# Patient Record
Sex: Female | Born: 1945 | Race: White | Hispanic: No | Smoking: Never smoker
Health system: Southern US, Community
[De-identification: ages and names within clinical notes are randomized; demographics above are authoritative.]

## PROBLEM LIST (undated history)

## (undated) DIAGNOSIS — E785 Hyperlipidemia, unspecified: Secondary | ICD-10-CM

## (undated) DIAGNOSIS — I10 Essential (primary) hypertension: Secondary | ICD-10-CM

## (undated) DIAGNOSIS — R011 Cardiac murmur, unspecified: Secondary | ICD-10-CM

## (undated) HISTORY — PX: ABDOMINAL HYSTERECTOMY: SHX81

## (undated) HISTORY — DX: Hyperlipidemia, unspecified: E78.5

## (undated) HISTORY — PX: CHOLECYSTECTOMY: SHX55

## (undated) HISTORY — PX: TOTAL KNEE ARTHROPLASTY: SHX125

## (undated) HISTORY — PX: TONSILLECTOMY: SUR1361

## (undated) HISTORY — DX: Essential (primary) hypertension: I10

## (undated) HISTORY — PX: BACK SURGERY: SHX140

## (undated) HISTORY — DX: Cardiac murmur, unspecified: R01.1

---

## 1998-01-11 ENCOUNTER — Ambulatory Visit (HOSPITAL_BASED_OUTPATIENT_CLINIC_OR_DEPARTMENT_OTHER): Admission: RE | Admit: 1998-01-11 | Discharge: 1998-01-11 | Payer: Self-pay | Admitting: Orthopedic Surgery

## 1999-10-16 ENCOUNTER — Other Ambulatory Visit: Admission: RE | Admit: 1999-10-16 | Discharge: 1999-10-16 | Payer: Self-pay | Admitting: *Deleted

## 2000-11-18 ENCOUNTER — Other Ambulatory Visit: Admission: RE | Admit: 2000-11-18 | Discharge: 2000-11-18 | Payer: Self-pay | Admitting: *Deleted

## 2001-12-13 ENCOUNTER — Other Ambulatory Visit: Admission: RE | Admit: 2001-12-13 | Discharge: 2001-12-13 | Payer: Self-pay | Admitting: *Deleted

## 2002-05-03 ENCOUNTER — Emergency Department (HOSPITAL_COMMUNITY): Admission: EM | Admit: 2002-05-03 | Discharge: 2002-05-03 | Payer: Self-pay | Admitting: Emergency Medicine

## 2003-01-30 ENCOUNTER — Other Ambulatory Visit: Admission: RE | Admit: 2003-01-30 | Discharge: 2003-01-30 | Payer: Self-pay | Admitting: *Deleted

## 2003-09-24 ENCOUNTER — Emergency Department (HOSPITAL_COMMUNITY): Admission: EM | Admit: 2003-09-24 | Discharge: 2003-09-24 | Payer: Self-pay

## 2004-06-11 ENCOUNTER — Other Ambulatory Visit: Admission: RE | Admit: 2004-06-11 | Discharge: 2004-06-11 | Payer: Self-pay | Admitting: *Deleted

## 2004-06-11 ENCOUNTER — Ambulatory Visit: Payer: Self-pay | Admitting: Internal Medicine

## 2004-07-31 ENCOUNTER — Other Ambulatory Visit: Admission: RE | Admit: 2004-07-31 | Discharge: 2004-07-31 | Payer: Self-pay | Admitting: *Deleted

## 2004-09-26 ENCOUNTER — Ambulatory Visit: Payer: Self-pay | Admitting: Internal Medicine

## 2005-02-18 ENCOUNTER — Other Ambulatory Visit: Admission: RE | Admit: 2005-02-18 | Discharge: 2005-02-18 | Payer: Self-pay | Admitting: *Deleted

## 2005-05-27 ENCOUNTER — Ambulatory Visit: Payer: Self-pay | Admitting: Internal Medicine

## 2005-06-16 ENCOUNTER — Ambulatory Visit: Payer: Self-pay | Admitting: Family Medicine

## 2005-06-18 ENCOUNTER — Other Ambulatory Visit: Admission: RE | Admit: 2005-06-18 | Discharge: 2005-06-18 | Payer: Self-pay | Admitting: *Deleted

## 2005-11-20 ENCOUNTER — Other Ambulatory Visit: Admission: RE | Admit: 2005-11-20 | Discharge: 2005-11-20 | Payer: Self-pay | Admitting: *Deleted

## 2006-06-23 ENCOUNTER — Ambulatory Visit: Payer: Self-pay | Admitting: Family Medicine

## 2006-06-23 LAB — CONVERTED CEMR LAB
BUN: 9 mg/dL (ref 6–23)
Basophils Absolute: 0.1 10*3/uL (ref 0.0–0.1)
CO2: 28 meq/L (ref 19–32)
Calcium: 9.4 mg/dL (ref 8.4–10.5)
Creatinine, Ser: 0.7 mg/dL (ref 0.4–1.2)
HDL: 36.6 mg/dL — ABNORMAL LOW (ref >39.0)
Hemoglobin: 14.4 g/dL (ref 12.0–15.0)
Lymphocytes Relative: 28.3 % (ref 12.0–46.0)
MCHC: 33.6 g/dL (ref 30.0–36.0)
Monocytes Relative: 6.4 % (ref 3.0–11.0)
Neutro Abs: 3.2 10*3/uL (ref 1.4–7.7)
Neutrophils Relative %: 61.4 % (ref 43.0–77.0)
TSH: 2.26 microintl units/mL (ref 0.35–5.50)
Triglyceride fasting, serum: 172 mg/dL — ABNORMAL HIGH (ref 0–149)

## 2007-02-10 ENCOUNTER — Encounter: Payer: Self-pay | Admitting: Family Medicine

## 2007-02-10 ENCOUNTER — Other Ambulatory Visit: Admission: RE | Admit: 2007-02-10 | Discharge: 2007-02-10 | Payer: Self-pay | Admitting: *Deleted

## 2007-08-09 ENCOUNTER — Other Ambulatory Visit: Admission: RE | Admit: 2007-08-09 | Discharge: 2007-08-09 | Payer: Self-pay | Admitting: *Deleted

## 2007-12-31 ENCOUNTER — Ambulatory Visit: Payer: Self-pay | Admitting: Family Medicine

## 2007-12-31 DIAGNOSIS — R609 Edema, unspecified: Secondary | ICD-10-CM

## 2007-12-31 DIAGNOSIS — G4700 Insomnia, unspecified: Secondary | ICD-10-CM

## 2007-12-31 DIAGNOSIS — R011 Cardiac murmur, unspecified: Secondary | ICD-10-CM

## 2008-01-03 ENCOUNTER — Encounter: Payer: Self-pay | Admitting: Family Medicine

## 2008-01-03 ENCOUNTER — Encounter (INDEPENDENT_AMBULATORY_CARE_PROVIDER_SITE_OTHER): Payer: Self-pay | Admitting: *Deleted

## 2008-01-10 ENCOUNTER — Ambulatory Visit: Payer: Self-pay

## 2008-01-10 ENCOUNTER — Encounter: Payer: Self-pay | Admitting: Family Medicine

## 2008-01-17 ENCOUNTER — Ambulatory Visit: Payer: Self-pay | Admitting: Family Medicine

## 2008-01-17 DIAGNOSIS — R079 Chest pain, unspecified: Secondary | ICD-10-CM | POA: Insufficient documentation

## 2008-01-17 DIAGNOSIS — I10 Essential (primary) hypertension: Secondary | ICD-10-CM

## 2008-01-18 ENCOUNTER — Encounter (INDEPENDENT_AMBULATORY_CARE_PROVIDER_SITE_OTHER): Payer: Self-pay | Admitting: *Deleted

## 2008-01-18 ENCOUNTER — Telehealth (INDEPENDENT_AMBULATORY_CARE_PROVIDER_SITE_OTHER): Payer: Self-pay | Admitting: *Deleted

## 2008-01-20 ENCOUNTER — Encounter (INDEPENDENT_AMBULATORY_CARE_PROVIDER_SITE_OTHER): Payer: Self-pay | Admitting: *Deleted

## 2008-01-20 LAB — CONVERTED CEMR LAB
CO2: 28 meq/L (ref 19–32)
Calcium: 9.4 mg/dL (ref 8.4–10.5)
Creatinine, Ser: 0.9 mg/dL (ref 0.4–1.2)
Glucose, Bld: 183 mg/dL — ABNORMAL HIGH (ref 70–99)
Sodium: 136 meq/L (ref 135–145)

## 2008-01-25 ENCOUNTER — Ambulatory Visit: Payer: Self-pay | Admitting: Cardiology

## 2008-01-31 ENCOUNTER — Ambulatory Visit: Payer: Self-pay

## 2008-01-31 ENCOUNTER — Encounter: Payer: Self-pay | Admitting: Family Medicine

## 2008-02-07 ENCOUNTER — Ambulatory Visit: Payer: Self-pay | Admitting: Family Medicine

## 2008-02-08 ENCOUNTER — Telehealth (INDEPENDENT_AMBULATORY_CARE_PROVIDER_SITE_OTHER): Payer: Self-pay | Admitting: *Deleted

## 2008-05-08 ENCOUNTER — Encounter (INDEPENDENT_AMBULATORY_CARE_PROVIDER_SITE_OTHER): Payer: Self-pay | Admitting: *Deleted

## 2008-05-08 ENCOUNTER — Ambulatory Visit: Payer: Self-pay | Admitting: Family Medicine

## 2008-05-08 DIAGNOSIS — R7309 Other abnormal glucose: Secondary | ICD-10-CM | POA: Insufficient documentation

## 2008-05-09 ENCOUNTER — Telehealth (INDEPENDENT_AMBULATORY_CARE_PROVIDER_SITE_OTHER): Payer: Self-pay | Admitting: *Deleted

## 2008-05-12 ENCOUNTER — Encounter (INDEPENDENT_AMBULATORY_CARE_PROVIDER_SITE_OTHER): Payer: Self-pay | Admitting: *Deleted

## 2008-05-12 LAB — CONVERTED CEMR LAB
ALT: 18 units/L (ref 0–35)
Bilirubin, Direct: 0.1 mg/dL (ref 0.0–0.3)
CO2: 29 meq/L (ref 19–32)
Calcium: 9.1 mg/dL (ref 8.4–10.5)
Direct LDL: 130.2 mg/dL
GFR calc non Af Amer: 78 mL/min
Sodium: 140 meq/L (ref 135–145)
TSH: 4.18 microintl units/mL (ref 0.35–5.50)
Total Bilirubin: 0.7 mg/dL (ref 0.3–1.2)
Total CHOL/HDL Ratio: 7
Triglycerides: 218 mg/dL (ref 0–149)
VLDL: 44 mg/dL — ABNORMAL HIGH (ref 0–40)

## 2008-06-12 ENCOUNTER — Ambulatory Visit: Payer: Self-pay | Admitting: Gastroenterology

## 2008-06-21 ENCOUNTER — Telehealth: Payer: Self-pay | Admitting: Gastroenterology

## 2008-06-22 ENCOUNTER — Encounter: Payer: Self-pay | Admitting: Family Medicine

## 2008-06-26 ENCOUNTER — Ambulatory Visit: Payer: Self-pay | Admitting: Gastroenterology

## 2008-07-25 ENCOUNTER — Telehealth: Payer: Self-pay | Admitting: Family Medicine

## 2009-01-18 ENCOUNTER — Encounter: Payer: Self-pay | Admitting: Family Medicine

## 2009-01-23 ENCOUNTER — Telehealth (INDEPENDENT_AMBULATORY_CARE_PROVIDER_SITE_OTHER): Payer: Self-pay | Admitting: *Deleted

## 2009-01-24 LAB — CONVERTED CEMR LAB: Pap Smear: NORMAL

## 2009-01-25 ENCOUNTER — Ambulatory Visit: Payer: Self-pay | Admitting: Family Medicine

## 2009-01-25 DIAGNOSIS — E1151 Type 2 diabetes mellitus with diabetic peripheral angiopathy without gangrene: Secondary | ICD-10-CM

## 2009-01-25 DIAGNOSIS — IMO0001 Reserved for inherently not codable concepts without codable children: Secondary | ICD-10-CM

## 2009-01-25 DIAGNOSIS — E1165 Type 2 diabetes mellitus with hyperglycemia: Secondary | ICD-10-CM

## 2009-01-25 LAB — CONVERTED CEMR LAB
Bilirubin Urine: NEGATIVE
Glucose, Urine, Semiquant: NEGATIVE
Specific Gravity, Urine: 1.025
pH: 6.5

## 2009-01-26 ENCOUNTER — Encounter (INDEPENDENT_AMBULATORY_CARE_PROVIDER_SITE_OTHER): Payer: Self-pay | Admitting: *Deleted

## 2009-01-29 ENCOUNTER — Encounter (INDEPENDENT_AMBULATORY_CARE_PROVIDER_SITE_OTHER): Payer: Self-pay | Admitting: *Deleted

## 2009-02-07 ENCOUNTER — Telehealth: Payer: Self-pay | Admitting: Family Medicine

## 2009-02-07 LAB — CONVERTED CEMR LAB
ALT: 27 units/L (ref 0–35)
Albumin: 4.3 g/dL (ref 3.5–5.2)
CO2: 28 meq/L (ref 19–32)
Calcium: 9.3 mg/dL (ref 8.4–10.5)
Chloride: 105 meq/L (ref 96–112)
Cholesterol: 216 mg/dL — ABNORMAL HIGH (ref 0–200)
Creatinine, Ser: 1 mg/dL (ref 0.4–1.2)
Creatinine,U: 163.3 mg/dL
Glucose, Bld: 133 mg/dL — ABNORMAL HIGH (ref 70–99)
Microalb, Ur: 2.8 mg/dL — ABNORMAL HIGH (ref 0.0–1.9)
Total Protein: 7.5 g/dL (ref 6.0–8.3)
Triglycerides: 197 mg/dL — ABNORMAL HIGH (ref 0.0–149.0)

## 2009-02-08 ENCOUNTER — Telehealth (INDEPENDENT_AMBULATORY_CARE_PROVIDER_SITE_OTHER): Payer: Self-pay | Admitting: *Deleted

## 2009-02-12 ENCOUNTER — Encounter: Payer: Self-pay | Admitting: Family Medicine

## 2009-02-13 LAB — CONVERTED CEMR LAB: Protein, Ur: 30 mg/24hr — ABNORMAL LOW (ref 50–100)

## 2009-02-14 ENCOUNTER — Encounter (INDEPENDENT_AMBULATORY_CARE_PROVIDER_SITE_OTHER): Payer: Self-pay | Admitting: *Deleted

## 2009-03-01 ENCOUNTER — Encounter: Payer: Self-pay | Admitting: Family Medicine

## 2009-03-20 ENCOUNTER — Encounter: Payer: Self-pay | Admitting: Family Medicine

## 2009-03-21 ENCOUNTER — Telehealth: Payer: Self-pay | Admitting: Family Medicine

## 2009-04-09 ENCOUNTER — Ambulatory Visit: Payer: Self-pay | Admitting: Family Medicine

## 2009-04-11 ENCOUNTER — Telehealth: Payer: Self-pay | Admitting: Family Medicine

## 2009-04-11 LAB — CONVERTED CEMR LAB
AST: 17 units/L (ref 0–37)
Albumin: 3.8 g/dL (ref 3.5–5.2)
BUN: 23 mg/dL (ref 6–23)
Calcium: 9.1 mg/dL (ref 8.4–10.5)
Creatinine,U: 174.4 mg/dL
Direct LDL: 148.4 mg/dL
GFR calc non Af Amer: 59.78 mL/min (ref >60)
Glucose, Bld: 119 mg/dL — ABNORMAL HIGH (ref 70–99)
HDL: 31.2 mg/dL — ABNORMAL LOW (ref >39.00)
Hgb A1c MFr Bld: 6.4 % (ref 4.6–6.5)
Microalb Creat Ratio: 3.4 mg/g (ref 0.0–30.0)
Potassium: 3.8 meq/L (ref 3.5–5.1)
Sodium: 143 meq/L (ref 135–145)
Total Bilirubin: 0.6 mg/dL (ref 0.3–1.2)
Triglycerides: 223 mg/dL — ABNORMAL HIGH (ref 0.0–149.0)
VLDL: 44.6 mg/dL — ABNORMAL HIGH (ref 0.0–40.0)

## 2009-04-16 ENCOUNTER — Ambulatory Visit: Payer: Self-pay | Admitting: Family Medicine

## 2009-04-16 DIAGNOSIS — E785 Hyperlipidemia, unspecified: Secondary | ICD-10-CM

## 2009-05-18 ENCOUNTER — Encounter: Payer: Self-pay | Admitting: Family Medicine

## 2009-05-25 ENCOUNTER — Encounter: Payer: Self-pay | Admitting: Family Medicine

## 2009-06-18 ENCOUNTER — Ambulatory Visit: Payer: Self-pay | Admitting: Family Medicine

## 2009-06-18 DIAGNOSIS — F418 Other specified anxiety disorders: Secondary | ICD-10-CM

## 2009-07-03 ENCOUNTER — Ambulatory Visit: Payer: Self-pay | Admitting: Family Medicine

## 2009-07-04 ENCOUNTER — Telehealth: Payer: Self-pay | Admitting: Family Medicine

## 2009-07-04 LAB — CONVERTED CEMR LAB
ALT: 16 units/L (ref 0–35)
AST: 20 units/L (ref 0–37)
Albumin: 4.1 g/dL (ref 3.5–5.2)
Alkaline Phosphatase: 50 units/L (ref 39–117)
Bilirubin, Direct: 0 mg/dL (ref 0.0–0.3)
CO2: 26 meq/L (ref 19–32)
Calcium: 9.4 mg/dL (ref 8.4–10.5)
Chloride: 102 meq/L (ref 96–112)
Glucose, Bld: 127 mg/dL — ABNORMAL HIGH (ref 70–99)
HDL: 49.1 mg/dL (ref >39.00)
Potassium: 4.5 meq/L (ref 3.5–5.1)
Sodium: 137 meq/L (ref 135–145)
Total CHOL/HDL Ratio: 4
Total Protein: 7 g/dL (ref 6.0–8.3)

## 2009-07-10 ENCOUNTER — Telehealth: Payer: Self-pay | Admitting: Family Medicine

## 2009-08-10 ENCOUNTER — Telehealth (INDEPENDENT_AMBULATORY_CARE_PROVIDER_SITE_OTHER): Payer: Self-pay | Admitting: *Deleted

## 2009-08-21 ENCOUNTER — Encounter: Payer: Self-pay | Admitting: Family Medicine

## 2009-09-03 ENCOUNTER — Ambulatory Visit: Payer: Self-pay | Admitting: Family Medicine

## 2009-09-03 DIAGNOSIS — F438 Other reactions to severe stress: Secondary | ICD-10-CM

## 2009-09-03 DIAGNOSIS — F4389 Other reactions to severe stress: Secondary | ICD-10-CM | POA: Insufficient documentation

## 2009-09-25 ENCOUNTER — Telehealth (INDEPENDENT_AMBULATORY_CARE_PROVIDER_SITE_OTHER): Payer: Self-pay | Admitting: *Deleted

## 2009-09-26 ENCOUNTER — Ambulatory Visit: Payer: Self-pay | Admitting: Family

## 2009-09-26 DIAGNOSIS — J019 Acute sinusitis, unspecified: Secondary | ICD-10-CM | POA: Insufficient documentation

## 2009-09-26 DIAGNOSIS — F411 Generalized anxiety disorder: Secondary | ICD-10-CM | POA: Insufficient documentation

## 2009-09-26 DIAGNOSIS — J329 Chronic sinusitis, unspecified: Secondary | ICD-10-CM | POA: Insufficient documentation

## 2009-10-22 ENCOUNTER — Telehealth: Payer: Self-pay | Admitting: Family Medicine

## 2009-10-29 ENCOUNTER — Ambulatory Visit: Payer: Self-pay | Admitting: Family Medicine

## 2009-10-30 ENCOUNTER — Telehealth (INDEPENDENT_AMBULATORY_CARE_PROVIDER_SITE_OTHER): Payer: Self-pay | Admitting: *Deleted

## 2009-10-30 LAB — CONVERTED CEMR LAB
AST: 17 units/L (ref 0–37)
Alkaline Phosphatase: 84 units/L (ref 39–117)
Basophils Relative: 0.7 % (ref 0.0–3.0)
Bilirubin, Direct: 0 mg/dL (ref 0.0–0.3)
CO2: 28 meq/L (ref 19–32)
Calcium: 8.9 mg/dL (ref 8.4–10.5)
Creatinine,U: 87.4 mg/dL
Glucose, Bld: 103 mg/dL — ABNORMAL HIGH (ref 70–99)
HCT: 34.2 % — ABNORMAL LOW (ref 36.0–46.0)
Hemoglobin: 11.9 g/dL — ABNORMAL LOW (ref 12.0–15.0)
Lymphocytes Relative: 26.7 % (ref 12.0–46.0)
Lymphs Abs: 1.4 10*3/uL (ref 0.7–4.0)
MCHC: 34.9 g/dL (ref 30.0–36.0)
Microalb Creat Ratio: 11.4 mg/g (ref 0.0–30.0)
Monocytes Relative: 6.7 % (ref 3.0–12.0)
Neutro Abs: 3.4 10*3/uL (ref 1.4–7.7)
Potassium: 4.1 meq/L (ref 3.5–5.1)
RBC: 3.81 M/uL — ABNORMAL LOW (ref 3.87–5.11)
Sodium: 140 meq/L (ref 135–145)
Total CHOL/HDL Ratio: 5
Total Protein: 6.7 g/dL (ref 6.0–8.3)
Triglycerides: 103 mg/dL (ref 0.0–149.0)

## 2009-10-31 ENCOUNTER — Telehealth: Payer: Self-pay | Admitting: Family Medicine

## 2009-11-26 ENCOUNTER — Ambulatory Visit: Payer: Self-pay | Admitting: Family Medicine

## 2010-01-02 ENCOUNTER — Telehealth: Payer: Self-pay | Admitting: Family Medicine

## 2010-01-02 ENCOUNTER — Ambulatory Visit: Payer: Self-pay | Admitting: Family Medicine

## 2010-03-29 ENCOUNTER — Ambulatory Visit: Payer: Self-pay | Admitting: Family Medicine

## 2010-04-29 ENCOUNTER — Telehealth (INDEPENDENT_AMBULATORY_CARE_PROVIDER_SITE_OTHER): Payer: Self-pay | Admitting: *Deleted

## 2010-04-30 ENCOUNTER — Ambulatory Visit: Payer: Self-pay | Admitting: Family Medicine

## 2010-06-19 ENCOUNTER — Ambulatory Visit: Payer: Self-pay | Admitting: Family Medicine

## 2010-06-27 ENCOUNTER — Encounter: Payer: Self-pay | Admitting: Family Medicine

## 2010-06-28 LAB — CONVERTED CEMR LAB
ALT: 15 units/L (ref 0–35)
AST: 16 units/L (ref 0–37)
BUN: 26 mg/dL — ABNORMAL HIGH (ref 6–23)
Calcium: 9.1 mg/dL (ref 8.4–10.5)
Cholesterol: 263 mg/dL — ABNORMAL HIGH (ref 0–200)
GFR calc non Af Amer: 52.79 mL/min — ABNORMAL LOW (ref >60.00)
Glucose, Bld: 146 mg/dL — ABNORMAL HIGH (ref 70–99)
HDL: 39.8 mg/dL (ref >39.00)
Hgb A1c MFr Bld: 6.7 % — ABNORMAL HIGH (ref 4.6–6.5)
Potassium: 4.4 meq/L (ref 3.5–5.1)
Sodium: 140 meq/L (ref 135–145)
Total Bilirubin: 0.4 mg/dL (ref 0.3–1.2)
Total Protein: 6.5 g/dL (ref 6.0–8.3)
Triglycerides: 189 mg/dL — ABNORMAL HIGH (ref 0.0–149.0)

## 2010-08-15 NOTE — Assessment & Plan Note (Signed)
Summary: ONE MTH FU/KDC   Vital Signs:  Patient profile:   65 year old female Weight:      216 pounds Pulse rate:   68 / minute Pulse rhythm:   regular BP sitting:   112 / 76  (left arm) Cuff size:   large  Vitals Entered By: Allyn Kenner CMA (October 29, 2009 9:37 AM) CC: Pt here for 1 month follow up, is stopping the Prozac going back to Celexa., Type 2 diabetes mellitus follow-up   CC:  Pt here for 1 month follow up, is stopping the Prozac going back to Celexa., and Type 2 diabetes mellitus follow-up.  History of Present Illness: Pt here f/u depression---  she likes the celexa better and would like to switch back.   Hyperlipidemia Follow-Up      This is a 65 year old woman who presents for Hyperlipidemia follow-up.  The patient reports muscle aches, but denies GI upset, abdominal pain, flushing, itching, constipation, diarrhea, and fatigue.  The patient denies the following symptoms: chest pain/pressure, exercise intolerance, dypsnea, palpitations, syncope, and pedal edema.  Compliance with medications (by patient report) has been intermittent.  Dietary compliance has been fair.  The patient reports exercising occasionally.    Hypertension Follow-Up      The patient also presents for Hypertension follow-up.  The patient denies lightheadedness, urinary frequency, headaches, edema, impotence, rash, and fatigue.  The patient denies the following associated symptoms: chest pain, chest pressure, exercise intolerance, dyspnea, palpitations, syncope, leg edema, and pedal edema.  Compliance with medications (by patient report) has been sporadic.  The patient reports that dietary compliance has been fair.  The patient reports exercising occasionally.  Adjunctive measures currently used by the patient include salt restriction.    Type 2 Diabetes Mellitus Follow-Up      The patient is also here for Type 2 diabetes mellitus follow-up.  The patient denies polyuria, polydipsia, blurred vision,  self managed hypoglycemia, hypoglycemia requiring help, weight loss, weight gain, and numbness of extremities.  The patient denies the following symptoms: neuropathic pain, chest pain, vomiting, orthostatic symptoms, poor wound healing, intermittent claudication, vision loss, and foot ulcer.  Since the last visit the patient reports compliance with medications, not exercising regularly, and monitoring blood glucose.  Since the last visit, the patient reports having had eye care by an ophthalmologist and foot care by a podiatrist.    Allergies (verified): No Known Drug Allergies  Past History:  Past medical, surgical, family and social histories (including risk factors) reviewed for relevance to current acute and chronic problems.  Past Medical History: Reviewed history from 04/16/2009 and no changes required. Current Problems:  MURMUR (ICD-785 Current Problems:  HYPERGLYCEMIA, FASTING (ICD-790.29) CHEST PAIN (ICD-786.50) ESSENTIAL HYPERTENSION, BENIGN (ICD-401.1) EDEMA (ICD-782.3) INSOMNIA (ICD-780.52) MURMUR (ICD-785.2) Diabetes mellitus, type II Hyperlipidemia  Past Surgical History: Reviewed history from 06/18/2009 and no changes required. Total knee replacement  Family History: Reviewed history from 05/08/2008 and no changes required. Family History of Melanoma B-- cancer throat Family History of Arthritis F--cirrhosis of liver--etoh M--died when pt wass 65yo   Social History: Reviewed history from 05/08/2008 and no changes required. Married Drug use-no Regular exercise-yes Never Smoked Alcohol use-yes Occupation:  [employer], commercial  Review of Systems      See HPI  Physical Exam  General:  Well-developed,well-nourished,in no acute distress; alert,appropriate and cooperative throughout examination Ears:  External ear exam shows no significant lesions or deformities.  Otoscopic examination reveals clear canals, tympanic membranes are  intact  bilaterally without bulging, retraction, inflammation or discharge. Hearing is grossly normal bilaterally. Neck:  No deformities, masses, or tenderness noted. Lungs:  Normal respiratory effort, chest expands symmetrically. Lungs are clear to auscultation, no crackles or wheezes. Heart:  normal rate and Grade   2/6 systolic ejection murmur.    Diabetes Management Exam:    Foot Exam (with socks and/or shoes not present):       Sensory-Pinprick/Light touch:          Left medial foot (L-4): normal          Left dorsal foot (L-5): normal          Left lateral foot (S-1): normal          Right medial foot (L-4): normal          Right dorsal foot (L-5): normal          Right lateral foot (S-1): normal       Sensory-Monofilament:          Left foot: normal          Right foot: diminished       Inspection:          Left foot: normal          Right foot: normal       Nails:          Left foot: normal          Right foot: thickened    Eye Exam:       Eye Exam done elsewhere   Impression & Recommendations:  Problem # 1:  DEPRESSION (ICD-311)  The following medications were removed from the medication list:    Fluoxetine Hcl 20 Mg Caps (Fluoxetine hcl) ..... One tablet by mouth daily Her updated medication list for this problem includes:    Celexa 20 Mg Tabs (Citalopram hydrobromide) .Marland Kitchen... 1 by mouth once daily    Ativan 0.5 Mg Tabs (Lorazepam) .Marland Kitchen... 1 by mouth three times a day as needed  Problem # 2:  HYPERLIPIDEMIA (ICD-272.4)  Her updated medication list for this problem includes:    Fenofibrate 160 Mg Tabs (Fenofibrate) .Marland Kitchen... 1 by mouth once daily.    Lipitor 20 Mg Tabs (Atorvastatin calcium) .Marland Kitchen... Take 1 by mouth  at bedtime  Orders: Venipuncture [id number]) TLB-Lipid Panel (80061-LIPID) TLB-BMP (Basic Metabolic Panel-BMET) (99991111) TLB-Hepatic/Liver Function Pnl (80076-HEPATIC) TLB-A1C / Hgb A1C (Glycohemoglobin) (83036-A1C) TLB-Microalbumin/Creat Ratio, Urine  (82043-MALB)  Problem # 3:  DIABETES MELLITUS, TYPE II (ICD-250.00)  Her updated medication list for this problem includes:    Lisinopril-hydrochlorothiazide 10-12.5 Mg Tabs (Lisinopril-hydrochlorothiazide) .Marland Kitchen... 1 by mouth once daily    Metformin Hcl 500 Mg Tabs (Metformin hcl) .Marland Kitchen... Take one tablet twice daily.    Aspirin 325 Mg Tabs (Aspirin) .Marland Kitchen... Daily.  Orders: Venipuncture [id number]) TLB-Lipid Panel (80061-LIPID) TLB-BMP (Basic Metabolic Panel-BMET) (99991111) TLB-Hepatic/Liver Function Pnl (80076-HEPATIC) TLB-A1C / Hgb A1C (Glycohemoglobin) (83036-A1C) TLB-Microalbumin/Creat Ratio, Urine (82043-MALB)  Problem # 4:  ESSENTIAL HYPERTENSION, BENIGN (ICD-401.1)  Her updated medication list for this problem includes:    Lisinopril-hydrochlorothiazide 10-12.5 Mg Tabs (Lisinopril-hydrochlorothiazide) .Marland Kitchen... 1 by mouth once daily  Orders: Venipuncture [id number]) TLB-Lipid Panel (80061-LIPID) TLB-BMP (Basic Metabolic Panel-BMET) (99991111) TLB-Hepatic/Liver Function Pnl (80076-HEPATIC) TLB-A1C / Hgb A1C (Glycohemoglobin) (83036-A1C) TLB-Microalbumin/Creat Ratio, Urine (82043-MALB)  Complete Medication List: 1)  Lisinopril-hydrochlorothiazide 10-12.5 Mg Tabs (Lisinopril-hydrochlorothiazide) .Marland Kitchen.. 1 by mouth once daily 2)  Metformin Hcl 500 Mg Tabs (Metformin hcl) .... Take one tablet twice daily. 3)  Freestyle Lite Test Strp (  Glucose blood) .... Use one strip twice daily 4)  Freestyle Unistick Ii Lancets Misc (Lancets) .... Use one lancet twice daily. 5)  Fenofibrate 160 Mg Tabs (Fenofibrate) .Marland Kitchen.. 1 by mouth once daily. 6)  Lipitor 20 Mg Tabs (Atorvastatin calcium) .... Take 1 by mouth  at bedtime 7)  Hydrocodone-acetaminophen 5-325 Mg Tabs (Hydrocodone-acetaminophen) .... Take 1-2 tablets at bedtime as needed. 8)  Aspirin 325 Mg Tabs (Aspirin) .... Daily. 9)  Celexa 20 Mg Tabs (Citalopram hydrobromide) .Marland Kitchen.. 1 by mouth once daily 10)  Ativan 0.5 Mg Tabs (Lorazepam) .Marland Kitchen.. 1 by  mouth three times a day as needed  Other Orders: TLB-CBC Platelet - w/Differential (85025-CBCD) TLB-TSH (Thyroid Stimulating Hormone) (84443-TSH) Prescriptions: ATIVAN 0.5 MG TABS (LORAZEPAM) 1 by mouth three times a day as needed  #60 x 0   Entered and Authorized by:   Garnet Koyanagi DO   Signed by:   Garnet Koyanagi DO on 10/29/2009   Method used:   Print then Give to Patient   RxID:   [id number] CELEXA 20 MG TABS (CITALOPRAM HYDROBROMIDE) 1 by mouth once daily  #30 x 5   Entered and Authorized by:   Garnet Koyanagi DO   Signed by:   Garnet Koyanagi DO on 10/29/2009   Method used:   Print then Mail to Patient   RxID:   [id number]

## 2010-08-15 NOTE — Assessment & Plan Note (Signed)
Summary: SHINGLES SHOT////SPH   Nurse Visit   Allergies: No Known Drug Allergies  Immunizations Administered:  Zostavax # 1:    Vaccine Type: Zostavax    Site: right deltoid    Mfr: Merck    Dose: 0.5 ml    Route: [place]    Given by: Allyn Kenner CMA    Exp. Date: 02/06/2011    Lot #: [id number]  Orders Added: 1)  Zoster (Shingles) Vaccine Live [id number] 2)  Admin 1st Vaccine [id number]

## 2010-08-15 NOTE — Assessment & Plan Note (Signed)
Summary: cpx//lch   Vital Signs:  Patient profile:   65 year old female Height:      62 inches Weight:      220 pounds Pulse rate:   70 / minute Pulse rhythm:   regular BP sitting:   118 / 80  (left arm) Cuff size:   large  Vitals Entered By: [place] (Nov 26, 2009 12:53 PM) CC: Pt here for CPX, no pap   History of Present Illness: Pt here for cpe--no pap.   Pt sees gyn in [place].    No complaints.    Hyperlipidemia follow-up      This is a 65 year old woman who presents for Hyperlipidemia follow-up.  The patient denies muscle aches, GI upset, abdominal pain, flushing, itching, constipation, diarrhea, and fatigue.  The patient denies the following symptoms: chest pain/pressure, exercise intolerance, dypsnea, palpitations, syncope, and pedal edema.  Compliance with medications (by patient report) has been near 100%.  Dietary compliance has been good.  The patient reports exercising occasionally.  Adjunctive measures currently used by the patient include ASA, fish oil supplements, limiting alcohol consumpton, and weight reduction.    Hypertension follow-up      The patient also presents for Hypertension follow-up.  The patient denies lightheadedness, urinary frequency, headaches, edema, impotence, rash, and fatigue.  The patient denies the following associated symptoms: chest pain, chest pressure, exercise intolerance, dyspnea, palpitations, syncope, leg edema, and pedal edema.  Compliance with medications (by patient report) has been near 100%.  The patient reports that dietary compliance has been good.  The patient reports exercising occasionally.  Adjunctive measures currently used by the patient include salt restriction.    Type 1 diabetes mellitus follow-up      The patient is also here for Type 2 diabetes mellitus follow-up.  The patient denies polyuria, polydipsia, blurred vision, self managed hypoglycemia, hypoglycemia requiring help, weight loss, weight gain, and  numbness of extremities.  The patient denies the following symptoms: neuropathic pain, chest pain, vomiting, orthostatic symptoms, poor wound healing, intermittent claudication, vision loss, and foot ulcer.  Since the last visit the patient reports good dietary compliance, compliance with medications, not exercising regularly, and monitoring blood glucose.  The patient has been measuring capillary blood glucose before breakfast.  Since the last visit, the patient reports having had eye care by an ophthalmologist and foot care by a podiatrist.    Preventive Screening-Counseling & Management  Alcohol-Tobacco     Alcohol drinks/day: <1     Smoking Status: never  Caffeine-Diet-Exercise     Caffeine use/day: 0     Diet Comments: ADA     Does Patient Exercise: yes  Hep-HIV-STD-Contraception     Dental Visit-last 6 months yes     Dental Care Counseling: not indicated; dental care within six months     SBE monthly: yes     SBE Education/Counseling: not indicated; SBE done regularly      Sexual History:  currently monogamous.        Drug Use:  never.    Current Medications (verified): 1)  Lisinopril-Hydrochlorothiazide 10-12.5 Mg  Tabs (Lisinopril-Hydrochlorothiazide) .Marland Kitchen.. 1 By Mouth Once Daily 2)  Metformin Hcl 500 Mg Tabs (Metformin Hcl) .... Take One Tablet Twice Daily. 3)  Freestyle Lite Test  Strp (Glucose Blood) .... Use One Strip Twice Daily 4)  Freestyle Unistick Ii Lancets  Misc (Lancets) .... Use One Lancet Twice Daily. 5)  Lipitor 20 Mg Tabs (Atorvastatin Calcium) .Marland Kitchen.. 1 By Mouth  At Bedtime 6)  Hydrocodone-Acetaminophen 5-325 Mg Tabs (Hydrocodone-Acetaminophen) .... Take 1-2 Tablets At Bedtime As Needed. 7)  Aspirin 325 Mg Tabs (Aspirin) .... Daily. 8)  Celexa 20 Mg Tabs (Citalopram Hydrobromide) .Marland Kitchen.. 1 By Mouth Once Daily 9)  Ativan 0.5 Mg Tabs (Lorazepam) .Marland Kitchen.. 1 By Mouth Three Times A Day As Needed 10)  Elocon 0.1 % Crea (Mometasone Furoate) .... Apply Once Daily  Allergies  (verified): No Known Drug Allergies  Past History:  Past Medical History: Last updated: 04/16/2009 Current Problems:  MURMUR (ICD-785 Current Problems:  HYPERGLYCEMIA, FASTING (ICD-790.29) CHEST PAIN (ICD-786.50) ESSENTIAL HYPERTENSION, BENIGN (ICD-401.1) EDEMA (ICD-782.3) INSOMNIA (ICD-780.52) MURMUR (ICD-785.2) Diabetes mellitus, type II Hyperlipidemia  Past Surgical History: Last updated: 06/18/2009 Total knee replacement  Family History: Last updated: 05/08/2008 Family History of Melanoma B-- cancer throat Family History of Arthritis F--cirrhosis of liver--etoh M--died when pt wass 65yo   Social History: Last updated: 05/08/2008 Married Drug use-no Regular exercise-yes Never Smoked Alcohol use-yes 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 Family History of Melanoma B-- cancer throat Family History of Arthritis F--cirrhosis of liver--etoh M--died when pt wass 65yo   Social History: Reviewed history from 05/08/2008 and no changes required. 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 Eyes:  Denies blurring, discharge, double vision, eye irritation, eye pain, halos, itching, light sensitivity, red eye, vision loss-1 eye, and vision loss-both eyes; optho--q1y. ENT:  Denies decreased hearing, difficulty swallowing, ear discharge, earache, hoarseness, nasal congestion, nosebleeds, postnasal  drainage, ringing in ears, sinus pressure, and sore throat. CV:  Denies bluish discoloration of lips or nails, chest pain or discomfort, difficulty breathing at night, difficulty breathing while lying down, fainting, fatigue, leg cramps with exertion, lightheadness, near fainting, palpitations, shortness of breath with exertion, swelling of feet, swelling of hands, and weight gain. Resp:  Denies chest discomfort, chest pain with inspiration, cough, coughing up blood, excessive snoring, hypersomnolence, morning headaches, pleuritic, shortness of breath, sputum productive, and wheezing. GI:  Denies abdominal pain, bloody stools, change in bowel habits, constipation, dark tarry stools, diarrhea, excessive appetite, gas, hemorrhoids, indigestion, loss of appetite, and nausea. GU:  Denies abnormal vaginal bleeding, decreased libido, discharge, dysuria, genital sores, hematuria, incontinence, nocturia, urinary frequency, and urinary hesitancy. MS:  Denies joint pain, joint redness, joint swelling, loss of strength, low back pain, mid back pain, muscle aches, muscle , cramps, muscle weakness, stiffness, and thoracic pain. Derm:  Denies changes in color of skin, changes in nail beds, dryness, excessive perspiration, flushing, hair loss, insect bite(s), itching, lesion(s), poor wound healing, and rash. Neuro:  Denies brief paralysis, difficulty with concentration, disturbances in coordination, falling down, headaches, inability to speak, memory loss, numbness, poor balance, seizures, sensation of room spinning, tingling, tremors, visual disturbances, and weakness. Psych:  Denies alternate hallucination ( auditory/visual), anxiety, depression, easily angered, easily tearful, irritability, mental problems, panic attacks, sense of great danger, suicidal thoughts/plans, thoughts of violence, unusual visions or sounds, and thoughts /plans of harming others. Endo:  Denies cold intolerance, excessive hunger, excessive  thirst, excessive  urination, heat intolerance, polyuria, and weight change. Heme:  Denies abnormal bruising, bleeding, enlarge lymph nodes, fevers, pallor, and skin discoloration. Allergy:  Denies hives or rash, itching eyes, persistent infections, seasonal allergies, and sneezing.  Physical Exam  General:  Well-developed,well-nourished,in no acute distress; alert,appropriate and cooperative throughout examination Head:  Normocephalic and atraumatic without obvious abnormalities. No apparent alopecia or balding. Eyes:  pupils equal, pupils round, pupils reactive to light, pupils react to accomodation, corneas and lenses clear, and no injection.   Ears:  External ear exam shows no significant lesions or deformities.  Otoscopic examination reveals clear canals, tympanic membranes are intact bilaterally without bulging, retraction, inflammation or discharge. Hearing is grossly normal bilaterally. Nose:  External nasal examination shows no deformity or inflammation. Nasal mucosa are pink and moist without lesions or exudates. Mouth:  Oral mucosa and oropharynx without lesions or exudates.  Teeth in good repair. Neck:  No deformities, masses, or tenderness noted. Chest Wall:  No deformities, masses, or tenderness noted. Breasts:  gyn Lungs:  Normal respiratory effort, chest expands symmetrically. Lungs are clear to auscultation, no crackles or wheezes. Heart:  normal rate and no murmur.   Abdomen:  Bowel sounds positive,abdomen soft and non-tender without masses, organomegaly or hernias noted. Rectal:  gyn Genitalia:  gyn Msk:  normal ROM, no joint tenderness, no joint swelling, no joint warmth, no redness over joints, no joint deformities, no joint instability, and no crepitation.   Pulses:  R posterior tibial normal, R dorsalis pedis normal, R carotid normal, L posterior tibial normal, L dorsalis pedis normal, and L carotid normal.   Extremities:  No clubbing, cyanosis, edema, or deformity noted  with normal full range of motion of all joints.   Neurologic:  No cranial nerve deficits noted. Station and gait are normal. Plantar reflexes are down-going bilaterally. DTRs are symmetrical throughout. Sensory, motor and coordinative functions appear intact. Skin:  Intact without suspicious lesions or rashes Cervical Nodes:  No lymphadenopathy noted Axillary Nodes:  No palpable lymphadenopathy Psych:  Oriented X3, normally interactive, good eye contact, not anxious appearing, and not depressed appearing.    Diabetes Management Exam:    Foot Exam (with socks and/or shoes not present):       Sensory-Pinprick/Light touch:          Left medial foot (L-4): normal          Left dorsal foot (L-5): normal          Left lateral foot (S-1): normal          Right medial foot (L-4): normal          Right dorsal foot (L-5): normal          Right lateral foot (S-1): normal       Sensory-Monofilament:          Left foot: normal          Right foot: normal       Inspection:          Left foot: normal          Right foot: normal       Nails:          Left foot: normal          Right foot: normal    Eye Exam:       Eye Exam done elsewhere   Impression & Recommendations:  Problem # 1:  PREVENTIVE HEALTH CARE (ICD-V70.0) labs reviewed ghm utd Orders: Venipuncture [id number]) EKG w/  Interpretation (93000)  Problem # 2:  ANXIETY (ICD-300.00)  Her updated medication list for this problem includes:    Celexa 20 Mg Tabs (Citalopram hydrobromide) .Marland Kitchen... 1 by mouth once daily    Ativan 0.5 Mg Tabs (Lorazepam) .Marland Kitchen... 1 by mouth three times a day as needed  Discussed medication use and relaxation techniques.   Problem # 3:  HYPERLIPIDEMIA (B2193296.4)  The following medications were removed from the medication list:    Fenofibrate 160 Mg Tabs (Fenofibrate) .Marland Kitchen... 1 by mouth once daily. Her updated medication list for this problem includes:    Lipitor 20 Mg Tabs (Atorvastatin calcium) .Marland Kitchen... 1 by mouth  at bedtime  Orders: Venipuncture [id number]) EKG w/ Interpretation (93000)  Labs Reviewed: SGOT: 17 (10/29/2009)   SGPT: 15 (10/29/2009)   HDL:42.20 (10/29/2009), 49.10 (07/03/2009)  LDL:120 (07/03/2009), DEL (05/08/2008)  Chol:226 (10/29/2009), 193 (07/03/2009)  Trig:103.0 (10/29/2009), 119.0 (07/03/2009)  Problem # 4:  DIABETES MELLITUS, TYPE II (ICD-250.00)  Her updated medication list for this problem includes:    Lisinopril-hydrochlorothiazide 10-12.5 Mg Tabs (Lisinopril-hydrochlorothiazide) .Marland Kitchen... 1 by mouth once daily    Metformin Hcl 500 Mg Tabs (Metformin hcl) .Marland Kitchen... Take one tablet twice daily.    Aspirin 325 Mg Tabs (Aspirin) .Marland Kitchen... Daily.  Orders: Venipuncture [id number]) EKG w/ Interpretation (93000)  Labs Reviewed: Creat: 1.0 (10/29/2009)    Reviewed HgBA1c results: 6.3 (10/29/2009)  6.4 (07/03/2009)  Problem # 5:  ESSENTIAL HYPERTENSION, BENIGN (ICD-401.1)  Her updated medication list for this problem includes:    Lisinopril-hydrochlorothiazide 10-12.5 Mg Tabs (Lisinopril-hydrochlorothiazide) .Marland Kitchen... 1 by mouth once daily  Orders: Venipuncture [id number]) EKG w/ Interpretation (93000)  BP today: 118/80 Prior BP: 112/76 (10/29/2009)  Labs Reviewed: K+: 4.1 (10/29/2009) Creat: : 1.0 (10/29/2009)   Chol: 226 (10/29/2009)   HDL: 42.20 (10/29/2009)   LDL: 120 (07/03/2009)   TG: 103.0 (10/29/2009)  Complete Medication List: 1)  Lisinopril-hydrochlorothiazide 10-12.5 Mg Tabs (Lisinopril-hydrochlorothiazide) .Marland Kitchen.. 1 by mouth once daily 2)  Metformin Hcl 500 Mg Tabs (Metformin hcl) .... Take one tablet twice daily. 3)  Freestyle Lite Test Strp (Glucose blood) .... Use one strip twice daily 4)  Freestyle Unistick Ii Lancets Misc (Lancets) .... Use one lancet twice daily. 5)  Lipitor 20 Mg Tabs (Atorvastatin calcium) .Marland Kitchen.. 1 by mouth at bedtime 6)  Hydrocodone-acetaminophen 5-325 Mg Tabs (Hydrocodone-acetaminophen) .... Take 1-2 tablets at bedtime as needed. 7)  Aspirin 325 Mg Tabs  (Aspirin) .... Daily. 8)  Celexa 20 Mg Tabs (Citalopram hydrobromide) .Marland Kitchen.. 1 by mouth once daily 9)  Ativan 0.5 Mg Tabs (Lorazepam) .Marland Kitchen.. 1 by mouth three times a day as needed 10)  Elocon 0.1 % Crea (Mometasone furoate) .... Apply once daily  Other Orders: Tdap => 52yrs IM [id number]) Admin 1st Vaccine [id number])  Patient Instructions: 1)  rto 6 months Prescriptions: LIPITOR 20 MG TABS (ATORVASTATIN CALCIUM) 1 by mouth at bedtime  #30 x 0   Entered and Authorized by:   Garnet Koyanagi DO   Signed by:   Garnet Koyanagi DO on 11/26/2009   Method used:   Print then Give to Patient   RxID:   [id number] LIPITOR 20 MG TABS (ATORVASTATIN CALCIUM) 1 by mouth at bedtime  #90 x 3   Entered and Authorized by:   Garnet Koyanagi DO   Signed by:   Garnet Koyanagi DO on 11/26/2009   Method used:   Print then Give to Patient   RxID:   [id number] ELOCON 0.1 % CREA (MOMETASONE FUROATE) apply once daily  #45 x 2  Entered and Authorized by:   Garnet Koyanagi DO   Signed by:   Garnet Koyanagi DO on 11/26/2009   Method used:   [facility] ...       [facility]  [address]  [id number] ([facility]       [id number]       [place], [place]  [address]       Ph: [phone] or [phone]       Fax: [phone]   RxID:   [id number] LISINOPRIL-HYDROCHLOROTHIAZIDE 10-12.5 MG  TABS (LISINOPRIL-HYDROCHLOROTHIAZIDE) 1 by mouth once daily  #90 x 3   Entered and Authorized by:   Garnet Koyanagi DO   Signed by:   Garnet Koyanagi DO on 11/26/2009   Method used:   [facility]ed to ...       [facility]  [address]  [phone] (retail)       [address]       [place],   [address]       Ph: [phone] or [phone]       Fax: [phone]   RxID:   [id number]    EKG  Procedure date:  11/26/2009  Findings:      Normal sinus rhythm with rate of:  64 bpm   Immunizations Administered:  Tetanus Vaccine:    Vaccine Type: Tdap    Site: right deltoid    Mfr: GlaxoSmithKline    Dose: 0.5 ml    Route: IM    Given by: Allyn Kenner CMA    Exp. Date:  10/06/2011    Lot #: [id number]    PAP Result Date:  01/24/2009 PAP Result:  normal PAP Next Due:  1 yr Mammogram Result Date:  08/30/2009 Mammogram Result:  normal Mammogram Next Due:  1 yr   Immunizations Administered:  Tetanus Vaccine:    Vaccine Type: Tdap    Site: right deltoid    Mfr: GlaxoSmithKline    Dose: 0.5 ml    Route: IM    Given by: Allyn Kenner CMA    Exp. Date: 10/06/2011    Lot #: [id number]

## 2010-08-15 NOTE — Progress Notes (Signed)
Summary: Refill Request  Phone Note Refill Request Message from:  [facility] [address] [facility]ax #: [phone]  Refills Requested: Medication #1:  ATIVAN 0.5 MG TABS 1 by mouth three times a day as needed   Dosage confirmed as above?Dosage Confirmed   Brand Name Necessary? No   Supply Requested: 1 month   Last Refilled: 09/03/2009 Next Appointment Scheduled: 4.18.11 Initial call taken by: Elna Breslow,  October 22, 2009 8:33 AM  Follow-up for Phone Call        last ov 09/26/08 acute. [place]  October 22, 2009 8:36 AM   Additional Follow-up for Phone Call Additional follow up Details #1::        ok to refill Additional Follow-up by: Garnet Koyanagi DO,  October 22, 2009 9:18 AM    Prescriptions: ATIVAN 0.5 MG TABS (LORAZEPAM) 1 by mouth three times a day as needed  #60 x 0   Entered by:   Allyn Kenner CMA   Authorized by:   Garnet Koyanagi DO   Signed by:   Allyn Kenner CMA on 10/22/2009   Method used:   Printed then faxed to ...       [facility]  [address]  [phone] (retail[facility]       [address]       [place],   [address]       Ph: [phone] or [phone]       Fax: [phone]   RxID:   [id number]

## 2010-08-15 NOTE — Progress Notes (Signed)
Summary: crestor  Phone Note Call from Patient   Caller: Patient Summary of Call: Pt called and she states she does not want to take the Lipitor because it was bothering her legs. She is going to the crestor 10 mg every other day. Allyn Kenner CMA  October 31, 2009 4:44 PM   Follow-up for Phone Call        ok Follow-up by: Garnet Koyanagi DO,  October 31, 2009 9:50 PM    New/Updated Medications: CRESTOR 10 MG TABS (ROSUVASTATIN CALCIUM) 1 by mouth every day.

## 2010-08-15 NOTE — Assessment & Plan Note (Signed)
Summary: ANXIETY,PANIC ATTACKS/RH.....   Vital Signs:  Patient profile:   65 year old female Weight:      205 pounds Temp:     98.7 degrees F oral Pulse rate:   82 / minute Pulse rhythm:   regular BP sitting:   124 / 82  (left arm) Cuff size:   large  Vitals Entered By: [place] (September 03, 2009 1:24 PM) CC: Pt c/o wanting to cry a lot, not able to sleep. x 2-3 months getting worse.    History of Present Illness: Pt here c/o panic attacks and anxiety since having knee replacement.  Pt is not sleeping and is crying alot.  She is exhausted.  Pt is not suicidal.  Current Medications (verified): 1)  Lisinopril-Hydrochlorothiazide 10-12.5 Mg  Tabs (Lisinopril-Hydrochlorothiazide) .Marland Kitchen.. 1 By Mouth Once Daily 2)  Metformin Hcl 500 Mg Tabs (Metformin Hcl) .... Take One Tablet Twice Daily. 3)  Freestyle Lite Test  Strp (Glucose Blood) .... Use One Strip Twice Daily 4)  Freestyle Unistick Ii Lancets  Misc (Lancets) .... Use One Lancet Twice Daily. 5)  Fenofibrate 160 Mg Tabs (Fenofibrate) .Marland Kitchen.. 1 By Mouth Once Daily. 6)  Lipitor 20 Mg Tabs (Atorvastatin Calcium) .... Take 1 By Mouth  At Bedtime 7)  Hydrocodone-Acetaminophen 5-325 Mg Tabs (Hydrocodone-Acetaminophen) .... Take 1-2 Tablets At Bedtime As Needed. 8)  Aspirin 325 Mg Tabs (Aspirin) .... Daily.  Allergies (verified): No Known Drug Allergies  Past History:  Past Medical History: Last updated: 04/16/2009 Current Problems:  MURMUR (ICD-785 Current Problems:  HYPERGLYCEMIA, FASTING (ICD-790.29) CHEST PAIN (ICD-786.50) ESSENTIAL HYPERTENSION, BENIGN (ICD-401.1) EDEMA (ICD-782.3) INSOMNIA (ICD-780.52) MURMUR (ICD-785.2) Diabetes mellitus, type II Hyperlipidemia  Past Surgical History: Last updated: 06/18/2009 Total knee replacement  Family History: Last updated: 05/08/2008 Family History of Melanoma B-- cancer throat Family History of Arthritis F--cirrhosis of liver--etoh M--died when pt wass 65yo    Social History: Last updated: 05/08/2008 Married Drug use-no Regular exercise-yes Never Smoked Alcohol use-yes 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     Impression & Recommendations:  Problem # 1:  OTHER ACUTE REACTIONS TO STRESS (ICD-308.3) celexa 20 mg  1/2 by mouth once daily for 8 days then 1 by mouth once daily ativan 0.5 mg 1 by mouth three x a day as needed rto 1 month pt here 15 min and > 50% face to face  Complete Medication List: 1)  Lisinopril-hydrochlorothiazide 10-12.5 Mg Tabs (Lisinopril-hydrochlorothiazide) .Marland Kitchen.. 1 by mouth once daily 2)  Metformin Hcl 500 Mg Tabs (Metformin hcl) .... Take one tablet twice daily. 3)  Freestyle Lite Test Strp (Glucose blood) .... Use one strip twice daily 4)  Freestyle Unistick Ii Lancets Misc (Lancets) .... Use one lancet twice daily. 5)  Fenofibrate 160 Mg Tabs (Fenofibrate) .Marland Kitchen.. 1 by mouth once daily. 6)  Lipitor 20 Mg Tabs (Atorvastatin calcium) .... Take 1 by mouth  at bedtime 7)  Hydrocodone-acetaminophen 5-325 Mg Tabs (Hydrocodone-acetaminophen) .... Take 1-2 tablets at bedtime as needed. 8)  Aspirin 325 Mg Tabs (Aspirin) .... Daily. 9)  Celexa 20 Mg Tabs (Citalopram hydrobromide) .Marland Kitchen.. 1 by mouth once daily 10)  Ativan 0.5 Mg Tabs (Lorazepam) .Marland Kitchen.. 1 by mouth three times a day as needed  Patient Instructions: 1)  rto 1 month Prescriptions: ATIVAN 0.5 MG TABS (LORAZEPAM) 1 by mouth three times a day as needed  #60 x 0   Entered and Authorized by:   Garnet Koyanagi DO   Signed by:   Garnet Koyanagi DO on 09/03/2009   Method used:   Print then  Give to Patient   RxID:   [id number] CELEXA 20 MG TABS (CITALOPRAM HYDROBROMIDE) 1 by mouth once daily  #30 x 2   Entered and Authorized by:   Garnet Koyanagi DO   Signed by:   Garnet Koyanagi DO on 09/03/2009   Method used:   [facility] ...       [facility]  [address]  [phone] ([facility]ail[facility]       [address]       [place], [place]  [address]       Ph: [phone] or [phone]       Fax: [phone]   RxID:   [id number]

## 2010-08-15 NOTE — Medication Information (Signed)
Summary: [facility] Regarding [facility]  [facility]   Imported By: Edmonia James 07/09/2010 09:25:29  _____________________________________________________________________  External Attachment:    Type:   Image     Comment:   External Document

## 2010-08-15 NOTE — Progress Notes (Signed)
Summary: QUESTION ABOUT PNEUMONIA SHOT TOMORROW??  Phone Note Call from Patient   Caller: Patient Summary of Call: PATIENT IS COMING IN TUESDAY 10/18 FOR A FLU SHOT---HER PAPER CHART AND EMR IMMUNIZATION LIST SHOWS NO PNEUMONIA SHOT----SHOULD SHE GET ONE???   IF SO, CAN SHE GET THEM TOGETHER     OR SHOULD THEY BE SPACED ONE WEEK APART?? Initial call taken by: Berneta Sages,  April 29, 2010 9:38 AM  Follow-up for Phone Call        She is not due for pneumonia until 65 yo  Follow-up by: Garnet Koyanagi DO,  April 29, 2010 11:29 AM  Additional Follow-up for Phone Call Additional follow up Details #1::        left pt detail message.............Marland KitchenFelecia Deloach CMA  April 29, 2010 12:29 PM

## 2010-08-15 NOTE — Progress Notes (Signed)
Summary: Lab Results   Phone Note Outgoing Call   Call placed by: Allyn Kenner CMA,  October 30, 2009 9:44 AM Reason for Call: Discuss lab or test results Summary of Call: Regarding lab results, LMTCB:  Take Lipitor 20 mg once daily   #30  1 by mouth at bedtime , 2 refills-----LDL goal < 70 Rest of labs ---good----recheck 3 months      lipid, hep, hgba1c, bmp 250.00 272.4---if she cannot tolerate it---try crestor 10 mg every other day #15---give samples Signed by Garnet Koyanagi DO on 10/29/2009 at 8:22 PM   Follow-up for Phone Call        Pt requested I leave on voicemail. sent in 20 mg of lipitor. Allyn Kenner CMA  October 31, 2009 9:13 AM     New/Updated Medications: LIPITOR 20 MG TABS (ATORVASTATIN CALCIUM) take 1 by mouth  at bedtime Prescriptions: LIPITOR 20 MG TABS (ATORVASTATIN CALCIUM) take 1 by mouth  at bedtime  #30 x 0   Entered by:   Allyn Kenner CMA   Authorized by:   Garnet Koyanagi DO   Signed by:   Allyn Kenner CMA on 10/31/2009   Method used:   [facility]ed to ...       [facility]  [address]  [phone] ([facility]ail[facility]       [address]       [place],   [address]       Ph: [phone] or [phone]       Fax: [phone]   RxID:   [id number]

## 2010-08-15 NOTE — Progress Notes (Signed)
Summary: RLS  Phone Note Call from Patient   Summary of Call: Pt states that the medication she was given for RLS is not helping at all. She states once she gets asleep she is okay but its falling asleep thats her isssue. She does not want a sleeping aid. Allyn Kenner CMA  January 02, 2010 11:53 AM   Follow-up for Phone Call        mirapex 0.125 1 by mouth qpm in 4 days increase to 2 by mouth at bedtime if still not better in 1 week increase to 3 ---- max 0.5 mg at bedtime ---#60 0 refills--- she will need to call us with the dose that worked  Follow-up by: Garnet Koyanagi DO,  January 02, 2010 11:56 AM  Additional Follow-up for Phone Call Additional follow up Details #1::        Left message for pt to call back. Allyn Kenner CMA  January 02, 2010 1:15 PM     Additional Follow-up for Phone Call Additional follow up Details #2::    Spoke with pt and she is aware to call us with what dosage she picks. Allyn Kenner CMA  January 02, 2010 1:34 PM   New/Updated Medications: MIRAPEX 0.125 MG TABS (PRAMIPEXOLE DIHYDROCHLORIDE) 1 by mouth qpm in 4 days increase to 2 by mouth at bedtime if still not better in 1 week increase to 3 Prescriptions: MIRAPEX 0.125 MG TABS (PRAMIPEXOLE DIHYDROCHLORIDE) 1 by mouth qpm in 4 days increase to 2 by mouth at bedtime if still not better in 1 week increase to 3  #60 x 0   Entered by:   Allyn Kenner CMA   Authorized by:   Garnet Koyanagi DO   Signed by:   Allyn Kenner CMA on 01/02/2010   Method used:   Faxed to ...       [facility]  [address]  [phone] ([facility]ail[facility]       [address]       [place], [place]  [address]       Ph: [phone] or [phone]       Fax: [phone]   RxID:   [id number]

## 2010-08-15 NOTE — Progress Notes (Signed)
Summary: REFILL  Phone Note Refill Request Message from:  Fax from Pharmacy on [address]  Refills Requested: Medication #1:  LISINOPRIL-HYDROCHLOROTHIAZIDE 10-12.5 MG  TABS 1 by mouth once daily Initial call taken by: Silva Bandy,  August 10, 2009 8:46 AM    Prescriptions: LISINOPRIL-HYDROCHLOROTHIAZIDE 10-12.5 MG  TABS (LISINOPRIL-HYDROCHLOROTHIAZIDE) 1 by mouth once daily  #90 x 0   Entered by:   Allyn Kenner CMA   Authorized by:   Garnet Koyanagi DO   Signed by:   Allyn Kenner CMA on 08/10/2009   Method used:   Faxed to ...       [facility]  [address]  [phone] (retail[facility]       [address]       [place], [place]  [address]       Ph: [phone] or [phone]       Fax: [phone]   RxID:   [id number]

## 2010-08-15 NOTE — Progress Notes (Signed)
Summary: refill  Phone Note Refill Request Message from:  Fax from Pharmacy on October 30, 2009 12:28 PM  l.orazepam 0.5mg  [address] [place]go [phone]   Method Requested: Fax to Local Pharmacy Next Appointment Scheduled: 11/26/2009 Initial call taken by: Silva Bandy,  October 30, 2009 12:29 PM  Follow-up for Phone Call        Pt should have hard copy- left message for pt asking if she had this if she needed an rx to let us know but she was given one on 10/29/09. [place]  October 30, 2009 12:41 PM

## 2010-08-15 NOTE — Progress Notes (Signed)
Summary: Wanting ATB  Phone Note Call from Patient   Caller: Patient Summary of Call: Pt called and stated that she believes that she has a sinus infection and would like Dr.Lowne to call in an ATB for her. I called the pt back and left a voicemail. Pt must have an appt first before we can call in an ATB. Allyn Kenner CMA  September 25, 2009 12:52 PM   Follow-up for Phone Call        Spoke with pt and informed her she would have to have an appt for an ATB, offered her an appt . She stated she was going to wait to see how she felt in the a.m. and call if she was no better. [place]  September 25, 2009 1:21 PM

## 2010-08-15 NOTE — Assessment & Plan Note (Signed)
Summary: sinus infection//lch   Vital Signs:  Patient profile:   65 year old female Weight:      214.13 pounds BMI:     39.31 O2 Sat:      98 % on Room air Temp:     97.1 degrees F oral Pulse rate:   64 / minute Pulse rhythm:   regular Resp:     20 per minute BP sitting:   100 / 60  (left arm) Cuff size:   large  Vitals Entered By: Kelle Darting CMA (September 26, 2009 10:35 AM)  O2 Flow:  Room air CC: room 16  Possible sinus infection?   Primary Care Provider:  Etter Sjogren  CC:  room 16  Possible sinus infection?.  History of Present Illness: Bethany Miller is a 65 yr old female who present with c/o sore throat 1 week ago with LAD.  Took some old antibiotics and got better.  This past saturday she developed multiple OTC preps, benadryl, claritin.  Notes +HA,  green/red sinus drainage.  Notes mild subjective fever.  Slept "all day yesterday, was wiped out."  Depression/anxiety- notes 9 pound weight loss since she started celexa- wants to know if she can try something else.  Has used effexor in the past but this was too expensive  Allergies (verified): No Known Drug Allergies  Physical Exam  General:  Well-developed,well-nourished,in no acute distress; alert,appropriate and cooperative throughout examination Head:  Normocephalic and atraumatic without obvious abnormalities. No apparent alopecia or balding. + maxillary sinus tenderness to palpation. Eyes:  PERRLA Ears:  Bilateral TM's with yellow fluid noted behind membranes.   Mouth:  Oral mucosa and oropharynx without lesions or exudates.  Teeth in good repair. Neck:  No deformities, masses, or tenderness noted. Lungs:  Normal respiratory effort, chest expands symmetrically. Lungs are clear to auscultation, no crackles or wheezes. Heart:  Normal rate and regular rhythm. S1 and S2 normal without gallop, murmur, click, rub or other extra sounds.   Impression & Recommendations:  Problem # 1:  SINUSITIS (P9671135.9) Assessment New Pt  advised on red flags that should prompt return per pt instructions.   Her updated medication list for this problem includes:    Amoxicillin 500 Mg Cap (Amoxicillin) .Marland Kitchen... Take 1 capsule by mouth three times a day x 10 days  Problem # 2:  DEPRESSION (ICD-311) Will try fluoxetine and see if this drug is more weight neutral for the patient.  If she continues to gain weight on this medication, will need to consider NRI.  Plan follow up in 1 month.   Pt notes that depression symptoms are currently improved.   Her updated medication list for this problem includes:    Celexa 20 Mg Tabs (Citalopram hydrobromide) .Marland Kitchen... 1 by mouth once daily    Ativan 0.5 Mg Tabs (Lorazepam) .Marland Kitchen... 1 by mouth three times a day as needed    Fluoxetine Hcl 20 Mg Caps (Fluoxetine hcl) ..... One tablet by mouth daily  Problem # 3:  ANXIETY (ICD-300.00) Assessment: Improved Pt notes well controlled with ativan.  Continue same Her updated medication list for this problem includes:    Celexa 20 Mg Tabs (Citalopram hydrobromide) .Marland Kitchen... 1 by mouth once daily    Ativan 0.5 Mg Tabs (Lorazepam) .Marland Kitchen... 1 by mouth three times a day as needed    Fluoxetine Hcl 20 Mg Caps (Fluoxetine hcl) ..... One tablet by mouth daily  Complete Medication List: 1)  Lisinopril-hydrochlorothiazide 10-12.5 Mg Tabs (Lisinopril-hydrochlorothiazide) .Marland Kitchen.. 1 by mouth once  daily 2)  Metformin Hcl 500 Mg Tabs (Metformin hcl) .... Take one tablet twice daily. 3)  Freestyle Lite Test Strp (Glucose blood) .... Use one strip twice daily 4)  Freestyle Unistick Ii Lancets Misc (Lancets) .... Use one lancet twice daily. 5)  Fenofibrate 160 Mg Tabs (Fenofibrate) .Marland Kitchen.. 1 by mouth once daily. 6)  Lipitor 20 Mg Tabs (Atorvastatin calcium) .... Take 1 by mouth  at bedtime 7)  Hydrocodone-acetaminophen 5-325 Mg Tabs (Hydrocodone-acetaminophen) .... Take 1-2 tablets at bedtime as needed. 8)  Aspirin 325 Mg Tabs (Aspirin) .... Daily. 9)  Celexa 20 Mg Tabs (Citalopram  hydrobromide) .Marland Kitchen.. 1 by mouth once daily 10)  Ativan 0.5 Mg Tabs (Lorazepam) .Marland Kitchen.. 1 by mouth three times a day as needed 11)  Amoxicillin 500 Mg Cap (Amoxicillin) .... Take 1 capsule by mouth three times a day x 10 days 12)  Fluoxetine Hcl 20 Mg Caps (Fluoxetine hcl) .... One tablet by mouth daily  Patient Instructions: 1)  Call if you develop fever over 101, increasing sinus pressure, pain with eye movement, increased facial tenderness of swelling, or if you develop visual changes. 2)  Please follow up in 1 month, sooner if problems or concerns Prescriptions: FLUOXETINE HCL 20 MG CAPS (FLUOXETINE HCL) one tablet by mouth daily  #30 x 0   Entered and Authorized by:   Nance Pear FNP   Signed by:   Nance Pear FNP on 09/26/2009   Method used:   Print then Give to Patient   RxID:   [id number] AMOXICILLIN 500 MG CAP (AMOXICILLIN) Take 1 capsule by mouth three times a day X 10 days  #30 x 0   Entered and Authorized by:   Nance Pear FNP   Signed by:   Nance Pear FNP on 09/26/2009   Method used:   Print then Give to Patient   [id number]   Current Allergies (reviewed today): No known allergies

## 2010-08-15 NOTE — Assessment & Plan Note (Signed)
Summary: FLU SHOT ONLY, NO PNEUMONIA SHOT PER DR LOWNE///SP[facility]   Nurse Visit   Allergies: No Known Drug Allergies  Orders Added: 1)  Admin 1st Vaccine [90471] 2)  Flu Vaccine 54yrs + [id number] Flu Vaccine Consent Questions     Do you have a history of severe allergic reactions to this vaccine? no    Any prior history of allergic reactions to egg and/or gelatin? no    Do you have a sensitivity to the preservative Thimersol? no    Do you have a past history of Guillan-Barre Syndrome? no    Do you currently have an acute febrile illness? no    Have you ever had a severe reaction to latex? no    Vaccine information given and explained to patient? yes    Are you currently pregnant? no    Lot Number:[id number]   Exp Date:01/11/2011   Site Given  Left Deltoid IM .lbflu

## 2010-08-15 NOTE — Assessment & Plan Note (Signed)
Summary: sinus/cbs   Vital Signs:  Patient profile:   65 year old female Weight:      228.8 pounds O2 Sat:      96 % on Room air Temp:     97.6 degrees F oral Pulse rate:   67 / minute Pulse rhythm:   regular BP sitting:   124 / 70  (right arm)  Vitals Entered By: Aron Baba CMA Deborra Medina) (March 29, 2010 4:18 PM)  O2 Flow:  Room air CC: c/o headache, sinus pressure, and congestion, URI symptoms   History of Present Illness:       This is a 65 year old woman who presents with URI symptoms.  The symptoms began 1 week ago.  Pt has been taking advil cold and sinus.  The patient complains of nasal congestion, purulent nasal discharge, productive cough, earache, and sick contacts, but denies sore throat.  The patient denies fever, low-grade fever (<100.5 degrees), fever of 100.5-103 degrees, fever of 103.1-104 degrees, fever to >104 degrees, stiff neck, dyspnea, wheezing, rash, vomiting, diarrhea, use of an antipyretic, and response to antipyretic.  The patient also reports headache.  The patient denies itchy watery eyes, itchy throat, sneezing, seasonal symptoms, response to antihistamine, muscle aches, and severe fatigue.  Risk factors for Strep sinusitis include unilateral facial pain and unilateral nasal discharge.    Current Medications (verified): 1)  Lisinopril-Hydrochlorothiazide 10-12.5 Mg  Tabs (Lisinopril-Hydrochlorothiazide) .Marland Kitchen.. 1 By Mouth Once Daily 2)  Metformin Hcl 500 Mg Tabs (Metformin Hcl) .... Take One Tablet Twice Daily. 3)  Freestyle Lite Test  Strp (Glucose Blood) .... Use One Strip Twice Daily 4)  Freestyle Unistick Ii Lancets  Misc (Lancets) .... Use One Lancet Twice Daily. 5)  Lipitor 20 Mg Tabs (Atorvastatin Calcium) .Marland Kitchen.. 1 By Mouth At Bedtime 6)  Hydrocodone-Acetaminophen 5-325 Mg Tabs (Hydrocodone-Acetaminophen) .... Take 1-2 Tablets At Bedtime As Needed. 7)  Celexa 20 Mg Tabs (Citalopram Hydrobromide) .Marland Kitchen.. 1 By Mouth Once Daily 8)  Elocon 0.1 % Crea  (Mometasone Furoate) .... Apply Once Daily 9)  Augmentin 875-125 Mg Tabs (Amoxicillin-Pot Clavulanate) .Marland Kitchen.. 1 By Mouth Two Times A Day 10)  Flonase 50 Mcg/act Susp (Fluticasone Propionate) .... 2 Sprays Each Nostril Once Daily 11)  Ambien 10 Mg Tabs (Zolpidem Tartrate) .Marland Kitchen.. 1 By Mouth At Bedtime As Needed  Allergies (verified): No Known Drug Allergies  Past History:  Past medical, surgical, family and social histories (including risk factors) reviewed for relevance to current acute and chronic problems.  Past Medical History: Reviewed history from 04/16/2009 and no changes required. Current Problems:  MURMUR (ICD-785 Current Problems:  HYPERGLYCEMIA, FASTING (ICD-790.29) CHEST PAIN (ICD-786.50) ESSENTIAL HYPERTENSION, BENIGN (ICD-401.1) EDEMA (ICD-782.3) INSOMNIA (ICD-780.52) MURMUR (ICD-785.2) Diabetes mellitus, type II Hyperlipidemia  Past Surgical History: Reviewed history from 06/18/2009 and no changes required. Total knee replacement  Family History: Reviewed history from 05/08/2008 and no changes required. Family History of Melanoma B-- cancer throat Family History of Arthritis F--cirrhosis of liver--etoh M--died when pt wass 65yo   Social History: Reviewed history from 05/08/2008 and no changes required. 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   Mouth:  pharyngeal erythema and postnasal drip.   Neck:  No deformities, masses, or tenderness noted. Lungs:  Normal respiratory  effort, chest expands symmetrically. Lungs are clear to auscultation, no crackles or wheezes. Heart:  normal rate and no murmur.   Psych:  Oriented X3 and normally  interactive.     Impression & Recommendations:  Problem # 1:  SINUSITIS (O6978498.9)  Take antibiotics for full duration. Discussed treatment options including indications for coronal CT scan of sinuses and ENT referral.   Her updated medication list for this problem includes:    Augmentin 875-125 Mg Tabs (Amoxicillin-pot clavulanate) .Marland Kitchen... 1 by mouth two times a day    Flonase 50 Mcg/act Susp (Fluticasone propionate) .Marland Kitchen... 2 sprays each nostril once daily  Complete Medication List: 1)  Lisinopril-hydrochlorothiazide 10-12.5 Mg Tabs (Lisinopril-hydrochlorothiazide) .Marland Kitchen.. 1 by mouth once daily 2)  Metformin Hcl 500 Mg Tabs (Metformin hcl) .... Take one tablet twice daily. 3)  Freestyle Lite Test Strp (Glucose blood) .... Use one strip twice daily 4)  Freestyle Unistick Ii Lancets Misc (Lancets) .... Use one lancet twice daily. 5)  Lipitor 20 Mg Tabs (Atorvastatin calcium) .Marland Kitchen.. 1 by mouth at bedtime 6)  Hydrocodone-acetaminophen 5-325 Mg Tabs (Hydrocodone-acetaminophen) .... Take 1-2 tablets at bedtime as needed. 7)  Celexa 20 Mg Tabs (Citalopram hydrobromide) .Marland Kitchen.. 1 by mouth once daily 8)  Elocon 0.1 % Crea (Mometasone furoate) .... Apply once daily 9)  Augmentin 875-125 Mg Tabs (Amoxicillin-pot clavulanate) .Marland Kitchen.. 1 by mouth two times a day 10)  Flonase 50 Mcg/act Susp (Fluticasone propionate) .... 2 sprays each nostril once daily 11)  Ambien 10 Mg Tabs (Zolpidem tartrate) .Marland Kitchen.. 1 by mouth at bedtime as needed  Patient Instructions: 1)  Acute sinusitis symptoms for less than 10 days are not helped by antibiotics. Use warm moist compresses, and over the counter decongestants( only as directed). Call if no improvement in 5-7 days, sooner if increasing pain, fever, or new symptoms.  2)  needs fasting labs 250.00 272.4  hgba1c, bmp, lipid, hep Prescriptions: FREESTYLE LITE TEST  STRP (GLUCOSE BLOOD) Use one strip twice daily  #50 Each x 5   Entered and Authorized by:   Garnet Koyanagi DO   Signed  by:   Garnet Koyanagi DO on 03/29/2010   Method used:   [facility]axed to ...       [facility]  [address]  [phone] (retail)       [address]       [place], [place]  [address]       Ph: [phone] or [phone]       Fax: [phone]   RxID:   [id number] AMBIEN 10 MG TABS (ZOLPIDEM TARTRATE) 1 by mouth at bedtime as needed  #30 x 1   Entered and Authorized by:   Garnet Koyanagi DO   Signed by:   Garnet Koyanagi DO on 03/29/2010   Method used:   Print then Give to Patient   RxID:   [id number] FLONASE 50 MCG/ACT SUSP (FLUTICASONE PROPIONATE) 2 sprays each nostril once daily  #1 x 2   Entered and Authorized by:   Garnet Koyanagi DO   Signed by:   Garnet Koyanagi DO on 03/29/2010   Method used:   Print then Give to Patient   RxID:   [id number] AUGMENTIN 875-125 MG TABS (AMOXICILLIN-POT CLAVULANATE) 1 by mouth two times a day  #20 x 0   Entered and Authorized by:   Garnet Koyanagi DO   Signed by:   Garnet Koyanagi DO on 03/29/2010   Method used:   Print then Give to Patient   RxID:  [id number]  

## 2010-10-31 ENCOUNTER — Encounter: Payer: Self-pay | Admitting: Family Medicine

## 2010-11-26 NOTE — Assessment & Plan Note (Signed)
[facility]                            CARDIOLOGY OFFICE NOTE   NAME:Bethany Miller, Bethany Miller                         MRN:          [id number]  DATE:01/25/2008                            DOB:          11/16/1945    REFERRING PHYSICIAN:  Rosalita Chessman, DO   CHIEF COMPLAINT:  Chest pressure.   Bethany Miller was known to me.  I have been taking care of her husband for  quite some time.  This very nice lady is a 65 year old woman, nonsmoker.  Her chief complaint has been mild chest pressure.  At times, she  experiences a numb and sinking feeling.  The discomfort radiates into  the back, but not so much to the arms.  It almost always seems to be  related to exertion.  She was out recently at [place], and she broke  out in a sweat.  She has not had any episodes of prolonged pain, but was  concerned enough that she thought she should get this checked out.   PAST MEDICAL HISTORY:  Remarkable for prior cholecystectomy and back  surgery.   ALLERGIES:  She has no current allergies.   MEDICATIONS:  1. Lisinopril/hydrochlorothiazide 10/12.5 daily.  2. Ambien 10 mg one-half nightly.  3. Effexor 37.5 mg daily.  4. Aspirin 81 mg daily.  5. Multivitamin daily.   FAMILY HISTORY:  Remarkable for mother who died of unknown cause.  Her  father died of cirrhosis.  She does have a brother who has had some  heart disease and another brother who has had coronary artery bypass  graft surgery at 70, and is not a smoker.   SOCIAL HISTORY:  She is married to Mr. Blacksher.  She works at [employer]  and goes up and down [employer].  She does not smoke.   REVIEW OF SYSTEMS:  Her weight has been relatively stable.  She has  gradually lost some weight.  She has not had major GI problems or  complaints.  There is no bleeding in the bowels.  The review of systems  is otherwise negative.   PHYSICAL EXAMINATION:  GENERAL:  She is an alert, oriented female, in no  acute distress.  VITAL  SIGNS:  The weight is 239 pounds.  Blood pressure 124/70.  The  pulse is 73.  Blood pressures are equal in both arms.  HEENT:  Grossly  unremarkable.  NECK:  Carotid upstrokes are brisk.  No carotid bruits are appreciated.  RESPIRATORY:  The lung fields are clear to auscultation and percussion.  Her breasts are not directly examined.  With an escort, we examined her  cardiac exam.  The PMI is not obviously displaced.  There is normal  first and second heart sounds and I do not appreciate a rub or gallop.  ABDOMEN:  Soft without obvious hepatosplenomegaly.  EXTREMITIES:  Intact with no edema and pulses are equal bilaterally.  NEUROLOGIC:  Unremarkable.   EKG reveals normal sinus rhythm and essentially within normal limits.   IMPRESSION:  1. Somewhat typical chest discomfort with cardiac risk factors, which  include hypertension, exertional symptoms, and positive family      history.  2. Mild hypertension on combination of ACE inhibitor and diuretic.  3. Normal left ventricular function with no major valvular      abnormalities and borderline left atrial enlargement by      echocardiography on January 10, 2008.  4. Prior cholecystectomy.  5. Prior back surgery.  6. Moderate obesity.   PLAN:  1. Return to clinic for followup following stress Myoview imaging to      evaluate the patient for high-risk coronary artery disease.  2. Stressed importance of ultimate weight loss.     Loretha Brasil. Lia Foyer, MD, [facility]  Electronically Signed    TDS/MedQ  DD: 01/28/2008  DT: 01/28/2008  Job #: [id number]

## 2010-12-05 ENCOUNTER — Other Ambulatory Visit: Payer: Self-pay | Admitting: Family Medicine

## 2010-12-05 NOTE — Telephone Encounter (Signed)
Pt is due for labs and office visit per labs from 06/2010

## 2011-02-08 ENCOUNTER — Other Ambulatory Visit: Payer: Self-pay | Admitting: Family Medicine

## 2011-03-18 ENCOUNTER — Encounter: Payer: Self-pay | Admitting: Family Medicine

## 2011-03-18 ENCOUNTER — Ambulatory Visit (INDEPENDENT_AMBULATORY_CARE_PROVIDER_SITE_OTHER): Payer: 59 | Admitting: Family Medicine

## 2011-03-18 ENCOUNTER — Other Ambulatory Visit (HOSPITAL_COMMUNITY)
Admission: RE | Admit: 2011-03-18 | Discharge: 2011-03-18 | Disposition: A | Discharge status: Home / Self Care | Payer: 59 | Source: Ambulatory Visit | Attending: Family Medicine | Admitting: Family Medicine

## 2011-03-18 VITALS — BP 128/72 | HR 59 | Temp 97.9°F | Ht 64.0 in | Wt 236.4 lb

## 2011-03-18 DIAGNOSIS — E039 Hypothyroidism, unspecified: Secondary | ICD-10-CM

## 2011-03-18 DIAGNOSIS — Z0000 Encounter for general adult medical examination without abnormal findings: Secondary | ICD-10-CM

## 2011-03-18 DIAGNOSIS — E119 Type 2 diabetes mellitus without complications: Secondary | ICD-10-CM

## 2011-03-18 DIAGNOSIS — Z780 Asymptomatic menopausal state: Secondary | ICD-10-CM

## 2011-03-18 DIAGNOSIS — F32A Depression, unspecified: Secondary | ICD-10-CM

## 2011-03-18 DIAGNOSIS — G4700 Insomnia, unspecified: Secondary | ICD-10-CM

## 2011-03-18 DIAGNOSIS — F3289 Other specified depressive episodes: Secondary | ICD-10-CM

## 2011-03-18 DIAGNOSIS — E785 Hyperlipidemia, unspecified: Secondary | ICD-10-CM

## 2011-03-18 DIAGNOSIS — N390 Urinary tract infection, site not specified: Secondary | ICD-10-CM

## 2011-03-18 DIAGNOSIS — I10 Essential (primary) hypertension: Secondary | ICD-10-CM

## 2011-03-18 DIAGNOSIS — F329 Major depressive disorder, single episode, unspecified: Secondary | ICD-10-CM

## 2011-03-18 DIAGNOSIS — Z01419 Encounter for gynecological examination (general) (routine) without abnormal findings: Secondary | ICD-10-CM | POA: Insufficient documentation

## 2011-03-18 DIAGNOSIS — K589 Irritable bowel syndrome without diarrhea: Secondary | ICD-10-CM

## 2011-03-18 LAB — POCT URINALYSIS DIPSTICK
Bilirubin, UA: NEGATIVE
Blood, UA: NEGATIVE
Glucose, UA: NEGATIVE
Nitrite, UA: POSITIVE
Urobilinogen, UA: 0.2

## 2011-03-18 MED ORDER — METFORMIN HCL 500 MG PO TABS: 500.0000 mg | ORAL_TABLET | Freq: Two times a day (BID) | ORAL | Status: DC

## 2011-03-18 MED ORDER — CITALOPRAM HYDROBROMIDE 20 MG PO TABS: 20.0000 mg | ORAL_TABLET | Freq: Every day | ORAL | Status: DC

## 2011-03-18 MED ORDER — HYOSCYAMINE SULFATE 0.125 MG SL SUBL: 0.1250 mg | SUBLINGUAL_TABLET | SUBLINGUAL | Status: DC | PRN

## 2011-03-18 MED ORDER — LISINOPRIL-HYDROCHLOROTHIAZIDE 10-12.5 MG PO TABS: ORAL_TABLET | ORAL | Status: DC

## 2011-03-18 MED ORDER — ZOLPIDEM TARTRATE 10 MG PO TABS: 10.0000 mg | ORAL_TABLET | Freq: Every evening | ORAL | Status: DC | PRN

## 2011-03-18 NOTE — Progress Notes (Signed)
Subjective:     Bethany Miller is a 65 y.o. female and is here for a comprehensive physical exam. The patient reports problems - +diarrhea every time she eats.  .  History   Social History  . Marital Status: Married    Spouse Name: N/A    Number of Children: N/A  . Years of Education: N/A   Occupational History  . Not on file.   Social History Main Topics  . Smoking status: Never Smoker   . Smokeless tobacco: Never Used  . Alcohol Use: Yes  . Drug Use: No  . Sexually Active: Yes -- Female partner(s)   Other Topics Concern  . Not on file   Social History Narrative  . No narrative on file   Health Maintenance  Topic Date Due  . Pap Smear  08/21/1965  . Influenza Vaccine  04/14/2011  . Mammogram  10/27/2012  . Colonoscopy  06/26/2018  . Tetanus/tdap  11/27/2019  . Zostavax  Completed    The following portions of the patient's history were reviewed and updated as appropriate: allergies, current medications, past family history, past medical history, past social history, past surgical history and problem list.  Review of Systems Review of Systems  Constitutional: Negative for activity change, appetite change and fatigue.  HENT: Negative for hearing loss, congestion, tinnitus and ear discharge.  dentist q75m Eyes: Negative for visual disturbance (see optho q1y -- vision corrected to 20/20 with glasses).  Respiratory: Negative for cough, chest tightness and shortness of breath.   Cardiovascular: Negative for chest pain, palpitations and leg swelling.  Gastrointestinal: Negative for, constipation and abdominal distention. ---+ diarrhea and abd cramping Genitourinary: Negative for urgency, frequency, decreased urine volume and difficulty urinating.  Musculoskeletal: Negative for back pain, arthralgias and gait problem.  Skin: Negative for color change, pallor and rash.  Neurological: Negative for dizziness, light-headedness, numbness and headaches.  Hematological: Negative for  adenopathy. Does not bruise/bleed easily.  Psychiatric/Behavioral: Negative for suicidal ideas, confusion, sleep disturbance, self-injury, dysphoric mood, decreased concentration and agitation.       Objective:  BP 128/72  Pulse 59  Temp(Src) 97.9 F (36.6 C) (Oral)  Ht 5\' 4"  (1.626 m)  Wt 236 lb 6.4 oz (107.23 kg)  BMI 40.58 kg/m2  SpO2 98%  General Appearance:    Alert, cooperative, no distress, appears stated age  Head:    Normocephalic, without obvious abnormality, atraumatic  Eyes:    PERRL, conjunctiva/corneas clear, EOM's intact, fundi    benign, both eyes  Ears:    Normal TM's and external ear canals, both ears  Nose:   Nares normal, septum midline, mucosa normal, no drainage    or sinus tenderness  Throat:   Lips, mucosa, and tongue normal; teeth and gums normal  Neck:   Supple, symmetrical, trachea midline, no adenopathy;    thyroid:  no enlargement/tenderness/nodules; no carotid   bruit or JVD  Back:     Symmetric, no curvature, ROM normal, no CVA tenderness  Lungs:     Clear to auscultation bilaterally, respirations unlabored  Chest Wall:    No tenderness or deformity   Heart:    Regular rate and rhythm, S1 and S2 normal, no murmur, rub   or gallop  Breast Exam:    No tenderness, masses, or nipple abnormality  Abdomen:     Soft, non-tender, bowel sounds active all four quadrants,    no masses, no organomegaly  Genitalia:    Normal female without lesion, discharge or  tenderness  Rectal:    Normal tone, normal prostate, no masses or tenderness;   guaiac negative stool  Extremities:   Extremities normal, atraumatic, no cyanosis or edema  Pulses:   2+ and symmetric all extremities  Skin:   Skin color, texture, turgor normal, no rashes or lesions  Lymph nodes:   Cervical, supraclavicular, and axillary nodes normal  Neurologic:   CNII-XII intact, normal strength, sensation and reflexes    throughout        Assessment:    Healthy female exam.  DM  II HTN Hyperlipidemia depression  Plan:    ghm utd Check fasting labs mammo done See After Visit Summary for Counseling Recommendations

## 2011-03-18 NOTE — Patient Instructions (Signed)

## 2011-03-19 ENCOUNTER — Encounter: Payer: Self-pay | Admitting: Gastroenterology

## 2011-03-19 ENCOUNTER — Telehealth: Payer: Self-pay

## 2011-03-19 LAB — CBC WITH DIFFERENTIAL/PLATELET
Basophils Relative: 1.9 % (ref 0.0–3.0)
Eosinophils Relative: 2 % (ref 0.0–5.0)
HCT: 39.3 % (ref 36.0–46.0)
Hemoglobin: 13.1 g/dL (ref 12.0–15.0)
Lymphs Abs: 2.4 10*3/uL (ref 0.7–4.0)
MCV: 92.3 fl (ref 78.0–100.0)
Monocytes Absolute: 1.2 10*3/uL — ABNORMAL HIGH (ref 0.1–1.0)
Monocytes Relative: 18.1 % — ABNORMAL HIGH (ref 3.0–12.0)
Neutro Abs: 2.8 10*3/uL (ref 1.4–7.7)
Platelets: 249 10*3/uL (ref 150.0–400.0)
WBC: 6.6 10*3/uL (ref 4.5–10.5)

## 2011-03-19 LAB — MICROALBUMIN / CREATININE URINE RATIO
Creatinine,U: 197.4 mg/dL
Microalb Creat Ratio: 0.4 mg/g (ref 0.0–30.0)

## 2011-03-19 LAB — BASIC METABOLIC PANEL
BUN: 28 mg/dL — ABNORMAL HIGH (ref 6–23)
Chloride: 107 mEq/L (ref 96–112)
GFR: 51.07 mL/min — ABNORMAL LOW (ref >60.00)
Potassium: 4 mEq/L (ref 3.5–5.1)
Sodium: 141 mEq/L (ref 135–145)

## 2011-03-19 LAB — HEPATIC FUNCTION PANEL
ALT: 21 U/L (ref 0–35)
AST: 19 U/L (ref 0–37)
Albumin: 4.2 g/dL (ref 3.5–5.2)
Alkaline Phosphatase: 75 U/L (ref 39–117)
Total Protein: 7.4 g/dL (ref 6.0–8.3)

## 2011-03-19 LAB — LIPID PANEL
Cholesterol: 278 mg/dL — ABNORMAL HIGH (ref 0–200)
VLDL: 60.8 mg/dL — ABNORMAL HIGH (ref 0.0–40.0)

## 2011-03-19 MED ORDER — HYOSCYAMINE SULFATE 0.125 MG PO TABS: 0.1250 mg | ORAL_TABLET | ORAL | Status: DC | PRN

## 2011-03-19 MED ORDER — CIPROFLOXACIN HCL 500 MG PO TABS: 500.0000 mg | ORAL_TABLET | Freq: Two times a day (BID) | ORAL | Status: AC

## 2011-03-19 NOTE — Telephone Encounter (Signed)
Message copied by Ewing Schlein on Wed Mar 19, 2011 12:29 PM ------      Message from: Rosalita Chessman      Created: Tue Mar 18, 2011  4:07 PM       + UTI ----cipro 500 1 po bid #10

## 2011-03-19 NOTE — Telephone Encounter (Signed)
Discussed with patient and Rx called in to the pharmacy   KP

## 2011-03-19 NOTE — Telephone Encounter (Signed)
Call from patient and she stated she was given a sublingual tablet of the Levsin and would like a regular pill instead because she does not like anything under her tongue. Please advise     KP

## 2011-03-20 LAB — URINE CULTURE: Colony Count: >=100000

## 2011-03-24 ENCOUNTER — Telehealth: Payer: Self-pay

## 2011-03-24 ENCOUNTER — Encounter: Payer: Self-pay | Admitting: Family Medicine

## 2011-03-24 MED ORDER — ATORVASTATIN CALCIUM 20 MG PO TABS: 20.0000 mg | ORAL_TABLET | Freq: Every day | ORAL | Status: DC

## 2011-03-24 NOTE — Telephone Encounter (Signed)
Discussed Labs with patient and she stated she had not been taking her Metformin correctly, she said she had only been taking it once a day. She wants to know if she can start taking it correctly and rechcek her labs in 3 months instead of doing the Metformin XR . Please advise     KP

## 2011-03-24 NOTE — Telephone Encounter (Signed)
Message copied by Ewing Schlein on Mon Mar 24, 2011  8:49 AM ------      Message from: Rosalita Chessman      Created: Thu Mar 20, 2011  9:04 PM       Pap normal      Cholesterol--- LDL goal < 70,  HDL >40,  TG < 150.  Diet and exercise will increase HDL and decrease LDL and TG.  Fish,  Fish Oil, Flaxseed oil will also help increase the HDL and decrease Triglycerides.   Recheck labs in 3 months.----  Start lipitor 20 mg  #30  1 po qhs , 2 refills            Dm not controlled-----goal a1c < 7---increase metformin xr 500 mg  2 po qd  #60  2 refills      Recheck 3 months----250.00  272.4  Lipid, hep, bmp, hgba1c

## 2011-03-24 NOTE — Telephone Encounter (Signed)
Patient aware     KP 

## 2011-03-24 NOTE — Telephone Encounter (Signed)
Yes that is fine

## 2011-04-16 ENCOUNTER — Ambulatory Visit: Payer: 59 | Admitting: Gastroenterology

## 2011-05-20 ENCOUNTER — Ambulatory Visit (INDEPENDENT_AMBULATORY_CARE_PROVIDER_SITE_OTHER): Payer: 59

## 2011-05-20 DIAGNOSIS — Z23 Encounter for immunization: Secondary | ICD-10-CM

## 2011-05-28 ENCOUNTER — Other Ambulatory Visit: Payer: Self-pay | Admitting: Family Medicine

## 2011-05-28 MED ORDER — ATORVASTATIN CALCIUM 20 MG PO TABS: 20.0000 mg | ORAL_TABLET | Freq: Every day | ORAL | Status: DC

## 2011-05-28 NOTE — Telephone Encounter (Signed)
Labs UTD. Rx faxed     KP

## 2011-06-30 ENCOUNTER — Other Ambulatory Visit: Payer: Self-pay | Admitting: Family Medicine

## 2011-06-30 MED ORDER — ATORVASTATIN CALCIUM 20 MG PO TABS: 20.0000 mg | ORAL_TABLET | Freq: Every day | ORAL | Status: DC

## 2011-06-30 NOTE — Telephone Encounter (Signed)
Faxed.   KP 

## 2011-07-09 ENCOUNTER — Encounter: Payer: Self-pay | Admitting: Internal Medicine

## 2011-07-09 ENCOUNTER — Ambulatory Visit (INDEPENDENT_AMBULATORY_CARE_PROVIDER_SITE_OTHER): Payer: 59 | Admitting: Internal Medicine

## 2011-07-09 DIAGNOSIS — J019 Acute sinusitis, unspecified: Secondary | ICD-10-CM

## 2011-07-09 DIAGNOSIS — J209 Acute bronchitis, unspecified: Secondary | ICD-10-CM

## 2011-07-09 DIAGNOSIS — G4700 Insomnia, unspecified: Secondary | ICD-10-CM

## 2011-07-09 MED ORDER — ZOLPIDEM TARTRATE 10 MG PO TABS: 10.0000 mg | ORAL_TABLET | Freq: Every evening | ORAL | Status: DC | PRN

## 2011-07-09 MED ORDER — CEFUROXIME AXETIL 500 MG PO TABS: 500.0000 mg | ORAL_TABLET | Freq: Two times a day (BID) | ORAL | Status: AC

## 2011-07-09 MED ORDER — CEFUROXIME AXETIL 500 MG PO TABS: 500.0000 mg | ORAL_TABLET | Freq: Two times a day (BID) | ORAL | Status: DC

## 2011-07-09 MED ORDER — HYDROCODONE-HOMATROPINE 5-1.5 MG/5ML PO SYRP: 5.0000 mL | ORAL_SOLUTION | Freq: Four times a day (QID) | ORAL | Status: AC | PRN

## 2011-07-09 NOTE — Patient Instructions (Signed)
Plain Mucinex for thick secretions ;force NON dairy fluids . Use a Neti pot daily as needed for sinus congestion

## 2011-07-09 NOTE — Progress Notes (Signed)
  Subjective:    Patient ID: Bethany Miller, female    DOB: [birth year], 65 y.o.   MRN: [id number]  HPI Respiratory tract infection Onset/symptoms:12/24 as NP cough Exposures (illness/environmental/extrinsic):daughter & granddaughter Progression of symptoms:to severe cough, ST Treatments/response:Mucinex DM, Rx cough syrup , Amox from Dentist 12/18-12/25 w/o benefit Present symptoms: Fever/chills/sweats:night sweats Frontal headache:no  Facial pain:no Nasal purulence:yes Dental pain:not since extraction  Lymphadenopathy:no Wheezing/shortness of breath: some wheezing Cough/sputum/hemoptysis:green / yellow Pleuritic pain:no Past medical history: asthma:no Smoking history:never           Review of Systems FBS 100-110     Objective:   Physical Exam General appearance :well nourished; no acute distress or increased work of breathing is present.  No  lymphadenopathy about the head, neck, or axilla noted.   Eyes: No conjunctival inflammation or lid edema is present.  Ears:  External ear exam shows no significant lesions or deformities.  Otoscopic examination reveals clear canals, tympanic membranes are intact bilaterally without bulging, retraction, inflammation or discharge.  Nose:  External nasal examination shows no deformity or inflammation. Nasal mucosa are pink and moist without lesions or exudates. No septal dislocation .No obstruction to airflow.   Oral exam: Dental hygiene is good; lips and gums are healthy appearing.There is no oropharyngeal erythema or exudate noted.     Heart:  Normal rate and regular rhythm. S1 and S2 normal without gallop, murmur, click, rub . S 4 with slurring Lungs:Chest clear to auscultation; no wheezes, rhonchi,rales ,or rubs present.No increased work of breathing.    Extremities:  No cyanosis, edema, or clubbing  noted    Skin: Warm & dry w/o jaundice or tenting.           Assessment & Plan:   #1 rhinosinusitis and bronchitis despite  7 days of amoxicillin 500 mg 3 times a day. This suggests resistant organism  #2 diabetes, well controlled based on home measurements  Plan: See orders and recommendations

## 2011-07-09 NOTE — Progress Notes (Signed)
Addended by: Logan Bores on: 07/09/2011 05:13 PM   Modules accepted: Orders

## 2011-07-29 ENCOUNTER — Encounter: Payer: Self-pay | Admitting: Family Medicine

## 2011-07-29 ENCOUNTER — Ambulatory Visit (INDEPENDENT_AMBULATORY_CARE_PROVIDER_SITE_OTHER): Payer: 59 | Admitting: Family Medicine

## 2011-07-29 ENCOUNTER — Ambulatory Visit (HOSPITAL_BASED_OUTPATIENT_CLINIC_OR_DEPARTMENT_OTHER)
Admission: RE | Admit: 2011-07-29 | Discharge: 2011-07-29 | Disposition: A | Discharge status: Home / Self Care | Payer: 59 | Source: Ambulatory Visit | Attending: Family Medicine | Admitting: Family Medicine

## 2011-07-29 VITALS — BP 110/80 | HR 84 | Temp 99.4°F | Ht 63.0 in | Wt 236.2 lb

## 2011-07-29 DIAGNOSIS — R05 Cough: Secondary | ICD-10-CM | POA: Insufficient documentation

## 2011-07-29 DIAGNOSIS — J40 Bronchitis, not specified as acute or chronic: Secondary | ICD-10-CM

## 2011-07-29 DIAGNOSIS — T148XXA Other injury of unspecified body region, initial encounter: Secondary | ICD-10-CM

## 2011-07-29 DIAGNOSIS — R059 Cough, unspecified: Secondary | ICD-10-CM | POA: Insufficient documentation

## 2011-07-29 DIAGNOSIS — W5581XA Bitten by other mammals, initial encounter: Secondary | ICD-10-CM

## 2011-07-29 MED ORDER — GUAIFENESIN-CODEINE 100-10 MG/5ML PO SYRP: 5.0000 mL | ORAL_SOLUTION | Freq: Three times a day (TID) | ORAL | Status: AC | PRN

## 2011-07-29 MED ORDER — LEVOFLOXACIN 500 MG PO TABS: 500.0000 mg | ORAL_TABLET | Freq: Every day | ORAL | Status: AC

## 2011-07-29 MED ORDER — PREDNISONE 10 MG PO TABS: ORAL_TABLET | ORAL | Status: DC

## 2011-07-29 MED ORDER — METHYLPREDNISOLONE ACETATE 80 MG/ML IJ SUSP
80.0000 mg | Freq: Once | INTRAMUSCULAR | Status: AC
  Administered 2011-07-29: 80 mg via INTRAMUSCULAR

## 2011-07-29 NOTE — Progress Notes (Deleted)
  Subjective:    Patient ID: Bethany Miller, female    DOB: Dec 17, 1945, 66 y.o.   MRN: [id number]  HPI    Review of Systems     Objective:   Physical Exam  Skin:             Assessment & Plan:

## 2011-07-29 NOTE — Progress Notes (Signed)
Addended by: Shara Blazing A on: 07/29/2011 05:02 PM   Modules accepted: Orders

## 2011-07-29 NOTE — Patient Instructions (Signed)

## 2011-07-29 NOTE — Progress Notes (Signed)
  Subjective:     Bethany Miller is a 66 y.o. female here for evaluation of a cough. Onset of symptoms was 3 weeks ago. Symptoms have been gradually worsening since that time. The cough is productive and is aggravated by exercise. Associated symptoms include: chills, shortness of breath and sputum production. Patient does not have a history of asthma. Patient does not have a history of environmental allergens. Patient has not traveled recently. Patient does not have a history of smoking. Patient has not had a previous chest x-ray. Patient has not had a PPD done.  The following portions of the patient's history were reviewed and updated as appropriate: allergies, current medications, past family history, past medical history, past social history, past surgical history and problem list.  Review of Systems Pertinent items are noted in HPI.    Objective:    Oxygen saturation 95% on room air BP 110/80  Pulse 84  Temp(Src) 99.4 F (37.4 C) (Oral)  Ht 5\' 3"  (1.6 m)  Wt 236 lb 3.2 oz (107.14 kg)  BMI 41.84 kg/m2  SpO2 95% General appearance: alert, cooperative, appears stated age and no distress Head: Normocephalic, without obvious abnormality, atraumatic Eyes: conjunctivae/corneas clear. PERRL, EOM's intact. Fundi benign. Ears: normal TM's and external ear canals both ears Nose: Nares normal. Septum midline. Mucosa normal. No drainage or sinus tenderness. Throat: lips, mucosa, and tongue normal; teeth and gums normal Neck: mild anterior cervical adenopathy, no carotid bruit, no JVD, supple, symmetrical, trachea midline and thyroid not enlarged, symmetric, no tenderness/mass/nodules Lungs: wheezes bilaterally Heart: regular rate and rhythm, S1, S2 normal, no murmur, click, rub or gallop Extremities: extremities normal, atraumatic, no cyanosis or edema   R calf--- stitches from dog bite, healing well  Assessment:    Acute Bronchitis   dog bite-- 8 stitches removed from one area, other bite  mark not healing as well  Plan:    Antibiotics per medication orders. Antitussives per medication orders. Avoid exposure to tobacco smoke and fumes. B-agonist inhaler. Call if shortness of breath worsens, blood in sputum, change in character of cough, development of fever or chills, inability to maintain nutrition and hydration. Avoid exposure to tobacco smoke and fumes. Chest x-ray.

## 2011-07-29 NOTE — Progress Notes (Signed)
  Subjective:    Patient ID: Bethany Miller, female    DOB: 11/19/1945, 66 y.o.   MRN: [id number]  HPI    Review of Systems     Objective:   Physical Exam  Skin:             Assessment & Plan:

## 2011-08-01 ENCOUNTER — Ambulatory Visit: Payer: 59 | Admitting: Family Medicine

## 2011-08-04 ENCOUNTER — Ambulatory Visit (INDEPENDENT_AMBULATORY_CARE_PROVIDER_SITE_OTHER): Payer: 59 | Admitting: Family Medicine

## 2011-08-04 DIAGNOSIS — Z4802 Encounter for removal of sutures: Secondary | ICD-10-CM

## 2011-08-04 NOTE — Progress Notes (Signed)
  Subjective:    Patient ID: Bethany Miller, female    DOB: October 31, 1945, 66 y.o.   MRN: [id number]  HPI Pt here for suture removal   Review of Systems as above   Objective:   Physical Exam  3 stiches removed with no complications,  Steri strips in place and new dressing put on     Assessment & Plan:  Dog bite--- healing well

## 2011-08-19 ENCOUNTER — Ambulatory Visit (INDEPENDENT_AMBULATORY_CARE_PROVIDER_SITE_OTHER): Payer: 59 | Admitting: Internal Medicine

## 2011-08-19 ENCOUNTER — Telehealth: Payer: Self-pay | Admitting: Family Medicine

## 2011-08-19 VITALS — BP 138/70 | HR 70 | Temp 97.6°F | Wt 235.0 lb

## 2011-08-19 DIAGNOSIS — L0390 Cellulitis, unspecified: Secondary | ICD-10-CM

## 2011-08-19 DIAGNOSIS — L0291 Cutaneous abscess, unspecified: Secondary | ICD-10-CM

## 2011-08-19 MED ORDER — DOXYCYCLINE HYCLATE 100 MG PO TABS: 100.0000 mg | ORAL_TABLET | Freq: Two times a day (BID) | ORAL | Status: AC

## 2011-08-19 NOTE — Patient Instructions (Signed)
Take the antibiotic , doxycycline x 10 days   Also take Align OTC 1 tab a day x 4 weeks

## 2011-08-19 NOTE — Telephone Encounter (Signed)
[facility] Triage Call Report Triage Record Num: [id number] Operator: Lebron Conners Patient Name: Bethany Miller Call Date & Time: 08/19/2011 3:00:53PM Patient Phone: [phone] PCP: Rosalita Chessman Patient Gender: Female PCP Fax : [phone] Patient DOB: 05/08/1946 Practice Name: [facility] Reason for Call: Caller: [name]; PCP: Rosalita Chessman.; CB#: [phone]; ; ; Calling today 08/19/11 regarding had dog bite on 07/20/11, was seen in ED, stitches. Dr. Etter Sjogren took stitches out and looks infected. Bite was on right calf. Afebrile. Area is hard, red and painful. Emergent symptoms r/o by Bites - Animal or Human guidelines with exception of new signs and symptoms of local infection. Spoke with Renee at office and appt scheduled for today at 3:45 PM with Dr. Larose Kells. Care advice given. Protocol(s) Used: Bites - Animal or Human Recommended Outcome per Protocol: See Provider within 4 hours Reason for Outcome: New signs and symptoms of local infection Care Advice: Apply warm, moist soaks or compresses to the affected area for 20-30 minutes 3 to 4 times per day. Avoid burning skin by using water no hotter than bath water and by not lying on the compresses. ~ Infection Control Precautions: - Carefully wash hands briskly using warm water and soap for at least 15 seconds before and after touching affected area. - Cover area with a clean, dry bandage until healed. Dispose of old bandages in a plastic bag and throw out with regular trash. - Avoid close contact with others until area is healed. Do not re-use or share personal items like towels, brushes, razors, etc. ~ Analgesic/Antipyretic Advice - Acetaminophen: Consider acetaminophen as directed on label or by pharmacist/provider for pain or fever PRECAUTIONS: - Use if there is no history of liver disease, alcoholism, or intake of three or more alcohol drinks per day - Only if approved by provider during pregnancy or  when breastfeeding - During pregnancy, acetaminophen should not be taken more than 3 consecutive days without telling provider - Do not exceed recommended dose or frequency ~ Analgesic/Antipyretic Advice - NSAIDs: Consider aspirin, ibuprofen, naproxen or ketoprofen for pain or fever as directed on label or by pharmacist/provider. PRECAUTIONS: - If over 34 years of age, should not take longer than 1 week without consulting provider. EXCEPTIONS: - Should not be used if taking blood thinners or have bleeding problems. - Do not use if have history of sensitivity/allergy to any of these medications; or history of cardiovascular, ulcer, kidney, liver disease or diabetes unless approved by provider. - Do not exceed recommended dose or frequency.

## 2011-08-19 NOTE — Progress Notes (Signed)
  Subjective:    Patient ID: Bethany Miller, female    DOB: Jan 02, 1946, 66 y.o.   MRN: [id number]  HPI Acute visit Status post a dog bite last month, status augmentin, the area looks slightly better but is still hurting, in a scale from 0-10 is a 5. The pain is described as a burning. Of note is that the a dog was put to sleep and  did not have rabies according to the patient.   Past Medical History  Diagnosis Date  . Murmur   . Diabetes mellitus   . Hypertension   . Hyperlipidemia      Review of Systems  denies fever or chills No actual discharge.     Objective:   Physical Exam  Musculoskeletal:       Legs:         Assessment & Plan:  Dog bite, mild residual cellulitis?. Recommend to keep the area clean and dry, use warm compresses twice a day, will treat with antibiotics again. She has been taking antibiotics on and off for several weeks, I told her that I'm  concerned about s/e from abx like diarrhea, I recommend her to take the line. See instructions. The patient will let us know if she's not improving.

## 2011-08-19 NOTE — Telephone Encounter (Signed)
Patient was seen today with Dr.Paz at 3:45    KP

## 2011-08-20 ENCOUNTER — Encounter: Payer: Self-pay | Admitting: Internal Medicine

## 2011-09-17 ENCOUNTER — Telehealth: Payer: Self-pay | Admitting: Family Medicine

## 2011-09-17 DIAGNOSIS — G4700 Insomnia, unspecified: Secondary | ICD-10-CM

## 2011-09-17 NOTE — Telephone Encounter (Signed)
Refill: Zolidepam tartrate 10 mg tablet. Take 1 tablet at bedtime as needed.

## 2011-09-17 NOTE — Telephone Encounter (Signed)
Refill x1 

## 2011-09-17 NOTE — Telephone Encounter (Signed)
Last filled 07/09/11 #30,NR.  Last seen 08/19/11

## 2011-09-18 MED ORDER — ZOLPIDEM TARTRATE 10 MG PO TABS: 10.0000 mg | ORAL_TABLET | Freq: Every evening | ORAL | Status: DC | PRN

## 2011-09-18 NOTE — Telephone Encounter (Signed)
Faxed.   KP 

## 2011-10-25 ENCOUNTER — Other Ambulatory Visit: Payer: Self-pay | Admitting: Family Medicine

## 2011-10-27 NOTE — Telephone Encounter (Signed)
250.00 272.4 Lipid, hep, bmp, hgba1c

## 2012-03-05 ENCOUNTER — Ambulatory Visit (INDEPENDENT_AMBULATORY_CARE_PROVIDER_SITE_OTHER): Payer: 59 | Admitting: Family Medicine

## 2012-03-05 ENCOUNTER — Encounter: Payer: Self-pay | Admitting: Family Medicine

## 2012-03-05 ENCOUNTER — Telehealth: Payer: Self-pay | Admitting: Family Medicine

## 2012-03-05 VITALS — BP 124/80 | HR 68 | Temp 98.1°F | Ht 61.75 in | Wt 235.0 lb

## 2012-03-05 DIAGNOSIS — N390 Urinary tract infection, site not specified: Secondary | ICD-10-CM

## 2012-03-05 DIAGNOSIS — R350 Frequency of micturition: Secondary | ICD-10-CM

## 2012-03-05 DIAGNOSIS — IMO0001 Reserved for inherently not codable concepts without codable children: Secondary | ICD-10-CM

## 2012-03-05 LAB — POCT URINALYSIS DIPSTICK

## 2012-03-05 MED ORDER — CEPHALEXIN 500 MG PO CAPS: 500.0000 mg | ORAL_CAPSULE | Freq: Two times a day (BID) | ORAL | Status: AC

## 2012-03-05 NOTE — Patient Instructions (Addendum)
Start the Keflex twice daily for presumed UTI Drink plenty of fluids Call with any questions or concerns Hang in there!!!

## 2012-03-05 NOTE — Progress Notes (Signed)
  Subjective:    Patient ID: Bethany Miller, female    DOB: Dec 07, 1945, 66 y.o.   MRN: [id number]  HPI ? UTI- + frequency, urgency.  sxs started Wednesday.  Increased fluid intake.  Started AZO yesterday.  Suprapubic pressure.  No fever or back pain.  Hx of similar.   Review of Systems For ROS see HPI     Objective:   Physical Exam  Vitals reviewed. Constitutional: She appears well-developed and well-nourished. No distress.  Abdominal: Soft. She exhibits no distension. There is no tenderness (no suprapubic or CVA tenderness).          Assessment & Plan:

## 2012-03-05 NOTE — Telephone Encounter (Signed)
Caller: [name]/Patient; Patient Name: Bethany Miller; PCP: Rosalita Chessman.; Best Callback Phone Number: [phone].  Patient states she developed urinary frequency and urgency. Onset 03/03/12. Denies hematuria. Denies flank pain. Afebrile. Patient states she has been drinking cranberry juice and taking over the counter Azo with no improvement. Triage per Urinary Symptoms Protocol. No emergent symptoms identified. Care advice given per guidelines. Patient advised increased fluids, Ibuprofen 600mg . every 6 hours as needed for pain. Call back parameters reviewed. Patient verbalizes understanding. Appointment scheduled for 03/05/12 1300 with Dr. Annye Asa related to positive triage assessment for Urinary Tract Symptoms that nave not been previously evaluated.

## 2012-03-09 ENCOUNTER — Other Ambulatory Visit (INDEPENDENT_AMBULATORY_CARE_PROVIDER_SITE_OTHER): Payer: 59

## 2012-03-09 DIAGNOSIS — N390 Urinary tract infection, site not specified: Secondary | ICD-10-CM

## 2012-03-09 LAB — POCT URINALYSIS DIPSTICK
Bilirubin, UA: NEGATIVE
Ketones, UA: NEGATIVE
Leukocytes, UA: NEGATIVE
pH, UA: 6

## 2012-03-09 NOTE — Assessment & Plan Note (Signed)
New to provider.  Pt's UA invalid due to AZO but sxs suspicious for UTI.  Start abx.  Reviewed supportive care and red flags that should prompt return.  Pt expressed understanding and is in agreement w/ plan.

## 2012-03-19 ENCOUNTER — Other Ambulatory Visit: Payer: Self-pay | Admitting: Family Medicine

## 2012-03-24 ENCOUNTER — Encounter: Payer: Self-pay | Admitting: Family Medicine

## 2012-03-24 ENCOUNTER — Ambulatory Visit (INDEPENDENT_AMBULATORY_CARE_PROVIDER_SITE_OTHER): Payer: 59 | Admitting: Family Medicine

## 2012-03-24 ENCOUNTER — Other Ambulatory Visit (HOSPITAL_COMMUNITY)
Admission: RE | Admit: 2012-03-24 | Discharge: 2012-03-24 | Disposition: A | Discharge status: Home / Self Care | Payer: 59 | Source: Ambulatory Visit | Attending: Family Medicine | Admitting: Family Medicine

## 2012-03-24 VITALS — BP 122/80 | HR 64 | Temp 97.7°F | Ht 62.0 in | Wt 232.4 lb

## 2012-03-24 DIAGNOSIS — T7589XA Other specified effects of external causes, initial encounter: Secondary | ICD-10-CM

## 2012-03-24 DIAGNOSIS — Z01419 Encounter for gynecological examination (general) (routine) without abnormal findings: Secondary | ICD-10-CM | POA: Insufficient documentation

## 2012-03-24 DIAGNOSIS — F3289 Other specified depressive episodes: Secondary | ICD-10-CM

## 2012-03-24 DIAGNOSIS — R5381 Other malaise: Secondary | ICD-10-CM

## 2012-03-24 DIAGNOSIS — E785 Hyperlipidemia, unspecified: Secondary | ICD-10-CM

## 2012-03-24 DIAGNOSIS — Z23 Encounter for immunization: Secondary | ICD-10-CM

## 2012-03-24 DIAGNOSIS — I10 Essential (primary) hypertension: Secondary | ICD-10-CM

## 2012-03-24 DIAGNOSIS — K589 Irritable bowel syndrome without diarrhea: Secondary | ICD-10-CM

## 2012-03-24 DIAGNOSIS — IMO0001 Reserved for inherently not codable concepts without codable children: Secondary | ICD-10-CM

## 2012-03-24 DIAGNOSIS — E119 Type 2 diabetes mellitus without complications: Secondary | ICD-10-CM

## 2012-03-24 DIAGNOSIS — Z780 Asymptomatic menopausal state: Secondary | ICD-10-CM

## 2012-03-24 DIAGNOSIS — R5383 Other fatigue: Secondary | ICD-10-CM

## 2012-03-24 DIAGNOSIS — Z124 Encounter for screening for malignant neoplasm of cervix: Secondary | ICD-10-CM

## 2012-03-24 DIAGNOSIS — F329 Major depressive disorder, single episode, unspecified: Secondary | ICD-10-CM

## 2012-03-24 DIAGNOSIS — F32A Depression, unspecified: Secondary | ICD-10-CM

## 2012-03-24 DIAGNOSIS — Z0000 Encounter for general adult medical examination without abnormal findings: Secondary | ICD-10-CM

## 2012-03-24 LAB — CBC WITH DIFFERENTIAL/PLATELET
Basophils Relative: 0.7 % (ref 0.0–3.0)
Eosinophils Absolute: 0.1 10*3/uL (ref 0.0–0.7)
Hemoglobin: 12.5 g/dL (ref 12.0–15.0)
MCHC: 33.1 g/dL (ref 30.0–36.0)
MCV: 90.4 fl (ref 78.0–100.0)
Monocytes Absolute: 0.4 10*3/uL (ref 0.1–1.0)
Neutro Abs: 3.1 10*3/uL (ref 1.4–7.7)
RBC: 4.18 Mil/uL (ref 3.87–5.11)

## 2012-03-24 LAB — POCT URINALYSIS DIPSTICK
Ketones, UA: NEGATIVE
Protein, UA: NEGATIVE
Spec Grav, UA: >=1.03

## 2012-03-24 LAB — VITAMIN B12: Vitamin B-12: 807 pg/mL (ref 211–911)

## 2012-03-24 LAB — HEMOGLOBIN A1C: Hgb A1c MFr Bld: 7.6 % — ABNORMAL HIGH (ref 4.6–6.5)

## 2012-03-24 MED ORDER — GLUCOSE BLOOD VI STRP: ORAL_STRIP | Status: DC

## 2012-03-24 MED ORDER — HYOSCYAMINE SULFATE 0.125 MG SL SUBL: 0.1250 mg | SUBLINGUAL_TABLET | SUBLINGUAL | Status: DC | PRN

## 2012-03-24 MED ORDER — BUPROPION HCL ER (XL) 150 MG PO TB24: ORAL_TABLET | ORAL | Status: DC

## 2012-03-24 NOTE — Assessment & Plan Note (Signed)
Check labs 

## 2012-03-24 NOTE — Addendum Note (Signed)
Addended by: Modena Morrow D on: 03/24/2012 12:23 PM   Modules accepted: Orders

## 2012-03-24 NOTE — Patient Instructions (Addendum)
Preventive Care for Adults, Female A healthy lifestyle and preventive care can promote health and wellness. Preventive health guidelines for women include the following key practices.  A routine yearly physical is a good way to check with your caregiver about your health and preventive screening. It is a chance to share any concerns and updates on your health, and to receive a thorough exam.   Visit your dentist for a routine exam and preventive care every 6 months. Brush your teeth twice a day and floss once a day. Good oral hygiene prevents tooth decay and gum disease.   The frequency of eye exams is based on your age, health, family medical history, use of contact lenses, and other factors. Follow your caregiver's recommendations for frequency of eye exams.   Eat a healthy diet. Foods like vegetables, fruits, whole grains, low-fat dairy products, and lean protein foods contain the nutrients you need without too many calories. Decrease your intake of foods high in solid fats, added sugars, and salt. Eat the right amount of calories for you.Get information about a proper diet from your caregiver, if necessary.   Regular physical exercise is one of the most important things you can do for your health. Most adults should get at least 150 minutes of moderate-intensity exercise (any activity that increases your heart rate and causes you to sweat) each week. In addition, most adults need muscle-strengthening exercises on 2 or more days a week.   Maintain a healthy weight. The body mass index (BMI) is a screening tool to identify possible weight problems. It provides an estimate of body fat based on height and weight. Your caregiver can help determine your BMI, and can help you achieve or maintain a healthy weight.For adults 20 years and older:   A BMI below 18.5 is considered underweight.   A BMI of 18.5 to 24.9 is normal.   A BMI of 25 to 29.9 is considered overweight.   A BMI of 30 and above is  considered obese.   Maintain normal blood lipids and cholesterol levels by exercising and minimizing your intake of saturated fat. Eat a balanced diet with plenty of fruit and vegetables. Blood tests for lipids and cholesterol should begin at age 20 and be repeated every 5 years. If your lipid or cholesterol levels are high, you are over 50, or you are at high risk for heart disease, you may need your cholesterol levels checked more frequently.Ongoing high lipid and cholesterol levels should be treated with medicines if diet and exercise are not effective.   If you smoke, find out from your caregiver how to quit. If you do not use tobacco, do not start.   If you are pregnant, do not drink alcohol. If you are breastfeeding, be very cautious about drinking alcohol. If you are not pregnant and choose to drink alcohol, do not exceed 1 drink per day. One drink is considered to be 12 ounces (355 mL) of beer, 5 ounces (148 mL) of wine, or 1.5 ounces (44 mL) of liquor.   Avoid use of street drugs. Do not share needles with anyone. Ask for help if you need support or instructions about stopping the use of drugs.   High blood pressure causes heart disease and increases the risk of stroke. Your blood pressure should be checked at least every 1 to 2 years. Ongoing high blood pressure should be treated with medicines if weight loss and exercise are not effective.   If you are 55 to 64   years old, ask your caregiver if you should take aspirin to prevent strokes.   Diabetes screening involves taking a blood sample to check your fasting blood sugar level. This should be done once every 3 years, after age 45, if you are within normal weight and without risk factors for diabetes. Testing should be considered at a younger age or be carried out more frequently if you are overweight and have at least 1 risk factor for diabetes.   Breast cancer screening is essential preventive care for women. You should practice "breast  self-awareness." This means understanding the normal appearance and feel of your breasts and may include breast self-examination. Any changes detected, no matter how small, should be reported to a caregiver. Women in their 20s and 30s should have a clinical breast exam (CBE) by a caregiver as part of a regular health exam every 1 to 3 years. After age 40, women should have a CBE every year. Starting at age 40, women should consider having a mammography (breast X-ray test) every year. Women who have a family history of breast cancer should talk to their caregiver about genetic screening. Women at a high risk of breast cancer should talk to their caregivers about having magnetic resonance imaging (MRI) and a mammography every year.   The Pap test is a screening test for cervical cancer. A Pap test can show cell changes on the cervix that might become cervical cancer if left untreated. A Pap test is a procedure in which cells are obtained and examined from the lower end of the uterus (cervix).   Women should have a Pap test starting at age 21.   Between ages 21 and 29, Pap tests should be repeated every 2 years.   Beginning at age 30, you should have a Pap test every 3 years as long as the past 3 Pap tests have been normal.   Some women have medical problems that increase the chance of getting cervical cancer. Talk to your caregiver about these problems. It is especially important to talk to your caregiver if a new problem develops soon after your last Pap test. In these cases, your caregiver may recommend more frequent screening and Pap tests.   The above recommendations are the same for women who have or have not gotten the vaccine for human papillomavirus (HPV).   If you had a hysterectomy for a problem that was not cancer or a condition that could lead to cancer, then you no longer need Pap tests. Even if you no longer need a Pap test, a regular exam is a good idea to make sure no other problems are  starting.   If you are between ages 65 and 70, and you have had normal Pap tests going back 10 years, you no longer need Pap tests. Even if you no longer need a Pap test, a regular exam is a good idea to make sure no other problems are starting.   If you have had past treatment for cervical cancer or a condition that could lead to cancer, you need Pap tests and screening for cancer for at least 20 years after your treatment.   If Pap tests have been discontinued, risk factors (such as a new sexual partner) need to be reassessed to determine if screening should be resumed.   The HPV test is an additional test that may be used for cervical cancer screening. The HPV test looks for the virus that can cause the cell changes on the cervix.   The cells collected during the Pap test can be tested for HPV. The HPV test could be used to screen women aged 30 years and older, and should be used in women of any age who have unclear Pap test results. After the age of 30, women should have HPV testing at the same frequency as a Pap test.   Colorectal cancer can be detected and often prevented. Most routine colorectal cancer screening begins at the age of 50 and continues through age 75. However, your caregiver may recommend screening at an earlier age if you have risk factors for colon cancer. On a yearly basis, your caregiver may provide home test kits to check for hidden blood in the stool. Use of a small camera at the end of a tube, to directly examine the colon (sigmoidoscopy or colonoscopy), can detect the earliest forms of colorectal cancer. Talk to your caregiver about this at age 50, when routine screening begins. Direct examination of the colon should be repeated every 5 to 10 years through age 75, unless early forms of pre-cancerous polyps or small growths are found.   Hepatitis C blood testing is recommended for all people born from 1945 through 1965 and any individual with known risks for hepatitis C.    Practice safe sex. Use condoms and avoid high-risk sexual practices to reduce the spread of sexually transmitted infections (STIs). STIs include gonorrhea, chlamydia, syphilis, trichomonas, herpes, HPV, and human immunodeficiency virus (HIV). Herpes, HIV, and HPV are viral illnesses that have no cure. They can result in disability, cancer, and death. Sexually active women aged 25 and younger should be checked for chlamydia. Older women with new or multiple partners should also be tested for chlamydia. Testing for other STIs is recommended if you are sexually active and at increased risk.   Osteoporosis is a disease in which the bones lose minerals and strength with aging. This can result in serious bone fractures. The risk of osteoporosis can be identified using a bone density scan. Women ages 65 and over and women at risk for fractures or osteoporosis should discuss screening with their caregivers. Ask your caregiver whether you should take a calcium supplement or vitamin D to reduce the rate of osteoporosis.   Menopause can be associated with physical symptoms and risks. Hormone replacement therapy is available to decrease symptoms and risks. You should talk to your caregiver about whether hormone replacement therapy is right for you.   Use sunscreen with sun protection factor (SPF) of 30 or more. Apply sunscreen liberally and repeatedly throughout the day. You should seek shade when your shadow is shorter than you. Protect yourself by wearing long sleeves, pants, a wide-brimmed hat, and sunglasses year round, whenever you are outdoors.   Once a month, do a whole body skin exam, using a mirror to look at the skin on your back. Notify your caregiver of new moles, moles that have irregular borders, moles that are larger than a pencil eraser, or moles that have changed in shape or color.   Stay current with required immunizations.   Influenza. You need a dose every fall (or winter). The composition of  the flu vaccine changes each year, so being vaccinated once is not enough.   Pneumococcal polysaccharide. You need 1 to 2 doses if you smoke cigarettes or if you have certain chronic medical conditions. You need 1 dose at age 65 (or older) if you have never been vaccinated.   Tetanus, diphtheria, pertussis (Tdap, Td). Get 1 dose of   Tdap vaccine if you are younger than age 65, are over 65 and have contact with an infant, are a healthcare worker, are pregnant, or simply want to be protected from whooping cough. After that, you need a Td booster dose every 10 years. Consult your caregiver if you have not had at least 3 tetanus and diphtheria-containing shots sometime in your life or have a deep or dirty wound.   HPV. You need this vaccine if you are a woman age 26 or younger. The vaccine is given in 3 doses over 6 months.   Measles, mumps, rubella (MMR). You need at least 1 dose of MMR if you were born in 1957 or later. You may also need a second dose.   Meningococcal. If you are age 19 to 21 and a first-year college student living in a residence hall, or have one of several medical conditions, you need to get vaccinated against meningococcal disease. You may also need additional booster doses.   Zoster (shingles). If you are age 60 or older, you should get this vaccine.   Varicella (chickenpox). If you have never had chickenpox or you were vaccinated but received only 1 dose, talk to your caregiver to find out if you need this vaccine.   Hepatitis A. You need this vaccine if you have a specific risk factor for hepatitis A virus infection or you simply wish to be protected from this disease. The vaccine is usually given as 2 doses, 6 to 18 months apart.   Hepatitis B. You need this vaccine if you have a specific risk factor for hepatitis B virus infection or you simply wish to be protected from this disease. The vaccine is given in 3 doses, usually over 6 months.  Preventive Services /  Frequency Ages 19 to 39  Blood pressure check.** / Every 1 to 2 years.   Lipid and cholesterol check.** / Every 5 years beginning at age 20.   Clinical breast exam.** / Every 3 years for women in their 20s and 30s.   Pap test.** / Every 2 years from ages 21 through 29. Every 3 years starting at age 30 through age 65 or 70 with a history of 3 consecutive normal Pap tests.   HPV screening.** / Every 3 years from ages 30 through ages 65 to 70 with a history of 3 consecutive normal Pap tests.   Hepatitis C blood test.** / For any individual with known risks for hepatitis C.   Skin self-exam. / Monthly.   Influenza immunization.** / Every year.   Pneumococcal polysaccharide immunization.** / 1 to 2 doses if you smoke cigarettes or if you have certain chronic medical conditions.   Tetanus, diphtheria, pertussis (Tdap, Td) immunization. / A one-time dose of Tdap vaccine. After that, you need a Td booster dose every 10 years.   HPV immunization. / 3 doses over 6 months, if you are 26 and younger.   Measles, mumps, rubella (MMR) immunization. / You need at least 1 dose of MMR if you were born in 1957 or later. You may also need a second dose.   Meningococcal immunization. / 1 dose if you are age 19 to 21 and a first-year college student living in a residence hall, or have one of several medical conditions, you need to get vaccinated against meningococcal disease. You may also need additional booster doses.   Varicella immunization.** / Consult your caregiver.   Hepatitis A immunization.** / Consult your caregiver. 2 doses, 6 to 18 months   apart.   Hepatitis B immunization.** / Consult your caregiver. 3 doses usually over 6 months.  Ages 40 to 64  Blood pressure check.** / Every 1 to 2 years.   Lipid and cholesterol check.** / Every 5 years beginning at age 20.   Clinical breast exam.** / Every year after age 40.   Mammogram.** / Every year beginning at age 40 and continuing for as  long as you are in good health. Consult with your caregiver.   Pap test.** / Every 3 years starting at age 30 through age 65 or 70 with a history of 3 consecutive normal Pap tests.   HPV screening.** / Every 3 years from ages 30 through ages 65 to 70 with a history of 3 consecutive normal Pap tests.   Fecal occult blood test (FOBT) of stool. / Every year beginning at age 50 and continuing until age 75. You may not need to do this test if you get a colonoscopy every 10 years.   Flexible sigmoidoscopy or colonoscopy.** / Every 5 years for a flexible sigmoidoscopy or every 10 years for a colonoscopy beginning at age 50 and continuing until age 75.   Hepatitis C blood test.** / For all people born from 1945 through 1965 and any individual with known risks for hepatitis C.   Skin self-exam. / Monthly.   Influenza immunization.** / Every year.   Pneumococcal polysaccharide immunization.** / 1 to 2 doses if you smoke cigarettes or if you have certain chronic medical conditions.   Tetanus, diphtheria, pertussis (Tdap, Td) immunization.** / A one-time dose of Tdap vaccine. After that, you need a Td booster dose every 10 years.   Measles, mumps, rubella (MMR) immunization. / You need at least 1 dose of MMR if you were born in 1957 or later. You may also need a second dose.   Varicella immunization.** / Consult your caregiver.   Meningococcal immunization.** / Consult your caregiver.   Hepatitis A immunization.** / Consult your caregiver. 2 doses, 6 to 18 months apart.   Hepatitis B immunization.** / Consult your caregiver. 3 doses, usually over 6 months.  Ages 65 and over  Blood pressure check.** / Every 1 to 2 years.   Lipid and cholesterol check.** / Every 5 years beginning at age 20.   Clinical breast exam.** / Every year after age 40.   Mammogram.** / Every year beginning at age 40 and continuing for as long as you are in good health. Consult with your caregiver.   Pap test.** /  Every 3 years starting at age 30 through age 65 or 70 with a 3 consecutive normal Pap tests. Testing can be stopped between 65 and 70 with 3 consecutive normal Pap tests and no abnormal Pap or HPV tests in the past 10 years.   HPV screening.** / Every 3 years from ages 30 through ages 65 or 70 with a history of 3 consecutive normal Pap tests. Testing can be stopped between 65 and 70 with 3 consecutive normal Pap tests and no abnormal Pap or HPV tests in the past 10 years.   Fecal occult blood test (FOBT) of stool. / Every year beginning at age 50 and continuing until age 75. You may not need to do this test if you get a colonoscopy every 10 years.   Flexible sigmoidoscopy or colonoscopy.** / Every 5 years for a flexible sigmoidoscopy or every 10 years for a colonoscopy beginning at age 50 and continuing until age 75.   Hepatitis   C blood test.** / For all people born from 1945 through 1965 and any individual with known risks for hepatitis C.   Osteoporosis screening.** / A one-time screening for women ages 65 and over and women at risk for fractures or osteoporosis.   Skin self-exam. / Monthly.   Influenza immunization.** / Every year.   Pneumococcal polysaccharide immunization.** / 1 dose at age 65 (or older) if you have never been vaccinated.   Tetanus, diphtheria, pertussis (Tdap, Td) immunization. / A one-time dose of Tdap vaccine if you are over 65 and have contact with an infant, are a healthcare worker, or simply want to be protected from whooping cough. After that, you need a Td booster dose every 10 years.   Varicella immunization.** / Consult your caregiver.   Meningococcal immunization.** / Consult your caregiver.   Hepatitis A immunization.** / Consult your caregiver. 2 doses, 6 to 18 months apart.   Hepatitis B immunization.** / Check with your caregiver. 3 doses, usually over 6 months.  ** Family history and personal history of risk and conditions may change your caregiver's  recommendations. Document Released: 08/26/2001 Document Revised: 06/19/2011 Document Reviewed: 11/25/2010 [employer] Patient Information 2012 [employer]. 

## 2012-03-24 NOTE — Addendum Note (Signed)
Addended by: Ewing Schlein on: 03/24/2012 11:17 AM   Modules accepted: Orders

## 2012-03-24 NOTE — Progress Notes (Signed)
Subjective:     Bethany Miller is a 66 y.o. female and is here for a comprehensive physical exam. The patient reports problem with bowels---she sometimes comes close to losing control and having accidents in the car.     It always occurs after she eats. She is also struggling with incontinence of urine.        History   Social History  . Marital Status: Married    Spouse Name: N/A    Number of Children: N/A  . Years of Education: N/A   Occupational History  . Not on file.   Social History Main Topics  . Smoking status: Never Smoker   . Smokeless tobacco: Never Used  . Alcohol Use: Yes  . Drug Use: No  . Sexually Active: Yes -- Female partner(s)   Other Topics Concern  . Not on file   Social History Narrative  . No narrative on file   Health Maintenance  Topic Date Due  . Influenza Vaccine  04/13/2012  . Mammogram  10/27/2012  . Pap Smear  03/25/2015  . Colonoscopy  06/26/2018  . Tetanus/tdap  11/27/2019  . Zostavax  Completed    The following portions of the patient's history were reviewed and updated as appropriate: allergies, current medications, past family history, past medical history, past social history, past surgical history and problem list.  Review of Systems Review of Systems  Constitutional: Negative for activity change, appetite change and fatigue.  HENT: Negative for hearing loss, congestion, tinnitus and ear discharge.  dentist q85m Eyes: Negative for visual disturbance (see optho q1y -- vision corrected to 20/20 with glasses).  Respiratory: Negative for cough, chest tightness and shortness of breath.   Cardiovascular: Negative for chest pain, palpitations and leg swelling.  Gastrointestinal: Negative for abdominal pain, diarrhea, constipation and abdominal distention.  Genitourinary: Negative for urgency, frequency, decreased urine volume and difficulty urinating.  Musculoskeletal: Negative for back pain, arthralgias and gait problem.  Skin: Negative for  color change, pallor and rash.  Neurological: Negative for dizziness, light-headedness, numbness and headaches.  Hematological: Negative for adenopathy. Does not bruise/bleed easily.  Psychiatric/Behavioral: Negative for suicidal ideas, confusion, sleep disturbance, self-injury, dysphoric mood, decreased concentration and agitation.       Objective:    BP 122/80  Pulse 64  Temp 97.7 F (36.5 C) (Oral)  Ht 5\' 2"  (1.575 m)  Wt 232 lb 6.4 oz (105.416 kg)  BMI 42.51 kg/m2  SpO2 94% General appearance: alert, cooperative, appears stated age and no distress Head: Normocephalic, without obvious abnormality, atraumatic Eyes: conjunctivae/corneas clear. PERRL, EOM's intact. Fundi benign. Ears: normal TM's and external ear canals both ears Nose: Nares normal. Septum midline. Mucosa normal. No drainage or sinus tenderness. Throat: lips, mucosa, and tongue normal; teeth and gums normal Neck: no adenopathy, no carotid bruit, no JVD, supple, symmetrical, trachea midline and thyroid not enlarged, symmetric, no tenderness/mass/nodules Back: symmetric, no curvature. ROM normal. No CVA tenderness. Lungs: clear to auscultation bilaterally Breasts: normal appearance, no masses or tenderness Heart: regular rate and rhythm, S1, S2 normal, no murmur, click, rub or gallop Abdomen: soft, non-tender; bowel sounds normal; no masses,  no organomegaly Pelvic: cervix normal in appearance, external genitalia normal, no adnexal masses or tenderness, no cervical motion tenderness, rectovaginal septum normal, uterus normal size, shape, and consistency and vagina normal without discharge Extremities: extremities normal, atraumatic, no cyanosis or edema Pulses: 2+ and symmetric Skin: Skin color, texture, turgor normal. No rashes or lesions Lymph nodes: Cervical, supraclavicular, and  axillary nodes normal. Neurologic: Alert and oriented X 3, normal strength and tone. Normal symmetric reflexes. Normal coordination and  gait psych--- no depression, anxiety    Assessment:    Healthy female exam.      Plan:    ghm utd Check labs See After Visit Summary for Counseling Recommendations

## 2012-03-25 LAB — BASIC METABOLIC PANEL
CO2: 22 mEq/L (ref 19–32)
Chloride: 106 mEq/L (ref 96–112)
Sodium: 138 mEq/L (ref 135–145)

## 2012-03-25 LAB — LIPID PANEL
HDL: 35.8 mg/dL — ABNORMAL LOW (ref >39.00)
Triglycerides: 263 mg/dL — ABNORMAL HIGH (ref 0.0–149.0)

## 2012-03-25 LAB — HEPATIC FUNCTION PANEL
Albumin: 4 g/dL (ref 3.5–5.2)
Total Protein: 7.2 g/dL (ref 6.0–8.3)

## 2012-03-25 LAB — MICROALBUMIN / CREATININE URINE RATIO
Creatinine,U: 173.6 mg/dL
Microalb Creat Ratio: 0.3 mg/g (ref 0.0–30.0)

## 2012-03-28 LAB — VITAMIN D 1,25 DIHYDROXY: Vitamin D 1, 25 (OH)2 Total: 40 pg/mL (ref 18–72)

## 2012-03-30 ENCOUNTER — Other Ambulatory Visit: Payer: Self-pay | Admitting: Family Medicine

## 2012-03-30 DIAGNOSIS — E785 Hyperlipidemia, unspecified: Secondary | ICD-10-CM

## 2012-03-30 DIAGNOSIS — E119 Type 2 diabetes mellitus without complications: Secondary | ICD-10-CM

## 2012-03-30 MED ORDER — ATORVASTATIN CALCIUM 10 MG PO TABS: 10.0000 mg | ORAL_TABLET | Freq: Every day | ORAL | Status: DC

## 2012-03-30 MED ORDER — METFORMIN HCL 1000 MG PO TABS: 1000.0000 mg | ORAL_TABLET | Freq: Two times a day (BID) | ORAL | Status: DC

## 2012-04-06 ENCOUNTER — Other Ambulatory Visit: Payer: Self-pay | Admitting: Family Medicine

## 2012-04-06 NOTE — Telephone Encounter (Signed)
Rx has been changed and medication had been faxed   03/24/12

## 2012-07-12 ENCOUNTER — Telehealth: Payer: Self-pay | Admitting: Family Medicine

## 2012-07-12 ENCOUNTER — Telehealth: Payer: Self-pay | Admitting: *Deleted

## 2012-07-12 NOTE — Telephone Encounter (Signed)
Patient came into the office upset because the scheduled was full and made Katharine Look aware that she was going to switch providers.       KP

## 2012-07-12 NOTE — Telephone Encounter (Signed)
Patient came into office after speaking with triage at [facility] and being told that we have no available appointments today but she can call in the morning to request same day acute appt. Advised patient that all physicians are booked today but we could possibly see her tomorrow, pt stated she" would not wait until tomorrow to be seen". I suggested she go to the UC to be treated. Patient became angry and stated "what good is having a PCP if they are not there when you need them?" Patient went on to state that "you can tell her that we are done with her and my husband and I will be finding a new doctor elsewhere. We are done with her and this practice." I again reiterated to the patient that all our patients are important to [place] but we just did not have any available appointments today.

## 2012-07-12 NOTE — Telephone Encounter (Signed)
RN attempted to contact patient.  Left a voice mail.

## 2012-07-12 NOTE — Telephone Encounter (Signed)
Patient says she is upset, she said she came in because the call a nurse did not call her back. She stated she had to go to an Urgent Care and was treated the patient was still very upset and crying, I apologized and made her aware Dr.Lowne was made aware when I sent the [facility] message which was at 4:30pm , She said she did not think Dr.Lowne knew and she did not want to speak with administration. At this time she had already been seen and treated     KP

## 2012-07-12 NOTE — Telephone Encounter (Signed)
I/m sorry she is so upset- but we saw a lot of people who were very sick today.   I was told she had nose pain and she was offered an appointment tomorrow am.

## 2012-07-15 NOTE — Telephone Encounter (Signed)
Patient called stating she wants to speak with Maudie Mercury regarding the problem with her nose.

## 2012-07-15 NOTE — Telephone Encounter (Signed)
msg left to call the office     KP 

## 2012-07-16 ENCOUNTER — Other Ambulatory Visit (INDEPENDENT_AMBULATORY_CARE_PROVIDER_SITE_OTHER): Payer: 59

## 2012-07-16 DIAGNOSIS — J340 Abscess, furuncle and carbuncle of nose: Secondary | ICD-10-CM

## 2012-07-16 DIAGNOSIS — L0390 Cellulitis, unspecified: Secondary | ICD-10-CM

## 2012-07-16 DIAGNOSIS — L0291 Cutaneous abscess, unspecified: Secondary | ICD-10-CM

## 2012-07-16 NOTE — Telephone Encounter (Signed)
Spoke with patient and she agreed to come by the office at 2 pm to have the swab done to check for MRSA.      KP

## 2012-07-16 NOTE — Telephone Encounter (Signed)
Patient wanted to make sure the medication that she was taking is correct (clindamycin 300 mg tid for 10 days)  and she is concerned that it is MRSA, she said she is draining terrible pus that is yellow and green with blood. 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      KP

## 2012-07-16 NOTE — Telephone Encounter (Signed)
Clindamycin should cover mrsa--- we can do a nasal swab and send for culture

## 2012-07-20 LAB — WOUND CULTURE
Gram Stain: NONE SEEN
Gram Stain: NONE SEEN
Gram Stain: NONE SEEN

## 2012-09-13 ENCOUNTER — Ambulatory Visit (INDEPENDENT_AMBULATORY_CARE_PROVIDER_SITE_OTHER): Payer: Medicare Other | Admitting: Family Medicine

## 2012-09-13 ENCOUNTER — Encounter: Payer: Self-pay | Admitting: Family Medicine

## 2012-09-13 VITALS — BP 130/74 | HR 68 | Temp 98.1°F | Wt 234.4 lb

## 2012-09-13 DIAGNOSIS — J209 Acute bronchitis, unspecified: Secondary | ICD-10-CM | POA: Diagnosis not present

## 2012-09-13 DIAGNOSIS — E119 Type 2 diabetes mellitus without complications: Secondary | ICD-10-CM

## 2012-09-13 DIAGNOSIS — J321 Chronic frontal sinusitis: Secondary | ICD-10-CM

## 2012-09-13 MED ORDER — PREDNISONE 10 MG PO TABS: ORAL_TABLET | ORAL | Status: DC

## 2012-09-13 MED ORDER — GLUCOSE BLOOD VI STRP: ORAL_STRIP | Status: DC

## 2012-09-13 MED ORDER — IPRATROPIUM-ALBUTEROL 0.5-2.5 (3) MG/3ML IN SOLN
3.0000 mL | Freq: Once | RESPIRATORY_TRACT | Status: AC
  Administered 2012-09-13: 3 mL via RESPIRATORY_TRACT

## 2012-09-13 MED ORDER — METHYLPREDNISOLONE ACETATE 80 MG/ML IJ SUSP
80.0000 mg | Freq: Once | INTRAMUSCULAR | Status: AC
  Administered 2012-09-13: 80 mg via INTRAMUSCULAR

## 2012-09-13 MED ORDER — AMOXICILLIN-POT CLAVULANATE 875-125 MG PO TABS: 1.0000 | ORAL_TABLET | Freq: Two times a day (BID) | ORAL | Status: DC

## 2012-09-13 NOTE — Patient Instructions (Signed)

## 2012-09-13 NOTE — Progress Notes (Signed)
  Subjective:     Bethany Miller is a 67 y.o. female here for evaluation of a cough. Onset of symptoms was 5 days ago. Symptoms have been gradually worsening since that time. The cough is productive and is aggravated by exercise and reclining position. Associated symptoms include: chills, postnasal drip, shortness of breath, sputum production and wheezing. Patient does not have a history of asthma. Patient does not have a history of environmental allergens. Patient has not traveled recently. Patient does not have a history of smoking. Patient has not had a previous chest x-ray. Patient has not had a PPD done.  The following portions of the patient's history were reviewed and updated as appropriate: allergies, current medications, past family history, past medical history, past social history, past surgical history and problem list.  Review of Systems Pertinent items are noted in HPI.    Objective:    Oxygen saturation 95% on room air BP 130/74  Pulse 68  Temp(Src) 98.1 F (36.7 C) (Tympanic)  Wt 234 lb 6.4 oz (106.323 kg)  BMI 42.86 kg/m2  SpO2 95% General appearance: alert, cooperative, appears stated age and no distress Ears: abnormal TM right ear - dull Nose: green discharge, moderate congestion, turbinates swollen, sinus tenderness bilateral Throat: abnormal findings: mild oropharyngeal erythema and PND Neck: mild anterior cervical adenopathy, supple, symmetrical, trachea midline and thyroid not enlarged, symmetric, no tenderness/mass/nodules Lungs: clear to auscultation bilaterally Heart: S1, S2 normal    Assessment:    Acute Bronchitis and Sinusitis    Plan:    Antibiotics per medication orders. Avoid exposure to tobacco smoke and fumes. Call if shortness of breath worsens, blood in sputum, change in character of cough, development of fever or chills, inability to maintain nutrition and hydration. Avoid exposure to tobacco smoke and fumes. depo medrol and pred taper  Pt has  reg insulin to use at home in case BS elevated--she also has sliding scale

## 2012-10-22 ENCOUNTER — Other Ambulatory Visit: Payer: Self-pay | Admitting: Family Medicine

## 2012-10-22 DIAGNOSIS — F411 Generalized anxiety disorder: Secondary | ICD-10-CM

## 2012-10-22 MED ORDER — ALPRAZOLAM 0.5 MG PO TABS: 0.5000 mg | ORAL_TABLET | Freq: Every evening | ORAL | Status: DC | PRN

## 2012-11-02 ENCOUNTER — Telehealth: Payer: Self-pay

## 2012-11-02 NOTE — Telephone Encounter (Signed)
Klonopin 0.5 mg 1 po bid #60

## 2012-11-02 NOTE — Telephone Encounter (Signed)
Msg from the patient stating the Xanax was too strong and was making her feel bad, she has even tried to take half. She wanted to get an Rx for Klonopin 0.5 sent to [facility] [place]. Please advise     KP

## 2012-11-03 MED ORDER — CLONAZEPAM 0.5 MG PO TABS: 0.5000 mg | ORAL_TABLET | Freq: Two times a day (BID) | ORAL | Status: DC | PRN

## 2012-11-03 NOTE — Telephone Encounter (Signed)
Rx faxed to pharmacy  

## 2012-11-24 ENCOUNTER — Telehealth: Payer: Self-pay | Admitting: Family Medicine

## 2012-11-24 ENCOUNTER — Other Ambulatory Visit: Payer: Self-pay | Admitting: Family Medicine

## 2012-11-24 NOTE — Telephone Encounter (Signed)
Request came via e-scripts and have been filled     KP

## 2012-11-24 NOTE — Telephone Encounter (Signed)
Patient is requesting that we refill her meds for 1 month. She has had to cancel 2 appointments due to provider being out of the office. She states she will call to reschedule her appointment when she reviews her schedule.

## 2012-11-25 ENCOUNTER — Ambulatory Visit: Payer: Medicare Other | Admitting: Family Medicine

## 2012-11-26 ENCOUNTER — Ambulatory Visit: Payer: Medicare Other | Admitting: Family Medicine

## 2013-01-06 ENCOUNTER — Encounter: Payer: Self-pay | Admitting: Family Medicine

## 2013-01-20 ENCOUNTER — Other Ambulatory Visit: Payer: Self-pay

## 2013-01-26 ENCOUNTER — Encounter: Payer: Self-pay | Admitting: Family Medicine

## 2013-01-26 ENCOUNTER — Ambulatory Visit (INDEPENDENT_AMBULATORY_CARE_PROVIDER_SITE_OTHER): Payer: Medicare Other | Admitting: Family Medicine

## 2013-01-26 VITALS — BP 158/82 | HR 74 | Temp 97.8°F | Wt 228.8 lb

## 2013-01-26 DIAGNOSIS — E785 Hyperlipidemia, unspecified: Secondary | ICD-10-CM

## 2013-01-26 DIAGNOSIS — F411 Generalized anxiety disorder: Secondary | ICD-10-CM

## 2013-01-26 DIAGNOSIS — E1159 Type 2 diabetes mellitus with other circulatory complications: Secondary | ICD-10-CM

## 2013-01-26 DIAGNOSIS — F341 Dysthymic disorder: Secondary | ICD-10-CM

## 2013-01-26 DIAGNOSIS — F418 Other specified anxiety disorders: Secondary | ICD-10-CM

## 2013-01-26 DIAGNOSIS — E119 Type 2 diabetes mellitus without complications: Secondary | ICD-10-CM

## 2013-01-26 LAB — BASIC METABOLIC PANEL
CO2: 26 mEq/L (ref 19–32)
Calcium: 9.4 mg/dL (ref 8.4–10.5)
GFR: 51.29 mL/min — ABNORMAL LOW (ref >60.00)
Sodium: 138 mEq/L (ref 135–145)

## 2013-01-26 LAB — CBC WITH DIFFERENTIAL/PLATELET
Basophils Relative: 0.4 % (ref 0.0–3.0)
Eosinophils Relative: 2.6 % (ref 0.0–5.0)
Hemoglobin: 12.7 g/dL (ref 12.0–15.0)
Lymphocytes Relative: 26.2 % (ref 12.0–46.0)
Monocytes Relative: 5.9 % (ref 3.0–12.0)
Neutro Abs: 4.1 10*3/uL (ref 1.4–7.7)
Neutrophils Relative %: 64.9 % (ref 43.0–77.0)
RBC: 4.05 Mil/uL (ref 3.87–5.11)
WBC: 6.3 10*3/uL (ref 4.5–10.5)

## 2013-01-26 LAB — POCT URINALYSIS DIPSTICK
Glucose, UA: NEGATIVE
Nitrite, UA: NEGATIVE
Spec Grav, UA: >=1.03
Urobilinogen, UA: 0.2

## 2013-01-26 LAB — HEPATIC FUNCTION PANEL
Albumin: 4 g/dL (ref 3.5–5.2)
Alkaline Phosphatase: 78 U/L (ref 39–117)
Total Protein: 7 g/dL (ref 6.0–8.3)

## 2013-01-26 LAB — MICROALBUMIN / CREATININE URINE RATIO: Creatinine,U: 249.7 mg/dL

## 2013-01-26 LAB — LDL CHOLESTEROL, DIRECT: Direct LDL: 149.6 mg/dL

## 2013-01-26 LAB — LIPID PANEL
Cholesterol: 253 mg/dL — ABNORMAL HIGH (ref 0–200)
HDL: 37.5 mg/dL — ABNORMAL LOW (ref >39.00)

## 2013-01-26 MED ORDER — CITALOPRAM HYDROBROMIDE 40 MG PO TABS: 40.0000 mg | ORAL_TABLET | Freq: Every day | ORAL | Status: DC

## 2013-01-26 MED ORDER — LORAZEPAM 0.5 MG PO TABS: 0.5000 mg | ORAL_TABLET | Freq: Three times a day (TID) | ORAL | Status: DC | PRN

## 2013-01-26 NOTE — Assessment & Plan Note (Signed)
con't meds  Check labs 

## 2013-01-26 NOTE — Assessment & Plan Note (Signed)
Inc celexa to 40 mg daily D/c xanax Change to ativan 0.5 mg 1 po tid prn F/u prn

## 2013-01-26 NOTE — Progress Notes (Signed)
  Subjective:    Patient ID: Bethany Miller, female    DOB: 1946-04-12, 68 y.o.   MRN: [id number]  HPI Pt here c/o increase stress and depression. Her husbands health is failing and she finds herself crying  Lot and not sleeping.  She worries about him all the time.    Review of Systems As above    Objective:   Physical Exam BP 158/82  Pulse 74  Temp(Src) 97.8 F (36.6 C) (Oral)  Wt 228 lb 12.8 oz (103.783 kg)  BMI 41.84 kg/m2  SpO2 96% General appearance: alert, cooperative, appears stated age and no distress Neurologic: Mental status: Alert, oriented, thought content appropriate, alertness: alert, orientation: time, date, person, place, affect: mood-congruent Psych-- crying , not suicidal or homicidal      Assessment & Plan:

## 2013-01-26 NOTE — Assessment & Plan Note (Signed)
Check labs con't meds 

## 2013-01-26 NOTE — Patient Instructions (Addendum)

## 2013-01-27 ENCOUNTER — Telehealth: Payer: Self-pay

## 2013-01-27 NOTE — Telephone Encounter (Signed)
Message copied by Ewing Schlein on Thu Jan 27, 2013  8:03 AM ------      Message from: Rosalita Chessman      Created: Wed Jan 26, 2013  6:09 PM       Cholesterol--- LDL goal < 70,  HDL >40,  TG < 150.  Diet and exercise will increase HDL and decrease LDL and TG.  Fish,  Fish Oil, Flaxseed oil will also help increase the HDL and decrease Triglycerides.   Recheck labs in 3 months----inc lipitor to 20 mg 1 po qhs #30  2 refills            DM not controlled--. Marland Kitchen Change metformin to janument xr 100/1000 1 po qpm #30  2 refills            272.4  250.02  Lipid, hep, bmp, hgba1c.             ------

## 2013-02-01 NOTE — Telephone Encounter (Signed)
Patient is calling to inquire about her lab results. She also has questions about her Metformin medication being changed to [place].Wants to know why this change was made. Please advise.

## 2013-02-02 MED ORDER — ATORVASTATIN CALCIUM 10 MG PO TABS: 10.0000 mg | ORAL_TABLET | Freq: Every day | ORAL | Status: DC

## 2013-02-02 MED ORDER — SITAGLIP PHOS-METFORMIN HCL ER 100-1000 MG PO TB24: 1.0000 | ORAL_TABLET | Freq: Every evening | ORAL | Status: DC

## 2013-02-02 NOTE — Telephone Encounter (Signed)
Patient has been made aware and voiced understanding, The medication has been sent to the pharmacy. She did not want to start 20 mg of Lipitor because she is not taking Lipitor at this time, she will go ahead and start the 10 mg again.    KP

## 2013-02-24 ENCOUNTER — Telehealth: Payer: Self-pay | Admitting: Family Medicine

## 2013-02-24 NOTE — Telephone Encounter (Signed)
Please advise is change is appropriate.      KP

## 2013-02-24 NOTE — Telephone Encounter (Signed)
Patient states the janumet is too expensive and she would like to switch back to metformin extended release. [facility] [address] [facility] [place]

## 2013-02-24 NOTE — Telephone Encounter (Signed)
She needs more than just metformin--- if she brings her formulary we can see what else we can give but janumet is metformin with januvia (2 meds in one)_----- metformin was not good enough alone

## 2013-02-28 ENCOUNTER — Other Ambulatory Visit: Payer: Self-pay | Admitting: Family Medicine

## 2013-02-28 DIAGNOSIS — E1159 Type 2 diabetes mellitus with other circulatory complications: Secondary | ICD-10-CM

## 2013-02-28 MED ORDER — GLIMEPIRIDE 2 MG PO TABS: 2.0000 mg | ORAL_TABLET | Freq: Every day | ORAL | Status: DC

## 2013-02-28 NOTE — Telephone Encounter (Signed)
MSG left to call the office      KP 

## 2013-02-28 NOTE — Telephone Encounter (Signed)
amaryl sent to pharmacy for now but really not the best thing to treat diabetes but no other generic options.   I also put a referral in for endo.

## 2013-02-28 NOTE — Telephone Encounter (Signed)
Spoke with patient and she said she was not taking the Metformin as directed due to the diarrhea. She can not afford the other medication because it is 249 dollars and needs something else. She wanted to know if we could send in something that is cheaper. I made the patient aware to have a formulary faxed to [place] with coverage options, and she agreed to do so.  Please advise if you have any other suggestions.     KP

## 2013-02-28 NOTE — Telephone Encounter (Signed)
Perfect!  I'll wait for formulary.

## 2013-02-28 NOTE — Telephone Encounter (Signed)
Formulary left on the ledge        KP

## 2013-03-16 ENCOUNTER — Telehealth: Payer: Self-pay | Admitting: *Deleted

## 2013-03-16 DIAGNOSIS — F411 Generalized anxiety disorder: Secondary | ICD-10-CM

## 2013-03-16 MED ORDER — CITALOPRAM HYDROBROMIDE 40 MG PO TABS: 40.0000 mg | ORAL_TABLET | Freq: Every day | ORAL | Status: DC

## 2013-03-16 NOTE — Telephone Encounter (Signed)
Refill request for citalopram 40 mg Last ov 01/26/13  Last date filled 01/26/13 #30 3 R No UDS  No Contract   Ag cma

## 2013-03-16 NOTE — Telephone Encounter (Signed)
Refill for 6 months----no uds needed for celexa

## 2013-03-22 ENCOUNTER — Other Ambulatory Visit: Payer: Self-pay | Admitting: *Deleted

## 2013-03-22 DIAGNOSIS — F411 Generalized anxiety disorder: Secondary | ICD-10-CM

## 2013-03-22 MED ORDER — CITALOPRAM HYDROBROMIDE 40 MG PO TABS: 40.0000 mg | ORAL_TABLET | Freq: Every day | ORAL | Status: DC

## 2013-04-08 ENCOUNTER — Ambulatory Visit (INDEPENDENT_AMBULATORY_CARE_PROVIDER_SITE_OTHER): Payer: Medicare Other | Admitting: Internal Medicine

## 2013-04-08 ENCOUNTER — Encounter: Payer: Self-pay | Admitting: Internal Medicine

## 2013-04-08 VITALS — BP 128/80 | HR 71 | Temp 98.5°F | Resp 12 | Ht 62.0 in | Wt 230.0 lb

## 2013-04-08 DIAGNOSIS — Z23 Encounter for immunization: Secondary | ICD-10-CM

## 2013-04-08 DIAGNOSIS — E119 Type 2 diabetes mellitus without complications: Secondary | ICD-10-CM | POA: Diagnosis not present

## 2013-04-08 MED ORDER — GLIPIZIDE 5 MG PO TABS: 5.0000 mg | ORAL_TABLET | Freq: Two times a day (BID) | ORAL | Status: DC

## 2013-04-08 MED ORDER — BROMOCRIPTINE MESYLATE 0.8 MG PO TABS: ORAL_TABLET | ORAL | Status: DC

## 2013-04-08 MED ORDER — GLUCOSE BLOOD VI STRP: ORAL_STRIP | Status: DC

## 2013-04-08 MED ORDER — METFORMIN HCL ER 500 MG PO TB24: ORAL_TABLET | ORAL | Status: DC

## 2013-04-08 NOTE — Progress Notes (Signed)
Patient ID: Bethany Miller, female   DOB: 02-15-1946, 67 y.o.   MRN: [id number]  HPI: Bethany Miller is a 67 y.o.-year-old female, referred by her PCP, Dr.Lowne, for management of DM2, non-insulin-dependent, uncontrolled, without complications.  Patient has been diagnosed with diabetes in 2010; she has not been on insulin before. Last hemoglobin A1c was: Lab Results  Component Value Date   HGBA1C 7.5* 01/26/2013    Pt is on a regimen of: - Metformin 500 mg po bid - Cinnamon - Chromium 1000-2000 mg  - Turmeric - Magnesium She was on Metformin but could not tolerated it well b/c diarrhea, she was on it on and off in the past, now on it [place] but was too expensive. She was on Glimepiride >> hypoglycemia in the 60's on 2 mg daily.  Pt checks her sugars once a day and they are: - am: 130-160 - later in the day: can be higher or lower: before dinner: 101-140 (66 - 230) No lows. Lowest sugar was 66, 70; she has hypoglycemia awareness at 80. Highest sugar was 240.  Pt's meals are: - Breakfast: egg/minibagel/grits - Lunch: lunchables - Dinner: fish/meat/veggies - Snacks: She can skip meals - today she did not eat yet (1:30 pm).   - no CKD, last BUN/creatinine:  Lab Results  Component Value Date   BUN 22 01/26/2013   CREATININE 1.1 01/26/2013   - last set of lipids: Lab Results  Component Value Date   CHOL 253* 01/26/2013   HDL 37.50* 01/26/2013   LDLCALC 120* 07/03/2009   LDLDIRECT 149.6 01/26/2013   TRIG 305.0* 01/26/2013   CHOLHDL 7 01/26/2013  Stopped Lipitor 10. - last eye exam was in 07/2012. No DR.  - no numbness and tingling in her feet.  I reviewed her chart and she also has a history of HL, HTN.   Pt has FH of DM in brothers, but pt is adopted.   ROS: Constitutional: no weight gain/loss, no fatigue, no subjective hyperthermia/hypothermia, + poor sleep Eyes: no blurry vision, no xerophthalmia ENT: no sore throat, no nodules palpated in throat, no  dysphagia/odynophagia, no hoarseness, + tinnitus Cardiovascular: no CP/SOB/palpitations/leg swelling Respiratory: no cough/SOB Gastrointestinal: no N/V/D/C Musculoskeletal: no muscle/+ joint aches Skin: no rashes Neurological: no tremors/numbness/tingling/dizziness, + HA - stress Psychiatric: no depression/anxiety Low libido  Past Medical History  Diagnosis Date  . Murmur   . Diabetes mellitus   . Hypertension   . Hyperlipidemia    Past Surgical History  Procedure Laterality Date  . Total knee arthroplasty     History   Social History  . Marital Status: Married    Spouse Name: N/A    Number of Children: N/A  . Years of Education: N/A   Social History Main Topics  . Smoking status: Never Smoker   . Smokeless tobacco: Never Used  . Alcohol Use: Yes  . Drug Use: No  . 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 atorvastatin (LIPITOR) 10 MG tablet Take 1 tablet (10 mg total) by mouth daily.  90 tablet  1  . citalopram (CELEXA) 40 MG tablet Take 1 tablet (40 mg total) by mouth daily.  90 tablet  1  . FreeStyle Unistick II Lancets MISC by Does not apply route.        Marland Kitchen  glucose blood (FREESTYLE INSULINX TEST) test strip Check blood sugar once a day  100 each  12  . glucose blood (FREESTYLE LITE) test strip 1 each by Other route as needed. Use as instructed       . lisinopril-hydrochlorothiazide (PRINZIDE,ZESTORETIC) 10-12.5 MG per tablet TAKE 1 TABLET BY MOUTH EVERY DAY  90 tablet  1  . LORazepam (ATIVAN) 0.5 MG tablet Take 1 tablet (0.5 mg total) by mouth every 8 (eight) hours as needed for anxiety.  30 tablet  1   No current facility-administered medications on file prior to visit.   No Known Allergies  Family History  Problem Relation Age of Onset  . Melanoma    .  Arthritis    . Throat cancer Brother   . Heart disease Brother     MI  . Cancer Brother 80    throat,   . Hyperlipidemia Brother   . Cirrhosis Father   . Alcohol abuse Father   . Heart disease Brother   . Cancer Brother   . Arthritis Mother   . Hypertension Sister   . Arthritis Sister   . Cancer Brother   . Cancer Brother 21    melanoma  . Hypertension Brother   . Hyperlipidemia Brother    PE: BP 128/80  Pulse 71  Temp(Src) 98.5 F (36.9 C) (Oral)  Resp 12  Ht 5\' 2"  (1.575 m)  Wt 230 lb (104.327 kg)  BMI 42.06 kg/m2  SpO2 95% Wt Readings from Last 3 Encounters:  04/08/13 230 lb (104.327 kg)  01/26/13 228 lb 12.8 oz (103.783 kg)  09/13/12 234 lb 6.4 oz (106.323 kg)   Constitutional: obese, in NAD Eyes: PERRLA, EOMI, no exophthalmos ENT: moist mucous membranes, no thyromegaly, no cervical lymphadenopathy Cardiovascular: RRR, No MRG Respiratory: CTA B Gastrointestinal: abdomen soft, NT, ND, BS+ Musculoskeletal: no deformities, strength intact in all 4 Skin: moist, warm, no rashes Neurological: no tremor with outstretched hands, DTR normal in all 4  ASSESSMENT: 1. DM2, non-insulin-dependent, uncontrolled, without complications  PLAN:  1. Patient with uncontrolled diabetes dx 2010, on oral antidiabetic regimen, with problems affording the medicines, and also with intolerance regular metformin. - We discussed about options for treatment, and I suggested to:  Try Cycloset 1.6 mg daily, if it is covered by her insurance. She tells me that she will add a new insurance soon, and she might be able to afford it, even if now it might be too expensive If cannot afford cycloset, start glipizide 5 mg twice daily Try metformin extended release 1000 mg with dinner - Strongly advised her to start checking her sugars at different times of the day - check 2 times a day, rotating checks - given sugar log and advised how to fill it and to bring it at next appt  - given foot care  handout and explained the principles  - given instructions for hypoglycemia management "15-15 rule"  - advised for yearly eye exams - she is up-to-date - Given a new meter and sent Accu-Chek strips to her pharmacy - given flu vaccine - Return to clinic in one month with sugar log

## 2013-04-08 NOTE — Patient Instructions (Addendum)
Please return in 1.5 months with your sugar log.  Try Metformin XR 1000 mg with dinner.  Start Cycloset 0.8 mg daily in am. In a week, increase to 2 tablets (1.6 mg) daily. If you cannot afford this, start Glipizide 5 mg 2x daily with breakfast and dinner.  PATIENT INSTRUCTIONS FOR TYPE 2 DIABETES:  DIET AND EXERCISE Diet and exercise is an important part of diabetic treatment.  We recommended aerobic exercise in the form of brisk walking (working between 40-60% of maximal aerobic capacity, similar to brisk walking) for 150 minutes per week (such as 30 minutes five days per week) along with 3 times per week performing 'resistance' training (using various gauge rubber tubes with handles) 5-10 exercises involving the major muscle groups (upper body, lower body and core) performing 10-15 repetitions (or near fatigue) each exercise. Start at half the above goal but build slowly to reach the above goals. If limited by weight, joint pain, or disability, we recommend daily walking in a swimming pool with water up to waist to reduce pressure from joints while allow for adequate exercise.    BLOOD GLUCOSES Monitoring your blood glucoses is important for continued management of your diabetes. Please check your blood glucoses 2-4 times a day: fasting, before meals and at bedtime (you can rotate these measurements - e.g. one day check before the 3 meals, the next day check before 2 of the meals and before bedtime, etc.   HYPOGLYCEMIA (low blood sugar) Hypoglycemia is usually a reaction to not eating, exercising, or taking too much insulin/ other diabetes drugs.  Symptoms include tremors, sweating, hunger, confusion, headache, etc. Treat IMMEDIATELY with 15 grams of Carbs:   4 glucose tablets    cup regular juice/soda   2 tablespoons raisins   4 teaspoons sugar   1 tablespoon honey Recheck blood glucose in 15 mins and repeat above if still symptomatic/blood glucose <100. Please contact our office at  [phone] if you have questions about how to next handle your insulin.  RECOMMENDATIONS TO REDUCE YOUR RISK OF DIABETIC COMPLICATIONS: * Take your prescribed MEDICATION(S). * Follow a DIABETIC diet: Complex carbs, fiber rich foods, heart healthy fish twice weekly, (monounsaturated and polyunsaturated) fats * AVOID saturated/trans fats, high fat foods, >2,300 mg salt per day. * EXERCISE at least 5 times a week for 30 minutes or preferably daily.  * DO NOT SMOKE OR DRINK more than 1 drink a day. * Check your FEET every day. Do not wear tightfitting shoes. Contact us if you develop an ulcer * See your EYE doctor once a year or more if needed * Get a FLU shot once a year * Get a PNEUMONIA vaccine once before and once after age 50 years  GOALS:  * Your Hemoglobin A1c of <7%  * fasting sugars need to be <130 * after meals sugars need to be <180 (2h after you start eating) * Your Systolic BP should be [id number] or lower  * Your Diastolic BP should be 80 or lower  * Your HDL (Good Cholesterol) should be 40 or higher  * Your LDL (Bad Cholesterol) should be 100 or lower  * Your Triglycerides should be 150 or lower  * Your Urine microalbumin (kidney function) should be <30 * Your Body Mass Index should be 25 or lower   We will be glad to help you achieve these goals. Our telephone number is: [phone].

## 2013-04-08 NOTE — Addendum Note (Signed)
Addended by: Rockie Neighbours B on: 04/08/2013 02:14 PM   Modules accepted: Orders

## 2013-04-18 ENCOUNTER — Other Ambulatory Visit: Payer: Self-pay | Admitting: *Deleted

## 2013-05-03 ENCOUNTER — Other Ambulatory Visit: Payer: Self-pay | Admitting: General Practice

## 2013-05-03 NOTE — Telephone Encounter (Signed)
I advised the pt to start Cycloset and if she cannot afford this to get Glipizide 5 mg bid. She had low CBGs in the past on even 2 mg Amaryl, so refill is not appropriate.

## 2013-05-03 NOTE — Telephone Encounter (Signed)
Please advise if pt should still be on the glimepiride? Per you last phone note this medication had been D/C'd due to pt preference. Received a refill request today.

## 2013-05-03 NOTE — Telephone Encounter (Signed)
Noted  

## 2013-05-23 ENCOUNTER — Other Ambulatory Visit: Payer: Self-pay | Admitting: Family Medicine

## 2013-05-23 ENCOUNTER — Encounter: Payer: Self-pay | Admitting: Internal Medicine

## 2013-05-23 ENCOUNTER — Ambulatory Visit (INDEPENDENT_AMBULATORY_CARE_PROVIDER_SITE_OTHER): Payer: Medicare Other | Admitting: Internal Medicine

## 2013-05-23 VITALS — BP 120/68 | HR 70 | Temp 98.0°F | Resp 10 | Wt 229.0 lb

## 2013-05-23 DIAGNOSIS — E119 Type 2 diabetes mellitus without complications: Secondary | ICD-10-CM

## 2013-05-23 MED ORDER — CANAGLIFLOZIN 100 MG PO TABS: 100.0000 mg | ORAL_TABLET | Freq: Every day | ORAL | Status: DC

## 2013-05-23 NOTE — Patient Instructions (Signed)
Please stay on the Glipizide 5 mg 2x a day. Start Invokana 100 mg daily in am, before breakfast.  Please return in 1 month with your sugar log.  Please stop at the lab.

## 2013-05-23 NOTE — Progress Notes (Signed)
Patient ID: Bethany Miller, female   DOB: 1946-01-03, 67 y.o.   MRN: [id number]  HPI: Bethany Miller is a 67 y.o.-year-old female, returning for f/u for DM2, dx 2010, non-insulin-dependent, uncontrolled, without complications. Last visit 1.5 mo ago.  She was under s lot of stress recently: her best friend since childhood died. Also has marital stress.    Last hemoglobin A1c was: Lab Results  Component Value Date   HGBA1C 7.5* 01/26/2013    Pt is on a regimen of: - Metformin 500 mg po >> switched to ER at last visit so she can take it consistently >> but still severe diarrhea >> she cannot tolerate it  - added Cycloset at last visit >> but could not afford it >> we started Glipizide 5 mg bid - Cinnamon - Chromium 1000-2000 mg  - Turmeric - Magnesium Tried Januvia but was too expensive. She was on Glimepiride >> hypoglycemia in the 60's on 2 mg daily.  Pt checks her sugars once a day and they are: - am: 130-160 >> 136-177, one 75 -  Lunch: 120-138 - Dinnertime: 128-121 No lows. Lowest sugar was 66, 70; she has hypoglycemia awareness at 80. Highest sugar was 240.  Started to go to the gym.   - no CKD, last BUN/creatinine:  Lab Results  Component Value Date   BUN 22 01/26/2013   CREATININE 1.1 01/26/2013   - last set of lipids: Lab Results  Component Value Date   CHOL 253* 01/26/2013   HDL 37.50* 01/26/2013   LDLCALC 120* 07/03/2009   LDLDIRECT 149.6 01/26/2013   TRIG 305.0* 01/26/2013   CHOLHDL 7 01/26/2013  Stopped Lipitor 10. - last eye exam was in 07/2012. No DR.  - no numbness and tingling in her feet.  She also has a history of HL, HTN.   ROS: Constitutional: no weight gain/loss, no fatigue, no subjective hyperthermia/hypothermia, + poor sleep - a lot of stress at home Eyes: no blurry vision, no xerophthalmia ENT: no sore throat, no nodules palpated in throat, no dysphagia/odynophagia, no hoarseness Cardiovascular: no CP/SOB/palpitations/leg swelling Respiratory: no  cough/SOB Gastrointestinal: no N/V/D/C Musculoskeletal: no muscle/joint aches Skin: no rashes Neurological: no tremors/numbness/tingling/dizziness Psychiatric: no depression/anxiety  PE: BP 120/68  Pulse 70  Temp(Src) 98 F (36.7 C) (Oral)  Resp 10  Wt 229 lb (103.874 kg)  SpO2 96% Wt Readings from Last 3 Encounters:  05/23/13 229 lb (103.874 kg)  04/08/13 230 lb (104.327 kg)  01/26/13 228 lb 12.8 oz (103.783 kg)   Constitutional: obese, in NAD Eyes: PERRLA, EOMI, no exophthalmos ENT: moist mucous membranes, no thyromegaly, no cervical lymphadenopathy Cardiovascular: RRR, No MRG Respiratory: CTA B Gastrointestinal: abdomen soft, NT, ND, BS+ Musculoskeletal: no deformities, strength intact in all 4 Skin: moist, warm, no rashes Neurological: no tremor with outstretched hands, DTR normal in all 4  ASSESSMENT: 1. DM2, non-insulin-dependent, uncontrolled, without complications  PLAN:  1. Patient with slightly uncontrolled diabetes, on oral antidiabetic regimen, with problems affording the medicines, and also with intolerance to regular and extended release Metformin. She could not afford Cycloset or Januvia. - We discussed about options for treatment, and I suggested to:  Continue glipizide 5 mg twice daily Start Invokana 100 mg in am  - samples of Invokana given for 1 mo - we discussed about SEs of Invokana, which are: dizziness (advised to be careful when stands from sitting position), decreased BP - usually not < normal (BP today is not low), and fungal UTIs (advised to let  me know if develops one).  - continue checking her sugars at different times of the day - check 2 times a day, rotating checks - given flu vaccine at last visit - check A1c today  - Return to clinic in one month with sugar log   Office Visit on 05/23/2013  Component Date Value Range Status  . Hemoglobin A1C 05/23/2013 7.3* 4.6 - 6.5 % Final   Glycemic Control Guidelines for People with Diabetes:Non  Diabetic:  <6%Goal of Therapy: <7%Additional Action Suggested:  >8%    Improved a little. Will send mag.

## 2013-06-07 ENCOUNTER — Telehealth: Payer: Self-pay | Admitting: Internal Medicine

## 2013-06-07 ENCOUNTER — Other Ambulatory Visit: Payer: Self-pay | Admitting: *Deleted

## 2013-06-07 MED ORDER — GLIPIZIDE 5 MG PO TABS: ORAL_TABLET | ORAL | Status: DC

## 2013-06-07 NOTE — Telephone Encounter (Signed)
Pt called back and requested a new rx for the dosage change [facility] [address] for 31 days. Rx sent e-scribe.

## 2013-06-07 NOTE — Telephone Encounter (Signed)
Pt called and stated she needs to know if she can increase her meds, feels her meds are not doing anything for her sugars, sugars are running high. Returned call to pt. Pt states she is eating healthy and cannot understand why her #'s are running high. Bg's: 167, 162, 151, 132, 145, 151, 172, 90, 152, 252, 264, 194, 135  Pt states she does not want to begin Metformin again due to the diarrhea. Please advise.

## 2013-06-07 NOTE — Telephone Encounter (Signed)
Called pt and lvm advising pt that her to increase Glipizide to 10 mg before b'fast and keep 5 mg before dinner for now.  Do not skip meals. Limit portion size. Eat low glycemic index foods: see www.ClickDebate.gl. Call back if sugars not better in 1 week.

## 2013-06-07 NOTE — Telephone Encounter (Signed)
Bethany Miller, please advise her to increase Glipizide to 10 mg before b'fast and keep 5 mg before dinner for now.  Do not skip meals. Limit portion size. Eat low glycemic index foods: see www.ClickDebate.gl. Call back if sugars not better in 1 week.

## 2013-06-23 ENCOUNTER — Encounter: Payer: Self-pay | Admitting: Internal Medicine

## 2013-06-23 ENCOUNTER — Ambulatory Visit (INDEPENDENT_AMBULATORY_CARE_PROVIDER_SITE_OTHER): Payer: Medicare Other | Admitting: Internal Medicine

## 2013-06-23 VITALS — BP 118/54 | HR 70 | Temp 98.0°F | Ht 62.0 in | Wt 235.0 lb

## 2013-06-23 DIAGNOSIS — E119 Type 2 diabetes mellitus without complications: Secondary | ICD-10-CM | POA: Diagnosis not present

## 2013-06-23 MED ORDER — GLUCOSE BLOOD VI STRP: ORAL_STRIP | Status: DC

## 2013-06-23 MED ORDER — METFORMIN HCL ER 500 MG PO TB24: ORAL_TABLET | ORAL | Status: DC

## 2013-06-23 NOTE — Patient Instructions (Signed)
Please try to take metformin ER 500 mg in am and 500 mg with dinner >> if sugars not improved, then take all together with dinner. Please continue the glipizide 10 mg in am and 5 mg in pm.  Please come for labs in 3 months before the next visit.

## 2013-06-23 NOTE — Progress Notes (Signed)
Patient ID: Bethany Miller, female   DOB: Dec 15, 1945, 67 y.o.   MRN: [id number]  HPI: Bethany Miller is a 67 y.o.-year-old female, returning for f/u for DM2, dx 2010, non-insulin-dependent, uncontrolled, without complications. Last visit 1.5 mo ago.    Last hemoglobin A1c was: Lab Results  Component Value Date   HGBA1C 7.3* 05/23/2013   HGBA1C 7.5* 01/26/2013   HGBA1C 7.6* 03/24/2012    Pt is on a regimen of: - restarted Metformin ER 1000 mg -tolerates it well - Glipizide 10 mg in am and 5 mg in pm - Cinnamon - Chromium 1000-2000 mg  - Turmeric - Magnesium Started Invokana 100 at last visit >> stopped 1 week ago  Tried Cycloset >> but could not afford it  [employer] but was too expensive. Tried Metformin 500 mg po >> switched to ER at last visit so she can take it consistently >> but still severe diarrhea >> she could not tolerate it She was on Glimepiride >> hypoglycemia in the 60's on 2 mg daily.  Pt checks her sugars once a day and they were close to goal but ran out : - am: 130-160 >> 136-177, one 75 >> 120 after starting Metformin -  Lunch: 120-138 >> low 100s - Dinnertime: 128-121 >> low 100s - 2h after dinner: 170-180 No lows. Lowest sugar was 66, 70; she has hypoglycemia awareness at 80. Highest sugar was 180.  Started to go to the gym >> joined a gym that is all her way home from work so it's easier to stop when coming home at night.  - Mild CKD, last BUN/creatinine:  Lab Results  Component Value Date   BUN 22 01/26/2013   CREATININE 1.1 01/26/2013   - last set of lipids: Lab Results  Component Value Date   CHOL 253* 01/26/2013   HDL 37.50* 01/26/2013   LDLCALC 120* 07/03/2009   LDLDIRECT 149.6 01/26/2013   TRIG 305.0* 01/26/2013   CHOLHDL 7 01/26/2013  Stopped Lipitor 10. - last eye exam was in 07/2012. No DR.  - no numbness and tingling in her feet.  She also has a history of HL, HTN.   ROS: Constitutional: no weight gain/loss, no fatigue, no subjective  hyperthermia/hypothermia Eyes: no blurry vision, no xerophthalmia ENT: no sore throat, no nodules palpated in throat, no dysphagia/odynophagia, no hoarseness Cardiovascular: no CP/SOB/palpitations/leg swelling Respiratory: no cough/SOB Gastrointestinal: no N/V/D/C Musculoskeletal: no muscle/joint aches Skin: no rashes  I reviewed pt's medications, allergies, PMH, social hx, family hx and no changes required, except as mentioned above.  PE: BP 118/54  Pulse 70  Temp(Src) 98 F (36.7 C) (Oral)  Ht 5\' 2"  (1.575 m)  Wt 235 lb (106.595 kg)  BMI 42.97 kg/m2  SpO2 97% Wt Readings from Last 3 Encounters:  06/23/13 235 lb (106.595 kg)  05/23/13 229 lb (103.874 kg)  04/08/13 230 lb (104.327 kg)   Constitutional: obese, in NAD Eyes: PERRLA, EOMI, no exophthalmos ENT: moist mucous membranes, no thyromegaly, no cervical lymphadenopathy Cardiovascular: RRR, No MRG Respiratory: CTA B Gastrointestinal: abdomen soft, NT, ND, BS+ Musculoskeletal: no deformities, strength intact in all 4 Skin: moist, warm, no rashes Neurological: no tremor with outstretched hands, DTR normal in all 4  ASSESSMENT: 1. DM2, non-insulin-dependent, uncontrolled, without complications  PLAN:  1. Patient with slightly uncontrolled diabetes, on oral antidiabetic regimen, with better control lately, although she does not bring a log today. She noticed that she can tolerate metformin extended release now, which is started a month  ago. I believe her improved tolerance is probably due to changing the manufacturer for the medication.  - We discussed about options for treatment, and I suggested to:  Patient Instructions  Please try to take metformin ER 500 mg in am and 500 mg with dinner >> if sugars not improved, then take it all together with dinner. Please continue the glipizide 10 mg in am and 5 mg in pm.  Please come for labs in 3 months before the next visit. - continue checking her sugars at different times of the  day - check 2-3  times a day, rotating checks - refilled strips and metformin - had flu vaccine this season - check A1c in 3 mo - Return to clinic in 3 mo with sugar log

## 2013-07-11 ENCOUNTER — Other Ambulatory Visit: Payer: Self-pay | Admitting: Internal Medicine

## 2013-07-12 ENCOUNTER — Other Ambulatory Visit: Payer: Self-pay | Admitting: *Deleted

## 2013-07-12 MED ORDER — METFORMIN HCL ER 500 MG PO TB24: ORAL_TABLET | ORAL | Status: DC

## 2013-07-12 MED ORDER — GLIPIZIDE 5 MG PO TABS: ORAL_TABLET | ORAL | Status: DC

## 2013-07-12 NOTE — Telephone Encounter (Signed)
Pt called and asked for clarification for taking her metformin. Asked to have the wording changed on her rx and a 90 day refill sent to her pharmacy. Requested and rx refill for a 90 day supply of glipizide as well. Done.

## 2013-07-22 DIAGNOSIS — R350 Frequency of micturition: Secondary | ICD-10-CM | POA: Diagnosis not present

## 2013-07-22 DIAGNOSIS — N390 Urinary tract infection, site not specified: Secondary | ICD-10-CM | POA: Diagnosis not present

## 2013-07-22 DIAGNOSIS — R300 Dysuria: Secondary | ICD-10-CM | POA: Diagnosis not present

## 2013-07-22 DIAGNOSIS — R3915 Urgency of urination: Secondary | ICD-10-CM | POA: Diagnosis not present

## 2013-08-02 ENCOUNTER — Other Ambulatory Visit: Payer: Self-pay | Admitting: Family Medicine

## 2013-09-05 ENCOUNTER — Ambulatory Visit (INDEPENDENT_AMBULATORY_CARE_PROVIDER_SITE_OTHER): Payer: Medicare Other | Admitting: Physician Assistant

## 2013-09-05 ENCOUNTER — Encounter: Payer: Self-pay | Admitting: Physician Assistant

## 2013-09-05 VITALS — BP 121/72 | HR 79 | Temp 97.9°F | Wt 228.0 lb

## 2013-09-05 DIAGNOSIS — J329 Chronic sinusitis, unspecified: Secondary | ICD-10-CM

## 2013-09-05 MED ORDER — FLUTICASONE PROPIONATE 50 MCG/ACT NA SUSP: 2.0000 | Freq: Every day | NASAL | Status: DC

## 2013-09-05 MED ORDER — AMOXICILLIN-POT CLAVULANATE 875-125 MG PO TABS: 1.0000 | ORAL_TABLET | Freq: Two times a day (BID) | ORAL | Status: DC

## 2013-09-05 NOTE — Assessment & Plan Note (Signed)
Rx Augmentin. Rx Flonase.  Increase fluid intake.  Rest.  Saline nasal spray.  Mucinex.  Humidifier in bedroom.  Call or return to clinic if symptoms are not improving.

## 2013-09-05 NOTE — Progress Notes (Signed)
Patient presents to clinic today c/o sinus pressure, sinus pain, PND, cough now productive of sputum and chills x 1-2 weeks.  Patient denies shortness of breath or wheezing.  Denies fever, aches.  Endorses some malaise.  Patient denies recent travel or sick contact.   Past Medical History  Diagnosis Date  . Murmur   . Diabetes mellitus   . Hypertension   . Hyperlipidemia     Current Outpatient Prescriptions on File Prior to Visit  Medication Sig Dispense Refill  . atorvastatin (LIPITOR) 10 MG tablet 1 tab by mouth daily--labs are due now  90 tablet  0  . Bromocriptine Mesylate 0.8 MG TABS Take 1.6 mg by mouth daily in am  60 tablet  3  . citalopram (CELEXA) 40 MG tablet Take 1 tablet (40 mg total) by mouth daily.  90 tablet  1  . FreeStyle Unistick II Lancets MISC by Does not apply route.        Marland Kitchen glimepiride (AMARYL) 2 MG tablet       . glipiZIDE (GLUCOTROL) 5 MG tablet Take 2 tablets (10 mg) with breakfast and 1 tablet (5 mg) with dinner.  279 tablet  0  . glucose blood (FREESTYLE INSULINX TEST) test strip Check blood sugar once a day  300 each  3  . lisinopril-hydrochlorothiazide (PRINZIDE,ZESTORETIC) 10-12.5 MG per tablet TAKE 1 TABLET BY MOUTH EVERY DAY  90 tablet  1  . LORazepam (ATIVAN) 0.5 MG tablet Take 1 tablet (0.5 mg total) by mouth every 8 (eight) hours as needed for anxiety.  30 tablet  1  . metFORMIN (GLUCOPHAGE XR) 500 MG 24 hr tablet Take 1 tablet (500mg ) in the morning and 1 tablet (500mg ) at dinner.  186 tablet  0  . Canagliflozin (INVOKANA) 100 MG TABS Take 1 tablet (100 mg total) by mouth daily before breakfast.  1 tablet  0   No current facility-administered medications on file prior to visit.    No Known Allergies  Family History  Problem Relation Age of Onset  . Melanoma    . Arthritis    . Throat cancer Brother   . Heart disease Brother     MI  . Cancer Brother 80    throat,   . Hyperlipidemia Brother   . Cirrhosis Father   . Alcohol abuse Father   .  Heart disease Brother   . Cancer Brother   . Arthritis Mother   . Hypertension Sister   . Arthritis Sister   . Cancer Brother   . Cancer Brother 61    melanoma  . Hypertension Brother   . Hyperlipidemia Brother     History   Social History  . Marital Status: Married    Spouse Name: N/A    Number of Children: N/A  . Years of Education: N/A   Social History Main Topics  . Smoking status: Never Smoker   . Smokeless tobacco: Never Used  . Alcohol Use: Yes  . Drug Use: No  . Sexual Activity: Yes    Partners: Male   Other Topics Concern  . 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  All other ROS are negative.  BP 121/72  Pulse 79  Temp(Src) 97.9 F (36.6 C) (Oral)  Wt 228 lb (103.42 kg)  SpO2 94%  Physical Exam  Vitals reviewed. Constitutional: She is oriented to  person, place, and time and well-developed, well-nourished, and in no distress.  HENT:  Head: Normocephalic and atraumatic.  Right Ear: External ear normal.  Left Ear: External ear normal.  Nose: Nose normal.  Mouth/Throat: Oropharynx is clear and moist. No oropharyngeal exudate.  TM within normal limits bilaterally. TTP of maxillary sinuses bilaterally.  Eyes: Conjunctivae are normal. Pupils are equal, round, and reactive to light.  Neck: Neck supple.  Cardiovascular: Normal rate, regular rhythm and normal heart sounds.   Pulmonary/Chest: Effort normal and breath sounds normal. No respiratory distress. She has no wheezes. She has no rales. She exhibits no tenderness.  Neurological: She is alert and oriented to person, place, and time. No cranial nerve deficit.  Skin: Skin is warm and dry. No rash noted.  Psychiatric: Affect normal.    No results found for this or any previous visit (from the past 2160 hour(s)).  Assessment/Plan: Sinusitis Rx Augmentin. Rx Flonase.  Increase fluid intake.  Rest.   Saline nasal spray.  Mucinex.  Humidifier in bedroom.  Call or return to clinic if symptoms are not improving.

## 2013-09-05 NOTE — Patient Instructions (Signed)
Take antibiotic and Flonase as directed.  Increase fluid intake.  Rest.  Use Mucinex for congestion.  Place a humidifier in the bedroom.  Call or return to clinic if symptoms are not improving.

## 2013-09-20 ENCOUNTER — Other Ambulatory Visit: Payer: Self-pay | Admitting: Family Medicine

## 2013-09-22 ENCOUNTER — Other Ambulatory Visit: Payer: Self-pay | Admitting: *Deleted

## 2013-09-22 MED ORDER — METFORMIN HCL ER 500 MG PO TB24: ORAL_TABLET | ORAL | Status: DC

## 2013-09-22 MED ORDER — GLIPIZIDE 5 MG PO TABS: ORAL_TABLET | ORAL | Status: DC

## 2013-09-26 ENCOUNTER — Other Ambulatory Visit: Payer: Self-pay

## 2013-09-26 ENCOUNTER — Encounter: Payer: Self-pay | Admitting: Internal Medicine

## 2013-09-26 ENCOUNTER — Ambulatory Visit (INDEPENDENT_AMBULATORY_CARE_PROVIDER_SITE_OTHER): Payer: Medicare Other | Admitting: Internal Medicine

## 2013-09-26 ENCOUNTER — Other Ambulatory Visit: Payer: Self-pay | Admitting: Internal Medicine

## 2013-09-26 VITALS — BP 118/62 | HR 72 | Temp 98.1°F | Resp 12 | Wt 231.0 lb

## 2013-09-26 DIAGNOSIS — E119 Type 2 diabetes mellitus without complications: Secondary | ICD-10-CM

## 2013-09-26 DIAGNOSIS — J329 Chronic sinusitis, unspecified: Secondary | ICD-10-CM

## 2013-09-26 MED ORDER — METFORMIN HCL ER 500 MG PO TB24: ORAL_TABLET | ORAL | Status: DC

## 2013-09-26 MED ORDER — LISINOPRIL-HYDROCHLOROTHIAZIDE 10-12.5 MG PO TABS: ORAL_TABLET | ORAL | Status: DC

## 2013-09-26 MED ORDER — FLUTICASONE PROPIONATE 50 MCG/ACT NA SUSP: 2.0000 | Freq: Every day | NASAL | Status: DC

## 2013-09-26 MED ORDER — GLIPIZIDE 5 MG PO TABS: ORAL_TABLET | ORAL | Status: DC

## 2013-09-26 MED ORDER — CITALOPRAM HYDROBROMIDE 40 MG PO TABS: ORAL_TABLET | ORAL | Status: DC

## 2013-09-26 NOTE — Progress Notes (Signed)
Patient ID: Bethany Miller, female   DOB: [birth year], 68 y.o.   MRN: [id number]  HPI: Bethany Miller is a 68 y.o.-year-old female, returning for f/u for DM2, dx 2010, non-insulin-dependent, uncontrolled, without complications. Last visit 3 mo ago.    Last hemoglobin A1c was: Lab Results  Component Value Date   HGBA1C 7.3* 05/23/2013   HGBA1C 7.5* 01/26/2013   HGBA1C 7.6* 03/24/2012    Pt is on a regimen of: - Metformin ER 500 mg bid - tolerates it well - Glipizide 10 mg in am and 5 mg in pm - Cinnamon - Chromium 1000-2000 mg  - Turmeric - Magnesium Started Invokana 100 at last visit >> stopped 1 week ago  Tried Cycloset >> but could not afford it  [employer] but was too expensive. Tried Metformin 500 mg po >> switched to ER at last visit so she can take it consistently >> but still severe diarrhea >> she could not tolerate it She was on Glimepiride >> hypoglycemia in the 60's on 2 mg daily.  She now has [employer] for meds and [employer] for the rest.  Pt checks her sugars once a day  - stopped 3 weeks ago, and they were (no log) : - am: 130-160 >> 136-177, one 75 >> 120 after starting Metformin >> 143-191 - Lunch: 120-138 >> low 100s >> n/c - Dinnertime: 128-121 >> low 100s >> 67, 69, 77, 91, 101 - 2h after dinner: 170-180 No lows. Lowest sugar was 67 - in late pm  she has hypoglycemia awareness at 80. Highest sugar was 180.  At last visit, she tarted to go to the gym >> joined a gym that is all her way home from work so it's easier to stop when coming home at night >> not consistently now during winter.  - Mild CKD, last BUN/creatinine:  Lab Results  Component Value Date   BUN 22 01/26/2013   CREATININE 1.1 01/26/2013   - last set of lipids: Lab Results  Component Value Date   CHOL 253* 01/26/2013   HDL 37.50* 01/26/2013   LDLCALC 120* 07/03/2009   LDLDIRECT 149.6 01/26/2013   TRIG 305.0* 01/26/2013   CHOLHDL 7 01/26/2013  Stopped Lipitor 10. - last eye exam was in  07/2012. No DR.  - no numbness and tingling in her feet.  She also has a history of HL, HTN.   ROS: Constitutional: no weight gain/loss, no fatigue, no subjective hyperthermia/hypothermia Eyes: no blurry vision, no xerophthalmia ENT: no sore throat, no nodules palpated in throat, no dysphagia/odynophagia, no hoarseness Cardiovascular: no CP/SOB/palpitations/leg swelling Respiratory: no cough/SOB Gastrointestinal: no N/V/D/C Musculoskeletal: no muscle/joint aches Skin: no rashes  I reviewed pt's medications, allergies, PMH, social hx, family hx and no changes required, except as mentioned above.  PE: BP 118/62  Pulse 72  Temp(Src) 98.1 F (36.7 C) (Oral)  Resp 12  Wt 231 lb (104.781 kg)  SpO2 95% Wt Readings from Last 3 Encounters:  09/26/13 231 lb (104.781 kg)  09/05/13 228 lb (103.42 kg)  06/23/13 235 lb (106.595 kg)   Constitutional: obese, in NAD Eyes: PERRLA, EOMI, no exophthalmos ENT: moist mucous membranes, no thyromegaly, no cervical lymphadenopathy Cardiovascular: RRR, No MRG Respiratory: CTA B Gastrointestinal: abdomen soft, NT, ND, BS+ Musculoskeletal: no deformities, strength intact in all 4 Skin: moist, warm, no rashes Neurological: no tremor with outstretched hands, DTR normal in all 4  ASSESSMENT: 1. DM2, non-insulin-dependent, uncontrolled, without complications  PLAN:  1. Patient with slightly uncontrolled  diabetes, on oral antidiabetic regimen, with better control lately, although she again does not bring a log today. She can tolerate metformin extended release now. She has higher sugars in am and lows in late afternoon. - We discussed about options for treatment, and I suggested to:  Patient Instructions  Please reduce the Glipizide to 5 mg 2x a day. Please increase the Mteformin ER to 1000 mg 2x a day. Please return in 3 month with your sugar log.  Stop at [facility] lab.  - continue checking her sugars at different times of the day - check once a  day, rotating checks - refilled glipizide and metformin ER - had flu vaccine this season - check A1c today (also req's a U/A as she has urgency) - Return to clinic in 3 mo with sugar log   Orders Only on 09/26/2013  Component Date Value Ref Range Status  . Hemoglobin A1C 09/26/2013 7.2* <5.7 % Final   Comment:                                                                                                 According to the ADA Clinical Practice Recommendations for 2011, when                          HbA1c is used as a screening test:                                                       >=6.5%   Diagnostic of Diabetes Mellitus                                     (if abnormal result is confirmed)                                                     5.7-6.4%   Increased risk of developing Diabetes Mellitus                                                     References:Diagnosis and Classification of Diabetes Mellitus,Diabetes                          [id number] 1):S62-S69 and Standards of Medical Care in                                  Diabetes - 2011,Diabetes [id number] (Suppl 1):S11-S61.                             Marland Kitchen  Mean Plasma Glucose 09/26/2013 160* <117 mg/dL Final  . Color, Urine 09/26/2013 YELLOW  YELLOW Final  . APPearance 09/26/2013 CLOUDY* CLEAR Final  . Specific Gravity, Urine 09/26/2013 1.020  1.005 - 1.030 Final  . pH 09/26/2013 7.0  5.0 - 8.0 Final  . Glucose, UA 09/26/2013 NEG  NEG mg/dL Final  . Bilirubin Urine 09/26/2013 NEG  NEG Final  . Ketones, ur 09/26/2013 NEG  NEG mg/dL Final  . Hgb urine dipstick 09/26/2013 NEG  NEG Final  . Protein, ur 09/26/2013 NEG  NEG mg/dL Final  . Urobilinogen, UA 09/26/2013 0.2  0.0 - 1.0 mg/dL Final  . Nitrite 09/26/2013 POS* NEG Final  . Leukocytes, UA 09/26/2013 NEG  NEG Final   Dear [name], HbA1c stable or even a little better! You do have a urinary tract infection >> start Ciprofloxacin 500 mg 2x a day for 5 days and  follow with your PCP if symptoms not better. Sincerely, Philemon Kingdom MD

## 2013-09-26 NOTE — Patient Instructions (Signed)
Please reduce the Glipizide to 5 mg 2x a day. Please increase the Mteformin ER to 1000 mg 2x a day. Please return in 3 month with your sugar log.  Stop at [facility] lab.

## 2013-09-27 LAB — URINALYSIS
BILIRUBIN URINE: NEGATIVE
Glucose, UA: NEGATIVE mg/dL
Hgb urine dipstick: NEGATIVE
KETONES UR: NEGATIVE mg/dL
Leukocytes, UA: NEGATIVE
Nitrite: POSITIVE — AB
Protein, ur: NEGATIVE mg/dL
SPECIFIC GRAVITY, URINE: 1.02 (ref 1.005–1.030)
UROBILINOGEN UA: 0.2 mg/dL (ref 0.0–1.0)
pH: 7 (ref 5.0–8.0)

## 2013-09-27 LAB — HEMOGLOBIN A1C
HEMOGLOBIN A1C: 7.2 % — AB (ref <5.7)
Mean Plasma Glucose: 160 mg/dL — ABNORMAL HIGH (ref <117)

## 2013-09-27 MED ORDER — CIPROFLOXACIN HCL 500 MG PO TABS: 500.0000 mg | ORAL_TABLET | Freq: Two times a day (BID) | ORAL | Status: DC

## 2013-12-23 ENCOUNTER — Encounter: Payer: Self-pay | Admitting: Family Medicine

## 2013-12-23 ENCOUNTER — Ambulatory Visit (INDEPENDENT_AMBULATORY_CARE_PROVIDER_SITE_OTHER): Payer: Medicare Other | Admitting: Family Medicine

## 2013-12-23 VITALS — BP 122/70 | HR 69 | Temp 98.1°F | Resp 19 | Wt 225.0 lb

## 2013-12-23 DIAGNOSIS — E785 Hyperlipidemia, unspecified: Secondary | ICD-10-CM | POA: Diagnosis not present

## 2013-12-23 DIAGNOSIS — R82998 Other abnormal findings in urine: Secondary | ICD-10-CM | POA: Diagnosis not present

## 2013-12-23 DIAGNOSIS — I10 Essential (primary) hypertension: Secondary | ICD-10-CM

## 2013-12-23 DIAGNOSIS — E669 Obesity, unspecified: Secondary | ICD-10-CM | POA: Insufficient documentation

## 2013-12-23 DIAGNOSIS — R8290 Unspecified abnormal findings in urine: Secondary | ICD-10-CM

## 2013-12-23 DIAGNOSIS — E1159 Type 2 diabetes mellitus with other circulatory complications: Secondary | ICD-10-CM | POA: Diagnosis not present

## 2013-12-23 LAB — POCT URINALYSIS DIPSTICK
Bilirubin, UA: NEGATIVE
Blood, UA: NEGATIVE
Glucose, UA: NEGATIVE
KETONES UA: NEGATIVE
Nitrite, UA: POSITIVE
PROTEIN UA: NEGATIVE
SPEC GRAV UA: 1.02
Urobilinogen, UA: NEGATIVE
pH, UA: 5

## 2013-12-23 LAB — BASIC METABOLIC PANEL
BUN: 22 mg/dL (ref 6–23)
CO2: 24 mEq/L (ref 19–32)
Calcium: 9.1 mg/dL (ref 8.4–10.5)
Chloride: 103 mEq/L (ref 96–112)
Creat: 1.15 mg/dL — ABNORMAL HIGH (ref 0.50–1.10)
Glucose, Bld: 74 mg/dL (ref 70–99)
POTASSIUM: 4.1 meq/L (ref 3.5–5.3)
SODIUM: 137 meq/L (ref 135–145)

## 2013-12-23 LAB — LIPID PANEL
CHOL/HDL RATIO: 6.8 ratio
CHOLESTEROL: 226 mg/dL — AB (ref 0–200)
HDL: 33 mg/dL — AB (ref >39)
LDL Cholesterol: 125 mg/dL — ABNORMAL HIGH (ref 0–99)
Triglycerides: 341 mg/dL — ABNORMAL HIGH (ref <150)
VLDL: 68 mg/dL — ABNORMAL HIGH (ref 0–40)

## 2013-12-23 LAB — HEPATIC FUNCTION PANEL
ALT: 16 U/L (ref 0–35)
AST: 18 U/L (ref 0–37)
Albumin: 4.4 g/dL (ref 3.5–5.2)
Alkaline Phosphatase: 82 U/L (ref 39–117)
Bilirubin, Direct: 0.1 mg/dL (ref 0.0–0.3)
Indirect Bilirubin: 0.2 mg/dL (ref 0.2–1.2)
TOTAL PROTEIN: 7.1 g/dL (ref 6.0–8.3)
Total Bilirubin: 0.3 mg/dL (ref 0.2–1.2)

## 2013-12-23 LAB — HEMOGLOBIN A1C
Hgb A1c MFr Bld: 7.3 % — ABNORMAL HIGH (ref <5.7)
MEAN PLASMA GLUCOSE: 163 mg/dL — AB (ref <117)

## 2013-12-23 MED ORDER — ATORVASTATIN CALCIUM 10 MG PO TABS: ORAL_TABLET | ORAL | Status: DC

## 2013-12-23 NOTE — Progress Notes (Signed)
Subjective:    Patient ID: Bethany Miller, female    DOB: [birth year], 68 y.o.   MRN: [id number]  HPI  HPI HYPERTENSION  Blood pressure range-not checking  Chest pain- no      Dyspnea- no Lightheadedness- no   Edema- no Other side effects - no   Medication compliance: good Low salt diet- yes  DIABETES  Blood Sugar ranges-good per pt  Polyuria- no New Visual problems- no Hypoglycemic symptoms- no Other side effects-no Medication compliance - no Last eye exam- due Foot exam- today  HYPERLIPIDEMIA  Medication compliance- good RUQ pain- no  Muscle aches- no Other side effects-no  Review of Systems  Constitutional: Negative for diaphoresis, appetite change, fatigue and unexpected weight change.  Eyes: Negative for pain, redness and visual disturbance.  Respiratory: Negative for cough, chest tightness, shortness of breath and wheezing.   Cardiovascular: Negative for chest pain, palpitations and leg swelling.  Endocrine: Negative for cold intolerance, heat intolerance, polydipsia, polyphagia and polyuria.  Genitourinary: Negative for dysuria, frequency and difficulty urinating.  Neurological: Negative for dizziness, light-headedness, numbness and headaches.   Past Medical History  Diagnosis Date  . Murmur   . Diabetes mellitus   . Hypertension   . Hyperlipidemia    History   Social History  . Marital Status: Married    Spouse Name: N/A    Number of Children: N/A  . Years of Education: N/A   Occupational History  . Not on file.   Social History Main Topics  . Smoking status: Never Smoker   . Smokeless tobacco: Never Used  . Alcohol Use: Yes  . Drug Use: No  . Sexual Activity: Yes    Partners: Male   Other Topics Concern  . 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 Melanoma    . Arthritis    . Throat cancer Brother   . Heart  disease Brother     MI  . Cancer Brother 80    throat,   . Hyperlipidemia Brother   . Cirrhosis Father   . Alcohol abuse Father   . Heart disease Brother   . Cancer Brother   . Arthritis Mother   . Hypertension Sister   . Arthritis Sister   . Cancer Brother   . Cancer Brother 60    melanoma  . Hypertension Brother   . Hyperlipidemia Brother    Current Outpatient Prescriptions  Medication Sig Dispense Refill  . ciprofloxacin (CIPRO) 500 MG tablet Take 1 tablet (500 mg total) by mouth 2 (two) times daily. For 5 days  10 tablet  0  . citalopram (CELEXA) 40 MG tablet TAKE 1 TABLET (40 MG TOTAL) BY MOUTH DAILY.  90 tablet  1  . fluticasone (FLONASE) 50 MCG/ACT nasal spray Place 2 sprays into both nostrils daily.  48 g  1  . FreeStyle Unistick II Lancets MISC by Does not apply route.        Marland Kitchen glucose blood (FREESTYLE INSULINX TEST) test strip Check blood sugar once a day  300 each  3  . lisinopril-hydrochlorothiazide (PRINZIDE,ZESTORETIC) 10-12.5 MG per tablet TAKE 1 TABLET BY MOUTH EVERY DAY  90 tablet  1  . LORazepam (ATIVAN) 0.5 MG tablet Take 1 tablet (0.5 mg total) by mouth every 8 (eight) hours as needed for anxiety.  30 tablet  1  . metFORMIN (GLUCOPHAGE XR) 500 MG 24 hr tablet Take 2 tablet (1000mg ) in the morning and 2 tablet (1000mg ) at dinner.  180 tablet  3  . atorvastatin (LIPITOR) 10 MG tablet 1 tab by mouth daily--  30 tablet  1  . glipiZIDE (GLUCOTROL) 5 MG tablet Take 1 tablets (5 mg) 2x a day by mouth.  180 tablet  3   No current facility-administered medications for this visit.       Objective:   Physical Exam  Constitutional: She is oriented to person, place, and time. She appears well-developed and well-nourished.  HENT:  Head: Normocephalic and atraumatic.  Eyes: Conjunctivae and EOM are normal.  Neck: Normal range of motion. Neck supple. No JVD present. Carotid bruit is not present. No thyromegaly present.  Cardiovascular: Normal rate and regular rhythm.     Murmur heard. Pulmonary/Chest: Effort normal and breath sounds normal. No respiratory distress. She has no wheezes. She has no rales. She exhibits no tenderness.  Musculoskeletal: She exhibits no edema.  Neurological: She is alert and oriented to person, place, and time.  Psychiatric: She has a normal mood and affect.          Assessment & Plan:  1. Other and unspecified hyperlipidemia Check labs, con't meds - POCT urinalysis dipstick - atorvastatin (LIPITOR) 10 MG tablet; 1 tab by mouth daily--  Dispense: 30 tablet; Refill: 1 - Hepatic function panel - Basic Metabolic Panel (BMET) - HgB A1c - Lipid Profile  2. Type II or unspecified type diabetes mellitus with peripheral circulatory disorders, uncontrolled(250.72) Check labs - POCT urinalysis dipstick - Hepatic function panel - Basic Metabolic Panel (BMET) - HgB A1c - Lipid Profile  3. HTN (hypertension) stable - Hepatic function panel - Basic Metabolic Panel (BMET) - HgB A1c - Lipid Profile  4. Abnormal urinalysis  - Urine Culture

## 2013-12-23 NOTE — Patient Instructions (Signed)

## 2013-12-23 NOTE — Progress Notes (Signed)
Pre-visit discussion using our clinic review tool, as applicable. No additional management support is needed unless otherwise documented below in the visit note.  

## 2013-12-24 ENCOUNTER — Telehealth: Payer: Self-pay | Admitting: Family Medicine

## 2013-12-24 NOTE — Telephone Encounter (Signed)
Relevant patient education assigned to patient using Emmi. ° °

## 2013-12-26 LAB — URINE CULTURE

## 2013-12-28 ENCOUNTER — Other Ambulatory Visit: Payer: Self-pay

## 2013-12-28 DIAGNOSIS — E785 Hyperlipidemia, unspecified: Secondary | ICD-10-CM

## 2013-12-28 MED ORDER — ATORVASTATIN CALCIUM 20 MG PO TABS: 20.0000 mg | ORAL_TABLET | Freq: Every day | ORAL | Status: DC

## 2013-12-28 MED ORDER — CIPROFLOXACIN HCL 500 MG PO TABS
500.0000 mg | ORAL_TABLET | Freq: Two times a day (BID) | ORAL | Status: DC
Start: 2013-12-28 — End: 2015-11-16

## 2014-02-10 ENCOUNTER — Telehealth: Payer: Self-pay

## 2014-02-10 NOTE — Telephone Encounter (Signed)
Pt states that time will work.  Thanks!

## 2014-02-10 NOTE — Telephone Encounter (Signed)
Diabetic Bundle. Called to schedule follow up visit with Dr. Cruzita Lederer. Pt will have to come into town on 02/24/2014 and wanted to know if she could be worked into her schedule after 3pm on that day.  Please advise if ok to proceed with visit.  Thanks!

## 2014-02-10 NOTE — Telephone Encounter (Signed)
We have put her in for the only available appt, at 9:15 am on that date. Please advise if time ok. Thank you.

## 2014-02-24 ENCOUNTER — Ambulatory Visit: Payer: Medicare Other | Admitting: Internal Medicine

## 2014-03-06 ENCOUNTER — Encounter: Payer: Self-pay | Admitting: General Practice

## 2014-04-04 ENCOUNTER — Other Ambulatory Visit: Payer: Self-pay | Admitting: *Deleted

## 2014-04-04 MED ORDER — METFORMIN HCL ER 500 MG PO TB24: ORAL_TABLET | ORAL | Status: DC

## 2014-05-21 DIAGNOSIS — Z23 Encounter for immunization: Secondary | ICD-10-CM | POA: Diagnosis not present

## 2014-05-30 ENCOUNTER — Encounter: Payer: Self-pay | Admitting: Family Medicine

## 2014-06-04 DIAGNOSIS — N390 Urinary tract infection, site not specified: Secondary | ICD-10-CM | POA: Diagnosis not present

## 2014-06-13 DIAGNOSIS — I10 Essential (primary) hypertension: Secondary | ICD-10-CM | POA: Diagnosis not present

## 2014-06-13 DIAGNOSIS — E119 Type 2 diabetes mellitus without complications: Secondary | ICD-10-CM | POA: Diagnosis not present

## 2014-06-13 DIAGNOSIS — Z1239 Encounter for other screening for malignant neoplasm of breast: Secondary | ICD-10-CM | POA: Diagnosis not present

## 2014-06-13 DIAGNOSIS — E785 Hyperlipidemia, unspecified: Secondary | ICD-10-CM | POA: Diagnosis not present

## 2014-06-13 DIAGNOSIS — I38 Endocarditis, valve unspecified: Secondary | ICD-10-CM | POA: Diagnosis not present

## 2014-06-16 DIAGNOSIS — Z1231 Encounter for screening mammogram for malignant neoplasm of breast: Secondary | ICD-10-CM | POA: Diagnosis not present

## 2014-06-16 DIAGNOSIS — I38 Endocarditis, valve unspecified: Secondary | ICD-10-CM | POA: Diagnosis not present

## 2014-06-30 DIAGNOSIS — I10 Essential (primary) hypertension: Secondary | ICD-10-CM | POA: Diagnosis not present

## 2014-06-30 DIAGNOSIS — B351 Tinea unguium: Secondary | ICD-10-CM | POA: Diagnosis not present

## 2014-06-30 DIAGNOSIS — E785 Hyperlipidemia, unspecified: Secondary | ICD-10-CM | POA: Diagnosis not present

## 2014-06-30 DIAGNOSIS — E119 Type 2 diabetes mellitus without complications: Secondary | ICD-10-CM | POA: Diagnosis not present

## 2014-10-05 DIAGNOSIS — I10 Essential (primary) hypertension: Secondary | ICD-10-CM | POA: Diagnosis not present

## 2014-10-05 DIAGNOSIS — E119 Type 2 diabetes mellitus without complications: Secondary | ICD-10-CM | POA: Diagnosis not present

## 2014-10-05 DIAGNOSIS — E785 Hyperlipidemia, unspecified: Secondary | ICD-10-CM | POA: Diagnosis not present

## 2014-10-18 DIAGNOSIS — L570 Actinic keratosis: Secondary | ICD-10-CM | POA: Diagnosis not present

## 2014-10-18 DIAGNOSIS — G4700 Insomnia, unspecified: Secondary | ICD-10-CM | POA: Diagnosis not present

## 2014-10-18 DIAGNOSIS — E119 Type 2 diabetes mellitus without complications: Secondary | ICD-10-CM | POA: Diagnosis not present

## 2014-10-18 DIAGNOSIS — E785 Hyperlipidemia, unspecified: Secondary | ICD-10-CM | POA: Diagnosis not present

## 2014-10-18 DIAGNOSIS — M25569 Pain in unspecified knee: Secondary | ICD-10-CM | POA: Diagnosis not present

## 2014-10-18 DIAGNOSIS — I10 Essential (primary) hypertension: Secondary | ICD-10-CM | POA: Diagnosis not present

## 2014-10-24 DIAGNOSIS — Z96652 Presence of left artificial knee joint: Secondary | ICD-10-CM | POA: Diagnosis not present

## 2014-10-24 DIAGNOSIS — T84039A Mechanical loosening of unspecified internal prosthetic joint, initial encounter: Secondary | ICD-10-CM | POA: Diagnosis not present

## 2014-10-26 DIAGNOSIS — L570 Actinic keratosis: Secondary | ICD-10-CM | POA: Diagnosis not present

## 2014-11-16 DIAGNOSIS — Z96652 Presence of left artificial knee joint: Secondary | ICD-10-CM | POA: Diagnosis not present

## 2014-11-16 DIAGNOSIS — M25562 Pain in left knee: Secondary | ICD-10-CM | POA: Diagnosis not present

## 2014-12-05 ENCOUNTER — Other Ambulatory Visit: Payer: Self-pay | Admitting: Family Medicine

## 2014-12-05 ENCOUNTER — Other Ambulatory Visit: Payer: Self-pay | Admitting: Internal Medicine

## 2014-12-06 NOTE — Telephone Encounter (Signed)
Left detailed message informing patient that med has been denied, that she needs to be seen before further refills can be given.

## 2014-12-06 NOTE — Telephone Encounter (Signed)
Pt last seen 12/2013 and was due for 6 month follow up in 06/2014. Pt is almost 6 months past due and needs to be seen for further refills of Celexa. Mail order request denied. Please call pt to schedule f/u with Dr Etter Sjogren ASAP.

## 2015-01-03 DIAGNOSIS — T84033A Mechanical loosening of internal left knee prosthetic joint, initial encounter: Secondary | ICD-10-CM | POA: Diagnosis not present

## 2015-01-08 ENCOUNTER — Other Ambulatory Visit: Payer: Self-pay

## 2015-02-05 DIAGNOSIS — J320 Chronic maxillary sinusitis: Secondary | ICD-10-CM | POA: Diagnosis not present

## 2015-02-19 DIAGNOSIS — E119 Type 2 diabetes mellitus without complications: Secondary | ICD-10-CM | POA: Diagnosis not present

## 2015-02-19 DIAGNOSIS — I10 Essential (primary) hypertension: Secondary | ICD-10-CM | POA: Diagnosis not present

## 2015-02-28 DIAGNOSIS — T84033D Mechanical loosening of internal left knee prosthetic joint, subsequent encounter: Secondary | ICD-10-CM | POA: Diagnosis not present

## 2015-03-06 DIAGNOSIS — G4700 Insomnia, unspecified: Secondary | ICD-10-CM | POA: Diagnosis not present

## 2015-03-06 DIAGNOSIS — I10 Essential (primary) hypertension: Secondary | ICD-10-CM | POA: Diagnosis not present

## 2015-03-06 DIAGNOSIS — E119 Type 2 diabetes mellitus without complications: Secondary | ICD-10-CM | POA: Diagnosis not present

## 2015-03-06 DIAGNOSIS — E785 Hyperlipidemia, unspecified: Secondary | ICD-10-CM | POA: Diagnosis not present

## 2015-03-15 DIAGNOSIS — Z7901 Long term (current) use of anticoagulants: Secondary | ICD-10-CM | POA: Diagnosis not present

## 2015-03-15 DIAGNOSIS — Z79899 Other long term (current) drug therapy: Secondary | ICD-10-CM | POA: Diagnosis not present

## 2015-03-15 DIAGNOSIS — Y831 Surgical operation with implant of artificial internal device as the cause of abnormal reaction of the patient, or of later complication, without mention of misadventure at the time of the procedure: Secondary | ICD-10-CM | POA: Diagnosis not present

## 2015-03-15 DIAGNOSIS — T84033A Mechanical loosening of internal left knee prosthetic joint, initial encounter: Secondary | ICD-10-CM | POA: Diagnosis not present

## 2015-03-26 DIAGNOSIS — T84033A Mechanical loosening of internal left knee prosthetic joint, initial encounter: Secondary | ICD-10-CM | POA: Diagnosis not present

## 2015-03-26 DIAGNOSIS — Y831 Surgical operation with implant of artificial internal device as the cause of abnormal reaction of the patient, or of later complication, without mention of misadventure at the time of the procedure: Secondary | ICD-10-CM | POA: Diagnosis not present

## 2015-03-26 DIAGNOSIS — Z79899 Other long term (current) drug therapy: Secondary | ICD-10-CM | POA: Diagnosis not present

## 2015-03-26 DIAGNOSIS — T84033D Mechanical loosening of internal left knee prosthetic joint, subsequent encounter: Secondary | ICD-10-CM | POA: Diagnosis not present

## 2015-03-26 DIAGNOSIS — Z7901 Long term (current) use of anticoagulants: Secondary | ICD-10-CM | POA: Diagnosis not present

## 2015-03-26 DIAGNOSIS — Z96652 Presence of left artificial knee joint: Secondary | ICD-10-CM | POA: Diagnosis not present

## 2015-03-28 DIAGNOSIS — Z5189 Encounter for other specified aftercare: Secondary | ICD-10-CM | POA: Diagnosis not present

## 2015-03-28 DIAGNOSIS — R269 Unspecified abnormalities of gait and mobility: Secondary | ICD-10-CM | POA: Diagnosis not present

## 2015-03-30 DIAGNOSIS — Z5189 Encounter for other specified aftercare: Secondary | ICD-10-CM | POA: Diagnosis not present

## 2015-03-30 DIAGNOSIS — R269 Unspecified abnormalities of gait and mobility: Secondary | ICD-10-CM | POA: Diagnosis not present

## 2015-04-02 DIAGNOSIS — R269 Unspecified abnormalities of gait and mobility: Secondary | ICD-10-CM | POA: Diagnosis not present

## 2015-04-02 DIAGNOSIS — Z5189 Encounter for other specified aftercare: Secondary | ICD-10-CM | POA: Diagnosis not present

## 2015-04-04 DIAGNOSIS — R269 Unspecified abnormalities of gait and mobility: Secondary | ICD-10-CM | POA: Diagnosis not present

## 2015-04-04 DIAGNOSIS — Z5189 Encounter for other specified aftercare: Secondary | ICD-10-CM | POA: Diagnosis not present

## 2015-04-05 DIAGNOSIS — Z5189 Encounter for other specified aftercare: Secondary | ICD-10-CM | POA: Diagnosis not present

## 2015-04-05 DIAGNOSIS — R269 Unspecified abnormalities of gait and mobility: Secondary | ICD-10-CM | POA: Diagnosis not present

## 2015-04-09 DIAGNOSIS — Z5189 Encounter for other specified aftercare: Secondary | ICD-10-CM | POA: Diagnosis not present

## 2015-04-09 DIAGNOSIS — R269 Unspecified abnormalities of gait and mobility: Secondary | ICD-10-CM | POA: Diagnosis not present

## 2015-04-11 DIAGNOSIS — Z5189 Encounter for other specified aftercare: Secondary | ICD-10-CM | POA: Diagnosis not present

## 2015-04-11 DIAGNOSIS — R269 Unspecified abnormalities of gait and mobility: Secondary | ICD-10-CM | POA: Diagnosis not present

## 2015-04-13 DIAGNOSIS — R269 Unspecified abnormalities of gait and mobility: Secondary | ICD-10-CM | POA: Diagnosis not present

## 2015-04-13 DIAGNOSIS — Z5189 Encounter for other specified aftercare: Secondary | ICD-10-CM | POA: Diagnosis not present

## 2015-04-16 DIAGNOSIS — R269 Unspecified abnormalities of gait and mobility: Secondary | ICD-10-CM | POA: Diagnosis not present

## 2015-04-16 DIAGNOSIS — Z5189 Encounter for other specified aftercare: Secondary | ICD-10-CM | POA: Diagnosis not present

## 2015-04-18 DIAGNOSIS — R269 Unspecified abnormalities of gait and mobility: Secondary | ICD-10-CM | POA: Diagnosis not present

## 2015-04-18 DIAGNOSIS — Z5189 Encounter for other specified aftercare: Secondary | ICD-10-CM | POA: Diagnosis not present

## 2015-04-20 DIAGNOSIS — Z5189 Encounter for other specified aftercare: Secondary | ICD-10-CM | POA: Diagnosis not present

## 2015-04-20 DIAGNOSIS — R269 Unspecified abnormalities of gait and mobility: Secondary | ICD-10-CM | POA: Diagnosis not present

## 2015-04-25 DIAGNOSIS — Z5189 Encounter for other specified aftercare: Secondary | ICD-10-CM | POA: Diagnosis not present

## 2015-04-25 DIAGNOSIS — R269 Unspecified abnormalities of gait and mobility: Secondary | ICD-10-CM | POA: Diagnosis not present

## 2015-04-27 DIAGNOSIS — Z5189 Encounter for other specified aftercare: Secondary | ICD-10-CM | POA: Diagnosis not present

## 2015-04-27 DIAGNOSIS — R269 Unspecified abnormalities of gait and mobility: Secondary | ICD-10-CM | POA: Diagnosis not present

## 2015-05-08 DIAGNOSIS — T84033D Mechanical loosening of internal left knee prosthetic joint, subsequent encounter: Secondary | ICD-10-CM | POA: Diagnosis not present

## 2015-05-08 DIAGNOSIS — Z96652 Presence of left artificial knee joint: Secondary | ICD-10-CM | POA: Diagnosis not present

## 2015-06-06 DIAGNOSIS — I10 Essential (primary) hypertension: Secondary | ICD-10-CM | POA: Diagnosis not present

## 2015-06-06 DIAGNOSIS — E119 Type 2 diabetes mellitus without complications: Secondary | ICD-10-CM | POA: Diagnosis not present

## 2015-06-06 DIAGNOSIS — E785 Hyperlipidemia, unspecified: Secondary | ICD-10-CM | POA: Diagnosis not present

## 2015-06-25 DIAGNOSIS — E119 Type 2 diabetes mellitus without complications: Secondary | ICD-10-CM | POA: Diagnosis not present

## 2015-06-25 DIAGNOSIS — Z1159 Encounter for screening for other viral diseases: Secondary | ICD-10-CM | POA: Diagnosis not present

## 2015-06-25 DIAGNOSIS — Z1239 Encounter for other screening for malignant neoplasm of breast: Secondary | ICD-10-CM | POA: Diagnosis not present

## 2015-06-25 DIAGNOSIS — Z23 Encounter for immunization: Secondary | ICD-10-CM | POA: Diagnosis not present

## 2015-06-25 DIAGNOSIS — G2581 Restless legs syndrome: Secondary | ICD-10-CM | POA: Diagnosis not present

## 2015-06-25 DIAGNOSIS — G4700 Insomnia, unspecified: Secondary | ICD-10-CM | POA: Diagnosis not present

## 2015-06-25 DIAGNOSIS — I10 Essential (primary) hypertension: Secondary | ICD-10-CM | POA: Diagnosis not present

## 2015-06-25 DIAGNOSIS — E785 Hyperlipidemia, unspecified: Secondary | ICD-10-CM | POA: Diagnosis not present

## 2015-07-17 DIAGNOSIS — T84033D Mechanical loosening of internal left knee prosthetic joint, subsequent encounter: Secondary | ICD-10-CM | POA: Diagnosis not present

## 2015-07-17 DIAGNOSIS — Z96652 Presence of left artificial knee joint: Secondary | ICD-10-CM | POA: Diagnosis not present

## 2015-08-06 DIAGNOSIS — Z1231 Encounter for screening mammogram for malignant neoplasm of breast: Secondary | ICD-10-CM | POA: Diagnosis not present

## 2015-10-01 ENCOUNTER — Encounter: Payer: Self-pay | Admitting: Gastroenterology

## 2015-11-16 ENCOUNTER — Encounter: Payer: Self-pay | Admitting: Family Medicine

## 2015-11-16 ENCOUNTER — Ambulatory Visit (INDEPENDENT_AMBULATORY_CARE_PROVIDER_SITE_OTHER): Payer: Medicare Other | Admitting: Family Medicine

## 2015-11-16 VITALS — BP 110/68 | HR 63 | Temp 98.3°F | Ht 61.5 in | Wt 214.2 lb

## 2015-11-16 DIAGNOSIS — F32A Depression, unspecified: Secondary | ICD-10-CM

## 2015-11-16 DIAGNOSIS — I10 Essential (primary) hypertension: Secondary | ICD-10-CM

## 2015-11-16 DIAGNOSIS — IMO0002 Reserved for concepts with insufficient information to code with codable children: Secondary | ICD-10-CM

## 2015-11-16 DIAGNOSIS — K219 Gastro-esophageal reflux disease without esophagitis: Secondary | ICD-10-CM | POA: Diagnosis not present

## 2015-11-16 DIAGNOSIS — E1151 Type 2 diabetes mellitus with diabetic peripheral angiopathy without gangrene: Secondary | ICD-10-CM | POA: Diagnosis not present

## 2015-11-16 DIAGNOSIS — E1165 Type 2 diabetes mellitus with hyperglycemia: Secondary | ICD-10-CM

## 2015-11-16 DIAGNOSIS — E785 Hyperlipidemia, unspecified: Secondary | ICD-10-CM | POA: Diagnosis not present

## 2015-11-16 DIAGNOSIS — F329 Major depressive disorder, single episode, unspecified: Secondary | ICD-10-CM

## 2015-11-16 LAB — HEMOGLOBIN A1C: HEMOGLOBIN A1C: 8.1 % — AB (ref 4.6–6.5)

## 2015-11-16 LAB — POCT URINALYSIS DIPSTICK
BILIRUBIN UA: NEGATIVE
Blood, UA: NEGATIVE
Glucose, UA: NEGATIVE
KETONES UA: NEGATIVE
Leukocytes, UA: NEGATIVE
NITRITE UA: NEGATIVE
PH UA: 5.5
Protein, UA: NEGATIVE
Spec Grav, UA: >=1.03
Urobilinogen, UA: 0.2

## 2015-11-16 LAB — COMPREHENSIVE METABOLIC PANEL
ALBUMIN: 4 g/dL (ref 3.5–5.2)
ALK PHOS: 90 U/L (ref 39–117)
ALT: 12 U/L (ref 0–35)
AST: 14 U/L (ref 0–37)
BILIRUBIN TOTAL: 0.4 mg/dL (ref 0.2–1.2)
BUN: 23 mg/dL (ref 6–23)
CALCIUM: 9.3 mg/dL (ref 8.4–10.5)
CO2: 25 mEq/L (ref 19–32)
Chloride: 104 mEq/L (ref 96–112)
Creatinine, Ser: 1.34 mg/dL — ABNORMAL HIGH (ref 0.40–1.20)
GFR: 41.78 mL/min — AB (ref >60.00)
GLUCOSE: 157 mg/dL — AB (ref 70–99)
Potassium: 4.7 mEq/L (ref 3.5–5.1)
Sodium: 137 mEq/L (ref 135–145)
TOTAL PROTEIN: 6.6 g/dL (ref 6.0–8.3)

## 2015-11-16 LAB — LIPID PANEL
CHOL/HDL RATIO: 5
Cholesterol: 177 mg/dL (ref 0–200)
HDL: 38.1 mg/dL — ABNORMAL LOW (ref >39.00)
LDL Cholesterol: 105 mg/dL — ABNORMAL HIGH (ref 0–99)
NonHDL: 139
TRIGLYCERIDES: 172 mg/dL — AB (ref 0.0–149.0)
VLDL: 34.4 mg/dL (ref 0.0–40.0)

## 2015-11-16 LAB — MICROALBUMIN / CREATININE URINE RATIO
CREATININE, U: 123.8 mg/dL
MICROALB/CREAT RATIO: 0.6 mg/g (ref 0.0–30.0)
Microalb, Ur: 0.7 mg/dL (ref 0.0–1.9)

## 2015-11-16 MED ORDER — CITALOPRAM HYDROBROMIDE 40 MG PO TABS: ORAL_TABLET | ORAL | Status: DC

## 2015-11-16 MED ORDER — ESOMEPRAZOLE MAGNESIUM 40 MG PO CPDR: 40.0000 mg | DELAYED_RELEASE_CAPSULE | Freq: Every day | ORAL | Status: DC

## 2015-11-16 MED ORDER — LISINOPRIL-HYDROCHLOROTHIAZIDE 10-12.5 MG PO TABS: ORAL_TABLET | ORAL | Status: DC

## 2015-11-16 MED ORDER — METFORMIN HCL ER 500 MG PO TB24: ORAL_TABLET | ORAL | Status: DC

## 2015-11-16 NOTE — Progress Notes (Signed)
Pre visit review using our clinic review tool, if applicable. No additional management support is needed unless otherwise documented below in the visit note. 

## 2015-11-16 NOTE — Progress Notes (Signed)
Patient ID: Bethany Miller, female    DOB: 10/30/1945  Age: 70 y.o. MRN: [id number]    Subjective:  Subjective HPI Bethany Miller presents for dm and cholesterol f/u.  Pt is reestablishing.     DIABETES  Blood Sugar ranges-not checking  Polyuria- no New Visual problems- no Hypoglycemic symptoms- no Other side effects-no Medication compliance - good Last eye exam- due Foot exam- today  HYPERLIPIDEMIA  Medication compliance- good RUQ pain- no  Muscle aches- no Other side effects-no  Review of Systems  Constitutional: Negative for diaphoresis, appetite change, fatigue and unexpected weight change.  Eyes: Negative for pain, redness and visual disturbance.  Respiratory: Negative for cough, chest tightness, shortness of breath and wheezing.   Cardiovascular: Negative for chest pain, palpitations and leg swelling.  Endocrine: Negative for cold intolerance, heat intolerance, polydipsia, polyphagia and polyuria.  Genitourinary: Negative for dysuria, frequency and difficulty urinating.  Neurological: Negative for dizziness, light-headedness, numbness and headaches.    History Past Medical History  Diagnosis Date  . Murmur   . Diabetes mellitus   . Hypertension   . Hyperlipidemia     She has past surgical history that includes Total knee arthroplasty.   Her family history includes Alcohol abuse in her father; Arthritis in her mother and sister; Cancer in her brother and brother; Cancer (age of onset: 8) in her brother; Cancer (age of onset: 79) in her brother; Cirrhosis in her father; Heart disease in her brother and brother; Hyperlipidemia in her brother and brother; Hypertension in her brother and sister; Throat cancer in her brother.She reports that she has never smoked. She has never used smokeless tobacco. She reports that she drinks alcohol. She reports that she does not use illicit drugs.  Current Outpatient Prescriptions on File Prior to Visit  Medication Sig Dispense Refill   . FreeStyle Unistick II Lancets MISC by Does not apply route.      Marland Kitchen glucose blood (FREESTYLE INSULINX TEST) test strip Check blood sugar once a day 300 each 3   No current facility-administered medications on file prior to visit.     Objective:  Objective Physical Exam  Constitutional: She is oriented to person, place, and time. She appears well-developed and well-nourished.  HENT:  Head: Normocephalic and atraumatic.  Eyes: Conjunctivae and EOM are normal.  Neck: Normal range of motion. Neck supple. No JVD present. Carotid bruit is not present. No thyromegaly present.  Cardiovascular: Normal rate and regular rhythm.   Murmur heard. Pulmonary/Chest: Effort normal and breath sounds normal. No respiratory distress. She has no wheezes. She has no rales. She exhibits no tenderness.  Musculoskeletal: She exhibits no edema.  Neurological: She is alert and oriented to person, place, and time.  Psychiatric: She has a normal mood and affect. Her behavior is normal.  Nursing note and vitals reviewed. Sensory exam of the foot--  Unable to feel R big toe, tested with the monofilament. Good pulses, no lesions or ulcers, good peripheral pulses.  BP 110/68 mmHg  Pulse 63  Temp(Src) 98.3 F (36.8 C) (Oral)  Ht 5' 1.5" (1.562 m)  Wt 214 lb 3.2 oz (97.16 kg)  BMI 39.82 kg/m2  SpO2 96% Wt Readings from Last 3 Encounters:  11/16/15 214 lb 3.2 oz (97.16 kg)  12/23/13 225 lb (102.059 kg)  09/26/13 231 lb (104.781 kg)     Lab Results  Component Value Date   WBC 6.3 01/26/2013   HGB 12.7 01/26/2013   HCT 37.3 01/26/2013   PLT 209.0 01/26/2013  GLUCOSE 157* 11/16/2015   CHOL 177 11/16/2015   TRIG 172.0* 11/16/2015   HDL 38.10* 11/16/2015   LDLDIRECT 149.6 01/26/2013   LDLCALC 105* 11/16/2015   ALT 12 11/16/2015   AST 14 11/16/2015   NA 137 11/16/2015   K 4.7 11/16/2015   CL 104 11/16/2015   CREATININE 1.34* 11/16/2015   BUN 23 11/16/2015   CO2 25 11/16/2015   TSH 1.56 03/18/2011     HGBA1C 8.1* 11/16/2015   MICROALBUR 0.7 11/16/2015    No results found.   Assessment & Plan:  Plan I have discontinued Ms. Swallows's LORazepam, fluticasone, glipiZIDE, ciprofloxacin, and atorvastatin. I have also changed her lisinopril-hydrochlorothiazide and esomeprazole. Additionally, I am having her maintain her FREESTYLE UNISTICK II LANCETS, glucose blood, Melatonin, Multiple Vitamins-Minerals (MULTIVITAMIN ADULT PO), Glucosamine-Chondroitin (MOVE FREE PO), metFORMIN, and citalopram.  Meds ordered this encounter  Medications  . DISCONTD: esomeprazole (NEXIUM) 40 MG capsule    Sig: Take 40 mg by mouth daily at 12 noon.  . Melatonin 5 MG TABS    Sig: Take 1 tablet by mouth daily.  . Multiple Vitamins-Minerals (MULTIVITAMIN ADULT PO)    Sig: Take by mouth. Diabetes Daily Pack  . Glucosamine-Chondroitin (MOVE FREE PO)    Sig: Take by mouth.  . metFORMIN (GLUCOPHAGE XR) 500 MG 24 hr tablet    Sig: Take 2 tablet (1000mg ) in the morning and 2 tablet (1000mg ) at dinner.    Dispense:  360 tablet    Refill:  0  . lisinopril-hydrochlorothiazide (PRINZIDE,ZESTORETIC) 10-12.5 MG tablet    Sig: TAKE 1 TABLET BY MOUTH EVERY DAY    Dispense:  90 tablet    Refill:  1  . citalopram (CELEXA) 40 MG tablet    Sig: TAKE 1 TABLET (40 MG TOTAL) BY MOUTH DAILY.    Dispense:  90 tablet    Refill:  1  . esomeprazole (NEXIUM) 40 MG capsule    Sig: Take 1 capsule (40 mg total) by mouth daily at 12 noon.    Problem List Items Addressed This Visit    None    Visit Diagnoses    DM (diabetes mellitus) type II uncontrolled, periph vascular disorder ([place])    -  Primary    Relevant Medications    metFORMIN (GLUCOPHAGE XR) 500 MG 24 hr tablet    lisinopril-hydrochlorothiazide (PRINZIDE,ZESTORETIC) 10-12.5 MG tablet    Other Relevant Orders    Comprehensive metabolic panel (Completed)    Hemoglobin A1c (Completed)    POCT urinalysis dipstick (Completed)    Hyperlipidemia LDL goal <100        Relevant  Medications    lisinopril-hydrochlorothiazide (PRINZIDE,ZESTORETIC) 10-12.5 MG tablet    Other Relevant Orders    Lipid panel (Completed)    Microalbumin / creatinine urine ratio (Completed)    POCT urinalysis dipstick (Completed)    Gastroesophageal reflux disease, esophagitis presence not specified        Relevant Medications    esomeprazole (NEXIUM) 40 MG capsule    Essential hypertension        Relevant Medications    lisinopril-hydrochlorothiazide (PRINZIDE,ZESTORETIC) 10-12.5 MG tablet    Depression        Relevant Medications    citalopram (CELEXA) 40 MG tablet       Follow-up: Return in about 3 months (around 02/16/2016), or if symptoms worsen or fail to improve, for hyperlipidemia, diabetes II.  Ann Held, DO

## 2015-11-16 NOTE — Patient Instructions (Signed)

## 2015-11-21 ENCOUNTER — Other Ambulatory Visit: Payer: Self-pay

## 2015-11-21 DIAGNOSIS — F329 Major depressive disorder, single episode, unspecified: Secondary | ICD-10-CM

## 2015-11-21 DIAGNOSIS — F32A Depression, unspecified: Secondary | ICD-10-CM

## 2015-12-07 ENCOUNTER — Telehealth: Payer: Self-pay | Admitting: Family Medicine

## 2015-12-07 MED ORDER — SITAGLIP PHOS-METFORMIN HCL ER 50-1000 MG PO TB24: 2.0000 | ORAL_TABLET | Freq: Every day | ORAL | Status: DC

## 2015-12-07 MED ORDER — FENOFIBRATE 160 MG PO TABS: 160.0000 mg | ORAL_TABLET | Freq: Every day | ORAL | Status: DC

## 2015-12-07 NOTE — Telephone Encounter (Signed)
Spoke with patient and I made her aware that since her labs were viewed on my-chart on 11/19/15 and I sent her a message that she would respond when she was avail. She said she did not know. She said she is taking Lipitor 40 mg. I made her aware it was not on her medication list and since it wasn't on there, we were not aware that she was taking it, I made the patient aware, that I was out of the office and unaware that she had not viewed her My-chart message from me in regards to her labs, she verbalized understanding and got upset and said this (my-chart) is not the only way of communication and I advised this is what was discussed and I made her aware she agreed to receiving messages, I also made her aware in the event that the patient's did not read the results in a certain amount of time, then I would call but I just got back, she verbalized understanding. She wanted to know what she should do about the cholesterol med's     KP

## 2015-12-07 NOTE — Telephone Encounter (Signed)
She is on fenofibrate too? If no-add fenofibrate 160 mg 1 po qd #30 2 refill Recheck 3 months To get TG down

## 2015-12-07 NOTE — Telephone Encounter (Signed)
-----   Message from Oneta Rack sent at 12/07/2015  1:42 PM EDT ----- Regarding: lab results  Contact: [phone] Patient states no one has contacted her since her last OV 02/16/16 regarding lab results and states this is poor customer service. Informed patient you are in clinic and will follow up.

## 2015-12-07 NOTE — Telephone Encounter (Signed)
Caller name: Self  Can be reached: [phone]    Reason for call: Patient request call back about lab results

## 2015-12-07 NOTE — Telephone Encounter (Signed)
Spoke with patient and she verbalized understanding, Rx has been faxed.     KP

## 2015-12-31 ENCOUNTER — Other Ambulatory Visit: Payer: Self-pay

## 2015-12-31 MED ORDER — CIPROFLOXACIN HCL 500 MG PO TABS: 500.0000 mg | ORAL_TABLET | Freq: Two times a day (BID) | ORAL | Status: DC

## 2016-02-22 ENCOUNTER — Ambulatory Visit: Payer: BLUE CROSS/BLUE SHIELD | Admitting: Family Medicine

## 2016-03-13 DIAGNOSIS — M25561 Pain in right knee: Secondary | ICD-10-CM | POA: Diagnosis not present

## 2016-03-13 DIAGNOSIS — Z6838 Body mass index (BMI) 38.0-38.9, adult: Secondary | ICD-10-CM | POA: Diagnosis not present

## 2016-03-13 DIAGNOSIS — M1711 Unilateral primary osteoarthritis, right knee: Secondary | ICD-10-CM | POA: Diagnosis not present

## 2016-03-13 DIAGNOSIS — M25562 Pain in left knee: Secondary | ICD-10-CM | POA: Diagnosis not present

## 2016-03-13 DIAGNOSIS — Z96652 Presence of left artificial knee joint: Secondary | ICD-10-CM | POA: Diagnosis not present

## 2016-03-28 ENCOUNTER — Other Ambulatory Visit: Payer: Self-pay | Admitting: Family Medicine

## 2016-03-28 ENCOUNTER — Ambulatory Visit (INDEPENDENT_AMBULATORY_CARE_PROVIDER_SITE_OTHER): Payer: Medicare Other | Admitting: Family Medicine

## 2016-03-28 ENCOUNTER — Encounter: Payer: Self-pay | Admitting: Family Medicine

## 2016-03-28 VITALS — BP 112/60 | HR 67 | Temp 98.0°F | Resp 17 | Ht 62.0 in | Wt 221.2 lb

## 2016-03-28 DIAGNOSIS — IMO0002 Reserved for concepts with insufficient information to code with codable children: Secondary | ICD-10-CM

## 2016-03-28 DIAGNOSIS — I10 Essential (primary) hypertension: Secondary | ICD-10-CM

## 2016-03-28 DIAGNOSIS — E1165 Type 2 diabetes mellitus with hyperglycemia: Secondary | ICD-10-CM | POA: Diagnosis not present

## 2016-03-28 DIAGNOSIS — E785 Hyperlipidemia, unspecified: Secondary | ICD-10-CM | POA: Diagnosis not present

## 2016-03-28 DIAGNOSIS — Z23 Encounter for immunization: Secondary | ICD-10-CM | POA: Diagnosis not present

## 2016-03-28 DIAGNOSIS — E1151 Type 2 diabetes mellitus with diabetic peripheral angiopathy without gangrene: Secondary | ICD-10-CM | POA: Diagnosis not present

## 2016-03-28 LAB — POCT URINALYSIS DIPSTICK
BILIRUBIN UA: NEGATIVE
GLUCOSE UA: NEGATIVE
KETONES UA: NEGATIVE
Leukocytes, UA: NEGATIVE
NITRITE UA: NEGATIVE
PH UA: 6
Protein, UA: NEGATIVE
RBC UA: NEGATIVE
Urobilinogen, UA: 0.2

## 2016-03-28 MED ORDER — ATORVASTATIN CALCIUM 40 MG PO TABS: 40.0000 mg | ORAL_TABLET | Freq: Every day | ORAL | 1 refills | Status: DC

## 2016-03-28 MED ORDER — LISINOPRIL-HYDROCHLOROTHIAZIDE 10-12.5 MG PO TABS: ORAL_TABLET | ORAL | 1 refills | Status: DC

## 2016-03-28 MED ORDER — LISINOPRIL-HYDROCHLOROTHIAZIDE 10-12.5 MG PO TABS
ORAL_TABLET | ORAL | 1 refills | Status: DC
Start: 2016-03-28 — End: 2016-04-07

## 2016-03-28 NOTE — Progress Notes (Signed)
Patient ID: Bethany Miller, female    DOB: Aug 31, 1945  Age: 70 y.o. MRN: [id number]    Subjective:  Subjective  HPI Bethany Miller presents for dm, cholesterol and bp.  HPI HYPERTENSION   Blood pressure range-not checking   Chest pain- no      Dyspnea- no Lightheadedness- no   Edema- no  Other side effects - no   Medication compliance: good Low salt diet- no    DIABETES    Blood Sugar ranges-120 and greater  Polyuria- no New Visual problems- no  Hypoglycemic symptoms- no  Other side effects-no Medication compliance - god Last eye exam- 02/2016 Foot exam- today   HYPERLIPIDEMIA  Medication compliance- good RUQ pain- no  Muscle aches- no Other side effects-no   Review of Systems  Constitutional: Negative for activity change, appetite change, diaphoresis, fatigue and unexpected weight change.  Eyes: Negative for pain, redness and visual disturbance.  Respiratory: Negative for cough, chest tightness, shortness of breath and wheezing.   Cardiovascular: Negative for chest pain, palpitations and leg swelling.  Endocrine: Negative for cold intolerance, heat intolerance, polydipsia, polyphagia and polyuria.  Genitourinary: Negative for difficulty urinating, dysuria and frequency.  Neurological: Negative for dizziness, light-headedness, numbness and headaches.  Psychiatric/Behavioral: Negative for behavioral problems and dysphoric mood. The patient is not nervous/anxious.     History Past Medical History:  Diagnosis Date  . Diabetes mellitus   . Hyperlipidemia   . Hypertension   . Murmur     She has a past surgical history that includes Total knee arthroplasty.   Her family history includes Alcohol abuse in her father; Arthritis in her mother and sister; Cancer in her brother and brother; Cancer (age of onset: 87) in her brother; Cancer (age of onset: 51) in her brother; Cirrhosis in her father; Heart disease in her brother and brother; Hyperlipidemia in her brother and  brother; Hypertension in her brother and sister; Throat cancer in her brother.She reports that she has never smoked. She has never used smokeless tobacco. She reports that she drinks alcohol. She reports that she does not use drugs.  Current Outpatient Prescriptions on File Prior to Visit  Medication Sig Dispense Refill  . citalopram (CELEXA) 40 MG tablet TAKE 1 TABLET (40 MG TOTAL) BY MOUTH DAILY. 90 tablet 1  . esomeprazole (NEXIUM) 40 MG capsule Take 1 capsule (40 mg total) by mouth daily at 12 noon.    . fenofibrate 160 MG tablet Take 1 tablet (160 mg total) by mouth daily. 30 tablet 2  . FreeStyle Unistick II Lancets MISC by Does not apply route.      . Glucosamine-Chondroitin (MOVE FREE PO) Take by mouth.    Marland Kitchen glucose blood (FREESTYLE INSULINX TEST) test strip Check blood sugar once a day 300 each 3  . Melatonin 5 MG TABS Take 1 tablet by mouth daily.    . Multiple Vitamins-Minerals (MULTIVITAMIN ADULT PO) Take by mouth. Diabetes Daily Pack    . SitaGLIPtin-MetFORMIN HCl 50-1000 MG TB24 Take 2 tablets by mouth daily. 60 tablet 2   No current facility-administered medications on file prior to visit.      Objective:  Objective  Physical Exam  Constitutional: She is oriented to person, place, and time. She appears well-developed and well-nourished.  HENT:  Head: Normocephalic and atraumatic.  Eyes: Conjunctivae and EOM are normal.  Neck: Normal range of motion. Neck supple. No JVD present. Carotid bruit is not present. No thyromegaly present.  Cardiovascular: Normal rate, regular rhythm and normal  heart sounds.   No murmur heard. Pulmonary/Chest: Effort normal and breath sounds normal. No respiratory distress. She has no wheezes. She has no rales. She exhibits no tenderness.  Musculoskeletal: She exhibits no edema.  Neurological: She is alert and oriented to person, place, and time.  Psychiatric: She has a normal mood and affect. Her behavior is normal. Judgment and thought content  normal.  Nursing note and vitals reviewed. Sensory exam of the foot is normal, tested with the monofilament. Good pulses, no lesions or ulcers, good peripheral pulses.  BP 112/60 (BP Location: Left Arm, Patient Position: Sitting, Cuff Size: Large)   Pulse 67   Temp 98 F (36.7 C) (Oral)   Resp 17   Ht 5\' 2"  (1.575 m)   Wt 221 lb 3.2 oz (100.3 kg)   SpO2 97%   BMI 40.46 kg/m  Wt Readings from Last 3 Encounters:  03/28/16 221 lb 3.2 oz (100.3 kg)  11/16/15 214 lb 3.2 oz (97.2 kg)  12/23/13 225 lb (102.1 kg)     Lab Results  Component Value Date   WBC 6.3 01/26/2013   HGB 12.7 01/26/2013   HCT 37.3 01/26/2013   PLT 209.0 01/26/2013   GLUCOSE 157 (H) 11/16/2015   CHOL 177 11/16/2015   TRIG 172.0 (H) 11/16/2015   HDL 38.10 (L) 11/16/2015   LDLDIRECT 149.6 01/26/2013   LDLCALC 105 (H) 11/16/2015   ALT 12 11/16/2015   AST 14 11/16/2015   NA 137 11/16/2015   K 4.7 11/16/2015   CL 104 11/16/2015   CREATININE 1.34 (H) 11/16/2015   BUN 23 11/16/2015   CO2 25 11/16/2015   TSH 1.56 03/18/2011   HGBA1C 8.1 (H) 11/16/2015   MICROALBUR 0.7 11/16/2015    No results found.   Assessment & Plan:  Plan  I have discontinued Bethany Miller's ciprofloxacin. I am also having her maintain her FREESTYLE UNISTICK II LANCETS, glucose blood, Melatonin, Multiple Vitamins-Minerals (MULTIVITAMIN ADULT PO), Glucosamine-Chondroitin (MOVE FREE PO), citalopram, esomeprazole, fenofibrate, SitaGLIPtin-MetFORMIN HCl, lisinopril-hydrochlorothiazide, and atorvastatin.  Meds ordered this encounter  Medications  . DISCONTD: lisinopril-hydrochlorothiazide (PRINZIDE,ZESTORETIC) 10-12.5 MG tablet    Sig: TAKE 1 TABLET BY MOUTH EVERY DAY    Dispense:  90 tablet    Refill:  1  . DISCONTD: atorvastatin (LIPITOR) 40 MG tablet    Sig: Take 1 tablet (40 mg total) by mouth daily.    Dispense:  90 tablet    Refill:  1  . lisinopril-hydrochlorothiazide (PRINZIDE,ZESTORETIC) 10-12.5 MG tablet    Sig: TAKE 1 TABLET  BY MOUTH EVERY DAY    Dispense:  90 tablet    Refill:  1  . atorvastatin (LIPITOR) 40 MG tablet    Sig: Take 1 tablet (40 mg total) by mouth daily.    Dispense:  90 tablet    Refill:  1    Problem List Items Addressed This Visit    None    Visit Diagnoses    DM (diabetes mellitus) type II uncontrolled, periph vascular disorder ([place])    -  Primary   Relevant Medications   lisinopril-hydrochlorothiazide (PRINZIDE,ZESTORETIC) 10-12.5 MG tablet   atorvastatin (LIPITOR) 40 MG tablet   Other Relevant Orders   Comprehensive metabolic panel   Hemoglobin A1c   CBC with Differential/Platelet   POCT urinalysis dipstick   Microalbumin / creatinine urine ratio   Encounter for immunization       Relevant Orders   Flu vaccine HIGH DOSE PF (Completed)   POCT urinalysis dipstick   Microalbumin /  creatinine urine ratio   Hyperlipidemia LDL goal <70       Relevant Medications   lisinopril-hydrochlorothiazide (PRINZIDE,ZESTORETIC) 10-12.5 MG tablet   atorvastatin (LIPITOR) 40 MG tablet   Other Relevant Orders   Comprehensive metabolic panel   Lipid panel   CBC with Differential/Platelet   POCT urinalysis dipstick   Microalbumin / creatinine urine ratio   Essential hypertension       Relevant Medications   lisinopril-hydrochlorothiazide (PRINZIDE,ZESTORETIC) 10-12.5 MG tablet   atorvastatin (LIPITOR) 40 MG tablet   Other Relevant Orders   Comprehensive metabolic panel   CBC with Differential/Platelet   POCT urinalysis dipstick   Microalbumin / creatinine urine ratio      Follow-up: Return in about 6 months (around 09/25/2016) for hypertension, hyperlipidemia, diabetes II.  Ann Held, DO

## 2016-03-28 NOTE — Patient Instructions (Signed)

## 2016-03-28 NOTE — Progress Notes (Signed)
Pre visit review using our clinic review tool, if applicable. No additional management support is needed unless otherwise documented below in the visit note. 

## 2016-03-29 LAB — COMPREHENSIVE METABOLIC PANEL
ALBUMIN: 4.1 g/dL (ref 3.6–5.1)
ALT: 12 U/L (ref 6–29)
AST: 13 U/L (ref 10–35)
Alkaline Phosphatase: 66 U/L (ref 33–130)
BILIRUBIN TOTAL: 0.3 mg/dL (ref 0.2–1.2)
BUN: 21 mg/dL (ref 7–25)
CO2: 26 mmol/L (ref 20–31)
CREATININE: 1.34 mg/dL — AB (ref 0.50–0.99)
Calcium: 9.2 mg/dL (ref 8.6–10.4)
Chloride: 104 mmol/L (ref 98–110)
Glucose, Bld: 129 mg/dL — ABNORMAL HIGH (ref 65–99)
Potassium: 4.9 mmol/L (ref 3.5–5.3)
SODIUM: 138 mmol/L (ref 135–146)
TOTAL PROTEIN: 6.5 g/dL (ref 6.1–8.1)

## 2016-03-29 LAB — LIPID PANEL
Cholesterol: 233 mg/dL — ABNORMAL HIGH (ref 125–200)
HDL: 52 mg/dL (ref >=46)
LDL CALC: 146 mg/dL — AB (ref <130)
Total CHOL/HDL Ratio: 4.5 Ratio (ref <=5.0)
Triglycerides: 173 mg/dL — ABNORMAL HIGH (ref <150)
VLDL: 35 mg/dL — ABNORMAL HIGH (ref <30)

## 2016-03-29 LAB — CBC WITH DIFFERENTIAL/PLATELET
Basophils Absolute: 0 cells/uL (ref 0–200)
Basophils Relative: 0 %
EOS PCT: 2 %
Eosinophils Absolute: 142 cells/uL (ref 15–500)
HCT: 36.5 % (ref 35.0–45.0)
HEMOGLOBIN: 12 g/dL (ref 11.7–15.5)
LYMPHS ABS: 1988 {cells}/uL (ref 850–3900)
Lymphocytes Relative: 28 %
MCH: 29.3 pg (ref 27.0–33.0)
MCHC: 32.9 g/dL (ref 32.0–36.0)
MCV: 89 fL (ref 80.0–100.0)
MONOS PCT: 6 %
MPV: 9.3 fL (ref 7.5–12.5)
Monocytes Absolute: 426 cells/uL (ref 200–950)
NEUTROS ABS: 4544 {cells}/uL (ref 1500–7800)
Neutrophils Relative %: 64 %
PLATELETS: 260 10*3/uL (ref 140–400)
RBC: 4.1 MIL/uL (ref 3.80–5.10)
RDW: 13.6 % (ref 11.0–15.0)
WBC: 7.1 10*3/uL (ref 3.8–10.8)

## 2016-03-29 LAB — HEMOGLOBIN A1C
HEMOGLOBIN A1C: 8.8 % — AB (ref <5.7)
MEAN PLASMA GLUCOSE: 206 mg/dL

## 2016-03-29 LAB — MICROALBUMIN / CREATININE URINE RATIO
Creatinine, Urine: 135 mg/dL (ref 20–320)
MICROALB/CREAT RATIO: 4 ug/mg{creat} (ref <30)
Microalb, Ur: 0.6 mg/dL

## 2016-04-07 ENCOUNTER — Other Ambulatory Visit: Payer: Self-pay

## 2016-04-07 DIAGNOSIS — I10 Essential (primary) hypertension: Secondary | ICD-10-CM

## 2016-04-07 MED ORDER — LISINOPRIL-HYDROCHLOROTHIAZIDE 10-12.5 MG PO TABS: ORAL_TABLET | ORAL | 1 refills | Status: DC

## 2016-04-07 MED ORDER — EZETIMIBE 10 MG PO TABS: 10.0000 mg | ORAL_TABLET | Freq: Every day | ORAL | 1 refills | Status: DC

## 2016-04-07 MED ORDER — EMPAGLIFLOZIN 10 MG PO TABS: 10.0000 mg | ORAL_TABLET | Freq: Every day | ORAL | 1 refills | Status: DC

## 2016-05-14 DIAGNOSIS — Z96652 Presence of left artificial knee joint: Secondary | ICD-10-CM | POA: Diagnosis not present

## 2016-05-14 DIAGNOSIS — M1711 Unilateral primary osteoarthritis, right knee: Secondary | ICD-10-CM | POA: Diagnosis not present

## 2016-05-14 DIAGNOSIS — S8991XA Unspecified injury of right lower leg, initial encounter: Secondary | ICD-10-CM | POA: Diagnosis not present

## 2016-07-03 ENCOUNTER — Ambulatory Visit (INDEPENDENT_AMBULATORY_CARE_PROVIDER_SITE_OTHER): Payer: Medicare Other | Admitting: Internal Medicine

## 2016-07-03 ENCOUNTER — Encounter: Payer: Self-pay | Admitting: Internal Medicine

## 2016-07-03 VITALS — BP 140/60 | HR 63 | Temp 97.5°F | Ht 62.0 in | Wt 211.0 lb

## 2016-07-03 DIAGNOSIS — E1121 Type 2 diabetes mellitus with diabetic nephropathy: Secondary | ICD-10-CM

## 2016-07-03 LAB — POCT GLYCOSYLATED HEMOGLOBIN (HGB A1C): Hemoglobin A1C: 7.9

## 2016-07-03 MED ORDER — GLIPIZIDE 5 MG PO TABS: 5.0000 mg | ORAL_TABLET | Freq: Two times a day (BID) | ORAL | 3 refills | Status: DC

## 2016-07-03 MED ORDER — METFORMIN HCL ER 500 MG PO TB24: 1000.0000 mg | ORAL_TABLET | Freq: Two times a day (BID) | ORAL | 3 refills | Status: DC

## 2016-07-03 MED ORDER — GLIPIZIDE 5 MG PO TABS: 5.0000 mg | ORAL_TABLET | Freq: Two times a day (BID) | ORAL | 1 refills | Status: DC

## 2016-07-03 NOTE — Patient Instructions (Signed)
Please continue: - Metformin ER 1000 mg 2x a day with meals  Please add: - Glipizide 5 mg 2x a day before meals.  Please return in 1.5 months with your sugar log.

## 2016-07-03 NOTE — Progress Notes (Signed)
Patient ID: Bethany Miller, female   DOB: Jul 22, 1945, 70 y.o.   MRN: [id number]  HPI: Bethany Miller is a 70 y.o.-year-old female, returning for f/u for DM2, dx 2010, non-insulin-dependent, uncontrolled, with complications (CKD). Last visit almost 68 years ago!  Since last visit, she moved to [facility] >> now back in [place].  She had R knee steroid injections >> sugars 300.     Last hemoglobin A1c was: Lab Results  Component Value Date   HGBA1C 8.8 (H) 03/28/2016   HGBA1C 8.1 (H) 11/16/2015   HGBA1C 7.3 (H) 12/23/2013    Pt is on a regimen of: - Metformin ER 1000 mg 2x a day She was previously on Invokana, Jardiance 10 mg daily >> too expensive. Tried Cycloset >> too expensive Tried Januvia >>  too expensive. Tried Metformin 500 mg po >> switched to ER >> but still severe diarrhea >> stopped. She was on Glimepiride >> hypoglycemia in the 60's on 2 mg daily. She had rapid acting insulin with steroid shots before.   She now has [employer] part D for meds, [employer], and [employer] for the rest.  Pt checks her sugars once a day in am - 30 day average 206 per meter review: - am: 136-177, one 75 >> 120 after starting Metformin >> 143-191 >> 150-287 (300s with Pred) - Lunch: 120-138 >> low 100s >> n/c - Dinnertime: 128-121 >> low 100s >> 67, 69, 77, 91, 101 >> n/c - 2h after dinner: 170-180 No lows. Lowest sugar was 67 - in late pm >> 150;  she has hypoglycemia awareness at 80.  Highest sugar was 180 >> 300s.  ReliOn meter.  - Mild CKD, last BUN/creatinine:  Lab Results  Component Value Date   BUN 21 03/28/2016   CREATININE 1.34 (H) 03/28/2016   - last set of lipids: Lab Results  Component Value Date   CHOL 233 (H) 03/28/2016   HDL 52 03/28/2016   LDLCALC 146 (H) 03/28/2016   LDLDIRECT 149.6 01/26/2013   TRIG 173 (H) 03/28/2016   CHOLHDL 4.5 03/28/2016  Stopped Lipitor 10. - last eye exam was in 2017 No DR.  - no numbness and tingling in her feet.  She also has a  history of HL, HTN.   ROS: Constitutional: no weight gain/loss, no fatigue, no subjective hyperthermia/hypothermia, + nocturia, + dysuria - she is on Cipro Eyes: no blurry vision, no xerophthalmia ENT: no sore throat, no nodules palpated in throat, no dysphagia/odynophagia, no hoarseness Cardiovascular: no CP/SOB/palpitations/leg swelling Respiratory: no cough/SOB Gastrointestinal: no N/V/D/C Musculoskeletal: no muscle/joint aches Skin: no rashes  I reviewed pt's medications, allergies, PMH, social hx, family hx, and changes were documented in the history of present illness. Otherwise, unchanged from my initial visit note.  PE: BP 140/60 (BP Location: Right Arm, Patient Position: Sitting, Cuff Size: Large)   Pulse 63   Temp 97.5 F (36.4 C) (Oral)   Ht 5\' 2"  (1.575 m)   Wt 211 lb (95.7 kg)   SpO2 95%   BMI 38.59 kg/m  Wt Readings from Last 3 Encounters:  07/03/16 211 lb (95.7 kg)  03/28/16 221 lb 3.2 oz (100.3 kg)  11/16/15 214 lb 3.2 oz (97.2 kg)   Constitutional: obese, in NAD Eyes: PERRLA, EOMI, no exophthalmos ENT: moist mucous membranes, no thyromegaly, no cervical lymphadenopathy Cardiovascular: RRR, No MRG Respiratory: CTA B Gastrointestinal: abdomen soft, NT, ND, BS+ Musculoskeletal: no deformities, strength intact in all 4 Skin: moist, warm, no rashes Neurological: no tremor with outstretched  hands, DTR normal in all 4  ASSESSMENT: 1. DM2, non-insulin-dependent, uncontrolled, with - CKD  PLAN:  1. Patient with uncontrolled diabetes, on oral antidiabetic regimen, now returning after a long absence.She quit her very stressful job at the end of Oct >> now focusing on her health. She is sleeping and eating better. - Sugars are higher at this visit. She is only on Metformin ER >> whish she tolerates well. She is only checking sugars in am >> advised to start checking 2x a day and bring log at next visit - until then, as we do not have many options for tx 2/2 cost >>  will start Glipizide 5 mg 2x a day  - HbA1c today: 7.9% (improved) - I suggested to:  Patient Instructions  Please continue: - Metformin ER 1000 mg 2x a day with meals  Please add: - Glipizide 5 mg 2x a day before meals.  Please return in 1.5 months with your sugar log.   - continue checking her sugars at different times of the day - check once a day, rotating checks - had flu vaccine this season - Return to clinic in 1.5 mo with sugar log   Philemon Kingdom, MD PhD [facility]

## 2016-07-28 ENCOUNTER — Other Ambulatory Visit: Payer: Self-pay | Admitting: Family Medicine

## 2016-07-28 DIAGNOSIS — E785 Hyperlipidemia, unspecified: Secondary | ICD-10-CM

## 2016-08-12 ENCOUNTER — Encounter: Payer: Self-pay | Admitting: Family Medicine

## 2016-08-12 ENCOUNTER — Other Ambulatory Visit: Payer: Self-pay | Admitting: Family Medicine

## 2016-08-12 DIAGNOSIS — F329 Major depressive disorder, single episode, unspecified: Secondary | ICD-10-CM

## 2016-08-12 DIAGNOSIS — F32A Depression, unspecified: Secondary | ICD-10-CM

## 2016-08-12 DIAGNOSIS — K219 Gastro-esophageal reflux disease without esophagitis: Secondary | ICD-10-CM

## 2016-08-12 MED ORDER — CITALOPRAM HYDROBROMIDE 40 MG PO TABS: ORAL_TABLET | ORAL | 0 refills | Status: DC

## 2016-08-12 MED ORDER — ESOMEPRAZOLE MAGNESIUM 40 MG PO CPDR: 40.0000 mg | DELAYED_RELEASE_CAPSULE | Freq: Every day | ORAL | 2 refills | Status: DC

## 2016-08-22 ENCOUNTER — Other Ambulatory Visit: Payer: Self-pay | Admitting: Family Medicine

## 2016-08-22 DIAGNOSIS — K219 Gastro-esophageal reflux disease without esophagitis: Secondary | ICD-10-CM

## 2016-08-27 ENCOUNTER — Telehealth: Payer: Self-pay | Admitting: Family Medicine

## 2016-08-27 NOTE — Telephone Encounter (Signed)
Caller name: Kendrick Fries Relationship to patient: Mcarthur Rossetti Can be reached: [phone] Pharmacy:  [place], [place] [phone] (Phone) [phone] (Fax)     Reason for call: Nexium is not on the patient formulary and insurance will not cover it. They wll cover omeprazole capsule. Plse adv

## 2016-08-28 ENCOUNTER — Encounter: Payer: Self-pay | Admitting: Internal Medicine

## 2016-08-28 ENCOUNTER — Ambulatory Visit (INDEPENDENT_AMBULATORY_CARE_PROVIDER_SITE_OTHER): Payer: Medicare Other | Admitting: Internal Medicine

## 2016-08-28 VITALS — BP 124/70 | HR 64 | Ht 62.0 in | Wt 212.0 lb

## 2016-08-28 DIAGNOSIS — E1121 Type 2 diabetes mellitus with diabetic nephropathy: Secondary | ICD-10-CM | POA: Diagnosis not present

## 2016-08-28 MED ORDER — OMEPRAZOLE 20 MG PO CPDR
20.0000 mg | DELAYED_RELEASE_CAPSULE | Freq: Every day | ORAL | 1 refills | Status: DC
Start: 2016-08-28 — End: 2018-03-02

## 2016-08-28 NOTE — Patient Instructions (Addendum)
Please continue: - Glipizide 5 mg 2x a day before meals.  Please move all Metformin at night >> 2000 mg at dinner.  Please return in 3 months with your sugar log.

## 2016-08-28 NOTE — Telephone Encounter (Signed)
Please make pt aware first Then we can send omeprazole to pharmacy 20 mg #90 3 refills 1 po qd

## 2016-08-28 NOTE — Telephone Encounter (Signed)
Patient was contacted and ok with this change Sent to her local [facility] as she requested

## 2016-08-28 NOTE — Progress Notes (Signed)
Patient ID: Bethany Miller, female   DOB: Aug 28, 1945, 71 y.o.   MRN: [id number]  HPI: Golda Zavalza is a 71 y.o.-year-old female, returning for f/u for DM2, dx 2010, non-insulin-dependent, uncontrolled, with complications (CKD). Last visit 2 mo ago. She now has [employer] part D for meds, [place], and [employer] for the rest.   Her husband died in 08-25-2016. She cries today in the office.  She is trying to move. She is babysitting her grandson.   Last hemoglobin A1c was: Lab Results  Component Value Date   HGBA1C 7.9% 07/03/2016   HGBA1C 8.8 (H) 03/28/2016   HGBA1C 8.1 (H) 11/16/2015    Pt is on a regimen of: - Metformin ER 1000 mg 2x a day >> tolerates this well - Glipizide 5 mg 2x a day She was previously on Invokana, Jardiance 10 mg daily >> too expensive. Tried Cycloset >> too expensive Tried Januvia >>  too expensive. Tried Metformin 500 mg po >> severe diarrhea. She was on Glimepiride >> hypoglycemia in the 60's on 2 mg daily. She had rapid acting insulin with steroid shots before.   Pt checks her sugars 2x a day: - am: 120 after starting Metformin >> 143-191 >> 150-287 (300s with Pred) >> 150-160 - Lunch: 120-138 >> low 100s >> n/c - Dinnertime: 128-121 >> low 100s >> 67, 69, 77, 91, 101 >> n/c  - 2h after dinner: 170-180 >> 120-130 No lows. Lowest sugar was 67 - in late pm >> 150 >> 90;  she has hypoglycemia awareness at 90.  Highest sugar was 180 >> 300s >> 160  ReliOn meter.  - Mild CKD, last BUN/creatinine:  Lab Results  Component Value Date   BUN 21 03/28/2016   CREATININE 1.34 (H) 03/28/2016   - last set of lipids: Lab Results  Component Value Date   CHOL 233 (H) 03/28/2016   HDL 52 03/28/2016   LDLCALC 146 (H) 03/28/2016   LDLDIRECT 149.6 01/26/2013   TRIG 173 (H) 03/28/2016   CHOLHDL 4.5 03/28/2016  Stopped Lipitor 10. - last eye exam was in 2017 No DR.  - no numbness and tingling in her feet. She will see a podiatrist (ingrown toenail).  She also  has a history of HL, HTN.   ROS: Constitutional: no weight gain/loss, no fatigue, no subjective hyperthermia/hypothermia Eyes: no blurry vision, no xerophthalmia ENT: no sore throat, no nodules palpated in throat, no dysphagia/odynophagia, no hoarseness Cardiovascular: no CP/SOB/palpitations/leg swelling Respiratory: no cough/SOB Gastrointestinal: no N/V/D/C Musculoskeletal: no muscle/joint aches Skin: no rashes  I reviewed pt's medications, allergies, PMH, social hx, family hx, and changes were documented in the history of present illness. Otherwise, unchanged from my initial visit note.  PE: BP 124/70 (BP Location: Left Arm, Patient Position: Sitting)   Pulse 64   Ht 5\' 2"  (1.575 m)   Wt 212 lb (96.2 kg)   SpO2 94%   BMI 38.78 kg/m  Wt Readings from Last 3 Encounters:  08/28/16 212 lb (96.2 kg)  07/03/16 211 lb (95.7 kg)  03/28/16 221 lb 3.2 oz (100.3 kg)   Constitutional: obese, in NAD Eyes: PERRLA, EOMI, no exophthalmos ENT: moist mucous membranes, no thyromegaly, no cervical lymphadenopathy Cardiovascular: RRR, + 1/6 SEM, no RG Respiratory: CTA B Gastrointestinal: abdomen soft, NT, ND, BS+ Musculoskeletal: no deformities, strength intact in all 4 Skin: moist, warm, no rashes Neurological: no tremor with outstretched hands, DTR normal in all 4  ASSESSMENT: 1. DM2, non-insulin-dependent, uncontrolled, with co: - CKD  PLAN:  1. Patient with uncontrolled diabetes, on oral antidiabetic regimen, now with better control after adding Glipizide. She quit her very stressful job at the end of 04/2016 >> now focusing on her health. However, her husband died last month >> she is having a hard time with adjusting to living w/o him. - sugars are higher in am >> will move all Metformin ER at dinnertime - reviewed last HbA1c: 7.9% (improved) - I suggested to:  Patient Instructions  Please continue: - Glipizide 5 mg 2x a day before meals.  Please move all Metformin at night >>  2000 mg at dinner.  Please return in 3 months with your sugar log.   - continue checking her sugars at different times of the day - check once a day, rotating checks - had flu vaccine this season - Return to clinic in 3 mo with sugar log   Philemon Kingdom, MD PhD [facility]

## 2016-08-28 NOTE — Addendum Note (Signed)
Addended by: Sharon Seller B on: 08/28/2016 01:55 PM   Modules accepted: Orders

## 2016-10-30 ENCOUNTER — Encounter: Payer: Self-pay | Admitting: Medical

## 2016-10-30 ENCOUNTER — Ambulatory Visit (INDEPENDENT_AMBULATORY_CARE_PROVIDER_SITE_OTHER): Payer: Medicare Other | Admitting: Medical

## 2016-10-30 VITALS — BP 130/64 | HR 77 | Temp 98.1°F | Resp 16 | Ht 62.0 in

## 2016-10-30 DIAGNOSIS — R05 Cough: Secondary | ICD-10-CM | POA: Diagnosis not present

## 2016-10-30 DIAGNOSIS — R059 Cough, unspecified: Secondary | ICD-10-CM

## 2016-10-30 DIAGNOSIS — J40 Bronchitis, not specified as acute or chronic: Secondary | ICD-10-CM | POA: Diagnosis not present

## 2016-10-30 MED ORDER — HYDROCODONE-HOMATROPINE 5-1.5 MG/5ML PO SYRP: 5.0000 mL | ORAL_SOLUTION | Freq: Three times a day (TID) | ORAL | 0 refills | Status: DC | PRN

## 2016-10-30 MED ORDER — DOXYCYCLINE HYCLATE 100 MG PO TABS
100.0000 mg | ORAL_TABLET | Freq: Two times a day (BID) | ORAL | 0 refills | Status: DC
Start: 2016-10-30 — End: 2016-11-11

## 2016-10-30 MED ORDER — ALBUTEROL SULFATE HFA 108 (90 BASE) MCG/ACT IN AERS
2.0000 | INHALATION_SPRAY | Freq: Four times a day (QID) | RESPIRATORY_TRACT | 0 refills | Status: DC | PRN
Start: 2016-10-30 — End: 2017-04-29

## 2016-10-30 NOTE — Progress Notes (Signed)
Subjective:    Patient ID: Bethany Miller, female    DOB: 11/19/1945, 71 y.o.   MRN: [id number]  HPI  Pt in with cough for 3 weeks. Pt states cough is productive and she feels congested in her chest. Pt feels some fatigue. She has been sleeping poorly due to cough. Coughing at times makes her dry heave. Some ha after severe cough episodes. Pt never smoked. Pt states no wheezing.  No fever but states sometimes will sweat.   Review of Systems  Constitutional: Positive for fatigue. Negative for chills and fever.  HENT: Negative for congestion, postnasal drip, sinus pressure, sneezing, sore throat and trouble swallowing.        Mild hoarse voice.  Respiratory: Positive for cough. Negative for chest tightness, shortness of breath and wheezing.   Cardiovascular: Negative for chest pain and palpitations.  Gastrointestinal: Negative for abdominal pain.  Musculoskeletal: Negative for back pain.  Skin: Negative for rash.  Neurological: Negative for dizziness, syncope, weakness and light-headedness.  Hematological: Negative for adenopathy. Does not bruise/bleed easily.  Psychiatric/Behavioral: Negative for behavioral problems. The patient is not nervous/anxious.     Past Medical History:  Diagnosis Date  . Diabetes mellitus   . Hyperlipidemia   . Hypertension   . Murmur      Social History   Social History  . Marital status: Married    Spouse name: N/A  . Number of children: N/A  . Years of education: N/A   Occupational History  . Not on file.   Social History Main Topics  . Smoking status: Never Smoker  . Smokeless tobacco: Never Used  . Alcohol use Yes  . Drug use: No  . Sexual activity: Yes    Partners: Male   Other Topics Concern  . 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 TOTAL KNEE ARTHROPLASTY      Family History  Problem  Relation Age of Onset  . Melanoma    . Arthritis    . Throat cancer Brother   . Heart disease Brother     MI  . Cancer Brother 80    throat,   . Hyperlipidemia Brother   . Cirrhosis Father   . Alcohol abuse Father   . Heart disease Brother   . Cancer Brother   . Arthritis Mother   . Hypertension Sister   . Arthritis Sister   . Cancer Brother   . Cancer Brother 17    melanoma  . Hypertension Brother   . Hyperlipidemia Brother     No Known Allergies  Current Outpatient Prescriptions on File Prior to Visit  Medication Sig Dispense Refill  . atorvastatin (LIPITOR) 40 MG tablet TAKE 1 TABLET EVERY DAY 90 tablet 1  . citalopram (CELEXA) 40 MG tablet TAKE 1 TABLET (40 MG TOTAL) BY MOUTH DAILY. 90 tablet 0  . ezetimibe (ZETIA) 10 MG tablet Take 1 tablet (10 mg total) by mouth daily. 90 tablet 1  . fenofibrate 160 MG tablet Take 1 tablet (160 mg total) by mouth daily. 30 tablet 2  . FreeStyle Unistick II Lancets MISC by Does not apply route.      Marland Kitchen glipiZIDE (GLUCOTROL) 5 MG tablet Take 1 tablet (5 mg total) by mouth 2 (two) times daily before a meal. 30 tablet 1  . Glucosamine-Chondroitin (MOVE FREE PO)  Take by mouth.    Marland Kitchen glucose blood (FREESTYLE INSULINX TEST) test strip Check blood sugar once a day 300 each 3  . lisinopril-hydrochlorothiazide (PRINZIDE,ZESTORETIC) 10-12.5 MG tablet TAKE 1 TABLET BY MOUTH EVERY DAY 90 tablet 1  . Loratadine-Pseudoephedrine (CLARITIN-D 12 HOUR PO) Take by mouth.    . Melatonin 5 MG TABS Take 1 tablet by mouth daily.    . metFORMIN (GLUCOPHAGE-XR) 500 MG 24 hr tablet Take 2 tablets (1,000 mg total) by mouth 2 (two) times daily after a meal. 360 tablet 3  . Multiple Vitamins-Minerals (MULTIVITAMIN ADULT PO) Take by mouth. Diabetes Daily Pack    . omeprazole (PRILOSEC) 20 MG capsule Take 1 capsule (20 mg total) by mouth daily. 90 capsule 1   No current facility-administered medications on file prior to visit.     BP 130/64 (BP Location: Right Arm,  Patient Position: Sitting, Cuff Size: Large)   Pulse 77   Temp 98.1 F (36.7 C) (Oral)   Resp 16   Ht 5\' 2"  (1.575 m)   SpO2 96%       Objective:   Physical Exam  General  Mental Status - Alert. General Appearance - Well groomed. Not in acute distress.  Skin Rashes- No Rashes.  HEENT Head- Normal. Ear Auditory Canal - Left- Normal. Right - Normal.Tympanic Membrane- Left- Normal. Right- Normal. Eye Sclera/Conjunctiva- Left- Normal. Right- Normal. Nose & Sinuses Nasal Mucosa- Left-  Boggy and Congested. Right-  Boggy and  Congested.Bilateral no  maxillary and no  frontal sinus pressure. Mouth & Throat Lips: Upper Lip- Normal: no dryness, cracking, pallor, cyanosis, or vesicular eruption. Lower Lip-Normal: no dryness, cracking, pallor, cyanosis or vesicular eruption. Buccal Mucosa- Bilateral- No Aphthous ulcers. Oropharynx- No Discharge or Erythema. Tonsils: Characteristics- Bilateral- No Erythema or Congestion. Size/Enlargement- Bilateral- No enlargement. Discharge- bilateral-None.  Neck Neck- Supple. No Masses.   Chest and Lung Exam Auscultation: Breath Sounds:-Clear even and unlabored.  Cardiovascular Auscultation:Rythm- Regular, rate and rhythm. Murmurs & Other Heart Sounds:Ausculatation of the heart reveal- No Murmurs.  Lymphatic Head & Neck General Head & Neck Lymphatics: Bilateral: Description- No Localized lymphadenopathy.       Assessment & Plan:  You appear to have bronchitis. Rest hydrate and tylenol for fever. I am prescribing cough medicine hycodan, and doxycyline antibiotic.  For any wheezing making albuterol inhaler available.  You should gradually get better. If not then notify us and would recommend a chest xray.  Follow up in 7-10 days or as needed  Anniah Glick, Percell Miller, [employer]

## 2016-10-30 NOTE — Progress Notes (Signed)
Pre visit review using our clinic review tool, if applicable. No additional management support is needed unless otherwise documented below in the visit note. 

## 2016-10-30 NOTE — Patient Instructions (Addendum)
You appear to have bronchitis. Rest hydrate and tylenol for fever. I am prescribing cough medicine hycodan, and doxycycline inantibiotic.  For any wheezing making albuterol inhaler available.  You should gradually get better. If not then notify us and would recommend a chest xray.  Follow up in 7-10 days or as needed

## 2016-11-06 ENCOUNTER — Encounter: Payer: Self-pay | Admitting: Family Medicine

## 2016-11-11 ENCOUNTER — Ambulatory Visit (HOSPITAL_BASED_OUTPATIENT_CLINIC_OR_DEPARTMENT_OTHER)
Admission: RE | Admit: 2016-11-11 | Discharge: 2016-11-11 | Disposition: A | Discharge status: Home / Self Care | Payer: Medicare Other | Source: Ambulatory Visit | Attending: Family Medicine | Admitting: Family Medicine

## 2016-11-11 ENCOUNTER — Encounter: Payer: Self-pay | Admitting: Family Medicine

## 2016-11-11 ENCOUNTER — Ambulatory Visit (INDEPENDENT_AMBULATORY_CARE_PROVIDER_SITE_OTHER): Payer: Medicare Other | Admitting: Family Medicine

## 2016-11-11 VITALS — BP 132/60 | HR 72 | Temp 97.7°F | Resp 16 | Ht 62.0 in | Wt 206.4 lb

## 2016-11-11 DIAGNOSIS — J40 Bronchitis, not specified as acute or chronic: Secondary | ICD-10-CM | POA: Diagnosis not present

## 2016-11-11 DIAGNOSIS — R059 Cough, unspecified: Secondary | ICD-10-CM

## 2016-11-11 DIAGNOSIS — R05 Cough: Secondary | ICD-10-CM | POA: Insufficient documentation

## 2016-11-11 DIAGNOSIS — E785 Hyperlipidemia, unspecified: Secondary | ICD-10-CM | POA: Diagnosis not present

## 2016-11-11 DIAGNOSIS — IMO0002 Reserved for concepts with insufficient information to code with codable children: Secondary | ICD-10-CM

## 2016-11-11 DIAGNOSIS — E1165 Type 2 diabetes mellitus with hyperglycemia: Secondary | ICD-10-CM | POA: Diagnosis not present

## 2016-11-11 DIAGNOSIS — I10 Essential (primary) hypertension: Secondary | ICD-10-CM | POA: Diagnosis not present

## 2016-11-11 DIAGNOSIS — E1151 Type 2 diabetes mellitus with diabetic peripheral angiopathy without gangrene: Secondary | ICD-10-CM

## 2016-11-11 LAB — CBC WITH DIFFERENTIAL/PLATELET
BASOS ABS: 0 10*3/uL (ref 0.0–0.1)
Basophils Relative: 0.5 % (ref 0.0–3.0)
Eosinophils Absolute: 0.1 10*3/uL (ref 0.0–0.7)
Eosinophils Relative: 1.9 % (ref 0.0–5.0)
HCT: 36.6 % (ref 36.0–46.0)
Hemoglobin: 12.2 g/dL (ref 12.0–15.0)
LYMPHS ABS: 1.6 10*3/uL (ref 0.7–4.0)
Lymphocytes Relative: 25.2 % (ref 12.0–46.0)
MCHC: 33.3 g/dL (ref 30.0–36.0)
MCV: 88.1 fl (ref 78.0–100.0)
MONO ABS: 0.4 10*3/uL (ref 0.1–1.0)
MONOS PCT: 6.4 % (ref 3.0–12.0)
NEUTROS PCT: 66 % (ref 43.0–77.0)
Neutro Abs: 4.2 10*3/uL (ref 1.4–7.7)
Platelets: 230 10*3/uL (ref 150.0–400.0)
RBC: 4.16 Mil/uL (ref 3.87–5.11)
RDW: 13.7 % (ref 11.5–15.5)
WBC: 6.3 10*3/uL (ref 4.0–10.5)

## 2016-11-11 LAB — LIPID PANEL
CHOLESTEROL: 155 mg/dL (ref 0–200)
HDL: 41.5 mg/dL (ref >39.00)
LDL CALC: 80 mg/dL (ref 0–99)
NONHDL: 113.57
Total CHOL/HDL Ratio: 4
Triglycerides: 170 mg/dL — ABNORMAL HIGH (ref 0.0–149.0)
VLDL: 34 mg/dL (ref 0.0–40.0)

## 2016-11-11 LAB — COMPREHENSIVE METABOLIC PANEL
ALK PHOS: 85 U/L (ref 39–117)
ALT: 11 U/L (ref 0–35)
AST: 12 U/L (ref 0–37)
Albumin: 3.8 g/dL (ref 3.5–5.2)
BUN: 23 mg/dL (ref 6–23)
CO2: 27 meq/L (ref 19–32)
Calcium: 9.4 mg/dL (ref 8.4–10.5)
Chloride: 104 mEq/L (ref 96–112)
Creatinine, Ser: 1.1 mg/dL (ref 0.40–1.20)
GFR: 52.31 mL/min — ABNORMAL LOW (ref >60.00)
GLUCOSE: 183 mg/dL — AB (ref 70–99)
POTASSIUM: 4.4 meq/L (ref 3.5–5.1)
SODIUM: 136 meq/L (ref 135–145)
TOTAL PROTEIN: 6.5 g/dL (ref 6.0–8.3)
Total Bilirubin: 0.5 mg/dL (ref 0.2–1.2)

## 2016-11-11 LAB — HEMOGLOBIN A1C: HEMOGLOBIN A1C: 8.6 % — AB (ref 4.6–6.5)

## 2016-11-11 MED ORDER — AZITHROMYCIN 250 MG PO TABS: ORAL_TABLET | ORAL | 0 refills | Status: DC

## 2016-11-11 NOTE — Progress Notes (Signed)
Patient ID: Bethany Miller, female   DOB: October 18, 1945, 71 y.o.   MRN: [id number]     Subjective:    Patient ID: Bethany Miller, female    DOB: 05/26/1946, 71 y.o.   MRN: [id number]  Chief Complaint  Patient presents with  . Cough  . Hyperlipidemia  . Diabetes    Cough  This is a chronic problem. The current episode started more than 1 month ago (6 weeks). The problem has been gradually worsening. The problem occurs constantly. The cough is productive of sputum (frothy looking). Associated symptoms include shortness of breath. Pertinent negatives include no chest pain, fever, headaches, rash or wheezing. Associated symptoms comments: Chest congestion . The symptoms are aggravated by lying down and other. She has tried OTC cough suppressant and prescription cough suppressant for the symptoms. The treatment provided no relief.  Hyperlipidemia  This is a chronic problem. The current episode started more than 1 year ago. Associated symptoms include shortness of breath. Pertinent negatives include no chest pain. There are no compliance problems.  Risk factors for coronary artery disease include post-menopausal, a sedentary lifestyle, obesity, hypertension, dyslipidemia and diabetes mellitus.  Diabetes  She presents for her follow-up diabetic visit. She has type 2 diabetes mellitus. No MedicAlert identification noted. Her disease course has been stable. Pertinent negatives for hypoglycemia include no headaches. Pertinent negatives for diabetes include no blurred vision and no chest pain. There are no hypoglycemic complications. Symptoms are stable. There are no diabetic complications. Risk factors for coronary artery disease include hypertension, obesity, post-menopausal, sedentary lifestyle, dyslipidemia and diabetes mellitus. Current diabetic treatment includes oral agent (dual therapy). She is compliant with treatment all of the time. When asked about meal planning, she reported none. She has not had a  previous visit with a dietitian. She monitors blood glucose at home 1-2 x per day. Blood glucose monitoring compliance is good. There is no change in her home blood glucose trend. An ACE inhibitor/angiotensin II receptor blocker is being taken. She does not see a podiatrist.Eye exam is not current.   Patient is in today for cough and blood work for cholesterol and diabetes.  She saw PA about 2 weeks ago.  For her cholesterol she has not been taking the fenofibrate, but she wants to know what her numbers are.  Past Medical History:  Diagnosis Date  . Diabetes mellitus   . Hyperlipidemia   . Hypertension   . Murmur     Past Surgical History:  Procedure Laterality Date  . TOTAL KNEE ARTHROPLASTY      Family History  Problem Relation Age of Onset  . Melanoma    . Arthritis    . Throat cancer Brother   . Heart disease Brother     MI  . Cancer Brother 80    throat,   . Hyperlipidemia Brother   . Cirrhosis Father   . Alcohol abuse Father   . Heart disease Brother   . Cancer Brother   . Arthritis Mother   . Hypertension Sister   . Arthritis Sister   . Cancer Brother   . Cancer Brother 53    melanoma  . Hypertension Brother   . Hyperlipidemia Brother     Social History   Social History  . Marital status: Married    Spouse name: N/A  . Number of children: N/A  . Years of education: N/A   Occupational History  . Not on file.   Social History Main Topics  . Smoking status: Never  Smoker  . Smokeless tobacco: Never Used  . Alcohol use Yes  . Drug use: No  . Sexual activity: Yes    Partners: Male   Other Topics Concern  . 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 atorvastatin (LIPITOR) 40 MG tablet TAKE 1 TABLET EVERY DAY 90 tablet 1  . citalopram (CELEXA) 40 MG tablet TAKE 1 TABLET (40 MG TOTAL) BY MOUTH DAILY.  90 tablet 0  . fenofibrate 160 MG tablet Take 1 tablet (160 mg total) by mouth daily. 30 tablet 2  . FreeStyle Unistick II Lancets MISC by Does not apply route.      Marland Kitchen glipiZIDE (GLUCOTROL) 5 MG tablet Take 1 tablet (5 mg total) by mouth 2 (two) times daily before a meal. 30 tablet 1  . Glucosamine-Chondroitin (MOVE FREE PO) Take by mouth.    Marland Kitchen glucose blood (FREESTYLE INSULINX TEST) test strip Check blood sugar once a day 300 each 3  . lisinopril-hydrochlorothiazide (PRINZIDE,ZESTORETIC) 10-12.5 MG tablet TAKE 1 TABLET BY MOUTH EVERY DAY 90 tablet 1  . Loratadine-Pseudoephedrine (CLARITIN-D 12 HOUR PO) Take by mouth.    . Melatonin 5 MG TABS Take 1 tablet by mouth daily.    . metFORMIN (GLUCOPHAGE-XR) 500 MG 24 hr tablet Take 2 tablets (1,000 mg total) by mouth 2 (two) times daily after a meal. 360 tablet 3  . Multiple Vitamins-Minerals (MULTIVITAMIN ADULT PO) Take by mouth. Diabetes Daily Pack    . omeprazole (PRILOSEC) 20 MG capsule Take 1 capsule (20 mg total) by mouth daily. 90 capsule 1  . albuterol (PROVENTIL HFA;VENTOLIN HFA) 108 (90 Base) MCG/ACT inhaler Inhale 2 puffs into the lungs every 6 (six) hours as needed for wheezing or shortness of breath. (Patient not taking: Reported on 11/11/2016) 1 Inhaler 0  . ezetimibe (ZETIA) 10 MG tablet Take 1 tablet (10 mg total) by mouth daily. (Patient not taking: Reported on 11/11/2016) 90 tablet 1  . doxycycline (VIBRA-TABS) 100 MG tablet Take 1 tablet (100 mg total) by mouth 2 (two) times daily. Can give caps or generic 20 tablet 0  . HYDROcodone-homatropine (HYCODAN) 5-1.5 MG/5ML syrup Take 5 mLs by mouth every 8 (eight) hours as needed for cough. 120 mL 0   No facility-administered medications prior to visit.     No Known Allergies  Review of Systems  Constitutional: Negative for fever and malaise/fatigue.  HENT: Negative for congestion.   Eyes: Negative for blurred vision.  Respiratory: Positive for cough and shortness of breath. Negative  for wheezing.   Cardiovascular: Negative for chest pain, palpitations and leg swelling.  Gastrointestinal: Negative for vomiting.  Musculoskeletal: Negative for back pain.  Skin: Negative for rash.  Neurological: Negative for loss of consciousness and headaches.       Objective:    Physical Exam  Constitutional: She is oriented to person, place, and time. She appears well-developed and well-nourished. No distress.  HENT:  Head: Normocephalic and atraumatic.  Eyes: Conjunctivae are normal.  Neck: Normal range of motion. No thyromegaly present.  Cardiovascular: Normal rate and regular rhythm.   Pulmonary/Chest: Effort normal and breath sounds normal. She has no wheezes.  Abdominal: Soft. Bowel sounds are normal. There is no tenderness.  Musculoskeletal: Normal range of motion. She exhibits no edema or deformity.  Neurological: She is alert and oriented to  person, place, and time.  Skin: Skin is warm and dry. She is not diaphoretic.  Psychiatric: She has a normal mood and affect.    BP 132/60 (BP Location: Left Arm, Cuff Size: Large)   Pulse 72   Temp 97.7 F (36.5 C) (Oral)   Resp 16   Ht 5\' 2"  (1.575 m)   Wt 206 lb 6.4 oz (93.6 kg)   SpO2 96%   BMI 37.75 kg/m  Wt Readings from Last 3 Encounters:  11/11/16 206 lb 6.4 oz (93.6 kg)  08/28/16 212 lb (96.2 kg)  07/03/16 211 lb (95.7 kg)     Lab Results  Component Value Date   WBC 7.1 03/28/2016   HGB 12.0 03/28/2016   HCT 36.5 03/28/2016   PLT 260 03/28/2016   GLUCOSE 129 (H) 03/28/2016   CHOL 233 (H) 03/28/2016   TRIG 173 (H) 03/28/2016   HDL 52 03/28/2016   LDLDIRECT 149.6 01/26/2013   LDLCALC 146 (H) 03/28/2016   ALT 12 03/28/2016   AST 13 03/28/2016   NA 138 03/28/2016   K 4.9 03/28/2016   CL 104 03/28/2016   CREATININE 1.34 (H) 03/28/2016   BUN 21 03/28/2016   CO2 26 03/28/2016   TSH 1.56 03/18/2011   HGBA1C 7.9% 07/03/2016   MICROALBUR 0.6 03/28/2016    Lab Results  Component Value Date   TSH 1.56  03/18/2011   Lab Results  Component Value Date   WBC 7.1 03/28/2016   HGB 12.0 03/28/2016   HCT 36.5 03/28/2016   MCV 89.0 03/28/2016   PLT 260 03/28/2016   Lab Results  Component Value Date   NA 138 03/28/2016   K 4.9 03/28/2016   CO2 26 03/28/2016   GLUCOSE 129 (H) 03/28/2016   BUN 21 03/28/2016   CREATININE 1.34 (H) 03/28/2016   BILITOT 0.3 03/28/2016   ALKPHOS 66 03/28/2016   AST 13 03/28/2016   ALT 12 03/28/2016   PROT 6.5 03/28/2016   ALBUMIN 4.1 03/28/2016   CALCIUM 9.2 03/28/2016   GFR 41.78 (L) 11/16/2015   Lab Results  Component Value Date   CHOL 233 (H) 03/28/2016   Lab Results  Component Value Date   HDL 52 03/28/2016   Lab Results  Component Value Date   LDLCALC 146 (H) 03/28/2016   Lab Results  Component Value Date   TRIG 173 (H) 03/28/2016   Lab Results  Component Value Date   CHOLHDL 4.5 03/28/2016   Lab Results  Component Value Date   HGBA1C 7.9% 07/03/2016       Assessment & Plan:   Problem List Items Addressed This Visit      Unprioritized   Bronchitis    z pack Check xray Check labs      Relevant Medications   azithromycin (ZITHROMAX Z-PAK) 250 MG tablet   DM (diabetes mellitus) type II uncontrolled, periph vascular disorder ([place])    con't meds Check labs Stable hgba1c acceptable, minimize simple carbs. Increase exercise as tolerated. Continue current meds      Relevant Orders   Hemoglobin A1c   Comprehensive metabolic panel   ESSENTIAL HYPERTENSION, BENIGN - Primary    Well controlled, no changes to meds. Encouraged heart healthy diet such as the DASH diet and exercise as tolerated.       Relevant Orders   Comprehensive metabolic panel   Hyperlipidemia LDL goal <70    Tolerating statin, encouraged heart healthy diet, avoid trans fats, minimize simple carbs and saturated fats. Increase exercise  as tolerated      Relevant Orders   Lipid panel   Comprehensive metabolic panel    Other Visit Diagnoses    Cough        Relevant Orders   DG Chest 2 View   CBC with Differential/Platelet      I have discontinued Ms. Buster's doxycycline and HYDROcodone-homatropine. I am also having her start on azithromycin. Additionally, I am having her maintain her FREESTYLE UNISTICK II LANCETS, glucose blood, Melatonin, Multiple Vitamins-Minerals (MULTIVITAMIN ADULT PO), Glucosamine-Chondroitin (MOVE FREE PO), fenofibrate, ezetimibe, lisinopril-hydrochlorothiazide, metFORMIN, glipiZIDE, atorvastatin, citalopram, Loratadine-Pseudoephedrine (CLARITIN-D 12 HOUR PO), omeprazole, and albuterol.  Meds ordered this encounter  Medications  . azithromycin (ZITHROMAX Z-PAK) 250 MG tablet    Sig: As directed    Dispense:  6 each    Refill:  0    Ann Held, DO

## 2016-11-11 NOTE — Progress Notes (Signed)
Pre visit review using our clinic review tool, if applicable. No additional management support is needed unless otherwise documented below in the visit note. 

## 2016-11-11 NOTE — Assessment & Plan Note (Signed)
Tolerating statin, encouraged heart healthy diet, avoid trans fats, minimize simple carbs and saturated fats. Increase exercise as tolerated 

## 2016-11-11 NOTE — Assessment & Plan Note (Signed)
z pack Check xray Check labs

## 2016-11-11 NOTE — Assessment & Plan Note (Signed)
Well controlled, no changes to meds. Encouraged heart healthy diet such as the DASH diet and exercise as tolerated.  °

## 2016-11-11 NOTE — Patient Instructions (Signed)

## 2016-11-11 NOTE — Assessment & Plan Note (Signed)
con't meds Check labs Stable hgba1c acceptable, minimize simple carbs. Increase exercise as tolerated. Continue current meds

## 2016-11-12 ENCOUNTER — Encounter: Payer: Self-pay | Admitting: Family Medicine

## 2016-11-13 ENCOUNTER — Other Ambulatory Visit: Payer: Self-pay | Admitting: Family Medicine

## 2016-11-13 MED ORDER — AMOXICILLIN-POT CLAVULANATE 875-125 MG PO TABS: 1.0000 | ORAL_TABLET | Freq: Two times a day (BID) | ORAL | 0 refills | Status: DC

## 2016-11-13 NOTE — Telephone Encounter (Signed)
augmentin 875 bid x 10 days #20

## 2016-11-17 ENCOUNTER — Other Ambulatory Visit: Payer: Self-pay | Admitting: Family Medicine

## 2016-11-17 DIAGNOSIS — E785 Hyperlipidemia, unspecified: Secondary | ICD-10-CM

## 2016-11-25 ENCOUNTER — Encounter: Payer: Self-pay | Admitting: Internal Medicine

## 2016-11-25 ENCOUNTER — Ambulatory Visit (INDEPENDENT_AMBULATORY_CARE_PROVIDER_SITE_OTHER): Payer: Medicare Other | Admitting: Internal Medicine

## 2016-11-25 VITALS — BP 120/64 | HR 64 | Wt 209.0 lb

## 2016-11-25 DIAGNOSIS — E1151 Type 2 diabetes mellitus with diabetic peripheral angiopathy without gangrene: Secondary | ICD-10-CM

## 2016-11-25 DIAGNOSIS — E1165 Type 2 diabetes mellitus with hyperglycemia: Secondary | ICD-10-CM

## 2016-11-25 DIAGNOSIS — IMO0002 Reserved for concepts with insufficient information to code with codable children: Secondary | ICD-10-CM

## 2016-11-25 MED ORDER — LINAGLIPTIN 5 MG PO TABS: 5.0000 mg | ORAL_TABLET | Freq: Every day | ORAL | 11 refills | Status: DC

## 2016-11-25 NOTE — Progress Notes (Addendum)
Patient ID: Bethany Miller, female   DOB: 1946-03-16, 71 y.o.   MRN: [id number]  HPI: Bethany Miller is a 71 y.o.-year-old female, returning for f/u for DM2, dx 2010, non-insulin-dependent, uncontrolled, with complications (CKD). Last visit3  mo ago. She has [employer] part D for meds, [employer], and [employer] for the rest.   She had severe allergies >> cough x 6 weeks, not completely resolved. She had 2 rounds of ABx.   Last hemoglobin A1c was: Lab Results  Component Value Date   HGBA1C 8.6 (H) 11/11/2016   HGBA1C 7.9% 07/03/2016   HGBA1C 8.8 (H) 03/28/2016    Pt is on a regimen of: - Metformin ER 1000 mg 2x a day >> 2000 mg with dinner >> did not see a difference >> 1000 mg 2x a day. - Glipizide 5 mg 2x a day She was previously on Invokana, Jardiance 10 mg daily >> too expensive. Tried Cycloset >> too expensive Tried Januvia >>  too expensive.  Tried Metformin 500 mg po >> severe diarrhea. She was on Glimepiride >> hypoglycemia in the 60's on 2 mg daily. She had rapid acting insulin with steroid shots before.   Pt checks her sugars 2x a day - no low: - am: 143-191 >> 150-287 (300s with Pred) >> 150-160 >> 147-181, 226 - 2h after b'fast: 97, 164, 173 - Lunch: 120-138 >> low 100s >> n/c >> 163 - 2h after lunch: 160 - Dinnertime: 128-121 >> low 100s >> 67, 69, 77, 91, 101 >> n/c  >> 83, 103 - 2h after dinner: 170-180 >> 120-130 >> 197, 201 No lows. Lowest sugar was 67 - in late pm >> 150 >> 90 >> 87;  she has hypoglycemia awareness at 90.  Highest sugar was 180 >> 300s >> 160 >> 200s  ReliOn meter.  - + mild CKD, last BUN/creatinine:  Lab Results  Component Value Date   BUN 23 11/11/2016   CREATININE 1.10 11/11/2016   - last set of lipids: Lab Results  Component Value Date   CHOL 155 11/11/2016   HDL 41.50 11/11/2016   LDLCALC 80 11/11/2016   LDLDIRECT 149.6 01/26/2013   TRIG 170.0 (H) 11/11/2016   CHOLHDL 4 11/11/2016  Off Lipitor. - last eye exam was in 2017 >> No  DR. - no numbness and tingling in her feet. She will see a podiatrist (ingrown toenail).  She also has a history of HL, HTN.   ROS: Constitutional: no weight gain/no weight loss, no fatigue, no subjective hyperthermia, no subjective hypothermia Eyes: no blurry vision, no xerophthalmia ENT: no sore throat, no nodules palpated in throat, no dysphagia, no odynophagia, no hoarseness Cardiovascular: no CP/no SOB/no palpitations/no leg swelling Respiratory: + cough/no SOB/no wheezing Gastrointestinal: no N/no V/no D/no C/no acid reflux Musculoskeletal: no muscle aches/no joint aches Skin: no rashes, no hair loss Neurological: no tremors/no numbness/no tingling/no dizziness  I reviewed pt's medications, allergies, PMH, social hx, family hx, and changes were documented in the history of present illness. Otherwise, unchanged from my initial visit note.  PE: BP 120/64 (BP Location: Left Arm, Patient Position: Sitting)   Pulse 64   Wt 209 lb (94.8 kg)   SpO2 95%   BMI 38.23 kg/m  Wt Readings from Last 3 Encounters:  11/25/16 209 lb (94.8 kg)  11/11/16 206 lb 6.4 oz (93.6 kg)  08/28/16 212 lb (96.2 kg)   Constitutional: overweight, in NAD Eyes: PERRLA, EOMI, no exophthalmos ENT: moist mucous membranes, no thyromegaly, no cervical lymphadenopathy Cardiovascular:  RRR, + 1/6 SEM Respiratory: CTA B Gastrointestinal: abdomen soft, NT, ND, BS+ Musculoskeletal: no deformities, strength intact in all 4 Skin: moist, warm, no rashes Neurological: no tremor with outstretched hands, DTR normal in all 4   ASSESSMENT: 1. DM2, non-insulin-dependent, uncontrolled, with co: - CKD  PLAN:  1. Patient with uncontrolled diabetes, on oral antidiabetic regimen, with improved control starting end of 10/2016 after she started to watch her diet more and also started to be more active. However, sugars are still high >> will try to add a DPP4 inh. - reviewed last HbA1c: 8.6% (higher)  - earlier this mo - I  suggested to:  Patient Instructions  Please continue: - Glipizide 5 mg 2x a day before meals. - Metformin 1000 mg 2x a day  Please start: - Tradjenta 5 mg before b'fast  Please return in 3 months with your sugar log.   - continue checking sugars at different times of the day - check 1x a day, rotating checks - advised for yearly eye exams >> she is UTD - advised for flu shot >> she is UTD - Return to clinic in 3 mo with sugar log   Addendum: - msg from pt: The generic diabetic med is $412.00 for 30 days. That is after insurance. Did not get filled any more suggestions or ideas? Can't afford that on retirement pay. Is there anything else you can possibly think of? Thanks  [employer]   Will try to send a PA.  Philemon Kingdom, MD PhD [facility] Endocrinology

## 2016-11-25 NOTE — Patient Instructions (Addendum)
Please continue: - Glipizide 5 mg 2x a day before meals. - Metformin 1000 mg 2x a day  Please start: - Tradjenta 5 mg before b'fast  Please return in 3 months with your sugar log.

## 2016-11-27 ENCOUNTER — Encounter: Payer: Self-pay | Admitting: Internal Medicine

## 2016-12-01 ENCOUNTER — Other Ambulatory Visit: Payer: Self-pay | Admitting: Internal Medicine

## 2016-12-01 MED ORDER — ALOGLIPTIN BENZOATE 25 MG PO TABS: ORAL_TABLET | ORAL | 11 refills | Status: DC

## 2016-12-03 ENCOUNTER — Other Ambulatory Visit: Payer: Self-pay

## 2016-12-03 MED ORDER — LINAGLIPTIN 5 MG PO TABS: 5.0000 mg | ORAL_TABLET | Freq: Every day | ORAL | 0 refills | Status: DC

## 2016-12-24 ENCOUNTER — Emergency Department (HOSPITAL_BASED_OUTPATIENT_CLINIC_OR_DEPARTMENT_OTHER): Payer: Medicare Other

## 2016-12-24 ENCOUNTER — Emergency Department (HOSPITAL_BASED_OUTPATIENT_CLINIC_OR_DEPARTMENT_OTHER)
Admission: EM | Admit: 2016-12-24 | Discharge: 2016-12-24 | Disposition: A | Discharge status: Home / Self Care | Payer: Medicare Other | Attending: Emergency Medicine | Admitting: Emergency Medicine

## 2016-12-24 ENCOUNTER — Encounter (HOSPITAL_BASED_OUTPATIENT_CLINIC_OR_DEPARTMENT_OTHER): Payer: Self-pay | Admitting: *Deleted

## 2016-12-24 DIAGNOSIS — E119 Type 2 diabetes mellitus without complications: Secondary | ICD-10-CM | POA: Insufficient documentation

## 2016-12-24 DIAGNOSIS — I10 Essential (primary) hypertension: Secondary | ICD-10-CM | POA: Insufficient documentation

## 2016-12-24 DIAGNOSIS — Z79899 Other long term (current) drug therapy: Secondary | ICD-10-CM | POA: Diagnosis not present

## 2016-12-24 DIAGNOSIS — Z7984 Long term (current) use of oral hypoglycemic drugs: Secondary | ICD-10-CM | POA: Insufficient documentation

## 2016-12-24 DIAGNOSIS — M25561 Pain in right knee: Secondary | ICD-10-CM | POA: Insufficient documentation

## 2016-12-24 DIAGNOSIS — M7121 Synovial cyst of popliteal space [Baker], right knee: Secondary | ICD-10-CM | POA: Diagnosis not present

## 2016-12-24 DIAGNOSIS — M7989 Other specified soft tissue disorders: Secondary | ICD-10-CM | POA: Diagnosis not present

## 2016-12-24 MED ORDER — KETOROLAC TROMETHAMINE 30 MG/ML IJ SOLN
30.0000 mg | Freq: Once | INTRAMUSCULAR | Status: AC
  Administered 2016-12-24: 30 mg via INTRAVENOUS
  Filled 2016-12-24: qty 1

## 2016-12-24 MED ORDER — OXYCODONE-ACETAMINOPHEN 5-325 MG PO TABS: 1.0000 | ORAL_TABLET | ORAL | 0 refills | Status: DC | PRN

## 2016-12-24 MED ORDER — FENTANYL CITRATE (PF) 100 MCG/2ML IJ SOLN
50.0000 ug | Freq: Once | INTRAMUSCULAR | Status: AC
  Administered 2016-12-24: 50 ug via INTRAVENOUS
  Filled 2016-12-24: qty 2

## 2016-12-24 NOTE — ED Triage Notes (Signed)
Pt c/o right knee pain x 3 days.  

## 2016-12-24 NOTE — ED Provider Notes (Signed)
[facility] Provider Note   CSN: [id number] Arrival date & time: 12/24/16  1819  By signing my name below, I, Bethany Miller, attest that this documentation has been prepared under the direction and in the presence of Bethany Johns, MD. Electronically Signed: Margit Miller, ED Scribe. 12/24/16. 6:37 PM.  History   Chief Complaint Chief Complaint  Patient presents with  . Extremity Pain    HPI Bethany Miller is a 71 y.o. female with a PMHx of bone spurs in her right knee, DM, HLD, and HTN who presents to the Emergency Department complaining of moderate, gradually worsening, right knee pain for the last 2-3 days. Pain radiates to her foot. Associated sx include right foot swelling. Pt took 3 ibuprofen ~ 1 hour ago. No recent injuries noted. She reports driving longer distances in a car more recently. No hx of blood clots. Pt denies CP and SOB.  The history is provided by the patient. No language interpreter was used.    Past Medical History:  Diagnosis Date  . Diabetes mellitus   . Hyperlipidemia   . Hypertension   . Murmur     Patient Active Problem List   Diagnosis Date Noted  . Bronchitis 11/11/2016  . Obesity (BMI 30-39.9) 12/23/2013  . Sinusitis 09/05/2013  . UTI (lower urinary tract infection) 03/05/2012  . ANXIETY 09/26/2009  . SINUSITIS 09/26/2009  . OTHER ACUTE REACTIONS TO STRESS 09/03/2009  . Depression with anxiety 06/18/2009  . Hyperlipidemia LDL goal <70 04/16/2009  . DM (diabetes mellitus) type II uncontrolled, periph vascular disorder ([place]) 01/25/2009  . MYALGIA 01/25/2009  . HYPERGLYCEMIA, FASTING 05/08/2008  . ESSENTIAL HYPERTENSION, BENIGN 01/17/2008  . CHEST PAIN 01/17/2008  . INSOMNIA 12/31/2007  . EDEMA 12/31/2007  . MURMUR 12/31/2007    Past Surgical History:  Procedure Laterality Date  . TOTAL KNEE ARTHROPLASTY      OB History    No data available       Home Medications    Prior to Admission medications   Medication  Sig Start Date End Date Taking? Authorizing Provider  albuterol (PROVENTIL HFA;VENTOLIN HFA) 108 (90 Base) MCG/ACT inhaler Inhale 2 puffs into the lungs every 6 (six) hours as needed for wheezing or shortness of breath. 10/30/16   Saguier, Percell Miller, PA-C  Alogliptin Benzoate 25 MG TABS Take by mouth 25 mg daily before breakfast 12/01/16   Philemon Kingdom, MD  atorvastatin (LIPITOR) 40 MG tablet TAKE 1 TABLET EVERY DAY 07/28/16   Carollee Herter, Alferd Apa, DO  citalopram (CELEXA) 40 MG tablet TAKE 1 TABLET (40 MG TOTAL) BY MOUTH DAILY. 08/12/16   Ann Held, DO  ezetimibe (ZETIA) 10 MG tablet Take 1 tablet (10 mg total) by mouth daily. 04/07/16   Roma Schanz R, DO  fenofibrate 160 MG tablet Take 1 tablet (160 mg total) by mouth daily. 12/07/15   Ann Held, DO  FreeStyle Unistick II Lancets MISC by Does not apply route.      [provider]  glipiZIDE (GLUCOTROL) 5 MG tablet Take 1 tablet (5 mg total) by mouth 2 (two) times daily before a meal. 07/03/16   Philemon Kingdom, MD  Glucosamine-Chondroitin (MOVE FREE PO) Take by mouth.    [provider]  glucose blood (FREESTYLE INSULINX TEST) test strip Check blood sugar once a day 06/23/13   Philemon Kingdom, MD  linagliptin (TRADJENTA) 5 MG TABS tablet Take 1 tablet (5 mg total) by mouth daily. 12/03/16   Philemon Kingdom, MD  lisinopril-hydrochlorothiazide (PRINZIDE,ZESTORETIC) 10-12.5 MG tablet TAKE 1 TABLET BY MOUTH EVERY DAY 04/07/16   Carollee Herter, Yvonne R, DO  Loratadine-Pseudoephedrine (CLARITIN-D 12 HOUR PO) Take by mouth.    [provider]  Melatonin 5 MG TABS Take 1 tablet by mouth daily.     [provider]  metFORMIN (GLUCOPHAGE-XR) 500 MG 24 hr tablet Take 2 tablets (1,000 mg total) by mouth 2 (two) times daily after a meal. 07/03/16   Philemon Kingdom, MD  Multiple Vitamins-Minerals (MULTIVITAMIN ADULT PO) Take by mouth. Diabetes Daily Pack    [provider]  omeprazole  (PRILOSEC) 20 MG capsule Take 1 capsule (20 mg total) by mouth daily. 08/28/16   Ann Held, DO  oxyCODONE-acetaminophen (PERCOCET) 5-325 MG tablet Take 1-2 tablets by mouth every 4 (four) hours as needed. 12/24/16   Bethany Johns, MD    Family History Family History  Problem Relation Age of Onset  . Throat cancer Brother   . Heart disease Brother        MI  . Cancer Brother 80       throat,   . Hyperlipidemia Brother   . Cirrhosis Father   . Alcohol abuse Father   . Heart disease Brother   . Cancer Brother   . Arthritis Mother   . Hypertension Sister   . Arthritis Sister   . Cancer Brother   . Cancer Brother 70       melanoma  . Hypertension Brother   . Hyperlipidemia Brother   . Melanoma Unknown   . Arthritis Unknown     Social History Social History  Substance Use Topics  . Smoking status: Never Smoker  . Smokeless tobacco: Never Used  . Alcohol use Yes     Allergies   Patient has no known allergies.   Review of Systems Review of Systems  Constitutional: Negative for fever.  Respiratory: Negative for shortness of breath.   Cardiovascular: Negative for chest pain.  Gastrointestinal: Negative for nausea and vomiting.  Musculoskeletal: Positive for arthralgias and joint swelling. Negative for back pain and neck pain.  Skin: Negative for wound.  Neurological: Negative for weakness, numbness and headaches.     Physical Exam Updated Vital Signs BP (!) 111/57 (BP Location: Right Arm)   Pulse 60   Temp 98.8 F (37.1 C)   Resp 18   Ht 5\' 2"  (1.575 m)   Wt 93.9 kg (207 lb)   SpO2 98%   BMI 37.86 kg/m   Physical Exam  Constitutional: She is oriented to person, place, and time. She appears well-developed and well-nourished.  HENT:  Head: Normocephalic and atraumatic.  Neck: Normal range of motion. Neck supple.  Cardiovascular: Normal rate.   Pulmonary/Chest: Effort normal.  Musculoskeletal: She exhibits edema and tenderness.  Tenderness to  right posterior aspect of knee and right posterior calf. No warmth or erythema. No joint diffusion. No pain to ankle or hip. Mild general swelling of right lower leg. Pedal pulses intact.   Neurological: She is alert and oriented to person, place, and time.  Skin: Skin is warm and dry.  Psychiatric: She has a normal mood and affect.     ED Treatments / Results  DIAGNOSTIC STUDIES: Oxygen Saturation is 98% on RA, normal by my interpretation.   COORDINATION OF CARE: 6:37 PM-Discussed next steps with pt which includes ice to help swelling and an XR. Pt verbalized understanding and is agreeable with the plan.   Labs (all labs ordered are listed,  but only abnormal results are displayed) Labs Reviewed - No data to display  EKG  EKG Interpretation None       Radiology US Venous Img Lower Unilateral Right  Result Date: 12/24/2016 CLINICAL DATA:  Posterior right knee pain x4 days EXAM: RIGHT LOWER EXTREMITY VENOUS DOPPLER ULTRASOUND TECHNIQUE: Gray-scale sonography with graded compression, as well as color Doppler and duplex ultrasound were performed to evaluate the lower extremity deep venous systems from the level of the common femoral vein and including the common femoral, femoral, profunda femoral, popliteal and calf veins including the posterior tibial, peroneal and gastrocnemius veins when visible. The superficial great saphenous vein was also interrogated. Spectral Doppler was utilized to evaluate flow at rest and with distal augmentation maneuvers in the common femoral, femoral and popliteal veins. COMPARISON:  None. FINDINGS: Contralateral Common Femoral Vein: Respiratory phasicity is normal and symmetric with the symptomatic side. No evidence of thrombus. Normal compressibility. Common Femoral Vein: No evidence of thrombus. Normal compressibility, respiratory phasicity and response to augmentation. Saphenofemoral Junction: No evidence of thrombus. Normal compressibility and flow on  color Doppler imaging. Profunda Femoral Vein: No evidence of thrombus. Normal compressibility and flow on color Doppler imaging. Femoral Vein: No evidence of thrombus. Normal compressibility, respiratory phasicity and response to augmentation. Popliteal Vein: No evidence of thrombus. Normal compressibility, respiratory phasicity and response to augmentation. Calf Veins: No evidence of thrombus. Normal compressibility and flow on color Doppler imaging. Superficial Great Saphenous Vein: No evidence of thrombus. Normal compressibility and flow on color Doppler imaging. Venous Reflux:  None. Other Findings: There is an approximately 6.1 x 2.4 x 4.6 cm simple appearing popliteal cyst. IMPRESSION: No evidence of DVT within the right lower extremity. Popliteal cyst measuring 6.1 x 2.4 x 4.6 cm. Electronically Signed   By: Ashley Royalty M.D.   On: 12/24/2016 20:52   Dg Knee Complete 4 Views Right  Result Date: 12/24/2016 CLINICAL DATA:  71 year old female with knee pain for 3 days. Swelling. No known injury. EXAM: RIGHT KNEE - COMPLETE 4+ VIEW COMPARISON:  None. FINDINGS: Severe tricompartmental degenerative spurring about the right knee. Moderate to severe medial and patellofemoral joint space loss. Bulky dystrophic appearing ossification is in the suprapatellar joint space or along the quadriceps tendon. No definite superimposed joint effusion. No acute fracture or dislocation identified. IMPRESSION: Severe tricompartmental right knee degeneration with no acute osseous abnormality identified. Electronically Signed   By: Genevie Ann M.D.   On: 12/24/2016 20:36    Procedures Procedures (including critical care time)  Medications Ordered in ED Medications  fentaNYL (SUBLIMAZE) injection 50 mcg (50 mcg Intravenous Given 12/24/16 1858)  ketorolac (TORADOL) 30 MG/ML injection 30 mg (30 mg Intravenous Given 12/24/16 2100)     Initial Impression / Assessment and Plan / ED Course  I have reviewed the triage vital signs  and the nursing notes.  Pertinent labs & imaging results that were available during my care of the patient were reviewed by me and considered in my medical decision making (see chart for details).     Patient presents with pain behind her right knee. There is no joint effusion. No suggestions of infection. Ultrasound shows a large popliteal cyst. There is no evidence of DVT. No fractures noted. She does have some degenerative changes on x-ray. She was discharged home in good condition. She was given pain medications in the ED. She was advised in ice and elevation. She was placed in Ace wrap around the knee. She has a walker to  use at home. She was given prescriptions for Percocet to use for pain control. She has an appointment to follow-up with her PCP tomorrow at 2:00 PM. Return precautions were given.  Final Clinical Impressions(s) / ED Diagnoses   Final diagnoses:  Acute pain of right knee  Popliteal cyst, unruptured, right    New Prescriptions New Prescriptions   OXYCODONE-ACETAMINOPHEN (PERCOCET) 5-325 MG TABLET    Take 1-2 tablets by mouth every 4 (four) hours as needed.   I personally performed the services described in this documentation, which was scribed in my presence.  The recorded information has been reviewed and considered.     Bethany Johns, MD 12/24/16 2106

## 2016-12-25 ENCOUNTER — Ambulatory Visit (INDEPENDENT_AMBULATORY_CARE_PROVIDER_SITE_OTHER): Payer: Medicare Other | Admitting: Family Medicine

## 2016-12-25 ENCOUNTER — Encounter: Payer: Self-pay | Admitting: Family Medicine

## 2016-12-25 VITALS — BP 110/50 | HR 55 | Temp 98.1°F | Resp 16 | Ht 62.0 in | Wt 211.6 lb

## 2016-12-25 DIAGNOSIS — M25561 Pain in right knee: Secondary | ICD-10-CM

## 2016-12-25 NOTE — Progress Notes (Signed)
Patient ID: Bethany Miller, female   DOB: [birth year], 71 y.o.   MRN: [id number]    Subjective:  I acted as a Education administrator for Dr. Carollee Herter.  Guerry Bruin, [place]   Patient ID: Bethany Miller, female    DOB: [birth year], 71 y.o.   MRN: [id number]  Chief Complaint  Patient presents with  . Knee Pain    HPI  Patient is in today for follow up right knee.  She was seen in ER yesterday.  She can hardly walk.   Patient Care Team: Carollee Herter, Alferd Apa, DO as PCP - General   Past Medical History:  Diagnosis Date  . Diabetes mellitus   . Hyperlipidemia   . Hypertension   . Murmur     Past Surgical History:  Procedure Laterality Date  . TOTAL KNEE ARTHROPLASTY      Family History  Problem Relation Age of Onset  . Throat cancer Brother   . Heart disease Brother        MI  . Cancer Brother 80       throat,   . Hyperlipidemia Brother   . Cirrhosis Father   . Alcohol abuse Father   . Heart disease Brother   . Cancer Brother   . Arthritis Mother   . Hypertension Sister   . Arthritis Sister   . Cancer Brother   . Cancer Brother 4       melanoma  . Hypertension Brother   . Hyperlipidemia Brother   . Melanoma Unknown   . Arthritis Unknown     Social History   Social History  . Marital status: Married    Spouse name: N/A  . Number of children: N/A  . Years of education: N/A   Occupational History  . Not on file.   Social History Main Topics  . Smoking status: Never Smoker  . Smokeless tobacco: Never Used  . Alcohol use Yes  . Drug use: No  . Sexual activity: Yes    Partners: Male   Other Topics Concern  . 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 albuterol (PROVENTIL HFA;VENTOLIN HFA) 108 (90 Base) MCG/ACT inhaler Inhale 2 puffs into the lungs every 6 (six) hours as needed for wheezing or shortness of breath. 1 Inhaler  0  . Alogliptin Benzoate 25 MG TABS Take by mouth 25 mg daily before breakfast 30 tablet 11  . atorvastatin (LIPITOR) 40 MG tablet TAKE 1 TABLET EVERY DAY 90 tablet 1  . citalopram (CELEXA) 40 MG tablet TAKE 1 TABLET (40 MG TOTAL) BY MOUTH DAILY. 90 tablet 0  . ezetimibe (ZETIA) 10 MG tablet Take 1 tablet (10 mg total) by mouth daily. 90 tablet 1  . fenofibrate 160 MG tablet Take 1 tablet (160 mg total) by mouth daily. 30 tablet 2  . FreeStyle Unistick II Lancets MISC by Does not apply route.      Marland Kitchen glipiZIDE (GLUCOTROL) 5 MG tablet Take 1 tablet (5 mg total) by mouth 2 (two) times daily before a meal. 30 tablet 1  . Glucosamine-Chondroitin (MOVE FREE PO) Take by mouth.    Marland Kitchen glucose blood (FREESTYLE INSULINX TEST) test strip Check blood sugar once a day 300 each 3  . linagliptin (TRADJENTA) 5 MG TABS tablet Take 1 tablet (5 mg total) by mouth daily. 90 tablet 0  .  lisinopril-hydrochlorothiazide (PRINZIDE,ZESTORETIC) 10-12.5 MG tablet TAKE 1 TABLET BY MOUTH EVERY DAY 90 tablet 1  . Loratadine-Pseudoephedrine (CLARITIN-D 12 HOUR PO) Take by mouth.    . Melatonin 5 MG TABS Take 1 tablet by mouth daily.     . metFORMIN (GLUCOPHAGE-XR) 500 MG 24 hr tablet Take 2 tablets (1,000 mg total) by mouth 2 (two) times daily after a meal. 360 tablet 3  . Multiple Vitamins-Minerals (MULTIVITAMIN ADULT PO) Take by mouth. Diabetes Daily Pack    . omeprazole (PRILOSEC) 20 MG capsule Take 1 capsule (20 mg total) by mouth daily. 90 capsule 1  . oxyCODONE-acetaminophen (PERCOCET) 5-325 MG tablet Take 1-2 tablets by mouth every 4 (four) hours as needed. 15 tablet 0   No facility-administered medications prior to visit.     No Known Allergies  Review of Systems  Constitutional: Negative for fever and malaise/fatigue.  HENT: Negative for congestion.   Eyes: Negative for blurred vision.  Respiratory: Negative for cough and shortness of breath.   Cardiovascular: Negative for chest pain, palpitations and leg  swelling.  Gastrointestinal: Negative for vomiting.  Musculoskeletal: Negative for back pain.       Right knee pain.  Skin: Negative for rash.  Neurological: Negative for loss of consciousness and headaches.       Objective:    Physical Exam  Constitutional: She is oriented to person, place, and time. She appears well-developed and well-nourished.  HENT:  Head: Normocephalic and atraumatic.  Eyes: Conjunctivae and EOM are normal.  Neck: Normal range of motion. Neck supple. No JVD present. Carotid bruit is not present. No thyromegaly present.  Cardiovascular: Normal rate, regular rhythm and normal heart sounds.   No murmur heard. Pulmonary/Chest: Effort normal and breath sounds normal. No respiratory distress. She has no wheezes. She has no rales. She exhibits no tenderness.  Musculoskeletal: She exhibits edema and tenderness.       Right knee: She exhibits decreased range of motion and swelling. Tenderness found. Medial joint line and LCL tenderness noted.  Neurological: She is alert and oriented to person, place, and time.  Psychiatric: She has a normal mood and affect.    BP (!) 110/50 (BP Location: Right Arm, Cuff Size: Large)   Pulse (!) 55   Temp 98.1 F (36.7 C) (Oral)   Resp 16   Ht 5\' 2"  (1.575 m)   Wt 211 lb 9.6 oz (96 kg)   SpO2 94%   BMI 38.70 kg/m  Wt Readings from Last 3 Encounters:  12/25/16 211 lb 9.6 oz (96 kg)  12/24/16 207 lb (93.9 kg)  11/25/16 209 lb (94.8 kg)   BP Readings from Last 3 Encounters:  12/25/16 (!) 110/50  12/24/16 (!) 111/57  11/25/16 120/64     Immunization History  Administered Date(s) Administered  . Influenza Split 05/20/2011, 03/24/2012  . Influenza Whole 04/16/2009, 04/30/2010  . Influenza, High Dose Seasonal PF 03/28/2016  . Influenza,inj,Quad PF,36+ Mos 04/08/2013  . Pneumococcal Conjugate-13 03/28/2016  . Td 11/26/2009  . Zoster 01/02/2010    Health Maintenance  Topic Date Due  . MAMMOGRAM  10/27/2012  .  OPHTHALMOLOGY EXAM  11/26/2013  . DEXA SCAN  03/28/2017 (Originally 08/21/2012)  . INFLUENZA VACCINE  02/11/2017  . PNA vac Low Risk Adult (2 of 2 - PPSV23) 03/28/2017  . HEMOGLOBIN A1C  05/14/2017  . FOOT EXAM  08/26/2017  . COLONOSCOPY  06/26/2018  . TETANUS/TDAP  11/27/2019  . Hepatitis C Screening  Completed    Lab Results  Component Value Date   WBC 6.3 11/11/2016   HGB 12.2 11/11/2016   HCT 36.6 11/11/2016   PLT 230.0 11/11/2016   GLUCOSE 183 (H) 11/11/2016   CHOL 155 11/11/2016   TRIG 170.0 (H) 11/11/2016   HDL 41.50 11/11/2016   LDLDIRECT 149.6 01/26/2013   LDLCALC 80 11/11/2016   ALT 11 11/11/2016   AST 12 11/11/2016   NA 136 11/11/2016   K 4.4 11/11/2016   CL 104 11/11/2016   CREATININE 1.10 11/11/2016   BUN 23 11/11/2016   CO2 27 11/11/2016   TSH 1.56 03/18/2011   HGBA1C 8.6 (H) 11/11/2016   MICROALBUR 0.6 03/28/2016    Lab Results  Component Value Date   TSH 1.56 03/18/2011   Lab Results  Component Value Date   WBC 6.3 11/11/2016   HGB 12.2 11/11/2016   HCT 36.6 11/11/2016   MCV 88.1 11/11/2016   PLT 230.0 11/11/2016   Lab Results  Component Value Date   NA 136 11/11/2016   K 4.4 11/11/2016   CO2 27 11/11/2016   GLUCOSE 183 (H) 11/11/2016   BUN 23 11/11/2016   CREATININE 1.10 11/11/2016   BILITOT 0.5 11/11/2016   ALKPHOS 85 11/11/2016   AST 12 11/11/2016   ALT 11 11/11/2016   PROT 6.5 11/11/2016   ALBUMIN 3.8 11/11/2016   CALCIUM 9.4 11/11/2016   GFR 52.31 (L) 11/11/2016   Lab Results  Component Value Date   CHOL 155 11/11/2016   Lab Results  Component Value Date   HDL 41.50 11/11/2016   Lab Results  Component Value Date   LDLCALC 80 11/11/2016   Lab Results  Component Value Date   TRIG 170.0 (H) 11/11/2016   Lab Results  Component Value Date   CHOLHDL 4 11/11/2016   Lab Results  Component Value Date   HGBA1C 8.6 (H) 11/11/2016         Assessment & Plan:   Problem List Items Addressed This Visit    None      Visit Diagnoses    Right knee pain, unspecified chronicity    -  Primary   Relevant Orders   Ambulatory referral to Orthopedic Surgery      eR visit reviewed  I am having Ms. Antuna maintain her FREESTYLE UNISTICK II LANCETS, glucose blood, Melatonin, Multiple Vitamins-Minerals (MULTIVITAMIN ADULT PO), Glucosamine-Chondroitin (MOVE FREE PO), fenofibrate, ezetimibe, lisinopril-hydrochlorothiazide, metFORMIN, glipiZIDE, atorvastatin, citalopram, Loratadine-Pseudoephedrine (CLARITIN-D 12 HOUR PO), omeprazole, albuterol, Alogliptin Benzoate, linagliptin, and oxyCODONE-acetaminophen.  No orders of the defined types were placed in this encounter.   CMA served as Education administrator during this visit. History, Physical and Plan performed by medical provider. Documentation and orders reviewed and attested to.  Ann Held, DO

## 2016-12-25 NOTE — Patient Instructions (Signed)
Knee Pain, Adult Many things can cause knee pain. The pain often goes away on its own with time and rest. If the pain does not go away, tests may be done to find out what is causing the pain. Follow these instructions at home: Activity  Rest your knee.  Do not do things that cause pain.  Avoid activities where both feet leave the ground at the same time (high-impact activities). Examples are running, jumping rope, and doing jumping jacks. General instructions  Take medicines only as told by your doctor.  Raise (elevate) your knee when you are resting. Make sure your knee is higher than your heart.  Sleep with a pillow under your knee.  If told, put ice on the knee: ? Put ice in a plastic bag. ? Place a towel between your skin and the bag. ? Leave the ice on for 20 minutes, 2-3 times a day.  Ask your doctor if you should wear an elastic knee support.  Lose weight if you are overweight. Being overweight can make your knee hurt more.  Do not use any tobacco products. These include cigarettes, chewing tobacco, or electronic cigarettes. If you need help quitting, ask your doctor. Smoking may slow down healing. Contact a doctor if:  The pain does not stop.  The pain changes or gets worse.  You have a fever along with knee pain.  Your knee gives out or locks up.  Your knee swells, and becomes worse. Get help right away if:  Your knee feels warm.  You cannot move your knee.  You have very bad knee pain.  You have chest pain.  You have trouble breathing. Summary  Many things can cause knee pain. The pain often goes away on its own with time and rest.  Avoid activities that put stress on your knee. These include running and jumping rope.  Get help right away if you cannot move your knee, or if your knee feels warm, or if you have trouble breathing. This information is not intended to replace advice given to you by your health care provider. Make sure you discuss any  questions you have with your health care provider. Document Released: 09/26/2008 Document Revised: 06/24/2016 Document Reviewed: 06/24/2016 Elsevier Interactive Patient Education  2017 [employer]   Sliding scale insulin  200-250 2 u  251-300 4u 301-350  6u 351-400  8 u  > 400  10 u and call dr on call

## 2016-12-30 DIAGNOSIS — M1711 Unilateral primary osteoarthritis, right knee: Secondary | ICD-10-CM | POA: Diagnosis not present

## 2017-01-06 ENCOUNTER — Telehealth: Payer: Self-pay | Admitting: Family Medicine

## 2017-01-06 DIAGNOSIS — M1711 Unilateral primary osteoarthritis, right knee: Secondary | ICD-10-CM | POA: Diagnosis not present

## 2017-01-06 NOTE — Telephone Encounter (Signed)
Pt dropped off a form for dr. Etter Sjogren to complete for surgery with  ortho, documents placed in tray at front desk

## 2017-01-07 NOTE — Telephone Encounter (Signed)
Paperwork received, completed patient and provider information; will forward to provider after getting Medical/Surgical Clearance appointment scheduled/SLS 06/27  Please call patient and schedule a med/Surgical Clearance appointment with Dr. Etter Sjogren for Total Knee Arthroplasty with [place] [Dr. Tonita Cong. Thanks/SLS 06/26

## 2017-01-07 NOTE — Telephone Encounter (Signed)
lvm advising patient of message below °

## 2017-01-08 ENCOUNTER — Telehealth: Payer: Self-pay | Admitting: Family Medicine

## 2017-01-08 NOTE — Telephone Encounter (Signed)
Pt has been scheduled.  °

## 2017-01-08 NOTE — Telephone Encounter (Signed)
Okay to schedule

## 2017-01-08 NOTE — Telephone Encounter (Signed)
Pt called in because she said that she is having surgery on her knee. Pt says that she would like to have surgical clearance visit scheduled as soon as possible. Please advise. Is it okay to use two slots to schedule pt appt for sooner ov?

## 2017-01-09 ENCOUNTER — Encounter: Payer: Self-pay | Admitting: Family Medicine

## 2017-01-09 ENCOUNTER — Ambulatory Visit (INDEPENDENT_AMBULATORY_CARE_PROVIDER_SITE_OTHER): Payer: Medicare Other | Admitting: Family Medicine

## 2017-01-09 VITALS — BP 120/60 | HR 65 | Temp 97.8°F | Resp 16 | Ht 62.0 in | Wt 209.0 lb

## 2017-01-09 DIAGNOSIS — I10 Essential (primary) hypertension: Secondary | ICD-10-CM | POA: Diagnosis not present

## 2017-01-09 DIAGNOSIS — Z01818 Encounter for other preprocedural examination: Secondary | ICD-10-CM

## 2017-01-09 DIAGNOSIS — F32A Depression, unspecified: Secondary | ICD-10-CM

## 2017-01-09 DIAGNOSIS — F329 Major depressive disorder, single episode, unspecified: Secondary | ICD-10-CM | POA: Diagnosis not present

## 2017-01-09 MED ORDER — CITALOPRAM HYDROBROMIDE 40 MG PO TABS: ORAL_TABLET | ORAL | 1 refills | Status: DC

## 2017-01-09 MED ORDER — LISINOPRIL-HYDROCHLOROTHIAZIDE 10-12.5 MG PO TABS: ORAL_TABLET | ORAL | 1 refills | Status: DC

## 2017-01-09 NOTE — Patient Instructions (Signed)
Pt is cleared for knee replacement

## 2017-01-09 NOTE — Progress Notes (Signed)
Subjective:    Bethany Miller is a 71 y.o. female who presents to the office today for a preoperative consultation at the request of surgeon Dr Tonita Cong who plans on performing R TKA ---date TBA.. This consultation is requested for the specific conditions prompting preoperative evaluation (i.e. because of potential affect on operative risk): DM II, HTN, hyperlipidemia. . Planned anesthesia: general. The patient has the following known anesthesia issues: no problems. Patients bleeding risk: no recent abnormal bleeding. Patient does not have objections to receiving blood products if needed.  The following portions of the patient's history were reviewed and updated as appropriate: She  has a past medical history of Diabetes mellitus; Hyperlipidemia; Hypertension; and Murmur. She  does not have any pertinent problems on file. She  has a past surgical history that includes Total knee arthroplasty. Her family history includes Alcohol abuse in her father; Arthritis in her mother and sister; Cancer in her brother and brother; Cancer (age of onset: 49) in her brother; Cancer (age of onset: 11) in her brother; Cirrhosis in her father; Heart disease in her brother and brother; Hyperlipidemia in her brother and brother; Hypertension in her brother and sister; Throat cancer in her brother. She  reports that she has never smoked. She has never used smokeless tobacco. She reports that she drinks alcohol. She reports that she does not use drugs. She has a current medication list which includes the following prescription(s): albuterol, alogliptin benzoate, atorvastatin, citalopram, ezetimibe, fenofibrate, freestyle unistick ii lancets, glipizide, glucosamine-chondroitin, glucose blood, hydrocodone-acetaminophen, linagliptin, lisinopril-hydrochlorothiazide, loratadine-pseudoephedrine, melatonin, metformin, multiple vitamins-minerals, omeprazole, and oxycodone-acetaminophen. Current Outpatient Prescriptions on File Prior to  Visit  Medication Sig Dispense Refill  . albuterol (PROVENTIL HFA;VENTOLIN HFA) 108 (90 Base) MCG/ACT inhaler Inhale 2 puffs into the lungs every 6 (six) hours as needed for wheezing or shortness of breath. 1 Inhaler 0  . Alogliptin Benzoate 25 MG TABS Take by mouth 25 mg daily before breakfast 30 tablet 11  . atorvastatin (LIPITOR) 40 MG tablet TAKE 1 TABLET EVERY DAY 90 tablet 1  . ezetimibe (ZETIA) 10 MG tablet Take 1 tablet (10 mg total) by mouth daily. 90 tablet 1  . fenofibrate 160 MG tablet Take 1 tablet (160 mg total) by mouth daily. 30 tablet 2  . FreeStyle Unistick II Lancets MISC by Does not apply route.      Marland Kitchen glipiZIDE (GLUCOTROL) 5 MG tablet Take 1 tablet (5 mg total) by mouth 2 (two) times daily before a meal. 30 tablet 1  . Glucosamine-Chondroitin (MOVE FREE PO) Take by mouth.    Marland Kitchen glucose blood (FREESTYLE INSULINX TEST) test strip Check blood sugar once a day 300 each 3  . linagliptin (TRADJENTA) 5 MG TABS tablet Take 1 tablet (5 mg total) by mouth daily. 90 tablet 0  . Loratadine-Pseudoephedrine (CLARITIN-D 12 HOUR PO) Take by mouth.    . Melatonin 5 MG TABS Take 1 tablet by mouth daily.     . metFORMIN (GLUCOPHAGE-XR) 500 MG 24 hr tablet Take 2 tablets (1,000 mg total) by mouth 2 (two) times daily after a meal. 360 tablet 3  . Multiple Vitamins-Minerals (MULTIVITAMIN ADULT PO) Take by mouth. Diabetes Daily Pack    . omeprazole (PRILOSEC) 20 MG capsule Take 1 capsule (20 mg total) by mouth daily. 90 capsule 1  . oxyCODONE-acetaminophen (PERCOCET) 5-325 MG tablet Take 1-2 tablets by mouth every 4 (four) hours as needed. 15 tablet 0   No current facility-administered medications on file prior to visit.  She has No Known Allergies..  Review of Systems Review of Systems  Constitutional: Negative for activity change, appetite change and fatigue.  HENT: Negative for hearing loss, congestion, tinnitus and ear discharge.  dentist q9m Eyes: Negative for visual disturbance (see  optho q1y -- vision corrected to 20/20 with glasses).  Respiratory: Negative for cough, chest tightness and shortness of breath.   Cardiovascular: Negative for chest pain, palpitations and leg swelling.  Gastrointestinal: Negative for abdominal pain, diarrhea, constipation and abdominal distention.  Genitourinary: Negative for urgency, frequency, decreased urine volume and difficulty urinating.  Musculoskeletal: Negative for back pain, arthralgias and gait problem.  Skin: Negative for color change, pallor and rash.  Neurological: Negative for dizziness, light-headedness, numbness and headaches.  Hematological: Negative for adenopathy. Does not bruise/bleed easily.  Psychiatric/Behavioral: Negative for suicidal ideas, confusion, sleep disturbance, self-injury, dysphoric mood, decreased concentration and agitation.        Objective:    BP 120/60 (BP Location: Right Arm, Cuff Size: Normal)   Pulse 65   Temp 97.8 F (36.6 C) (Oral)   Resp 16   Ht 5\' 2"  (1.575 m)   Wt 209 lb (94.8 kg)   SpO2 96%   BMI 38.23 kg/m  General appearance: alert, cooperative, appears stated age and no distress Head: Normocephalic, without obvious abnormality, atraumatic Eyes: conjunctivae/corneas clear. PERRL, EOM's intact. Fundi benign. Ears: normal TM's and external ear canals both ears Nose: Nares normal. Septum midline. Mucosa normal. No drainage or sinus tenderness. Throat: lips, mucosa, and tongue normal; teeth and gums normal Neck: no adenopathy, no carotid bruit, no JVD, supple, symmetrical, trachea midline and thyroid not enlarged, symmetric, no tenderness/mass/nodules Back: symmetric, no curvature. ROM normal. No CVA tenderness. Lungs: clear to auscultation bilaterally Heart: regular rate and rhythm, S1, S2 normal, no murmur, click, rub or gallop Abdomen: soft, non-tender; bowel sounds normal; no masses,  no organomegaly Extremities: extremities normal, atraumatic, no cyanosis or edema Pulses: 2+  and symmetric Skin: Skin color, texture, turgor normal. No rashes or lesions Lymph nodes: Cervical, supraclavicular, and axillary nodes normal. Neurologic: Alert and oriented X 3, normal strength and tone. Normal symmetric reflexes. Normal coordination and gait  Cardiographics ECG: normal sinus rhythm, no blocks or conduction defects, no ischemic changes Echocardiogram: not done  Imaging Chest x-ray:  11/28/2016  normal  Lab Review  Office Visit on 11/11/2016  Component Date Value  . Cholesterol 11/11/2016 155   . Triglycerides 11/11/2016 170.0*  . HDL 11/11/2016 41.50   . VLDL 11/11/2016 34.0   . LDL Cholesterol 11/11/2016 80   . Total CHOL/HDL Ratio 11/11/2016 4   . NonHDL 11/11/2016 113.57   . Hgb A1c MFr Bld 11/11/2016 8.6*  . Sodium 11/11/2016 136   . Potassium 11/11/2016 4.4   . Chloride 11/11/2016 104   . CO2 11/11/2016 27   . Glucose, Bld 11/11/2016 183*  . BUN 11/11/2016 23   . Creatinine, Ser 11/11/2016 1.10   . Total Bilirubin 11/11/2016 0.5   . Alkaline Phosphatase 11/11/2016 85   . AST 11/11/2016 12   . ALT 11/11/2016 11   . Total Protein 11/11/2016 6.5   . Albumin 11/11/2016 3.8   . Calcium 11/11/2016 9.4   . GFR 11/11/2016 52.31*  . WBC 11/11/2016 6.3   . RBC 11/11/2016 4.16   . Hemoglobin 11/11/2016 12.2   . HCT 11/11/2016 36.6   . MCV 11/11/2016 88.1   . MCHC 11/11/2016 33.3   . RDW 11/11/2016 13.7   . Platelets 11/11/2016 230.0   .  Neutrophils Relative % 11/11/2016 66.0   . Lymphocytes Relative 11/11/2016 25.2   . Monocytes Relative 11/11/2016 6.4   . Eosinophils Relative 11/11/2016 1.9   . Basophils Relative 11/11/2016 0.5   . Neutro Abs 11/11/2016 4.2   . Lymphs Abs 11/11/2016 1.6   . Monocytes Absolute 11/11/2016 0.4   . Eosinophils Absolute 11/11/2016 0.1   . Basophils Absolute 11/11/2016 0.0       Assessment:      71 y.o. female with planned surgery as above.   Known risk factors for perioperative complications: Diabetes mellitus    Difficulty with intubation is not anticipated.  Cardiac Risk Estimation: low      Plan:    1. Preoperative workup as follows ECG, hemoglobin, hematocrit, electrolytes, creatinine, glucose, liver function studies, coagulation studies, urinalysis (urinary tract instrumentation planned). 2. Prophylaxis for cardiac events with perioperative beta-blockers: not indicated. 3 Deep vein thrombosis prophylaxis postoperatively:regimen to be chosen by surgical team.

## 2017-01-09 NOTE — Assessment & Plan Note (Signed)
Cleared for knee replacement  Pain meds per ortho rto after surgery

## 2017-01-19 ENCOUNTER — Ambulatory Visit: Payer: Self-pay | Admitting: Specialist

## 2017-01-29 DIAGNOSIS — M1711 Unilateral primary osteoarthritis, right knee: Secondary | ICD-10-CM | POA: Diagnosis not present

## 2017-02-03 ENCOUNTER — Ambulatory Visit: Payer: Self-pay | Admitting: Orthopedic Surgery

## 2017-02-03 DIAGNOSIS — M1711 Unilateral primary osteoarthritis, right knee: Secondary | ICD-10-CM | POA: Diagnosis not present

## 2017-02-03 NOTE — H&P (Signed)
Bethany Miller DOB: [birth year] Widowed / Language: English / Race: White Female  H&P Date: 02/03/17  Chief Complaint: Right knee pain  History of Present Illness  The patient is a 71 year old female who comes in today for a preoperative History and Physical. The patient is scheduled for a right total knee arthroplasty to be performed by Dr. Johnn Hai, MD at [facility] on 02/26/17. Bethany presents today for preoperative H&P prior to right total knee replacement. She reports ongoing symptoms progressively worsening refractory to conservative treatment including injection therapy, bracing, activity modifications, quadriceps strengthening, weight loss, medication, and relative rest. She is taking narcotic pain medication for this currently. She does feel that the pain is interfering with her quality of life and activities of daily living at this point, and desires to proceed with surgery. She total knee replacement in 2010 which she had revised in 2016 due to loosening which was not traumatic in nature. She has done well with her left knee following the revision. She does report she gets very anxious around the time of surgery. She has no history of MRSA exposure or DVT. She tolerates aspirin well. She had general anesthesia for her knee replacement previously and did well with that, she denies any history of postop nausea and vomiting. she lives with her son who will be able to be with her at night while he is not working, and she plans to go directly to outpatient PT following hospital stay. She has a few steps to enter the home but it is one level.  Dr. Tonita Cong and the patient mutually agreed to proceed with a total knee replacement. Risks and benefits of the procedure were discussed including stiffness, suboptimal range of motion, persistent pain, infection requiring removal of prosthesis and reinsertion, need for prophylactic antibiotics in the future, for example, dental  procedures, possible need for manipulation, revision in the future and also anesthetic complications including DVT, PE, etc. We discussed the perioperative course, time in the hospital, postoperative recovery and the need for elevation to control swelling. We also discussed the predicted range of motion and the probability that squatting and kneeling would be unobtainable in the future. In addition, postoperative anticoagulation was discussed. We have obtained preoperative medical clearance as necessary. Provided illustrated handout and discussed it in detail. They will enroll in the total joint replacement educational forum at the hospital.  Problem List/Past Medical Hx Primary osteoarthritis of right knee (M17.11)  Diabetes Mellitus, Type II  High blood pressure  Hypercholesterolemia   Allergies No Known Drug Allergies [12/30/2016]:  Family History Family history unknown - Adopted  First Degree Relatives  reported  Social History Tobacco use  Never smoker. 12/30/2016 Children  2 Current work status  retired Furniture conservator/restorer weekly; does running / walking and individual sport Living situation  live with son, one level home with few steps to enter Marital status  widowed Never consumed alcohol  12/30/2016: Never consumed alcohol No history of drug/alcohol rehab  Not under pain contract  Number of flights of stairs before winded  2-3 Tobacco / smoke exposure  12/30/2016: no Post-Surgical Plans  home, outpt PT  Medication History Atorvastatin Calcium (40MG  Tablet, Oral) Active. GlipiZIDE (5MG  Tablet, Oral) Active. Lisinopril-Hydrochlorothiazide (10-12.5MG  Tablet, Oral) Active. MetFORMIN HCl ER (500MG  Tablet ER 24HR, Oral) Active. Omeprazole (20MG  Capsule DR, Oral) Active. Oxycodone-Acetaminophen (5-325MG  Tablet, Oral) Active. Medications Reconciled  Pregnancy / Birth History Pregnant  no  Past Surgical History Arthroscopy of Knee  left Foot  Surgery  right Gallbladder Surgery  laporoscopic Hysterectomy  partial (non-cancerous) Spinal Surgery  Tonsillectomy  Total Knee Replacement  left Tubal Ligation   Physical Exam General Mental Status -Alert, cooperative and good historian. General Appearance-pleasant, Not in acute distress. Orientation-Oriented X3. Build & Nutrition-Well nourished and Well developed.  Head and Neck Head-normocephalic, atraumatic . Neck Global Assessment - supple, no bruit auscultated on the right, no bruit auscultated on the left.  Eye Pupil - Bilateral-Regular and Round. Motion - Bilateral-EOMI.  Chest and Lung Exam Auscultation Breath sounds - clear at anterior chest wall and clear at posterior chest wall. Adventitious sounds - No Adventitious sounds.  Cardiovascular Auscultation Rhythm - Regular rate and rhythm. Heart Sounds - S1 WNL and S2 WNL. Murmurs & Other Heart Sounds - Auscultation of the heart reveals - No Murmurs.  Abdomen Palpation/Percussion Tenderness - Abdomen is non-tender to palpation. Rigidity (guarding) - Abdomen is soft. Auscultation Auscultation of the abdomen reveals - Bowel sounds normal.  Female Genitourinary Not done, not pertinent to present illness  Musculoskeletal On exam, I see a healthy female. Walks with an antalgic gait. Tender medial joint line. Patellofemoral pain on compression. Mild effusion. Knee exam on inspection reveals no evidence of soft tissue swelling, ecchymosis, deformity or erythema. On palpation there is no tenderness in the medial and lateral joint line. No patellofemoral pain with compression. Nontender over the fibular head or the peroneal nerve. Nontender over the quadriceps insertion of the patellar ligament insertion. The range of motion was full. Provocative maneuvers revealed a negative Lachman, negative anterior and posterior drawer and a negative McMurray. No instability was noted with varus and valgus stressing  at 0 or 30 degrees. On manual motor test the quadriceps and hamstrings were 5/5. Sensory exam was intact to light touch.  She lacks 5 degrees of extension.  Imaging AP standing and lateral demonstrate tricompartmental osteoarthrosis of the knee.  Assessment & Plan Primary osteoarthritis of right knee (M17.11)  Pt with end-stage right knee DJD, bone-on-bone, refractory to conservative tx, scheduled for right total knee replacement by Dr. Tonita Cong on August 16. We again discussed the procedure itself as well as risks, complications and alternatives, including but not limited to DVT, PE, infx, bleeding, failure of procedure, need for secondary procedure including manipulation, nerve injury, ongoing pain/symptoms, anesthesia risk, even stroke or death. Also discussed typical post-op protocols, activity restrictions, need for PT, flexion/extension exercises, time out of work. Discussed need for DVT ppx post-op per protocol. Discussed dental ppx and infx prevention. Also discussed limitations post-operatively such as kneeling and squatting. All questions were answered. Patient desires to proceed with surgery as scheduled.  Will hold supplements, ASA and NSAIDs accordingly. Will remain NPO after midnight the night before surgery. Will present to [facility] for pre-op testing. Anticipate hospital stay to include at least 2 midnights given medical history and to ensure proper pain control. Plan ASA 325mg  BID for DVT ppx post-op. Plan Percocet, Robaxin, Colace, Miralax. Plan outpatient PT, son at home to help with recovery. Will follow up 10-14 days post-op for suture removal and xrays.  Plan right total knee replacement  Signed electronically by Cecilie Kicks, PA-C for Dr. Tonita Cong

## 2017-02-12 NOTE — Patient Instructions (Addendum)
Bethany Miller  02/12/2017   Your procedure is scheduled on: 02-26-17   Report to [facility] Main  Entrance Take [place] Elevators to 3rd floor to  [place] at 5:30 AM.   Call this number if you have problems the morning of surgery [phone]    Remember: ONLY 1 PERSON MAY GO WITH YOU TO SHORT STAY TO GET  READY MORNING OF [place].  Do not eat food or drink liquids :After Midnight.     Take these medicines the morning of surgery with A SIP OF WATER: Atorvastatin (Lipitor), Citalopram (Celexa), Omeprazole (Prilosec). You may also  bring and use any inhaler that you might need  DO NOT TAKE ANY DIABETIC MEDICATIONS DAY OF YOUR SURGERY                               You may not have any metal on your body including hair pins and              piercings  Do not wear jewelry, make-up, lotions, powders or perfumes, deodorant             Do not wear nail polish.  Do not shave  48 hours prior to surgery.                Do not bring valuables to the hospital. [name].  Contacts, dentures or bridgework may not be worn into surgery.  Leave suitcase in the car. After surgery it may be brought to your room.     Please read over the following fact sheets you were given: _____________________________________________________________________             How to Manage Your Diabetes Before and After Surgery  Why is it important to control my blood sugar before and after surgery? . Improving blood sugar levels before and after surgery helps healing and can limit problems. . A way of improving blood sugar control is eating a healthy diet by: o  Eating less sugar and carbohydrates o  Increasing activity/exercise o  Talking with your doctor about reaching your blood sugar goals . High blood sugars (greater than 180 mg/dL) can raise your risk of infections and slow your recovery, so you will need to focus on  controlling your diabetes during the weeks before surgery. . Make sure that the doctor who takes care of your diabetes knows about your planned surgery including the date and location.  How do I manage my blood sugar before surgery? . Check your blood sugar at least 4 times a day, starting 2 days before surgery, to make sure that the level is not too high or low. o Check your blood sugar the morning of your surgery when you wake up and every 2 hours until you get to the Short Stay unit. . If your blood sugar is less than 70 mg/dL, you will need to treat for low blood sugar: o Do not take insulin. o Treat a low blood sugar (less than 70 mg/dL) with  cup of clear juice (cranberry or apple), 4 glucose tablets, OR glucose gel. o Recheck blood sugar in 15 minutes after treatment (to make sure it is greater than 70 mg/dL). If your blood sugar is  not greater than 70 mg/dL on recheck, call [phone] for further instructions. . Report your blood sugar to the short stay nurse when you get to Short Stay.  . If you are admitted to the hospital after surgery: o Your blood sugar will be checked by the staff and you will probably be given insulin after surgery (instead of oral diabetes medicines) to make sure you have good blood sugar levels. o The goal for blood sugar control after surgery is 80-180 mg/dL.   WHAT DO I DO ABOUT MY DIABETES MEDICATION?  Marland Kitchen Do not take oral diabetes medicines (pills) the morning of surgery.  . THE DAY BEFORE SURGERY, take your morning and/or lunch dose of Glipizide, and your usual dose of Metformin       Patient Signature:  Date:   Nurse Signature:  Date:   Reviewed and Endorsed by [facility] Patient Education Committee, August 2015  [facility] - Preparing for Surgery Before surgery, you can play an important role.  Because skin is not sterile, your skin needs to be as free of germs as possible.  You can reduce the number of germs on your skin by washing with CHG  (chlorahexidine gluconate) soap before surgery.  CHG is an antiseptic cleaner which kills germs and bonds with the skin to continue killing germs even after washing. Please DO NOT use if you have an allergy to CHG or antibacterial soaps.  If your skin becomes reddened/irritated stop using the CHG and inform your nurse when you arrive at Short Stay. Do not shave (including legs and underarms) for at least 48 hours prior to the first CHG shower.  You may shave your face/neck. Please follow these instructions carefully:  1.  Shower with CHG Soap the night before surgery and the  morning of Surgery.  2.  If you choose to wash your hair, wash your hair first as usual with your  normal  shampoo.  3.  After you shampoo, rinse your hair and body thoroughly to remove the  shampoo.                           4.  Use CHG as you would any other liquid soap.  You can apply chg directly  to the skin and wash                       Gently with a scrungie or clean washcloth.  5.  Apply the CHG Soap to your body ONLY FROM THE NECK DOWN.   Do not use on face/ open                           Wound or open sores. Avoid contact with eyes, ears mouth and genitals (private parts).                       Wash face,  Genitals (private parts) with your normal soap.             6.  Wash thoroughly, paying special attention to the area where your surgery  will be performed.  7.  Thoroughly rinse your body with warm water from the neck down.  8.  DO NOT shower/wash with your normal soap after using and rinsing off  the CHG Soap.                9.  Pat yourself dry with a clean towel.            10.  Wear clean pajamas.            11.  Place clean sheets on your bed the night of your first shower and do not  sleep with pets. Day of Surgery : Do not apply any lotions/deodorants the morning of surgery.  Please wear clean clothes to the hospital/surgery center.  FAILURE TO FOLLOW THESE INSTRUCTIONS MAY RESULT IN THE CANCELLATION OF  YOUR SURGERY PATIENT SIGNATURE_________________________________  NURSE SIGNATURE__________________________________  ________________________________________________________________________   [name]  An incentive spirometer is a tool that can help keep your lungs clear and active. This tool measures how well you are filling your lungs with each breath. Taking long deep breaths may help reverse or decrease the chance of developing breathing (pulmonary) problems (especially infection) following:  A long period of time when you are unable to move or be active. BEFORE THE PROCEDURE   If the spirometer includes an indicator to show your best effort, your nurse or respiratory therapist will set it to a desired goal.  If possible, sit up straight or lean slightly forward. Try not to slouch.  Hold the incentive spirometer in an upright position. INSTRUCTIONS FOR USE  1. Sit on the edge of your bed if possible, or sit up as far as you can in bed or on a chair. 2. Hold the incentive spirometer in an upright position. 3. Breathe out normally. 4. Place the mouthpiece in your mouth and seal your lips tightly around it. 5. Breathe in slowly and as deeply as possible, raising the piston or the ball toward the top of the column. 6. Hold your breath for 3-5 seconds or for as long as possible. Allow the piston or ball to fall to the bottom of the column. 7. Remove the mouthpiece from your mouth and breathe out normally. 8. Rest for a few seconds and repeat Steps 1 through 7 at least 10 times every 1-2 hours when you are awake. Take your time and take a few normal breaths between deep breaths. 9. The spirometer may include an indicator to show your best effort. Use the indicator as a goal to work toward during each repetition. 10. After each set of 10 deep breaths, practice coughing to be sure your lungs are clear. If you have an incision (the cut made at the time of surgery), support your  incision when coughing by placing a pillow or rolled up towels firmly against it. Once you are able to get out of bed, walk around indoors and cough well. You may stop using the incentive spirometer when instructed by your caregiver.  RISKS AND COMPLICATIONS  Take your time so you do not get dizzy or light-headed.  If you are in pain, you may need to take or ask for pain medication before doing incentive spirometry. It is harder to take a deep breath if you are having pain. AFTER USE  Rest and breathe slowly and easily.  It can be helpful to keep track of a log of your progress. Your caregiver can provide you with a simple table to help with this. If you are using the spirometer at home, follow these instructions: [place] IF:   You are having difficultly using the spirometer.  You have trouble using the spirometer as often as instructed.  Your pain medication is not giving enough relief while using the spirometer.  You develop fever  of 100.5 F (38.1 C) or higher. SEEK IMMEDIATE MEDICAL CARE IF:   You cough up bloody sputum that had not been present before.  You develop fever of 102 F (38.9 C) or greater.  You develop worsening pain at or near the incision site. MAKE SURE YOU:   Understand these instructions.  Will watch your condition.  Will get help right away if you are not doing well or get worse. Document Released: 11/10/2006 Document Revised: 09/22/2011 Document Reviewed: 01/11/2007 ExitCare Patient Information 2014 ExitCare, [place].   ________________________________________________________________________  WHAT IS A BLOOD TRANSFUSION? Blood Transfusion Information  A transfusion is the replacement of blood or some of its parts. Blood is made up of multiple cells which provide different functions.  Red blood cells carry oxygen and are used for blood loss replacement.  White blood cells fight against infection.  Platelets control bleeding.  Plasma  helps clot blood.  Other blood products are available for specialized needs, such as hemophilia or other clotting disorders. BEFORE THE TRANSFUSION  Who gives blood for transfusions?   Healthy volunteers who are fully evaluated to make sure their blood is safe. This is blood bank blood. Transfusion therapy is the safest it has ever been in the practice of medicine. Before blood is taken from a donor, a complete history is taken to make sure that person has no history of diseases nor engages in risky social behavior (examples are intravenous drug use or sexual activity with multiple partners). The donor's travel history is screened to minimize risk of transmitting infections, such as malaria. The donated blood is tested for signs of infectious diseases, such as HIV and hepatitis. The blood is then tested to be sure it is compatible with you in order to minimize the chance of a transfusion reaction. If you or a relative donates blood, this is often done in anticipation of surgery and is not appropriate for emergency situations. It takes many days to process the donated blood. RISKS AND COMPLICATIONS Although transfusion therapy is very safe and saves many lives, the main dangers of transfusion include:   Getting an infectious disease.  Developing a transfusion reaction. This is an allergic reaction to something in the blood you were given. Every precaution is taken to prevent this. The decision to have a blood transfusion has been considered carefully by your caregiver before blood is given. Blood is not given unless the benefits outweigh the risks. AFTER THE TRANSFUSION  Right after receiving a blood transfusion, you will usually feel much better and more energetic. This is especially true if your red blood cells have gotten low (anemic). The transfusion raises the level of the red blood cells which carry oxygen, and this usually causes an energy increase.  The nurse administering the transfusion  will monitor you carefully for complications. HOME CARE INSTRUCTIONS  No special instructions are needed after a transfusion. You may find your energy is better. Speak with your caregiver about any limitations on activity for underlying diseases you may have. SEEK MEDICAL CARE IF:   Your condition is not improving after your transfusion.  You develop redness or irritation at the intravenous (IV) site. SEEK IMMEDIATE MEDICAL CARE IF:  Any of the following symptoms occur over the next 12 hours:  Shaking chills.  You have a temperature by mouth above 102 F (38.9 C), not controlled by medicine.  Chest, back, or muscle pain.  People around you feel you are not acting correctly or are confused.  Shortness of breath or  difficulty breathing.  Dizziness and fainting.  You get a rash or develop hives.  You have a decrease in urine output.  Your urine turns a dark color or changes to pink, red, or brown. Any of the following symptoms occur over the next 10 days:  You have a temperature by mouth above 102 F (38.9 C), not controlled by medicine.  Shortness of breath.  Weakness after normal activity.  The white part of the eye turns yellow (jaundice).  You have a decrease in the amount of urine or are urinating less often.  Your urine turns a dark color or changes to pink, red, or brown. Document Released: 06/27/2000 Document Revised: 09/22/2011 Document Reviewed: 02/14/2008 [facility] Patient Information 2014 [place], [place].  _______________________________________________________________________

## 2017-02-12 NOTE — Progress Notes (Signed)
01-09-17 (EPIC) EKG:NSR, Surgical Clearance from Dr. Etter Sjogren 11-11-16 [facility]) CXR: Lungs sounds clear

## 2017-02-17 ENCOUNTER — Encounter (HOSPITAL_COMMUNITY)
Admission: RE | Admit: 2017-02-17 | Discharge: 2017-02-17 | Disposition: A | Discharge status: Home / Self Care | Payer: Medicare Other | Source: Ambulatory Visit | Attending: Specialist | Admitting: Specialist

## 2017-02-17 ENCOUNTER — Encounter (HOSPITAL_COMMUNITY): Payer: Self-pay

## 2017-02-17 DIAGNOSIS — M1711 Unilateral primary osteoarthritis, right knee: Secondary | ICD-10-CM | POA: Diagnosis not present

## 2017-02-17 DIAGNOSIS — Z01812 Encounter for preprocedural laboratory examination: Secondary | ICD-10-CM | POA: Diagnosis not present

## 2017-02-17 DIAGNOSIS — Z0183 Encounter for blood typing: Secondary | ICD-10-CM | POA: Diagnosis not present

## 2017-02-17 LAB — CBC
HEMATOCRIT: 35.2 % — AB (ref 36.0–46.0)
HEMOGLOBIN: 11.7 g/dL — AB (ref 12.0–15.0)
MCH: 28.7 pg (ref 26.0–34.0)
MCHC: 33.2 g/dL (ref 30.0–36.0)
MCV: 86.5 fL (ref 78.0–100.0)
Platelets: 263 10*3/uL (ref 150–400)
RBC: 4.07 MIL/uL (ref 3.87–5.11)
RDW: 13.2 % (ref 11.5–15.5)
WBC: 7.5 10*3/uL (ref 4.0–10.5)

## 2017-02-17 LAB — PROTIME-INR
INR: 0.96
Prothrombin Time: 12.8 seconds (ref 11.4–15.2)

## 2017-02-17 LAB — URINALYSIS, ROUTINE W REFLEX MICROSCOPIC
BILIRUBIN URINE: NEGATIVE
GLUCOSE, UA: NEGATIVE mg/dL
HGB URINE DIPSTICK: NEGATIVE
KETONES UR: NEGATIVE mg/dL
Nitrite: NEGATIVE
PH: 5 (ref 5.0–8.0)
PROTEIN: NEGATIVE mg/dL
Specific Gravity, Urine: 1.021 (ref 1.005–1.030)

## 2017-02-17 LAB — BASIC METABOLIC PANEL
ANION GAP: 9 (ref 5–15)
BUN: 34 mg/dL — AB (ref 6–20)
CHLORIDE: 101 mmol/L (ref 101–111)
CO2: 24 mmol/L (ref 22–32)
Calcium: 9 mg/dL (ref 8.9–10.3)
Creatinine, Ser: 1.49 mg/dL — ABNORMAL HIGH (ref 0.44–1.00)
GFR calc Af Amer: 40 mL/min — ABNORMAL LOW (ref >60)
GFR, EST NON AFRICAN AMERICAN: 35 mL/min — AB (ref >60)
GLUCOSE: 225 mg/dL — AB (ref 65–99)
POTASSIUM: 6.2 mmol/L — AB (ref 3.5–5.1)
Sodium: 134 mmol/L — ABNORMAL LOW (ref 135–145)

## 2017-02-17 LAB — SURGICAL PCR SCREEN
MRSA, PCR: NEGATIVE
STAPHYLOCOCCUS AUREUS: NEGATIVE

## 2017-02-17 LAB — GLUCOSE, CAPILLARY: Glucose-Capillary: 237 mg/dL — ABNORMAL HIGH (ref 65–99)

## 2017-02-17 LAB — APTT: aPTT: 28 seconds (ref 24–36)

## 2017-02-17 LAB — ABO/RH: ABO/RH(D): A POS

## 2017-02-17 NOTE — Progress Notes (Addendum)
02-17-17 BMP, UA, and HGA1C routed to Dr. Tonita Cong for review.

## 2017-02-18 LAB — HEMOGLOBIN A1C
Hgb A1c MFr Bld: 8.5 % — ABNORMAL HIGH (ref 4.8–5.6)
MEAN PLASMA GLUCOSE: 197 mg/dL

## 2017-02-20 ENCOUNTER — Encounter: Payer: Self-pay | Admitting: Internal Medicine

## 2017-02-25 ENCOUNTER — Encounter (HOSPITAL_COMMUNITY): Payer: Self-pay | Admitting: Certified Registered Nurse Anesthetist

## 2017-02-26 ENCOUNTER — Encounter (HOSPITAL_COMMUNITY): Payer: Self-pay | Admitting: *Deleted

## 2017-02-26 ENCOUNTER — Inpatient Hospital Stay (HOSPITAL_COMMUNITY): Payer: Medicare Other | Admitting: Certified Registered Nurse Anesthetist

## 2017-02-26 ENCOUNTER — Inpatient Hospital Stay (HOSPITAL_COMMUNITY): Payer: Medicare Other

## 2017-02-26 ENCOUNTER — Inpatient Hospital Stay (HOSPITAL_COMMUNITY)
Admission: RE | Admit: 2017-02-26 | Discharge: 2017-03-01 | DRG: 470 | Disposition: A | Discharge status: Home / Self Care | Payer: Medicare Other | Source: Ambulatory Visit | Attending: Specialist | Admitting: Specialist

## 2017-02-26 ENCOUNTER — Encounter (HOSPITAL_COMMUNITY)
Admission: RE | Disposition: A | Discharge status: Home / Self Care | Payer: Self-pay | Source: Ambulatory Visit | Attending: Specialist

## 2017-02-26 DIAGNOSIS — G8918 Other acute postprocedural pain: Secondary | ICD-10-CM | POA: Diagnosis not present

## 2017-02-26 DIAGNOSIS — Z9049 Acquired absence of other specified parts of digestive tract: Secondary | ICD-10-CM

## 2017-02-26 DIAGNOSIS — Z90710 Acquired absence of both cervix and uterus: Secondary | ICD-10-CM

## 2017-02-26 DIAGNOSIS — Z96651 Presence of right artificial knee joint: Secondary | ICD-10-CM | POA: Diagnosis not present

## 2017-02-26 DIAGNOSIS — M1711 Unilateral primary osteoarthritis, right knee: Secondary | ICD-10-CM | POA: Diagnosis present

## 2017-02-26 DIAGNOSIS — Z96652 Presence of left artificial knee joint: Secondary | ICD-10-CM | POA: Diagnosis present

## 2017-02-26 DIAGNOSIS — E1151 Type 2 diabetes mellitus with diabetic peripheral angiopathy without gangrene: Secondary | ICD-10-CM | POA: Diagnosis present

## 2017-02-26 DIAGNOSIS — F419 Anxiety disorder, unspecified: Secondary | ICD-10-CM | POA: Diagnosis present

## 2017-02-26 DIAGNOSIS — Z79899 Other long term (current) drug therapy: Secondary | ICD-10-CM | POA: Diagnosis not present

## 2017-02-26 DIAGNOSIS — M25561 Pain in right knee: Secondary | ICD-10-CM | POA: Diagnosis not present

## 2017-02-26 DIAGNOSIS — E7800 Pure hypercholesterolemia, unspecified: Secondary | ICD-10-CM | POA: Diagnosis present

## 2017-02-26 DIAGNOSIS — E785 Hyperlipidemia, unspecified: Secondary | ICD-10-CM | POA: Diagnosis present

## 2017-02-26 DIAGNOSIS — M25761 Osteophyte, right knee: Secondary | ICD-10-CM | POA: Diagnosis present

## 2017-02-26 DIAGNOSIS — Z7984 Long term (current) use of oral hypoglycemic drugs: Secondary | ICD-10-CM

## 2017-02-26 DIAGNOSIS — K219 Gastro-esophageal reflux disease without esophagitis: Secondary | ICD-10-CM | POA: Diagnosis present

## 2017-02-26 DIAGNOSIS — I10 Essential (primary) hypertension: Secondary | ICD-10-CM | POA: Diagnosis present

## 2017-02-26 DIAGNOSIS — Z471 Aftercare following joint replacement surgery: Secondary | ICD-10-CM | POA: Diagnosis not present

## 2017-02-26 DIAGNOSIS — E119 Type 2 diabetes mellitus without complications: Secondary | ICD-10-CM | POA: Diagnosis not present

## 2017-02-26 DIAGNOSIS — Z96659 Presence of unspecified artificial knee joint: Secondary | ICD-10-CM

## 2017-02-26 HISTORY — PX: TOTAL KNEE ARTHROPLASTY: SHX125

## 2017-02-26 LAB — TYPE AND SCREEN
ABO/RH(D): A POS
Antibody Screen: NEGATIVE

## 2017-02-26 LAB — POCT I-STAT 4, (NA,K, GLUC, HGB,HCT)
GLUCOSE: 133 mg/dL — AB (ref 65–99)
HCT: 31 % — ABNORMAL LOW (ref 36.0–46.0)
Hemoglobin: 10.5 g/dL — ABNORMAL LOW (ref 12.0–15.0)
POTASSIUM: 4.2 mmol/L (ref 3.5–5.1)
SODIUM: 138 mmol/L (ref 135–145)

## 2017-02-26 LAB — GLUCOSE, CAPILLARY
GLUCOSE-CAPILLARY: 196 mg/dL — AB (ref 65–99)
Glucose-Capillary: 133 mg/dL — ABNORMAL HIGH (ref 65–99)
Glucose-Capillary: 183 mg/dL — ABNORMAL HIGH (ref 65–99)
Glucose-Capillary: 265 mg/dL — ABNORMAL HIGH (ref 65–99)

## 2017-02-26 SURGERY — ARTHROPLASTY, KNEE, TOTAL
Anesthesia: Monitor Anesthesia Care | Site: Knee | Laterality: Right

## 2017-02-26 MED ORDER — FENOFIBRATE 160 MG PO TABS
160.0000 mg | ORAL_TABLET | Freq: Every day | ORAL | Status: DC
  Administered 2017-02-27 – 2017-03-01 (×3): 160 mg via ORAL
  Filled 2017-02-26 (×2): qty 1

## 2017-02-26 MED ORDER — ALBUTEROL SULFATE (2.5 MG/3ML) 0.083% IN NEBU: 2.5000 mg | INHALATION_SOLUTION | Freq: Four times a day (QID) | RESPIRATORY_TRACT | Status: DC | PRN

## 2017-02-26 MED ORDER — CEFAZOLIN SODIUM-DEXTROSE 2-4 GM/100ML-% IV SOLN
2.0000 g | Freq: Four times a day (QID) | INTRAVENOUS | Status: AC
  Administered 2017-02-26 – 2017-02-27 (×3): 2 g via INTRAVENOUS
  Filled 2017-02-26 (×3): qty 100

## 2017-02-26 MED ORDER — KCL IN DEXTROSE-NACL 20-5-0.45 MEQ/L-%-% IV SOLN
INTRAVENOUS | Status: AC
  Administered 2017-02-26: 13:00:00 via INTRAVENOUS
  Filled 2017-02-26 (×2): qty 1000

## 2017-02-26 MED ORDER — METHOCARBAMOL 1000 MG/10ML IJ SOLN
500.0000 mg | Freq: Four times a day (QID) | INTRAVENOUS | Status: DC | PRN
  Administered 2017-02-26: 500 mg via INTRAVENOUS
  Filled 2017-02-26: qty 550
  Filled 2017-02-26: qty 5

## 2017-02-26 MED ORDER — SODIUM CHLORIDE 0.9 % IV SOLN
INTRAVENOUS | Status: DC | PRN
  Administered 2017-02-26: 500 mL

## 2017-02-26 MED ORDER — LACTATED RINGERS IV SOLN
INTRAVENOUS | Status: DC
  Administered 2017-02-26 (×3): via INTRAVENOUS

## 2017-02-26 MED ORDER — ONDANSETRON HCL 4 MG/2ML IJ SOLN
INTRAMUSCULAR | Status: AC
  Filled 2017-02-26: qty 2

## 2017-02-26 MED ORDER — BUPIVACAINE-EPINEPHRINE 0.25% -1:200000 IJ SOLN
INTRAMUSCULAR | Status: DC | PRN
  Administered 2017-02-26: 60 mL

## 2017-02-26 MED ORDER — BUPIVACAINE-EPINEPHRINE (PF) 0.25% -1:200000 IJ SOLN
INTRAMUSCULAR | Status: AC
  Filled 2017-02-26: qty 30

## 2017-02-26 MED ORDER — CHLORHEXIDINE GLUCONATE 4 % EX LIQD: 60.0000 mL | Freq: Once | CUTANEOUS | Status: DC

## 2017-02-26 MED ORDER — DEXAMETHASONE SODIUM PHOSPHATE 10 MG/ML IJ SOLN
INTRAMUSCULAR | Status: AC
  Filled 2017-02-26: qty 1

## 2017-02-26 MED ORDER — METOCLOPRAMIDE HCL 5 MG PO TABS: 5.0000 mg | ORAL_TABLET | Freq: Three times a day (TID) | ORAL | Status: DC | PRN

## 2017-02-26 MED ORDER — DEXAMETHASONE SODIUM PHOSPHATE 10 MG/ML IJ SOLN
INTRAMUSCULAR | Status: DC | PRN
  Administered 2017-02-26: 5 mg via INTRAVENOUS

## 2017-02-26 MED ORDER — FENTANYL CITRATE (PF) 100 MCG/2ML IJ SOLN
INTRAMUSCULAR | Status: AC
  Filled 2017-02-26: qty 2

## 2017-02-26 MED ORDER — ASPIRIN EC 325 MG PO TBEC
325.0000 mg | DELAYED_RELEASE_TABLET | Freq: Two times a day (BID) | ORAL | Status: DC
  Administered 2017-02-27 – 2017-03-01 (×5): 325 mg via ORAL
  Filled 2017-02-26 (×5): qty 1

## 2017-02-26 MED ORDER — HYDROMORPHONE HCL-NACL 0.5-0.9 MG/ML-% IV SOSY
PREFILLED_SYRINGE | INTRAVENOUS | Status: AC
  Filled 2017-02-26: qty 1

## 2017-02-26 MED ORDER — HYDROMORPHONE HCL-NACL 0.5-0.9 MG/ML-% IV SOSY
0.2500 mg | PREFILLED_SYRINGE | INTRAVENOUS | Status: DC | PRN
  Administered 2017-02-26 (×2): 0.25 mg via INTRAVENOUS

## 2017-02-26 MED ORDER — POLYETHYLENE GLYCOL 3350 17 G PO PACK
17.0000 g | PACK | Freq: Every day | ORAL | Status: DC | PRN
  Administered 2017-02-27: 10:00:00 17 g via ORAL
  Filled 2017-02-26: qty 1

## 2017-02-26 MED ORDER — METHOCARBAMOL 500 MG PO TABS
500.0000 mg | ORAL_TABLET | Freq: Four times a day (QID) | ORAL | Status: DC | PRN
  Administered 2017-02-26 – 2017-03-01 (×8): 500 mg via ORAL
  Filled 2017-02-26 (×8): qty 1

## 2017-02-26 MED ORDER — ALUM & MAG HYDROXIDE-SIMETH 200-200-20 MG/5ML PO SUSP: 30.0000 mL | ORAL | Status: DC | PRN

## 2017-02-26 MED ORDER — HYDROMORPHONE HCL 2 MG PO TABS
2.0000 mg | ORAL_TABLET | ORAL | Status: DC | PRN
Start: 2017-02-26 — End: 2017-02-27
  Administered 2017-02-26 – 2017-02-27 (×4): 2 mg via ORAL
  Filled 2017-02-26 (×6): qty 1

## 2017-02-26 MED ORDER — ONDANSETRON HCL 4 MG PO TABS: 4.0000 mg | ORAL_TABLET | Freq: Four times a day (QID) | ORAL | Status: DC | PRN

## 2017-02-26 MED ORDER — ACETAMINOPHEN 10 MG/ML IV SOLN
INTRAVENOUS | Status: AC
  Filled 2017-02-26: qty 100

## 2017-02-26 MED ORDER — OXYCODONE HCL 5 MG PO TABS: 5.0000 mg | ORAL_TABLET | Freq: Once | ORAL | Status: DC | PRN

## 2017-02-26 MED ORDER — ONDANSETRON HCL 4 MG/2ML IJ SOLN: 4.0000 mg | Freq: Four times a day (QID) | INTRAMUSCULAR | Status: DC | PRN

## 2017-02-26 MED ORDER — PROPOFOL 10 MG/ML IV BOLUS
INTRAVENOUS | Status: AC
  Filled 2017-02-26: qty 20

## 2017-02-26 MED ORDER — PHENOL 1.4 % MT LIQD: 1.0000 | OROMUCOSAL | Status: DC | PRN

## 2017-02-26 MED ORDER — POLYMYXIN B SULFATE 500000 UNITS IJ SOLR
INTRAMUSCULAR | Status: AC
  Filled 2017-02-26: qty 500000

## 2017-02-26 MED ORDER — DOCUSATE SODIUM 100 MG PO CAPS
100.0000 mg | ORAL_CAPSULE | Freq: Two times a day (BID) | ORAL | Status: DC
  Administered 2017-02-26 – 2017-03-01 (×6): 100 mg via ORAL
  Filled 2017-02-26 (×6): qty 1

## 2017-02-26 MED ORDER — LORATADINE 10 MG PO TABS
10.0000 mg | ORAL_TABLET | Freq: Every day | ORAL | Status: DC
  Administered 2017-02-26 – 2017-03-01 (×4): 10 mg via ORAL
  Filled 2017-02-26 (×4): qty 1

## 2017-02-26 MED ORDER — ONDANSETRON HCL 4 MG/2ML IJ SOLN
INTRAMUSCULAR | Status: DC | PRN
  Administered 2017-02-26: 4 mg via INTRAVENOUS

## 2017-02-26 MED ORDER — INSULIN ASPART 100 UNIT/ML ~~LOC~~ SOLN
0.0000 [IU] | Freq: Three times a day (TID) | SUBCUTANEOUS | Status: DC
  Administered 2017-02-26: 18:00:00 8 [IU] via SUBCUTANEOUS
  Administered 2017-02-27 (×2): 5 [IU] via SUBCUTANEOUS
  Administered 2017-02-27: 3 [IU] via SUBCUTANEOUS
  Administered 2017-02-28 (×2): 5 [IU] via SUBCUTANEOUS
  Administered 2017-02-28 – 2017-03-01 (×2): 3 [IU] via SUBCUTANEOUS
  Filled 2017-02-26 (×2): qty 1

## 2017-02-26 MED ORDER — SODIUM CHLORIDE 0.9 % IR SOLN
Status: DC | PRN
  Administered 2017-02-26: 1000 mL

## 2017-02-26 MED ORDER — HYDROMORPHONE HCL-NACL 0.5-0.9 MG/ML-% IV SOSY
0.5000 mg | PREFILLED_SYRINGE | INTRAVENOUS | Status: DC | PRN
  Administered 2017-02-26 – 2017-02-27 (×7): 1 mg via INTRAVENOUS
  Filled 2017-02-26 (×8): qty 2

## 2017-02-26 MED ORDER — PHENYLEPHRINE 40 MCG/ML (10ML) SYRINGE FOR IV PUSH (FOR BLOOD PRESSURE SUPPORT)
PREFILLED_SYRINGE | INTRAVENOUS | Status: AC
Start: 2017-02-26 — End: 2017-02-26
  Filled 2017-02-26: qty 20

## 2017-02-26 MED ORDER — ACETAMINOPHEN 10 MG/ML IV SOLN
1000.0000 mg | Freq: Four times a day (QID) | INTRAVENOUS | Status: DC
  Administered 2017-02-26: 1000 mg via INTRAVENOUS

## 2017-02-26 MED ORDER — LISINOPRIL 10 MG PO TABS
10.0000 mg | ORAL_TABLET | Freq: Every day | ORAL | Status: DC
  Administered 2017-02-27 – 2017-03-01 (×3): 10 mg via ORAL
  Filled 2017-02-26 (×3): qty 1

## 2017-02-26 MED ORDER — POLYETHYLENE GLYCOL 3350 17 G PO PACK: 17.0000 g | PACK | Freq: Every day | ORAL | 0 refills | Status: DC

## 2017-02-26 MED ORDER — TRANEXAMIC ACID 1000 MG/10ML IV SOLN
1000.0000 mg | INTRAVENOUS | Status: AC
  Administered 2017-02-26: 1000 mg via INTRAVENOUS
  Filled 2017-02-26: qty 1100

## 2017-02-26 MED ORDER — PHENYLEPHRINE HCL 10 MG/ML IJ SOLN
INTRAMUSCULAR | Status: DC | PRN
  Administered 2017-02-26 (×7): 80 ug via INTRAVENOUS

## 2017-02-26 MED ORDER — ACETAMINOPHEN 650 MG RE SUPP: 650.0000 mg | Freq: Four times a day (QID) | RECTAL | Status: DC | PRN

## 2017-02-26 MED ORDER — BUPIVACAINE-EPINEPHRINE (PF) 0.5% -1:200000 IJ SOLN
INTRAMUSCULAR | Status: DC | PRN
  Administered 2017-02-26: 20 mL via PERINEURAL

## 2017-02-26 MED ORDER — DOCUSATE SODIUM 100 MG PO CAPS: 100.0000 mg | ORAL_CAPSULE | Freq: Two times a day (BID) | ORAL | 1 refills | Status: AC | PRN

## 2017-02-26 MED ORDER — OXYCODONE-ACETAMINOPHEN 5-325 MG PO TABS: 1.0000 | ORAL_TABLET | ORAL | 0 refills | Status: DC | PRN

## 2017-02-26 MED ORDER — MAGNESIUM CITRATE PO SOLN: 1.0000 | Freq: Once | ORAL | Status: DC | PRN

## 2017-02-26 MED ORDER — CEFAZOLIN SODIUM-DEXTROSE 2-4 GM/100ML-% IV SOLN
INTRAVENOUS | Status: AC
  Filled 2017-02-26: qty 100

## 2017-02-26 MED ORDER — BISACODYL 5 MG PO TBEC: 5.0000 mg | DELAYED_RELEASE_TABLET | Freq: Every day | ORAL | Status: DC | PRN

## 2017-02-26 MED ORDER — ALBUTEROL SULFATE HFA 108 (90 BASE) MCG/ACT IN AERS: 2.0000 | INHALATION_SPRAY | Freq: Four times a day (QID) | RESPIRATORY_TRACT | Status: DC | PRN

## 2017-02-26 MED ORDER — OXYCODONE HCL 5 MG PO TABS
5.0000 mg | ORAL_TABLET | ORAL | Status: DC | PRN
  Administered 2017-02-26 (×2): 5 mg via ORAL
  Filled 2017-02-26 (×2): qty 1

## 2017-02-26 MED ORDER — MENTHOL 3 MG MT LOZG: 1.0000 | LOZENGE | OROMUCOSAL | Status: DC | PRN

## 2017-02-26 MED ORDER — OXYCODONE HCL 5 MG/5ML PO SOLN
5.0000 mg | Freq: Once | ORAL | Status: DC | PRN
  Filled 2017-02-26: qty 5

## 2017-02-26 MED ORDER — MELATONIN 3 MG PO TABS
3.0000 mg | ORAL_TABLET | Freq: Every evening | ORAL | Status: DC | PRN
Start: 2017-02-26 — End: 2017-02-26

## 2017-02-26 MED ORDER — HYDROMORPHONE HCL-NACL 0.5-0.9 MG/ML-% IV SOSY
0.5000 mg | PREFILLED_SYRINGE | INTRAVENOUS | Status: DC | PRN
  Administered 2017-02-26: 0.5 mg via INTRAVENOUS
  Filled 2017-02-26: qty 1

## 2017-02-26 MED ORDER — METOCLOPRAMIDE HCL 5 MG/ML IJ SOLN: 5.0000 mg | Freq: Three times a day (TID) | INTRAMUSCULAR | Status: DC | PRN

## 2017-02-26 MED ORDER — ASPIRIN EC 325 MG PO TBEC: 325.0000 mg | DELAYED_RELEASE_TABLET | Freq: Two times a day (BID) | ORAL | 1 refills | Status: DC

## 2017-02-26 MED ORDER — MIDAZOLAM HCL 5 MG/5ML IJ SOLN
INTRAMUSCULAR | Status: DC | PRN
  Administered 2017-02-26: 0.5 mg via INTRAVENOUS
  Administered 2017-02-26: 1 mg via INTRAVENOUS
  Administered 2017-02-26: 0.5 mg via INTRAVENOUS

## 2017-02-26 MED ORDER — CITALOPRAM HYDROBROMIDE 20 MG PO TABS
40.0000 mg | ORAL_TABLET | Freq: Every day | ORAL | Status: DC
  Administered 2017-02-27 – 2017-03-01 (×3): 40 mg via ORAL
  Filled 2017-02-26 (×3): qty 2

## 2017-02-26 MED ORDER — PANTOPRAZOLE SODIUM 40 MG PO TBEC
40.0000 mg | DELAYED_RELEASE_TABLET | Freq: Every day | ORAL | Status: DC
  Administered 2017-02-27 – 2017-03-01 (×3): 40 mg via ORAL
  Filled 2017-02-26 (×3): qty 1

## 2017-02-26 MED ORDER — FENTANYL CITRATE (PF) 100 MCG/2ML IJ SOLN
INTRAMUSCULAR | Status: DC | PRN
  Administered 2017-02-26 (×2): 50 ug via INTRAVENOUS

## 2017-02-26 MED ORDER — PROPOFOL 10 MG/ML IV BOLUS
INTRAVENOUS | Status: AC
  Filled 2017-02-26: qty 40

## 2017-02-26 MED ORDER — PROPOFOL 10 MG/ML IV BOLUS
INTRAVENOUS | Status: DC | PRN
  Administered 2017-02-26 (×4): 20 mg via INTRAVENOUS

## 2017-02-26 MED ORDER — CEFAZOLIN SODIUM-DEXTROSE 2-4 GM/100ML-% IV SOLN
2.0000 g | INTRAVENOUS | Status: AC
  Administered 2017-02-26: 2 g via INTRAVENOUS

## 2017-02-26 MED ORDER — PROPOFOL 500 MG/50ML IV EMUL
INTRAVENOUS | Status: DC | PRN
  Administered 2017-02-26: 100 ug/kg/min via INTRAVENOUS
  Administered 2017-02-26: 75 ug/kg/min via INTRAVENOUS

## 2017-02-26 MED ORDER — LISINOPRIL-HYDROCHLOROTHIAZIDE 10-12.5 MG PO TABS: 1.0000 | ORAL_TABLET | Freq: Every day | ORAL | Status: DC

## 2017-02-26 MED ORDER — BUPIVACAINE IN DEXTROSE 0.75-8.25 % IT SOLN
INTRATHECAL | Status: DC | PRN
  Administered 2017-02-26: 1.8 mL via INTRATHECAL

## 2017-02-26 MED ORDER — MIDAZOLAM HCL 2 MG/2ML IJ SOLN
INTRAMUSCULAR | Status: AC
  Filled 2017-02-26: qty 2

## 2017-02-26 MED ORDER — EPHEDRINE SULFATE 50 MG/ML IJ SOLN
INTRAMUSCULAR | Status: DC | PRN
  Administered 2017-02-26 (×2): 10 mg via INTRAVENOUS

## 2017-02-26 MED ORDER — HYDROCHLOROTHIAZIDE 12.5 MG PO CAPS
12.5000 mg | ORAL_CAPSULE | Freq: Every day | ORAL | Status: DC
  Administered 2017-02-27 – 2017-03-01 (×3): 12.5 mg via ORAL
  Filled 2017-02-26 (×3): qty 1

## 2017-02-26 MED ORDER — ACETAMINOPHEN 325 MG PO TABS
650.0000 mg | ORAL_TABLET | Freq: Four times a day (QID) | ORAL | Status: DC | PRN
  Administered 2017-02-27 – 2017-03-01 (×4): 650 mg via ORAL
  Filled 2017-02-26 (×4): qty 2

## 2017-02-26 SURGICAL SUPPLY — 62 items
AGENT HMST SPONGE THK3/8 (HEMOSTASIS) ×1
BAG SPEC THK2 15X12 ZIP CLS (MISCELLANEOUS)
BAG ZIPLOCK 12X15 (MISCELLANEOUS) IMPLANT
BANDAGE ACE 4X5 VEL STRL LF (GAUZE/BANDAGES/DRESSINGS) ×3 IMPLANT
BANDAGE ACE 6X5 VEL STRL LF (GAUZE/BANDAGES/DRESSINGS) ×3 IMPLANT
BLADE SAG 18X100X1.27 (BLADE) ×3 IMPLANT
BLADE SAW SGTL 11.0X1.19X90.0M (BLADE) ×3 IMPLANT
BLADE SAW SGTL 13.0X1.19X90.0M (BLADE) ×3 IMPLANT
CAPT KNEE TOTAL 3 ATTUNE ×2 IMPLANT
CEMENT HV SMART SET (Cement) ×6 IMPLANT
CLOSURE WOUND 1/2 X4 (GAUZE/BANDAGES/DRESSINGS) ×1
CLOTH 2% CHLOROHEXIDINE 3PK (PERSONAL CARE ITEMS) ×3 IMPLANT
COVER SURGICAL LIGHT HANDLE (MISCELLANEOUS) ×3 IMPLANT
CUFF TOURN SGL QUICK 34 (TOURNIQUET CUFF) ×3
CUFF TRNQT CYL 34X4X40X1 (TOURNIQUET CUFF) ×1 IMPLANT
DECANTER SPIKE VIAL GLASS SM (MISCELLANEOUS) ×3 IMPLANT
DRAPE INCISE IOBAN 66X45 STRL (DRAPES) IMPLANT
DRAPE ORTHO SPLIT 77X108 STRL (DRAPES) ×6
DRAPE SHEET LG 3/4 BI-LAMINATE (DRAPES) ×3 IMPLANT
DRAPE SURG ORHT 6 SPLT 77X108 (DRAPES) ×2 IMPLANT
DRAPE U-SHAPE 47X51 STRL (DRAPES) ×3 IMPLANT
DRSG AQUACEL AG ADV 3.5X10 (GAUZE/BANDAGES/DRESSINGS) ×2 IMPLANT
DRSG TEGADERM 4X4.75 (GAUZE/BANDAGES/DRESSINGS) IMPLANT
DURAPREP 26ML APPLICATOR (WOUND CARE) ×3 IMPLANT
ELECT REM PT RETURN 15FT ADLT (MISCELLANEOUS) ×3 IMPLANT
EVACUATOR 1/8 PVC DRAIN (DRAIN) IMPLANT
GAUZE SPONGE 2X2 8PLY STRL LF (GAUZE/BANDAGES/DRESSINGS) IMPLANT
GLOVE BIOGEL PI IND STRL 7.0 (GLOVE) ×1 IMPLANT
GLOVE BIOGEL PI IND STRL 8 (GLOVE) ×1 IMPLANT
GLOVE BIOGEL PI INDICATOR 7.0 (GLOVE) ×2
GLOVE BIOGEL PI INDICATOR 8 (GLOVE) ×2
GLOVE SURG SS PI 7.0 STRL IVOR (GLOVE) ×3 IMPLANT
GLOVE SURG SS PI 7.5 STRL IVOR (GLOVE) ×3 IMPLANT
GLOVE SURG SS PI 8.0 STRL IVOR (GLOVE) ×6 IMPLANT
GOWN STRL REUS W/TWL XL LVL3 (GOWN DISPOSABLE) ×6 IMPLANT
HANDPIECE INTERPULSE COAX TIP (DISPOSABLE) ×3
HEMOSTAT SPONGE AVITENE ULTRA (HEMOSTASIS) ×3 IMPLANT
IMMOBILIZER KNEE 20 (SOFTGOODS) ×3
IMMOBILIZER KNEE 20 THIGH 36 (SOFTGOODS) ×1 IMPLANT
MANIFOLD NEPTUNE II (INSTRUMENTS) ×3 IMPLANT
NS IRRIG 1000ML POUR BTL (IV SOLUTION) IMPLANT
PACK TOTAL KNEE CUSTOM (KITS) ×3 IMPLANT
POSITIONER SURGICAL ARM (MISCELLANEOUS) ×3 IMPLANT
SET HNDPC FAN SPRY TIP SCT (DISPOSABLE) ×1 IMPLANT
SPONGE GAUZE 2X2 STER 10/PKG (GAUZE/BANDAGES/DRESSINGS)
SPONGE SURGIFOAM ABS GEL 100 (HEMOSTASIS) IMPLANT
STAPLER VISISTAT (STAPLE) IMPLANT
STRIP CLOSURE SKIN 1/2X4 (GAUZE/BANDAGES/DRESSINGS) ×1 IMPLANT
SUT BONE WAX W31G (SUTURE) IMPLANT
SUT MNCRL AB 4-0 PS2 18 (SUTURE) IMPLANT
SUT STRATAFIX 0 PDS 27 VIOLET (SUTURE) ×3
SUT VIC AB 1 CT1 27 (SUTURE) ×12
SUT VIC AB 1 CT1 27XBRD ANTBC (SUTURE) ×2 IMPLANT
SUT VIC AB 2-0 CT1 27 (SUTURE) ×9
SUT VIC AB 2-0 CT1 TAPERPNT 27 (SUTURE) ×3 IMPLANT
SUTURE STRATFX 0 PDS 27 VIOLET (SUTURE) ×1 IMPLANT
SYR 50ML LL SCALE MARK (SYRINGE) IMPLANT
TOWER CARTRIDGE SMART MIX (DISPOSABLE) ×3 IMPLANT
TRAY FOLEY W/METER SILVER 16FR (SET/KITS/TRAYS/PACK) ×3 IMPLANT
WATER STERILE IRR 1500ML POUR (IV SOLUTION) ×3 IMPLANT
WRAP KNEE MAXI GEL POST OP (GAUZE/BANDAGES/DRESSINGS) ×3 IMPLANT
YANKAUER SUCT BULB TIP 10FT TU (MISCELLANEOUS) ×3 IMPLANT

## 2017-02-26 NOTE — Anesthesia Procedure Notes (Signed)
Spinal  Patient location during procedure: OR Start time: 02/26/2017 7:37 AM End time: 02/26/2017 7:42 AM Staffing Anesthesiologist: Oleta Mouse Preanesthetic Checklist Completed: patient identified, surgical consent, pre-op evaluation, timeout performed, IV checked, risks and benefits discussed and monitors and equipment checked Spinal Block Patient position: sitting Prep: site prepped and draped and DuraPrep Patient monitoring: heart rate, cardiac monitor, continuous pulse ox and blood pressure Approach: midline Location: L3-4 Injection technique: single-shot Needle Needle type: Pencan  Needle gauge: 24 G Needle length: 10 cm Assessment Sensory level: T4

## 2017-02-26 NOTE — Interval H&P Note (Signed)
History and Physical Interval Note:  02/26/2017 7:24 AM  Bethany Miller  has presented today for surgery, with the diagnosis of Degenerative joint disease right knee  The various methods of treatment have been discussed with the patient and family. After consideration of risks, benefits and other options for treatment, the patient has consented to  Procedure(s): RIGHT TOTAL KNEE ARTHROPLASTY (Right) as a surgical intervention .  The patient's history has been reviewed, patient examined, no change in status, stable for surgery.  I have reviewed the patient's chart and labs.  Questions were answered to the patient's satisfaction.     [name]

## 2017-02-26 NOTE — Progress Notes (Signed)
CSW consulted to assist with SNF placement. H&P indicates this pt will dc home with out pt therapy.  CSW signing off.  Werner Lean LCSW [phone]

## 2017-02-26 NOTE — Brief Op Note (Signed)
02/26/2017  9:32 AM  PATIENT:  [name]  71 y.o. female  PRE-OPERATIVE DIAGNOSIS:  Degenerative joint disease right knee  POST-OPERATIVE DIAGNOSIS:  Degenerative joint disease right knee  PROCEDURE:  Procedure(s): RIGHT TOTAL KNEE ARTHROPLASTY (Right)  SURGEON:  Surgeon(s) and Role:    Susa Day, MD - Primary  PHYSICIAN ASSISTANT:   ASSISTANTS: [place]   ANESTHESIA:   general  EBL:  Total I/O In: 2000 [I.V.:2000] Out: 200 [Urine:150; Blood:50]  BLOOD ADMINISTERED:none  DRAINS: none   LOCAL MEDICATIONS USED:  MARCAINE     SPECIMEN:  No Specimen  DISPOSITION OF SPECIMEN:  N/A  COUNTS:  YES  TOURNIQUET:   Total Tourniquet Time Documented: Thigh (Right) - 65 minutes Total: Thigh (Right) - 65 minutes   DICTATION: .Other Dictation: Dictation Number [id number]  PLAN OF CARE: Admit to inpatient   PATIENT DISPOSITION:  PACU - hemodynamically stable.   Delay start of Pharmacological VTE agent (>24hrs) due to surgical blood loss or risk of bleeding: no

## 2017-02-26 NOTE — Progress Notes (Signed)

## 2017-02-26 NOTE — Progress Notes (Signed)
AssistedDr. Moser with right, ultrasound guided, adductor canal block. Side rails up, monitors on throughout procedure. See vital signs in flow sheet. Tolerated Procedure well.  

## 2017-02-26 NOTE — Transfer of Care (Signed)
Immediate Anesthesia Transfer of Care Note  Patient: Bethany Miller  Procedure(s) Performed: Procedure(s): RIGHT TOTAL KNEE ARTHROPLASTY (Right)  Patient Location: PACU  Anesthesia Type:Spinal and MAC combined with regional for post-op pain  Level of Consciousness:  sedated, patient cooperative and responds to stimulation  Airway & Oxygen Therapy:Patient Spontanous Breathing and Patient connected to face mask oxgen  Post-op Assessment:  Report given to PACU RN and Post -op Vital signs reviewed and stable  Post vital signs:  Reviewed and stable  Last Vitals:  Vitals:   02/26/17 0522  BP: (!) 141/62  Pulse: 64  Resp: 16  Temp: 36.8 C  SpO2: 14%    Complications: No apparent anesthesia complications

## 2017-02-26 NOTE — Addendum Note (Signed)
Addendum  created 02/26/17 1846 by West Pugh, CRNA   Anesthesia Intra Meds edited

## 2017-02-26 NOTE — Anesthesia Preprocedure Evaluation (Signed)
Anesthesia Evaluation  Patient identified by MRN, date of birth, ID band Patient awake    Reviewed: Allergy & Precautions, NPO status , Patient's Chart, lab work & pertinent test results  History of Anesthesia Complications Negative for: history of anesthetic complications  Airway Mallampati: II  TM Distance: >3 FB Neck ROM: Full    Dental  (+) Teeth Intact   Pulmonary neg pulmonary ROS,    breath sounds clear to auscultation       Cardiovascular hypertension, Pt. on medications + Peripheral Vascular Disease   Rhythm:Regular     Neuro/Psych PSYCHIATRIC DISORDERS Anxiety  Neuromuscular disease    GI/Hepatic Neg liver ROS, GERD  Medicated and Controlled,  Endo/Other  diabetes, Type 2  Renal/GU negative Renal ROS     Musculoskeletal   Abdominal   Peds  Hematology negative hematology ROS (+)   Anesthesia Other Findings   Reproductive/Obstetrics                             Anesthesia Physical Anesthesia Plan  ASA: III  Anesthesia Plan: MAC, Regional and Spinal   Post-op Pain Management:    Induction:   PONV Risk Score and Plan: 2 and Ondansetron and Dexamethasone  Airway Management Planned: Nasal Cannula  Additional Equipment: None  Intra-op Plan:   Post-operative Plan: Extubation in OR  Informed Consent: I have reviewed the patients History and Physical, chart, labs and discussed the procedure including the risks, benefits and alternatives for the proposed anesthesia with the patient or authorized representative who has indicated his/her understanding and acceptance.   Dental advisory given  Plan Discussed with: CRNA and Surgeon  Anesthesia Plan Comments:         Anesthesia Quick Evaluation

## 2017-02-26 NOTE — Anesthesia Postprocedure Evaluation (Signed)
Anesthesia Post Note  Patient: Bethany Miller  Procedure(s) Performed: Procedure(s) (LRB): RIGHT TOTAL KNEE ARTHROPLASTY (Right)     Patient location during evaluation: PACU Anesthesia Type: Regional, MAC and Spinal Level of consciousness: awake and alert Pain management: pain level controlled Vital Signs Assessment: post-procedure vital signs reviewed and stable Respiratory status: spontaneous breathing, nonlabored ventilation, respiratory function stable and patient connected to nasal cannula oxygen Cardiovascular status: stable and blood pressure returned to baseline Postop Assessment: no signs of nausea or vomiting and spinal receding Anesthetic complications: no    Last Vitals:  Vitals:   02/26/17 1354 02/26/17 1450  BP: (!) 158/64 (!) 189/88  Pulse: 60 (!) 58  Resp: 18 16  Temp: 36.8 C 36.4 C  SpO2: 100% 98%    Last Pain:  Vitals:   02/26/17 1507  TempSrc:   PainSc: 10-Worst pain ever                 Kameo Bains

## 2017-02-26 NOTE — Anesthesia Procedure Notes (Signed)
Anesthesia Regional Block: Adductor canal block   Pre-Anesthetic Checklist: ,, timeout performed, Correct Patient, Correct Site, Correct Laterality, Correct Procedure, Correct Position, site marked, Risks and benefits discussed,  Surgical consent,  Pre-op evaluation,  At surgeon's request and post-op pain management  Laterality: Lower and Right  Prep: chloraprep       Needles:  Injection technique: Single-shot  Needle Type: Echogenic Stimulator Needle          Additional Needles:   Procedures: ultrasound guided,,,,,,,,  Narrative:  Start time: 02/26/2017 7:24 AM End time: 02/26/2017 7:28 AM Injection made incrementally with aspirations every 5 mL.  Performed by: Personally  Anesthesiologist: Geoge Lawrance  Additional Notes: H+P and labs reviewed, risks and benefits discussed with patient, procedure tolerated well without complications

## 2017-02-26 NOTE — Anesthesia Procedure Notes (Addendum)
Procedure Name: MAC Date/Time: 02/26/2017 7:35 AM Performed by: West Pugh Pre-anesthesia Checklist: Patient identified, Emergency Drugs available, Suction available, Patient being monitored and Timeout performed Patient Re-evaluated:Patient Re-evaluated prior to induction Oxygen Delivery Method: Simple face mask Placement Confirmation: CO2 detector,  positive ETCO2 and breath sounds checked- equal and bilateral Dental Injury: Teeth and Oropharynx as per pre-operative assessment

## 2017-02-26 NOTE — Discharge Instructions (Signed)

## 2017-02-26 NOTE — H&P (View-Only) (Signed)
Bethany Miller DOB: [birth year] Widowed / Language: English / Race: White Female  H&P Date: 02/03/17  Chief Complaint: Right knee pain  History of Present Illness  The patient is a 71 year old female who comes in today for a preoperative History and Physical. The patient is scheduled for a right total knee arthroplasty to be performed by Dr. Johnn Hai, MD at [facility] on 02/26/17. Bethany presents today for preoperative H&P prior to right total knee replacement. She reports ongoing symptoms progressively worsening refractory to conservative treatment including injection therapy, bracing, activity modifications, quadriceps strengthening, weight loss, medication, and relative rest. She is taking narcotic pain medication for this currently. She does feel that the pain is interfering with her quality of life and activities of daily living at this point, and desires to proceed with surgery. She total knee replacement in 2010 which she had revised in 2016 due to loosening which was not traumatic in nature. She has done well with her left knee following the revision. She does report she gets very anxious around the time of surgery. She has no history of MRSA exposure or DVT. She tolerates aspirin well. She had general anesthesia for her knee replacement previously and did well with that, she denies any history of postop nausea and vomiting. she lives with her son who will be able to be with her at night while he is not working, and she plans to go directly to outpatient PT following hospital stay. She has a few steps to enter the home but it is one level.  Dr. Tonita Cong and the patient mutually agreed to proceed with a total knee replacement. Risks and benefits of the procedure were discussed including stiffness, suboptimal range of motion, persistent pain, infection requiring removal of prosthesis and reinsertion, need for prophylactic antibiotics in the future, for example, dental  procedures, possible need for manipulation, revision in the future and also anesthetic complications including DVT, PE, etc. We discussed the perioperative course, time in the hospital, postoperative recovery and the need for elevation to control swelling. We also discussed the predicted range of motion and the probability that squatting and kneeling would be unobtainable in the future. In addition, postoperative anticoagulation was discussed. We have obtained preoperative medical clearance as necessary. Provided illustrated handout and discussed it in detail. They will enroll in the total joint replacement educational forum at the hospital.  Problem List/Past Medical Hx Primary osteoarthritis of right knee (M17.11)  Diabetes Mellitus, Type II  High blood pressure  Hypercholesterolemia   Allergies No Known Drug Allergies [12/30/2016]:  Family History Family history unknown - Adopted  First Degree Relatives  reported  Social History Tobacco use  Never smoker. 12/30/2016 Children  2 Current work status  retired Furniture conservator/restorer weekly; does running / walking and individual sport Living situation  live with son, one level home with few steps to enter Marital status  widowed Never consumed alcohol  12/30/2016: Never consumed alcohol No history of drug/alcohol rehab  Not under pain contract  Number of flights of stairs before winded  2-3 Tobacco / smoke exposure  12/30/2016: no Post-Surgical Plans  home, outpt PT  Medication History Atorvastatin Calcium (40MG  Tablet, Oral) Active. GlipiZIDE (5MG  Tablet, Oral) Active. Lisinopril-Hydrochlorothiazide (10-12.5MG  Tablet, Oral) Active. MetFORMIN HCl ER (500MG  Tablet ER 24HR, Oral) Active. Omeprazole (20MG  Capsule DR, Oral) Active. Oxycodone-Acetaminophen (5-325MG  Tablet, Oral) Active. Medications Reconciled  Pregnancy / Birth History Pregnant  no  Past Surgical History Arthroscopy of Knee  left Foot  Surgery  right Gallbladder Surgery  laporoscopic Hysterectomy  partial (non-cancerous) Spinal Surgery  Tonsillectomy  Total Knee Replacement  left Tubal Ligation   Physical Exam General Mental Status -Alert, cooperative and good historian. General Appearance-pleasant, Not in acute distress. Orientation-Oriented X3. Build & Nutrition-Well nourished and Well developed.  Head and Neck Head-normocephalic, atraumatic . Neck Global Assessment - supple, no bruit auscultated on the right, no bruit auscultated on the left.  Eye Pupil - Bilateral-Regular and Round. Motion - Bilateral-EOMI.  Chest and Lung Exam Auscultation Breath sounds - clear at anterior chest wall and clear at posterior chest wall. Adventitious sounds - No Adventitious sounds.  Cardiovascular Auscultation Rhythm - Regular rate and rhythm. Heart Sounds - S1 WNL and S2 WNL. Murmurs & Other Heart Sounds - Auscultation of the heart reveals - No Murmurs.  Abdomen Palpation/Percussion Tenderness - Abdomen is non-tender to palpation. Rigidity (guarding) - Abdomen is soft. Auscultation Auscultation of the abdomen reveals - Bowel sounds normal.  Female Genitourinary Not done, not pertinent to present illness  Musculoskeletal On exam, I see a healthy female. Walks with an antalgic gait. Tender medial joint line. Patellofemoral pain on compression. Mild effusion. Knee exam on inspection reveals no evidence of soft tissue swelling, ecchymosis, deformity or erythema. On palpation there is no tenderness in the medial and lateral joint line. No patellofemoral pain with compression. Nontender over the fibular head or the peroneal nerve. Nontender over the quadriceps insertion of the patellar ligament insertion. The range of motion was full. Provocative maneuvers revealed a negative Lachman, negative anterior and posterior drawer and a negative McMurray. No instability was noted with varus and valgus stressing  at 0 or 30 degrees. On manual motor test the quadriceps and hamstrings were 5/5. Sensory exam was intact to light touch.  She lacks 5 degrees of extension.  Imaging AP standing and lateral demonstrate tricompartmental osteoarthrosis of the knee.  Assessment & Plan Primary osteoarthritis of right knee (M17.11)  Pt with end-stage right knee DJD, bone-on-bone, refractory to conservative tx, scheduled for right total knee replacement by Dr. Tonita Cong on August 16. We again discussed the procedure itself as well as risks, complications and alternatives, including but not limited to DVT, PE, infx, bleeding, failure of procedure, need for secondary procedure including manipulation, nerve injury, ongoing pain/symptoms, anesthesia risk, even stroke or death. Also discussed typical post-op protocols, activity restrictions, need for PT, flexion/extension exercises, time out of work. Discussed need for DVT ppx post-op per protocol. Discussed dental ppx and infx prevention. Also discussed limitations post-operatively such as kneeling and squatting. All questions were answered. Patient desires to proceed with surgery as scheduled.  Will hold supplements, ASA and NSAIDs accordingly. Will remain NPO after midnight the night before surgery. Will present to [facility] for pre-op testing. Anticipate hospital stay to include at least 2 midnights given medical history and to ensure proper pain control. Plan ASA 325mg  BID for DVT ppx post-op. Plan Percocet, Robaxin, Colace, Miralax. Plan outpatient PT, son at home to help with recovery. Will follow up 10-14 days post-op for suture removal and xrays.  Plan right total knee replacement  Signed electronically by Cecilie Kicks, PA-C for Dr. Tonita Cong

## 2017-02-27 LAB — CBC
HCT: 30.6 % — ABNORMAL LOW (ref 36.0–46.0)
Hemoglobin: 10.5 g/dL — ABNORMAL LOW (ref 12.0–15.0)
MCH: 29.2 pg (ref 26.0–34.0)
MCHC: 34.3 g/dL (ref 30.0–36.0)
MCV: 85 fL (ref 78.0–100.0)
Platelets: 216 10*3/uL (ref 150–400)
RBC: 3.6 MIL/uL — ABNORMAL LOW (ref 3.87–5.11)
RDW: 13.4 % (ref 11.5–15.5)
WBC: 11.5 10*3/uL — ABNORMAL HIGH (ref 4.0–10.5)

## 2017-02-27 LAB — BASIC METABOLIC PANEL
ANION GAP: 8 (ref 5–15)
BUN: 17 mg/dL (ref 6–20)
CHLORIDE: 97 mmol/L — AB (ref 101–111)
CO2: 24 mmol/L (ref 22–32)
Calcium: 9 mg/dL (ref 8.9–10.3)
Creatinine, Ser: 1.04 mg/dL — ABNORMAL HIGH (ref 0.44–1.00)
GFR calc Af Amer: >60 mL/min (ref >60)
GFR calc non Af Amer: 54 mL/min — ABNORMAL LOW (ref >60)
GLUCOSE: 267 mg/dL — AB (ref 65–99)
POTASSIUM: 5.2 mmol/L — AB (ref 3.5–5.1)
Sodium: 129 mmol/L — ABNORMAL LOW (ref 135–145)

## 2017-02-27 LAB — GLUCOSE, CAPILLARY
GLUCOSE-CAPILLARY: 256 mg/dL — AB (ref 65–99)
Glucose-Capillary: 245 mg/dL — ABNORMAL HIGH (ref 65–99)
Glucose-Capillary: 169 mg/dL — ABNORMAL HIGH (ref 65–99)
Glucose-Capillary: 194 mg/dL — ABNORMAL HIGH (ref 65–99)

## 2017-02-27 MED ORDER — HYDROMORPHONE HCL 2 MG PO TABS
2.0000 mg | ORAL_TABLET | ORAL | Status: DC | PRN
  Administered 2017-02-27: 2 mg via ORAL
  Administered 2017-02-27 (×2): 4 mg via ORAL
  Administered 2017-02-27: 2 mg via ORAL
  Administered 2017-02-28: 4 mg via ORAL
  Administered 2017-03-01: 2 mg via ORAL
  Filled 2017-02-27: qty 1
  Filled 2017-02-27 (×3): qty 2
  Filled 2017-02-27 (×2): qty 1
  Filled 2017-02-27: qty 2

## 2017-02-27 MED ORDER — LORAZEPAM 0.5 MG PO TABS
0.5000 mg | ORAL_TABLET | Freq: Four times a day (QID) | ORAL | Status: DC | PRN
  Administered 2017-02-27: 1 mg via ORAL
  Administered 2017-02-27: 0.5 mg via ORAL
  Administered 2017-02-28: 05:00:00 1 mg via ORAL
  Filled 2017-02-27: qty 1
  Filled 2017-02-27 (×2): qty 2

## 2017-02-27 MED ORDER — KETOROLAC TROMETHAMINE 15 MG/ML IJ SOLN
15.0000 mg | Freq: Four times a day (QID) | INTRAMUSCULAR | Status: DC | PRN
Start: 2017-02-27 — End: 2017-03-01
  Administered 2017-02-27 – 2017-02-28 (×4): 15 mg via INTRAVENOUS
  Filled 2017-02-27 (×4): qty 1

## 2017-02-27 NOTE — Progress Notes (Signed)
OT Cancellation Note  Patient Details Name: Shellia Hartl MRN: [id number] DOB: May 17, 1946   Cancelled Treatment:    Reason Eval/Treat Not Completed: Other (comment).  Pt just got back to bed. Will check back later.    Xiong Haidar 02/27/2017, 1:15 PM  Lesle Chris, OTR/L [phone] 02/27/2017

## 2017-02-27 NOTE — Progress Notes (Signed)
Patient ID: Bethany Miller, female   DOB: 1946/03/17, 71 y.o.   MRN: [id number] Subjective: 1 Day Post-Op Procedure(s) (LRB): RIGHT TOTAL KNEE ARTHROPLASTY (Right) Patient reports pain as severe.    Patient has complaints of significant pain R knee, especially posterior knee  We will start therapy today. Plan is to go home after hospital stay. Dilaudid has worked better than Oxy for her in the past. Reports Dilaudid seemed to be helping more yesterday, c/o significant pain in the knee, especially the posterior knee.  Objective: Vital signs in last 24 hours: Temp:  [97.6 F (36.4 C)-98.5 F (36.9 C)] 98.1 F (36.7 C) (08/17 0515) Pulse Rate:  [55-70] 67 (08/17 0515) Resp:  [16-20] 16 (08/17 0515) BP: (104-189)/(54-88) 157/59 (08/17 0515) SpO2:  [94 %-100 %] 98 % (08/17 0515)  Intake/Output from previous day:  Intake/Output Summary (Last 24 hours) at 02/27/17 0756 Last data filed at 02/27/17 0600  Gross per 24 hour  Intake          4303.33 ml  Output             2150 ml  Net          2153.33 ml    Intake/Output this shift: No intake/output data recorded.  Labs: Results for orders placed or performed during the hospital encounter of 02/26/17  Glucose, capillary  Result Value Ref Range   Glucose-Capillary 133 (H) 65 - 99 mg/dL   Comment 1 Notify RN   Glucose, capillary  Result Value Ref Range   Glucose-Capillary 183 (H) 65 - 99 mg/dL   Comment 1 Notify RN    Comment 2 Document in Chart   CBC  Result Value Ref Range   WBC 11.5 (H) 4.0 - 10.5 K/uL   RBC 3.60 (L) 3.87 - 5.11 MIL/uL   Hemoglobin 10.5 (L) 12.0 - 15.0 g/dL   HCT 30.6 (L) 36.0 - 46.0 %   MCV 85.0 78.0 - 100.0 fL   MCH 29.2 26.0 - 34.0 pg   MCHC 34.3 30.0 - 36.0 g/dL   RDW 13.4 11.5 - 15.5 %   Platelets 216 150 - 400 K/uL  Basic metabolic panel  Result Value Ref Range   Sodium 129 (L) 135 - 145 mmol/L   Potassium 5.2 (H) 3.5 - 5.1 mmol/L   Chloride 97 (L) 101 - 111 mmol/L   CO2 24 22 - 32 mmol/L   Glucose, Bld 267 (H) 65 - 99 mg/dL   BUN 17 6 - 20 mg/dL   Creatinine, Ser 1.04 (H) 0.44 - 1.00 mg/dL   Calcium 9.0 8.9 - 10.3 mg/dL   GFR calc non Af Amer 54 (L) >60 mL/min   GFR calc Af Amer >60 >60 mL/min   Anion gap 8 5 - 15  Glucose, capillary  Result Value Ref Range   Glucose-Capillary 265 (H) 65 - 99 mg/dL  Glucose, capillary  Result Value Ref Range   Glucose-Capillary 196 (H) 65 - 99 mg/dL  Glucose, capillary  Result Value Ref Range   Glucose-Capillary 245 (H) 65 - 99 mg/dL  I-STAT 4, (NA,K, GLUC, HGB,HCT)  Result Value Ref Range   Sodium 138 135 - 145 mmol/L   Potassium 4.2 3.5 - 5.1 mmol/L   Glucose, Bld 133 (H) 65 - 99 mg/dL   HCT 31.0 (L) 36.0 - 46.0 %   Hemoglobin 10.5 (L) 12.0 - 15.0 g/dL    Exam - Neurologically intact ABD soft Neurovascular intact Sensation intact distally Intact pulses distally Dorsiflexion/Plantar  flexion intact Incision: dressing C/D/I and no drainage No cellulitis present Compartment soft no calf pain or sign of DVT Dressing - clean, dry, no drainage Motor function intact - moving foot and toes well on exam.   Assessment/Plan: 1 Day Post-Op Procedure(s) (LRB): RIGHT TOTAL KNEE ARTHROPLASTY (Right)  Advance diet Up with therapy D/C IV fluids Past Medical History:  Diagnosis Date  . Diabetes mellitus   . Hyperlipidemia   . Hypertension   . Murmur     DVT Prophylaxis - ASA 325mg  BID Protocol Weight-Bearing as tolerated to Right leg No vaccines. Will increase Dilaudid to 2-4mg  q4-6 Will add Toradol Discussed with Dr. Tonita Cong Plan for D/C home when ready Sat vs Sun depending on pain control and progress with PT Already set up for outpt PT  BISSELL, JACLYN M. 02/27/2017, 7:56 AM

## 2017-02-27 NOTE — Progress Notes (Signed)
Physical Therapy Treatment Patient Details Name: Bethany Miller MRN: [id number] DOB: [birth year] Today's Date: 02/27/2017    History of Present Illness Pt s/p R TKR and with hx of L TKR with revision, DM and back sugery    PT Comments    Marked improvement in activity tolerance vs am session with pt experiencing improved pain control.   Follow Up Recommendations  Home health PT     Equipment Recommendations  None recommended by PT    Recommendations for Other Services OT consult     Precautions / Restrictions Precautions Precautions: Knee;Fall Required Braces or Orthoses: Knee Immobilizer - Right Knee Immobilizer - Right: Discontinue once straight leg raise with < 10 degree lag Restrictions Weight Bearing Restrictions: No Other Position/Activity Restrictions: WBAT    Mobility  Bed Mobility Overal bed mobility: Needs Assistance Bed Mobility: Supine to Sit;Sit to Supine     Supine to sit: Min assist Sit to supine: Min assist   General bed mobility comments: cues for sequence and use of L LE to self assist  Transfers Overall transfer level: Needs assistance Equipment used: Rolling walker (2 wheeled) Transfers: Sit to/from Stand Sit to Stand: Min assist         General transfer comment: cues for LE management and use of UEs to self assist  Ambulation/Gait Ambulation/Gait assistance: Min assist Ambulation Distance (Feet): 50 Feet Assistive device: Rolling walker (2 wheeled) Gait Pattern/deviations: Step-to pattern;Decreased step length - right;Decreased step length - left;Shuffle;Trunk flexed Gait velocity: decr Gait velocity interpretation: Below normal speed for age/gender General Gait Details: cues for sequence, posture and position from RW.  Pt tolerating min WB R LE   Stairs            Wheelchair Mobility    Modified Rankin (Stroke Patients Only)       Balance Overall balance assessment: Needs assistance Sitting-balance support: No upper  extremity supported;Feet supported Sitting balance-Leahy Scale: Good     Standing balance support: Bilateral upper extremity supported Standing balance-Leahy Scale: Fair                              Cognition Arousal/Alertness: Awake/alert Behavior During Therapy: WFL for tasks assessed/performed Overall Cognitive Status: Within Functional Limits for tasks assessed                                        Exercises Total Joint Exercises Ankle Circles/Pumps: AROM;Both;Supine;20 reps Quad Sets: AROM;Both;Supine;10 reps Heel Slides: AAROM;Right;Supine;10 reps Straight Leg Raises: AAROM;Right;10 reps;Supine    General Comments        Pertinent Vitals/Pain Pain Assessment: 0-10 Pain Score: 5  Pain Location: R knee Pain Descriptors / Indicators: Aching;Sore Pain Intervention(s): Limited activity within patient's tolerance;Monitored during session;Premedicated before session;Ice applied    Home Living Family/patient expects to be discharged to:: Private residence Living Arrangements: Children Available Help at Discharge: Family Type of Home: House Home Access: Stairs to enter Entrance Stairs-Rails: None Home Layout: One level Home Equipment: Environmental consultant - 2 wheels;Bedside commode      Prior Function Level of Independence: Independent          PT Goals (current goals can now be found in the care plan section) Acute Rehab PT Goals Patient Stated Goal: Move with less pain and regain IND PT Goal Formulation: With patient Time For Goal Achievement: 03/04/17 Potential to Achieve  Goals: Good Progress towards PT goals: Progressing toward goals    Frequency    7X/week      PT Plan Current plan remains appropriate    Co-evaluation              AM-PAC PT "6 Clicks" Daily Activity  Outcome Measure  Difficulty turning over in bed (including adjusting bedclothes, sheets and blankets)?: Unable Difficulty moving from lying on back to sitting  on the side of the bed? : Unable Difficulty sitting down on and standing up from a chair with arms (e.g., wheelchair, bedside commode, etc,.)?: Unable Help needed moving to and from a bed to chair (including a wheelchair)?: A Little Help needed walking in hospital room?: A Little Help needed climbing 3-5 steps with a railing? : A Lot 6 Click Score: 11    End of Session Equipment Utilized During Treatment: Gait belt;Right knee immobilizer Activity Tolerance: Patient tolerated treatment well Patient left: in bed;with call bell/phone within reach Nurse Communication: Mobility status PT Visit Diagnosis: Unsteadiness on feet (R26.81);Difficulty in walking, not elsewhere classified (R26.2)     Time: 1447-1530 PT Time Calculation (min) (ACUTE ONLY): 43 min  Charges:  $Gait Training: 23-37 mins $Therapeutic Exercise: 8-22 mins                    G Codes:       Pg [phone]    Robin Petrakis 02/27/2017, 3:39 PM

## 2017-02-27 NOTE — Evaluation (Signed)
Physical Therapy Evaluation Patient Details Name: Bethany Miller MRN: [id number] DOB: Jun 14, 1946 Today's Date: 02/27/2017   History of Present Illness  Pt s/p R TKR and with hx of L TKR with revision, DM and back sugery  Clinical Impression  Pt s/p R TKR and presents with decreased R LE strength/ROM and post op pain limiting functional mobility.  Pt should progress to dc home with family assist.      Follow Up Recommendations Home health PT    Equipment Recommendations  None recommended by PT    Recommendations for Other Services OT consult     Precautions / Restrictions Precautions Precautions: Knee;Fall Required Braces or Orthoses: Knee Immobilizer - Right Knee Immobilizer - Right: Discontinue once straight leg raise with < 10 degree lag Restrictions Weight Bearing Restrictions: No Other Position/Activity Restrictions: WBAT      Mobility  Bed Mobility Overal bed mobility: Needs Assistance Bed Mobility: Supine to Sit     Supine to sit: Min assist     General bed mobility comments: cues for sequence and use of L LE to self assist  Transfers Overall transfer level: Needs assistance Equipment used: Rolling walker (2 wheeled) Transfers: Sit to/from Stand Sit to Stand: Min assist;Mod assist         General transfer comment: cues for LE management and use of UEs to self assist  Ambulation/Gait Ambulation/Gait assistance: Min assist;Mod assist Ambulation Distance (Feet): 9 Feet Assistive device: Rolling walker (2 wheeled) Gait Pattern/deviations: Step-to pattern;Decreased step length - right;Decreased step length - left;Shuffle;Trunk flexed Gait velocity: decr Gait velocity interpretation: Below normal speed for age/gender General Gait Details: cues for sequence, posture and position from RW.  Pt tolerating min WB R LE  Stairs            Wheelchair Mobility    Modified Rankin (Stroke Patients Only)       Balance Overall balance assessment: Needs  assistance Sitting-balance support: No upper extremity supported;Feet supported Sitting balance-Leahy Scale: Good     Standing balance support: Bilateral upper extremity supported Standing balance-Leahy Scale: Poor                               Pertinent Vitals/Pain Pain Assessment: 0-10 Pain Score: 6  Pain Location: R knee Pain Descriptors / Indicators: Aching;Sore Pain Intervention(s): Limited activity within patient's tolerance;Monitored during session;Premedicated before session;Ice applied    Home Living Family/patient expects to be discharged to:: Private residence Living Arrangements: Children Available Help at Discharge: Family Type of Home: House Home Access: Stairs to enter Entrance Stairs-Rails: None Entrance Stairs-Number of Steps: 2 Home Layout: One level Home Equipment: Environmental consultant - 2 wheels;Bedside commode      Prior Function Level of Independence: Independent               Hand Dominance        Extremity/Trunk Assessment   Upper Extremity Assessment Upper Extremity Assessment: Overall WFL for tasks assessed    Lower Extremity Assessment Lower Extremity Assessment: RLE deficits/detail RLE Deficits / Details: 2/5 quad strength with AAROM -10- 40 pain limited       Communication   Communication: No difficulties  Cognition Arousal/Alertness: Awake/alert Behavior During Therapy: WFL for tasks assessed/performed Overall Cognitive Status: Within Functional Limits for tasks assessed  General Comments      Exercises Total Joint Exercises Ankle Circles/Pumps: AROM;Both;10 reps;Supine Quad Sets: AROM;Both;5 reps;Supine Heel Slides: AAROM;Right;5 reps;Supine Straight Leg Raises: AAROM;Right;10 reps;Supine   Assessment/Plan    PT Assessment Patient needs continued PT services  PT Problem List Decreased strength;Decreased range of motion;Decreased activity tolerance;Decreased  balance;Decreased mobility;Decreased knowledge of use of DME;Pain       PT Treatment Interventions DME instruction;Gait training;Stair training;Functional mobility training;Therapeutic activities;Therapeutic exercise;Patient/family education    PT Goals (Current goals can be found in the Care Plan section)  Acute Rehab PT Goals Patient Stated Goal: Move with less pain and regain IND PT Goal Formulation: With patient Time For Goal Achievement: 03/04/17 Potential to Achieve Goals: Good    Frequency 7X/week   Barriers to discharge        Co-evaluation               AM-PAC PT "6 Clicks" Daily Activity  Outcome Measure Difficulty turning over in bed (including adjusting bedclothes, sheets and blankets)?: Unable Difficulty moving from lying on back to sitting on the side of the bed? : Unable Difficulty sitting down on and standing up from a chair with arms (e.g., wheelchair, bedside commode, etc,.)?: Unable Help needed moving to and from a bed to chair (including a wheelchair)?: A Lot Help needed walking in hospital room?: A Lot Help needed climbing 3-5 steps with a railing? : A Lot 6 Click Score: 9    End of Session Equipment Utilized During Treatment: Gait belt Activity Tolerance: Patient limited by pain Patient left: in chair;with call bell/phone within reach;with family/visitor present Nurse Communication: Mobility status PT Visit Diagnosis: Unsteadiness on feet (R26.81);Difficulty in walking, not elsewhere classified (R26.2)    Time: 3154-0086 PT Time Calculation (min) (ACUTE ONLY): 43 min   Charges:   PT Evaluation $PT Eval Low Complexity: 1 Low PT Treatments $Gait Training: 8-22 mins $Therapeutic Exercise: 8-22 mins   PT G Codes:        Pg [phone]   Aquan Kope 02/27/2017, 12:48 PM

## 2017-02-27 NOTE — Progress Notes (Signed)
OT Cancellation Note  Patient Details Name: Bethany Miller MRN: [id number] DOB: October 21, 1945   Cancelled Treatment:    Reason Eval/Treat Not Completed: Other (comment).  Pt just finishing with PT and getting back to bed. She requests that OT return tomorrow.  [name] 02/27/2017, 3:16 PM  Lesle Chris, OTR/L [phone] 02/27/2017

## 2017-02-27 NOTE — Progress Notes (Signed)
Inpatient Diabetes Program Recommendations  AACE/ADA: New Consensus Statement on Inpatient Glycemic Control (2015)  Target Ranges:  Prepandial:   less than 140 mg/dL      Peak postprandial:   less than 180 mg/dL (1-2 hours)      Critically ill patients:  140 - 180 mg/dL   Lab Results  Component Value Date   GLUCAP 256 (H) 02/27/2017   HGBA1C 8.5 (H) 02/17/2017   Noted pt received steroid during surgery on 8/17.  If fasting blood sugar continues to be elevated, please consider adding Lantus 15 units daily.  Boody, CDE. M.Ed. Pager [phone] Inpatient Diabetes Coordinator

## 2017-02-27 NOTE — Progress Notes (Signed)
Spoke with patient and son at bedside. Patient states plan has been for OP PT, already arranged. Son and patient are concerned about poor progress and think she may need HHPT. Encouraged them to discuss with attending and continue to see how therapy sessions go while in the hospital. Will f/u tomorrow to assess progress. Patient has all needed DME from previous surgeries. Would like to use [facility] if [facility] is needed.

## 2017-02-28 LAB — CBC
HEMATOCRIT: 28.2 % — AB (ref 36.0–46.0)
Hemoglobin: 9.7 g/dL — ABNORMAL LOW (ref 12.0–15.0)
MCH: 29 pg (ref 26.0–34.0)
MCHC: 34.4 g/dL (ref 30.0–36.0)
MCV: 84.4 fL (ref 78.0–100.0)
Platelets: 179 10*3/uL (ref 150–400)
RBC: 3.34 MIL/uL — AB (ref 3.87–5.11)
RDW: 13.4 % (ref 11.5–15.5)
WBC: 10.4 10*3/uL (ref 4.0–10.5)

## 2017-02-28 LAB — GLUCOSE, CAPILLARY
GLUCOSE-CAPILLARY: 213 mg/dL — AB (ref 65–99)
GLUCOSE-CAPILLARY: 175 mg/dL — AB (ref 65–99)
Glucose-Capillary: 230 mg/dL — ABNORMAL HIGH (ref 65–99)
Glucose-Capillary: 207 mg/dL — ABNORMAL HIGH (ref 65–99)

## 2017-02-28 NOTE — Progress Notes (Signed)
Physical Therapy Treatment Patient Details Name: Bethany Miller MRN: [id number] DOB: 1945-11-29 Today's Date: 02/28/2017    History of Present Illness Pt s/p R TKR and with hx of L TKR with revision, DM and back sugery    PT Comments    Pt feeling much better than this am and progressing well with mobility.   Follow Up Recommendations  Home health PT     Equipment Recommendations  None recommended by PT    Recommendations for Other Services OT consult     Precautions / Restrictions Precautions Precautions: Knee;Fall Required Braces or Orthoses: Knee Immobilizer - Right Knee Immobilizer - Right: Discontinue once straight leg raise with < 10 degree lag Restrictions Weight Bearing Restrictions: No Other Position/Activity Restrictions: WBAT    Mobility  Bed Mobility Overal bed mobility: Needs Assistance Bed Mobility: Supine to Sit;Sit to Supine     Supine to sit: Min guard Sit to supine: Min assist   General bed mobility comments: cues for sequence and use of L LE to self assist  Transfers Overall transfer level: Needs assistance Equipment used: Rolling walker (2 wheeled) Transfers: Sit to/from Stand Sit to Stand: Min guard         General transfer comment: cues for LE management and use of UEs to self assist  Ambulation/Gait Ambulation/Gait assistance: Min assist;Min guard Ambulation Distance (Feet): 123 Feet Assistive device: Rolling walker (2 wheeled) Gait Pattern/deviations: Step-to pattern;Decreased step length - right;Decreased step length - left;Shuffle;Trunk flexed Gait velocity: decr Gait velocity interpretation: Below normal speed for age/gender General Gait Details: cues for sequence, posture and position from RW.     Stairs            Wheelchair Mobility    Modified Rankin (Stroke Patients Only)       Balance                                            Cognition Arousal/Alertness: Awake/alert Behavior During  Therapy: WFL for tasks assessed/performed Overall Cognitive Status: Within Functional Limits for tasks assessed                                        Exercises Total Joint Exercises Ankle Circles/Pumps: AROM;Both;Supine;20 reps Quad Sets: AROM;Both;Supine;10 reps Heel Slides: AAROM;Right;Supine;10 reps Straight Leg Raises: AAROM;Right;10 reps;Supine Goniometric ROM: AAROM R knee -10 - 50    General Comments        Pertinent Vitals/Pain Pain Assessment: 0-10 Pain Score: 5  Pain Location: R knee Pain Descriptors / Indicators: Aching;Sore Pain Intervention(s): Limited activity within patient's tolerance;Premedicated before session;Monitored during session;Ice applied    Home Living                      Prior Function            PT Goals (current goals can now be found in the care plan section) Acute Rehab PT Goals Patient Stated Goal: Move with less pain and regain IND PT Goal Formulation: With patient Time For Goal Achievement: 03/04/17 Potential to Achieve Goals: Good Progress towards PT goals: Progressing toward goals    Frequency    7X/week      PT Plan Current plan remains appropriate    Co-evaluation  AM-PAC PT "6 Clicks" Daily Activity  Outcome Measure  Difficulty turning over in bed (including adjusting bedclothes, sheets and blankets)?: Unable Difficulty moving from lying on back to sitting on the side of the bed? : A Lot Difficulty sitting down on and standing up from a chair with arms (e.g., wheelchair, bedside commode, etc,.)?: A Little Help needed moving to and from a bed to chair (including a wheelchair)?: A Little Help needed walking in hospital room?: A Little Help needed climbing 3-5 steps with a railing? : A Lot 6 Click Score: 14    End of Session Equipment Utilized During Treatment: Gait belt;Right knee immobilizer Activity Tolerance: Patient tolerated treatment well Patient left: in bed;with  call bell/phone within reach Nurse Communication: Mobility status PT Visit Diagnosis: Unsteadiness on feet (R26.81);Difficulty in walking, not elsewhere classified (R26.2)     Time: 9937-1696 PT Time Calculation (min) (ACUTE ONLY): 35 min  Charges:  $Gait Training: 8-22 mins $Therapeutic Exercise: 8-22 mins                    G Codes:       Pg [phone]    Daxton Nydam 02/28/2017, 3:45 PM

## 2017-02-28 NOTE — Evaluation (Signed)
Occupational Therapy Evaluation Patient Details Name: Kieu Quiggle MRN: [id number] DOB: 05-Nov-1945 Today's Date: 02/28/2017    History of Present Illness Pt s/p R TKR and with hx of L TKR with revision, DM and back sugery   Clinical Impression   Upon entering room pt requesting to go back to bed.  Pt does not need any further OT. Education complete    Follow Up Recommendations  No OT follow up    Equipment Recommendations  None recommended by OT       Precautions / Restrictions Precautions Precautions: Knee;Fall Required Braces or Orthoses: Knee Immobilizer - Right Knee Immobilizer - Right: Discontinue once straight leg raise with < 10 degree lag Restrictions Weight Bearing Restrictions: No Other Position/Activity Restrictions: WBAT      Mobility Bed Mobility Overal bed mobility: Needs Assistance Bed Mobility: Sit to Supine     Supine to sit: Min guard Sit to supine: Min guard   General bed mobility comments: cues for sequence and use of L LE to self assist  Transfers Overall transfer level: Needs assistance Equipment used: Rolling walker (2 wheeled) Transfers: Sit to/from [facility] Sit to Stand: Min guard Stand pivot transfers: Min guard       General transfer comment: cues for LE management and use of UEs to self assist        ADL either performed or assessed with clinical judgement   ADL Overall ADL's : Needs assistance/impaired        Education provided to pt and son regarding ADL activity s/p TKR. Pt and son verbalized understanding as well as pt has all needed DME. Son will A with ADL's as needed                               General ADL Comments: son will A As needed. Pt has all needed DME.       Vision Patient Visual Report: No change from baseline              Pertinent Vitals/Pain Pain Location: R knee Pain Descriptors / Indicators: Aching;Sore Pain Intervention(s): Limited activity within patient's  tolerance;Repositioned;Monitored during session     Hand Dominance     Extremity/Trunk Assessment Upper Extremity Assessment Upper Extremity Assessment: Overall WFL for tasks assessed           Communication Communication Communication: No difficulties   Cognition Arousal/Alertness: Awake/alert Behavior During Therapy: WFL for tasks assessed/performed Overall Cognitive Status: Within Functional Limits for tasks assessed                                                Home Living Family/patient expects to be discharged to:: Private residence Living Arrangements: Children Available Help at Discharge: Family Type of Home: House Home Access: Stairs to enter Technical brewer of Steps: 2 Entrance Stairs-Rails: None Home Layout: One level               Home Equipment: Environmental consultant - 2 wheels;Bedside commode          Prior Functioning/Environment Level of Independence: Independent                          OT Goals(Current goals can be found in the care plan section) Acute Rehab OT Goals Patient  Stated Goal: Move with less pain and regain IND  OT Frequency:                AM-PAC PT "6 Clicks" Daily Activity     Outcome Measure Help from another person eating meals?: None Help from another person taking care of personal grooming?: None Help from another person toileting, which includes using toliet, bedpan, or urinal?: A Little Help from another person bathing (including washing, rinsing, drying)?: A Little Help from another person to put on and taking off regular upper body clothing?: None Help from another person to put on and taking off regular lower body clothing?: A Little 6 Click Score: 21   End of Session CPM Right Knee CPM Right Knee: Off Nurse Communication: Mobility status  Activity Tolerance: Patient tolerated treatment well Patient left: in bed;with call bell/phone within reach;with family/visitor present;with bed  alarm set  OT Visit Diagnosis: Unsteadiness on feet (R26.81)                Time: 1771-1657 OT Time Calculation (min): 16 min Charges:  OT General Charges $OT Visit: 1 Procedure OT Evaluation $OT Eval Low Complexity: 1 Procedure G-Codes:     Kari Baars, OT [phone]  Payton Mccallum D 02/28/2017, 11:05 AM

## 2017-02-28 NOTE — Progress Notes (Signed)
Subjective: 2 Days Post-Op Procedure(s) (LRB): RIGHT TOTAL KNEE ARTHROPLASTY (Right) Patient reports pain as 5 on 0-10 scale.   The patient is sitting comfortably in bed but states that her pain is mild to moderate depending on how she moves her leg.  She is still having some difficulty getting out of bed and ambulating on her own. She is tolerating her diet with no difficulty. She states that she has passed flatus but has not had a bowel movement. She denies calf pain, shortness of breath, chest pain, fever, chills, nausea, vomiting, or diarrhea.  Objective: Vital signs in last 24 hours: Temp:  [98 F (36.7 C)-98.4 F (36.9 C)] 98 F (36.7 C) (08/18 0500) Pulse Rate:  [66-79] 79 (08/18 0843) Resp:  [17-18] 17 (08/18 0500) BP: (161-176)/(68-69) 176/68 (08/18 0843) SpO2:  [93 %-96 %] 93 % (08/18 0500)  Intake/Output from previous day: 08/17 0701 - 08/18 0700 In: 2220 [P.O.:1620; I.V.:600] Out: 2850 [Urine:2850] Intake/Output this shift: Total I/O In: -  Out: 500 [Urine:500]   Recent Labs  02/26/17 0649 02/27/17 0616 02/28/17 0509  HGB 10.5* 10.5* 9.7*    Recent Labs  02/27/17 0616 02/28/17 0509  WBC 11.5* 10.4  RBC 3.60* 3.34*  HCT 30.6* 28.2*  PLT 216 179    Recent Labs  02/26/17 0649 02/27/17 0616  NA 138 129*  K 4.2 5.2*  CL  --  97*  CO2  --  24  BUN  --  17  CREATININE  --  1.04*  GLUCOSE 133* 267*  CALCIUM  --  9.0   No results for input(s): LABPT, INR in the last 72 hours.  Alert and oriented x 3. Dressing is clean, dry, and intact. Grossly intact neurovascularly. Distal sensation intact to light touch. Distal pulses 2+ bilaterally. Dorsiflexion/Plantar flexion intact  Assessment/Plan: 2 Days Post-Op Procedure(s) (LRB): RIGHT TOTAL KNEE ARTHROPLASTY (Right) Up with therapy - WBAT to the Right Leg DVT Prophylaxis - ASA 325mg  BID per protocol Continue with pain management as needed. Plan on d/c home tomorrow depending on progress with  PT.  Brynda Peon 02/28/2017, 9:32 AM

## 2017-03-01 LAB — CBC
HEMATOCRIT: 25.2 % — AB (ref 36.0–46.0)
HEMOGLOBIN: 8.7 g/dL — AB (ref 12.0–15.0)
MCH: 29.4 pg (ref 26.0–34.0)
MCHC: 34.5 g/dL (ref 30.0–36.0)
MCV: 85.1 fL (ref 78.0–100.0)
Platelets: 197 10*3/uL (ref 150–400)
RBC: 2.96 MIL/uL — AB (ref 3.87–5.11)
RDW: 13.6 % (ref 11.5–15.5)
WBC: 8.7 10*3/uL (ref 4.0–10.5)

## 2017-03-01 LAB — GLUCOSE, CAPILLARY: Glucose-Capillary: 193 mg/dL — ABNORMAL HIGH (ref 65–99)

## 2017-03-01 NOTE — Discharge Summary (Signed)
Physician Discharge Summary  Patient ID: Bethany Miller MRN: [id number] DOB/AGE: Jul 26, 1945 71 y.o.  Admit date: 02/26/2017 Discharge date: 03/01/17  Admission Diagnoses: Degenerative joint disease right knee Past Medical History:  Diagnosis Date  . Diabetes mellitus   . Hyperlipidemia   . Hypertension   . Murmur     Discharge Diagnoses:  Principal Problem:   Primary osteoarthritis of right knee Active Problems:   Right knee DJD   Surgeries: Procedure(s): RIGHT TOTAL KNEE ARTHROPLASTY on 02/26/2017    Consultants:   Discharged Condition: Improved  Hospital Course: Bethany Miller is an 71 y.o. female who was admitted 02/26/2017 with a chief complaint of No chief complaint on file. , and found to have a diagnosis of Degenerative joint disease right knee.  They were brought to the operating room on 02/26/2017 and underwent Procedure(s): RIGHT TOTAL KNEE ARTHROPLASTY.    They were given perioperative antibiotics: Anti-infectives    Start     Dose/Rate Route Frequency Ordered Stop   02/26/17 1400  ceFAZolin (ANCEF) IVPB 2g/100 mL premix     2 g 200 mL/hr over 30 Minutes Intravenous Every 6 hours 02/26/17 1156 02/27/17 0201   02/26/17 0812  polymyxin B 500,000 Units, bacitracin 50,000 Units in sodium chloride 0.9 % 500 mL irrigation  Status:  Discontinued       As needed 02/26/17 0812 02/26/17 0955   02/26/17 0658  ceFAZolin (ANCEF) 2-4 GM/100ML-% IVPB    Comments:  Christell Faith   : cabinet override      02/26/17 0658 02/26/17 0749   02/26/17 0520  ceFAZolin (ANCEF) IVPB 2g/100 mL premix     2 g 200 mL/hr over 30 Minutes Intravenous On call to O.R. 02/26/17 1245 02/26/17 0804    .  They were given sequential compression devices, early ambulation, and Other (comment) Aspirin, TED hose, ambulation for DVT prophylaxis.  Recent vital signs: Patient Vitals for the past 24 hrs:  BP Temp Temp src Pulse Resp SpO2  03/01/17 0500 (!) 109/41 98.7 F (37.1 C) Oral 82 16 95 %   02/28/17 2107 (!) 126/50 98.8 F (37.1 C) Oral 76 16 93 %  02/28/17 2000 (!) 142/56 98.3 F (36.8 C) Oral 75 18 95 %  02/28/17 1445 (!) 170/67 98.7 F (37.1 C) Oral 80 16 98 %  02/28/17 0843 (!) 176/68 - - 79 - -  .  Recent laboratory studies: No results found.  Discharge Medications:   Allergies as of 03/01/2017   No Known Allergies     Medication List    STOP taking these medications   ezetimibe 10 MG tablet Commonly known as:  ZETIA   fenofibrate 160 MG tablet   linagliptin 5 MG Tabs tablet Commonly known as:  TRADJENTA     TAKE these medications   acetaminophen 650 MG CR tablet Commonly known as:  TYLENOL Take 650 mg by mouth every 8 (eight) hours as needed for pain.   albuterol 108 (90 Base) MCG/ACT inhaler Commonly known as:  PROVENTIL HFA;VENTOLIN HFA Inhale 2 puffs into the lungs every 6 (six) hours as needed for wheezing or shortness of breath.   Alogliptin Benzoate 25 MG Tabs Take by mouth 25 mg daily before breakfast   aspirin EC 325 MG tablet Take 1 tablet (325 mg total) by mouth 2 (two) times daily after a meal.   atorvastatin 40 MG tablet Commonly known as:  LIPITOR TAKE 1 TABLET EVERY DAY   citalopram 40 MG tablet Commonly known as:  CELEXA  TAKE 1 TABLET (40 MG TOTAL) BY MOUTH DAILY.   CLARITIN-D 12 HOUR PO Take 1 tablet by mouth daily as needed (sinus).   docusate sodium 100 MG capsule Commonly known as:  COLACE Take 1 capsule (100 mg total) by mouth 2 (two) times daily as needed for mild constipation.   FREESTYLE UNISTICK II LANCETS Misc by Does not apply route.   glipiZIDE 5 MG tablet Commonly known as:  GLUCOTROL Take 1 tablet (5 mg total) by mouth 2 (two) times daily before a meal.   glucose blood test strip Commonly known as:  FREESTYLE INSULINX TEST Check blood sugar once a day   ibuprofen 800 MG tablet Commonly known as:  ADVIL,MOTRIN Take 800 mg by mouth 3 (three) times daily as needed for moderate pain.    lisinopril-hydrochlorothiazide 10-12.5 MG tablet Commonly known as:  PRINZIDE,ZESTORETIC TAKE 1 TABLET BY MOUTH EVERY DAY   Melatonin 3 MG Tabs Take 3 mg by mouth at bedtime as needed (sleep).   metFORMIN 500 MG 24 hr tablet Commonly known as:  GLUCOPHAGE-XR Take 2 tablets (1,000 mg total) by mouth 2 (two) times daily after a meal.   MOVE FREE PO Take 1 tablet by mouth daily.   MULTIVITAMIN ADULT PO Take 1 tablet by mouth daily.   omeprazole 20 MG capsule Commonly known as:  PRILOSEC Take 1 capsule (20 mg total) by mouth daily.   oxyCODONE-acetaminophen 5-325 MG tablet Commonly known as:  PERCOCET Take 1-2 tablets by mouth every 4 (four) hours as needed for severe pain. What changed:  reasons to take this   polyethylene glycol packet Commonly known as:  MIRALAX / GLYCOLAX Take 17 g by mouth daily.       Diagnostic Studies: Dg Knee Right Port  Result Date: 02/26/2017 CLINICAL DATA:  Post right knee replacement EXAM: PORTABLE RIGHT KNEE - 1-2 VIEW COMPARISON:  12/24/2016 FINDINGS: Changes of right knee replacement. No hardware or bony complicating feature. Soft tissue and joint space gas noted. IMPRESSION: Right knee replacement.  No complicating feature. Electronically Signed   By: Rolm Baptise M.D.   On: 02/26/2017 10:41    They benefited maximally from their hospital stay and there were no complications.     Disposition: 01-Home or Self Care  Follow-up Information    Susa Day, MD Follow up in 2 week(s).   Specialty:  Orthopedic Surgery Contact information: [address] [place] [address] [phone]        Susa Day, MD In 2 weeks.   Specialty:  Orthopedic Surgery Contact information: [address] [place] [address] [phone]            Signed: Brynda Peon 03/01/2017, 8:27 AM

## 2017-03-01 NOTE — Progress Notes (Signed)
Patient was discharged home . Discharge instructions were given to patient and family by Amy RN

## 2017-03-01 NOTE — Progress Notes (Signed)
Physical Therapy Treatment Patient Details Name: Bethany Miller MRN: [id number] DOB: [birth year] Today's Date: 03/01/2017    History of Present Illness Pt s/p R TKR and with hx of L TKR with revision, DM and back sugery    PT Comments    The patient is progressing well. Plans Dc today.   Follow Up Recommendations  Home health PT     Equipment Recommendations  None recommended by PT    Recommendations for Other Services       Precautions / Restrictions Precautions Precautions: Knee;Fall Required Braces or Orthoses: Knee Immobilizer - Right Knee Immobilizer - Right: Discontinue once straight leg raise with < 10 degree lag    Mobility  Bed Mobility   Bed Mobility: Supine to Sit     Supine to sit: Min guard     General bed mobility comments: cues for sequence and use of L LE to self assist  Transfers Overall transfer level: Needs assistance Equipment used: Rolling walker (2 wheeled) Transfers: Sit to/from Stand Sit to Stand: Min guard         General transfer comment: cues for LE management and use of UEs to self assist  Ambulation/Gait Ambulation/Gait assistance: Min assist;Min guard Ambulation Distance (Feet): 120 Feet Assistive device: Rolling walker (2 wheeled) Gait Pattern/deviations: Step-to pattern;Decreased step length - right;Decreased step length - left;Shuffle;Trunk flexed Gait velocity: decr Gait velocity interpretation: Below normal speed for age/gender General Gait Details: cues for sequence, posture and position from RW.     Stairs Stairs: Yes   Stair Management: No rails;Step to pattern;With walker;Forwards Number of Stairs: 1 General stair comments: cues for sequence  Wheelchair Mobility    Modified Rankin (Stroke Patients Only)       Balance                                            Cognition Arousal/Alertness: Awake/alert                                            Exercises Total Joint  Exercises Ankle Circles/Pumps: AROM;Both;Supine;20 reps Quad Sets: AROM;Both;Supine;10 reps Towel Squeeze: AROM;Right;10 reps;Supine Short Arc Quad: AROM;Right;10 reps;Supine Heel Slides: AAROM;Right;Supine;10 reps Hip ABduction/ADduction: AROM Straight Leg Raises: AAROM;Right;10 reps;Supine    General Comments        Pertinent Vitals/Pain Pain Score: 5  Pain Location: R knee Pain Descriptors / Indicators: Aching;Sore Pain Intervention(s): Limited activity within patient's tolerance;Monitored during session;Premedicated before session;Repositioned;Ice applied    Home Living                      Prior Function            PT Goals (current goals can now be found in the care plan section)      Frequency    7X/week      PT Plan Current plan remains appropriate    Co-evaluation              AM-PAC PT "6 Clicks" Daily Activity  Outcome Measure  Difficulty turning over in bed (including adjusting bedclothes, sheets and blankets)?: A Little Difficulty moving from lying on back to sitting on the side of the bed? : A Little Difficulty sitting down on and standing up from a chair  with arms (e.g., wheelchair, bedside commode, etc,.)?: A Little Help needed moving to and from a bed to chair (including a wheelchair)?: A Little   Help needed climbing 3-5 steps with a railing? : A Lot 6 Click Score: 14    End of Session Equipment Utilized During Treatment: Gait belt;Right knee immobilizer Activity Tolerance: Patient tolerated treatment well Patient left: in chair;with call bell/phone within reach;with family/visitor present Nurse Communication: Mobility status PT Visit Diagnosis: Unsteadiness on feet (R26.81);Difficulty in walking, not elsewhere classified (R26.2);Pain Pain - Right/Left: Right Pain - part of body: Knee     Time: 8003-4917 PT Time Calculation (min) (ACUTE ONLY): 28 min  Charges:  $Gait Training: 8-22 mins $Therapeutic Exercise: 8-22  mins                    G CodesTresa Endo PT [phone]    Claretha Cooper 03/01/2017, 1:42 PM

## 2017-03-02 NOTE — Op Note (Signed)
NAMEBRADLEY, HANDYSIDE               ACCOUNT NO.:  [id number]  MEDICAL RECORD NO.:  [id number]  LOCATION:                                 FACILITY:  PHYSICIAN:  Susa Day, M.D.         DATE OF BIRTH:  DATE OF PROCEDURE:  02/26/2017 DATE OF DISCHARGE:                              OPERATIVE REPORT   PREOPERATIVE DIAGNOSIS:  Degenerative joint disease, end-stage, right knee.  POSTOPERATIVE DIAGNOSIS:  Degenerative joint disease, end-stage, right knee.  PROCEDURE PERFORMED:  Right total knee arthroplasty.  ANESTHESIA:  Spinal.  ASSISTANT:  Cleophas Dunker, PA.  COMPLEMENTS:  DePuy Attune 5 femur, 5 tibia, 6 insert, 35 patella.  HISTORY:  A 71, end-stage osteoarthrosis of the knee, protected the patellofemoral joint and medial compartment, refractory to conservative treatment, negative affect to her activities of daily living, indicated for replacement of the degenerated joint.  Risks and benefits were discussed including bleeding, infection, damage to the neurovascular structures, no change in symptoms, worsening symptoms, DVT, PE, anesthetic complication, complement failure, etc.  TECHNIQUE:  With the patient in supine position after induction of adequate spinal anesthesia, 2 g of Kefzol, the right lower extremity was prepped and draped, and exsanguinated in usual sterile fashion.  Thigh tourniquet inflated to 250 mmHg.  A midline incision was then made over the patella, full-thickness flaps developed.  Hypertrophic bursa was noted, excised.  Median parapatellar arthrotomy was performed.  We elevated the soft tissues medially preserving the MCL.  Tricompartmental osteoarthrosis was noted particularly the patellofemoral joint.  Large osteophytes were present in the patellofemoral joint, these were excised.  Remnants of the medial and lateral menisci were removed.  Step drill utilized and the femoral canal was irrigated, 5-degree right distal femoral jig was then placed,  9 off the distal femur, and this was pinned with an oscillating saw, performed that distal cut.  This was then sized off the anterior cortex to a 5, pin with 3 degrees of external rotation, with appropriate resection.  This was then pinned. We placed our block and performed anterior, posterior and chamfer cuts with the soft tissues protected posteriorly at all times.  We then subluxed the tibia where it was medially bone-on-bone.  We selected 2 off the defect, which was medially.  External alignment guide, bisecting tibiotalar joint parallel to the shaft, 3-degree slope, which was 10 off the lateral side.  This was then pinned.  We performed our tibial cut with a soft tissues well protected.  We checked our extension gap, and it was satisfactory at a 6.  We then flexed the knee and completed our tibial cut.  Exposed and measured it maximally to 5, maximal coverage just to the medial aspect of tibial tubercle.  We harvested the bone from the central tibial canal.  We then drilled centrally, punch guide was utilized with a trial 5.  We then turned our attention back towards the femur, we performed our notch cut after appropriate sizing jig was placed bisecting the canal.  We then placed our trial femur, drilled our lug holes, trial tibia, 6 insert, and we reduced it.  We had full extension, full flexion, good stability, varus  and valgus stressing 0-30 degrees.  Then, we turned attention toward the patella, was measured from its low point to about a 22.  We planed 6.5 from that to a 15, measured it to a 35, drilled our PEG holes medializing them.  Placed a trial 35, reduced it and had excellent patellofemoral tracking noted with towel clips applied.  I then removed all trials and placed a bone plug in the femoral canal, irrigated the joint with pulsatile lavage. Mixed the cement in the back table in appropriate fashion.  Flexed the knee, subluxed the tibia, dried all surfaces thoroughly and  injected the cement into the tibial canal and digitally pressurized it and impacted the 5 tibial tray, redundant cement removed.  I impacted the 5 femur and redundant cement removed.  I cemented and clamped the patella.  The knee was held in extension and axial load applied throughout the curing of the cement.  A 0.25% Marcaine with epinephrine was infiltrated in the periarticular tissues.  Irrigated with antibiotic irrigation.  After appropriate curing of the cement, the tourniquet was deflated at 65 minutes.  Any noticeable bleeding was cauterized.  It was minimal.  The knee was then flexed.  We meticulously removed all redundant cement from the femur, tibia and patella.  Irrigated it posteriorly.  I then selected a 6 permanent insert after trialing the trial satisfactory. Tibia was subluxed, irrigated, and inserted the 6 insert, reduced it, had full extension, full flexion, good stability, varus and valgus stressing at 0-30 degrees, negative anterior drawer.  I then reapproximated the patellar arthrotomy to its original position and reapproximated the arthrotomy with #1 Vicryl interrupted figure-of-eight sutures, oversewn with a running Stratafix.  Flexion to 90 degrees against gravity and again excellent patellofemoral tracking after closure of the arthrotomy.  Copious irrigation once again, subcu with 2 and skin with Monocryl.  Sterile dressing applied.  Placed in immobilizer, transported to the recovery room in satisfactory condition.  The patient tolerated the procedure well.  No complications.  Assistant, Cleophas Dunker, PA, was used throughout the case for patient positioning, retraction and closure.     Susa Day, M.D.   ______________________________ Susa Day, M.D.    Geralynn Rile  D:  02/26/2017  T:  02/26/2017  Job:  [id number]

## 2017-03-03 ENCOUNTER — Encounter: Payer: Self-pay | Admitting: Family Medicine

## 2017-03-04 DIAGNOSIS — E119 Type 2 diabetes mellitus without complications: Secondary | ICD-10-CM | POA: Diagnosis not present

## 2017-03-04 DIAGNOSIS — Z471 Aftercare following joint replacement surgery: Secondary | ICD-10-CM | POA: Diagnosis not present

## 2017-03-04 DIAGNOSIS — Z7984 Long term (current) use of oral hypoglycemic drugs: Secondary | ICD-10-CM | POA: Diagnosis not present

## 2017-03-04 DIAGNOSIS — I10 Essential (primary) hypertension: Secondary | ICD-10-CM | POA: Diagnosis not present

## 2017-03-04 DIAGNOSIS — Z79891 Long term (current) use of opiate analgesic: Secondary | ICD-10-CM | POA: Diagnosis not present

## 2017-03-04 DIAGNOSIS — Z96651 Presence of right artificial knee joint: Secondary | ICD-10-CM | POA: Diagnosis not present

## 2017-03-05 DIAGNOSIS — Z7984 Long term (current) use of oral hypoglycemic drugs: Secondary | ICD-10-CM | POA: Diagnosis not present

## 2017-03-05 DIAGNOSIS — Z79891 Long term (current) use of opiate analgesic: Secondary | ICD-10-CM | POA: Diagnosis not present

## 2017-03-05 DIAGNOSIS — E119 Type 2 diabetes mellitus without complications: Secondary | ICD-10-CM | POA: Diagnosis not present

## 2017-03-05 DIAGNOSIS — Z96651 Presence of right artificial knee joint: Secondary | ICD-10-CM | POA: Diagnosis not present

## 2017-03-05 DIAGNOSIS — I10 Essential (primary) hypertension: Secondary | ICD-10-CM | POA: Diagnosis not present

## 2017-03-05 DIAGNOSIS — Z471 Aftercare following joint replacement surgery: Secondary | ICD-10-CM | POA: Diagnosis not present

## 2017-03-06 DIAGNOSIS — Z79891 Long term (current) use of opiate analgesic: Secondary | ICD-10-CM | POA: Diagnosis not present

## 2017-03-06 DIAGNOSIS — Z96651 Presence of right artificial knee joint: Secondary | ICD-10-CM | POA: Diagnosis not present

## 2017-03-06 DIAGNOSIS — Z471 Aftercare following joint replacement surgery: Secondary | ICD-10-CM | POA: Diagnosis not present

## 2017-03-06 DIAGNOSIS — E119 Type 2 diabetes mellitus without complications: Secondary | ICD-10-CM | POA: Diagnosis not present

## 2017-03-06 DIAGNOSIS — Z7984 Long term (current) use of oral hypoglycemic drugs: Secondary | ICD-10-CM | POA: Diagnosis not present

## 2017-03-06 DIAGNOSIS — I10 Essential (primary) hypertension: Secondary | ICD-10-CM | POA: Diagnosis not present

## 2017-03-06 DIAGNOSIS — Z9660 Presence of unspecified orthopedic joint implant: Secondary | ICD-10-CM | POA: Diagnosis not present

## 2017-03-09 DIAGNOSIS — Z96651 Presence of right artificial knee joint: Secondary | ICD-10-CM | POA: Diagnosis not present

## 2017-03-09 DIAGNOSIS — Z471 Aftercare following joint replacement surgery: Secondary | ICD-10-CM | POA: Diagnosis not present

## 2017-03-09 DIAGNOSIS — Z79891 Long term (current) use of opiate analgesic: Secondary | ICD-10-CM | POA: Diagnosis not present

## 2017-03-09 DIAGNOSIS — I10 Essential (primary) hypertension: Secondary | ICD-10-CM | POA: Diagnosis not present

## 2017-03-09 DIAGNOSIS — E119 Type 2 diabetes mellitus without complications: Secondary | ICD-10-CM | POA: Diagnosis not present

## 2017-03-09 DIAGNOSIS — Z7984 Long term (current) use of oral hypoglycemic drugs: Secondary | ICD-10-CM | POA: Diagnosis not present

## 2017-03-10 ENCOUNTER — Ambulatory Visit: Payer: BLUE CROSS/BLUE SHIELD | Admitting: Internal Medicine

## 2017-03-11 DIAGNOSIS — Z471 Aftercare following joint replacement surgery: Secondary | ICD-10-CM | POA: Diagnosis not present

## 2017-03-11 DIAGNOSIS — E119 Type 2 diabetes mellitus without complications: Secondary | ICD-10-CM | POA: Diagnosis not present

## 2017-03-11 DIAGNOSIS — Z7984 Long term (current) use of oral hypoglycemic drugs: Secondary | ICD-10-CM | POA: Diagnosis not present

## 2017-03-11 DIAGNOSIS — Z79891 Long term (current) use of opiate analgesic: Secondary | ICD-10-CM | POA: Diagnosis not present

## 2017-03-11 DIAGNOSIS — Z96651 Presence of right artificial knee joint: Secondary | ICD-10-CM | POA: Diagnosis not present

## 2017-03-11 DIAGNOSIS — I10 Essential (primary) hypertension: Secondary | ICD-10-CM | POA: Diagnosis not present

## 2017-03-12 DIAGNOSIS — Z96651 Presence of right artificial knee joint: Secondary | ICD-10-CM | POA: Diagnosis not present

## 2017-03-12 DIAGNOSIS — Z471 Aftercare following joint replacement surgery: Secondary | ICD-10-CM | POA: Diagnosis not present

## 2017-03-13 DIAGNOSIS — I10 Essential (primary) hypertension: Secondary | ICD-10-CM | POA: Diagnosis not present

## 2017-03-13 DIAGNOSIS — Z96651 Presence of right artificial knee joint: Secondary | ICD-10-CM | POA: Diagnosis not present

## 2017-03-13 DIAGNOSIS — Z7984 Long term (current) use of oral hypoglycemic drugs: Secondary | ICD-10-CM | POA: Diagnosis not present

## 2017-03-13 DIAGNOSIS — Z471 Aftercare following joint replacement surgery: Secondary | ICD-10-CM | POA: Diagnosis not present

## 2017-03-13 DIAGNOSIS — E119 Type 2 diabetes mellitus without complications: Secondary | ICD-10-CM | POA: Diagnosis not present

## 2017-03-13 DIAGNOSIS — Z79891 Long term (current) use of opiate analgesic: Secondary | ICD-10-CM | POA: Diagnosis not present

## 2017-03-19 DIAGNOSIS — Z96651 Presence of right artificial knee joint: Secondary | ICD-10-CM | POA: Diagnosis not present

## 2017-03-19 DIAGNOSIS — M25661 Stiffness of right knee, not elsewhere classified: Secondary | ICD-10-CM | POA: Diagnosis not present

## 2017-03-23 DIAGNOSIS — M25661 Stiffness of right knee, not elsewhere classified: Secondary | ICD-10-CM | POA: Diagnosis not present

## 2017-03-23 DIAGNOSIS — Z96651 Presence of right artificial knee joint: Secondary | ICD-10-CM | POA: Diagnosis not present

## 2017-03-24 ENCOUNTER — Telehealth: Payer: Self-pay | Admitting: Internal Medicine

## 2017-03-24 ENCOUNTER — Telehealth: Payer: Self-pay

## 2017-03-24 NOTE — Telephone Encounter (Signed)
[employer] calling in reference to requesting new Rx for metFORMIN (GLUCOPHAGE-XR) 500 MG 24 hr tablet for patient. Please call [employer] and advise.

## 2017-03-24 NOTE — Telephone Encounter (Signed)
[facility] and gave verbal for this medications

## 2017-03-24 NOTE — Telephone Encounter (Signed)
Called [employer], gave verbal for the metformin. No other issues.

## 2017-03-27 DIAGNOSIS — Z96651 Presence of right artificial knee joint: Secondary | ICD-10-CM | POA: Diagnosis not present

## 2017-03-27 DIAGNOSIS — M25661 Stiffness of right knee, not elsewhere classified: Secondary | ICD-10-CM | POA: Diagnosis not present

## 2017-03-30 DIAGNOSIS — Z96651 Presence of right artificial knee joint: Secondary | ICD-10-CM | POA: Diagnosis not present

## 2017-03-30 DIAGNOSIS — M25661 Stiffness of right knee, not elsewhere classified: Secondary | ICD-10-CM | POA: Diagnosis not present

## 2017-03-31 ENCOUNTER — Encounter: Payer: Self-pay | Admitting: Family Medicine

## 2017-03-31 ENCOUNTER — Ambulatory Visit (INDEPENDENT_AMBULATORY_CARE_PROVIDER_SITE_OTHER): Payer: Medicare Other | Admitting: Family Medicine

## 2017-03-31 VITALS — BP 118/58 | HR 77 | Temp 97.8°F | Ht 62.0 in | Wt 205.0 lb

## 2017-03-31 DIAGNOSIS — R5383 Other fatigue: Secondary | ICD-10-CM

## 2017-03-31 DIAGNOSIS — D500 Iron deficiency anemia secondary to blood loss (chronic): Secondary | ICD-10-CM | POA: Diagnosis not present

## 2017-03-31 DIAGNOSIS — R300 Dysuria: Secondary | ICD-10-CM | POA: Diagnosis not present

## 2017-03-31 DIAGNOSIS — E1169 Type 2 diabetes mellitus with other specified complication: Secondary | ICD-10-CM | POA: Diagnosis not present

## 2017-03-31 LAB — CBC WITH DIFFERENTIAL/PLATELET
BASOS ABS: 0.1 10*3/uL (ref 0.0–0.1)
Basophils Relative: 0.8 % (ref 0.0–3.0)
EOS ABS: 0.2 10*3/uL (ref 0.0–0.7)
Eosinophils Relative: 2.6 % (ref 0.0–5.0)
HCT: 35.6 % — ABNORMAL LOW (ref 36.0–46.0)
Hemoglobin: 11.8 g/dL — ABNORMAL LOW (ref 12.0–15.0)
LYMPHS ABS: 1.2 10*3/uL (ref 0.7–4.0)
Lymphocytes Relative: 17.8 % (ref 12.0–46.0)
MCHC: 33 g/dL (ref 30.0–36.0)
MCV: 89.4 fl (ref 78.0–100.0)
MONO ABS: 0.4 10*3/uL (ref 0.1–1.0)
Monocytes Relative: 5.4 % (ref 3.0–12.0)
NEUTROS PCT: 73.4 % (ref 43.0–77.0)
Neutro Abs: 5 10*3/uL (ref 1.4–7.7)
Platelets: 263 10*3/uL (ref 150.0–400.0)
RBC: 3.99 Mil/uL (ref 3.87–5.11)
RDW: 15.4 % (ref 11.5–15.5)
WBC: 6.8 10*3/uL (ref 4.0–10.5)

## 2017-03-31 LAB — COMPREHENSIVE METABOLIC PANEL
ALK PHOS: 93 U/L (ref 39–117)
ALT: 8 U/L (ref 0–35)
AST: 12 U/L (ref 0–37)
Albumin: 3.9 g/dL (ref 3.5–5.2)
BILIRUBIN TOTAL: 0.3 mg/dL (ref 0.2–1.2)
BUN: 22 mg/dL (ref 6–23)
CO2: 28 mEq/L (ref 19–32)
CREATININE: 1.21 mg/dL — AB (ref 0.40–1.20)
Calcium: 9.7 mg/dL (ref 8.4–10.5)
Chloride: 102 mEq/L (ref 96–112)
GFR: 46.81 mL/min — ABNORMAL LOW (ref >60.00)
GLUCOSE: 213 mg/dL — AB (ref 70–99)
POTASSIUM: 4 meq/L (ref 3.5–5.1)
SODIUM: 137 meq/L (ref 135–145)
TOTAL PROTEIN: 7 g/dL (ref 6.0–8.3)

## 2017-03-31 LAB — POCT URINALYSIS DIPSTICK
BILIRUBIN UA: NEGATIVE
Glucose, UA: NEGATIVE
Ketones, UA: NEGATIVE
Leukocytes, UA: NEGATIVE
NITRITE UA: NEGATIVE
PH UA: 6 (ref 5.0–8.0)
PROTEIN UA: NEGATIVE
RBC UA: NEGATIVE
Spec Grav, UA: >=1.03 — AB (ref 1.010–1.025)
UROBILINOGEN UA: 0.2 U/dL

## 2017-03-31 LAB — IBC PANEL
Iron: 117 ug/dL (ref 42–145)
Saturation Ratios: 36 % (ref 20.0–50.0)
Transferrin: 232 mg/dL (ref 212.0–360.0)

## 2017-03-31 LAB — TSH: TSH: 1.8 u[IU]/mL (ref 0.35–4.50)

## 2017-03-31 LAB — HEMOGLOBIN A1C: Hgb A1c MFr Bld: 7.4 % — ABNORMAL HIGH (ref 4.6–6.5)

## 2017-03-31 LAB — FERRITIN: Ferritin: 52.5 ng/mL (ref 10.0–291.0)

## 2017-03-31 NOTE — Progress Notes (Signed)
Patient ID: Bethany Miller, female    DOB: 1945/09/07  Age: 71 y.o. MRN: [id number]    Subjective:  Subjective  HPI Bethany Miller presents for c/o fatigue.  She was anemic in the hospital and has not had her hgb rechecked.  She c/o extreme fatigue.    Review of Systems  Constitutional: Positive for fatigue. Negative for appetite change, diaphoresis and unexpected weight change.  Eyes: Negative for pain, redness and visual disturbance.  Respiratory: Negative for cough, chest tightness, shortness of breath and wheezing.   Cardiovascular: Negative for chest pain, palpitations and leg swelling.  Endocrine: Negative for cold intolerance, heat intolerance, polydipsia, polyphagia and polyuria.  Genitourinary: Negative for difficulty urinating, dysuria and frequency.  Neurological: Negative for dizziness, light-headedness, numbness and headaches.    History Past Medical History:  Diagnosis Date  . Diabetes mellitus   . Hyperlipidemia   . Hypertension   . 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   Her family history includes Alcohol abuse in her father; Arthritis in her mother, sister, and unknown relative; Cancer in her brother and brother; Cancer (age of onset: 48) in her brother; Cancer (age of onset: 41) in her brother; Cirrhosis in her father; Heart disease in her brother and brother; Hyperlipidemia in her brother and brother; Hypertension in her brother and sister; Melanoma in her unknown relative; Throat cancer in her brother.She reports that she has never smoked. She has never used smokeless tobacco. She reports that she drinks alcohol. She reports that she does not use drugs.  Current Outpatient Prescriptions on File Prior to Visit  Medication Sig Dispense Refill  . acetaminophen (TYLENOL) 650 MG CR tablet Take 650 mg by mouth  every 8 (eight) hours as needed for pain.    Marland Kitchen albuterol (PROVENTIL HFA;VENTOLIN HFA) 108 (90 Base) MCG/ACT inhaler Inhale 2 puffs into the lungs every 6 (six) hours as needed for wheezing or shortness of breath. 1 Inhaler 0  . atorvastatin (LIPITOR) 40 MG tablet TAKE 1 TABLET EVERY DAY 90 tablet 1  . citalopram (CELEXA) 40 MG tablet TAKE 1 TABLET (40 MG TOTAL) BY MOUTH DAILY. 90 tablet 1  . docusate sodium (COLACE) 100 MG capsule Take 1 capsule (100 mg total) by mouth 2 (two) times daily as needed for mild constipation. 30 capsule 1  . FreeStyle Unistick II Lancets MISC by Does not apply route.      Marland Kitchen glipiZIDE (GLUCOTROL) 5 MG tablet Take 1 tablet (5 mg total) by mouth 2 (two) times daily before a meal. 30 tablet 1  . Glucosamine-Chondroitin (MOVE FREE PO) Take 1 tablet by mouth daily.     Marland Kitchen glucose blood (FREESTYLE INSULINX TEST) test strip Check blood sugar once a day 300 each 3  . ibuprofen (ADVIL,MOTRIN) 800 MG tablet Take 800 mg by mouth 3 (three) times daily as needed for moderate pain.    Marland Kitchen lisinopril-hydrochlorothiazide (PRINZIDE,ZESTORETIC) 10-12.5 MG tablet TAKE 1 TABLET BY MOUTH EVERY DAY 90 tablet 1  . Loratadine-Pseudoephedrine (CLARITIN-D 12 HOUR PO) Take 1 tablet by mouth daily as needed (sinus).     . Melatonin 3 MG TABS Take 3 mg by mouth at bedtime as needed (sleep).    . metFORMIN (GLUCOPHAGE-XR) 500 MG 24 hr tablet Take 2 tablets (1,000 mg total) by mouth 2 (two) times daily after a meal. 360 tablet 3  . Multiple Vitamins-Minerals (MULTIVITAMIN ADULT  PO) Take 1 tablet by mouth daily.     Marland Kitchen omeprazole (PRILOSEC) 20 MG capsule Take 1 capsule (20 mg total) by mouth daily. 90 capsule 1  . polyethylene glycol (MIRALAX / GLYCOLAX) packet Take 17 g by mouth daily. 14 each 0  . Alogliptin Benzoate 25 MG TABS Take by mouth 25 mg daily before breakfast (Patient not taking: Reported on 02/12/2017) 30 tablet 11  . aspirin EC 325 MG tablet Take 1 tablet (325 mg total) by mouth 2 (two) times  daily after a meal. (Patient not taking: Reported on 03/31/2017) 60 tablet 1  . oxyCODONE-acetaminophen (PERCOCET) 5-325 MG tablet Take 1-2 tablets by mouth every 4 (four) hours as needed for severe pain. (Patient not taking: Reported on 03/31/2017) 40 tablet 0   No current facility-administered medications on file prior to visit.      Objective:  Objective  Physical Exam  Constitutional: She is oriented to person, place, and time. She appears well-developed and well-nourished.  HENT:  Head: Normocephalic and atraumatic.  Eyes: Conjunctivae and EOM are normal.  Neck: Normal range of motion. Neck supple. No JVD present. Carotid bruit is not present. No thyromegaly present.  Cardiovascular: Normal rate and regular rhythm.   Murmur heard. Pulmonary/Chest: Effort normal and breath sounds normal. No respiratory distress. She has no wheezes. She has no rales. She exhibits no tenderness.  Musculoskeletal: She exhibits no edema.  Neurological: She is alert and oriented to person, place, and time.  Psychiatric: She has a normal mood and affect.  Nursing note and vitals reviewed.  BP (!) 118/58 (BP Location: Right Arm, Patient Position: Sitting, Cuff Size: Normal)   Pulse 77   Temp 97.8 F (36.6 C) (Oral)   Ht 5\' 2"  (1.575 m)   Wt 205 lb (93 kg)   SpO2 94%   BMI 37.49 kg/m  Wt Readings from Last 3 Encounters:  03/31/17 205 lb (93 kg)  02/26/17 208 lb (94.3 kg)  02/17/17 208 lb (94.3 kg)     Lab Results  Component Value Date   WBC 8.7 03/01/2017   HGB 8.7 (L) 03/01/2017   HCT 25.2 (L) 03/01/2017   PLT 197 03/01/2017   GLUCOSE 267 (H) 02/27/2017   CHOL 155 11/11/2016   TRIG 170.0 (H) 11/11/2016   HDL 41.50 11/11/2016   LDLDIRECT 149.6 01/26/2013   LDLCALC 80 11/11/2016   ALT 11 11/11/2016   AST 12 11/11/2016   NA 129 (L) 02/27/2017   K 5.2 (H) 02/27/2017   CL 97 (L) 02/27/2017   CREATININE 1.04 (H) 02/27/2017   BUN 17 02/27/2017   CO2 24 02/27/2017   TSH 1.56 03/18/2011    INR 0.96 02/17/2017   HGBA1C 8.5 (H) 02/17/2017   MICROALBUR 0.6 03/28/2016    No results found.   Assessment & Plan:  Plan  I am having Ms. Bolander maintain her FREESTYLE UNISTICK II LANCETS, glucose blood, Multiple Vitamins-Minerals (MULTIVITAMIN ADULT PO), Glucosamine-Chondroitin (MOVE FREE PO), metFORMIN, glipiZIDE, atorvastatin, Loratadine-Pseudoephedrine (CLARITIN-D 12 HOUR PO), omeprazole, albuterol, Alogliptin Benzoate, lisinopril-hydrochlorothiazide, citalopram, ibuprofen, Melatonin, acetaminophen, oxyCODONE-acetaminophen, docusate sodium, polyethylene glycol, and aspirin EC.  No orders of the defined types were placed in this encounter.   Problem List Items Addressed This Visit    None    Visit Diagnoses    Anemia, blood loss    -  Primary   Relevant Orders   TSH   CBC with Differential/Platelet   Comprehensive metabolic panel   IBC panel   Ferritin  Dysuria       Relevant Orders   POCT Urinalysis Dipstick (Completed)   Other fatigue       Relevant Orders   TSH   CBC with Differential/Platelet   Comprehensive metabolic panel   IBC panel   Ferritin   Type 2 diabetes mellitus with other specified complication, without long-term current use of insulin (HCC)       Relevant Orders   Hemoglobin A1c      Follow-up: Return in about 3 months (around 06/30/2017), or if symptoms worsen or fail to improve.  Ann Held, DO

## 2017-03-31 NOTE — Patient Instructions (Signed)
Anemia, Nonspecific Anemia is a condition in which the concentration of red blood cells or hemoglobin in the blood is below normal. Hemoglobin is a substance in red blood cells that carries oxygen to the tissues of the body. Anemia results in not enough oxygen reaching these tissues. What are the causes? Common causes of anemia include:  Excessive bleeding. Bleeding may be internal or external. This includes excessive bleeding from periods (in women) or from the intestine.  Poor nutrition.  Chronic kidney, thyroid, and liver disease.  Bone marrow disorders that decrease red blood cell production.  Cancer and treatments for cancer.  HIV, AIDS, and their treatments.  Spleen problems that increase red blood cell destruction.  Blood disorders.  Excess destruction of red blood cells due to infection, medicines, and autoimmune disorders. What are the signs or symptoms?  Minor weakness.  Dizziness.  Headache.  Palpitations.  Shortness of breath, especially with exercise.  Paleness.  Cold sensitivity.  Indigestion.  Nausea.  Difficulty sleeping.  Difficulty concentrating. Symptoms may occur suddenly or they may develop slowly. How is this diagnosed? Additional blood tests are often needed. These help your health care provider determine the best treatment. Your health care provider will check your stool for blood and look for other causes of blood loss. How is this treated? Treatment varies depending on the cause of the anemia. Treatment can include:  Supplements of iron, vitamin B12, or folic acid.  Hormone medicines.  A blood transfusion. This may be needed if blood loss is severe.  Hospitalization. This may be needed if there is significant continual blood loss.  Dietary changes.  Spleen removal. Follow these instructions at home: Keep all follow-up appointments. It often takes many weeks to correct anemia, and having your health care provider check on your  condition and your response to treatment is very important. Get help right away if:  You develop extreme weakness, shortness of breath, or chest pain.  You become dizzy or have trouble concentrating.  You develop heavy vaginal bleeding.  You develop a rash.  You have bloody or black, tarry stools.  You faint.  You vomit up blood.  You vomit repeatedly.  You have abdominal pain.  You have a fever or persistent symptoms for more than 2-3 days.  You have a fever and your symptoms suddenly get worse.  You are dehydrated. This information is not intended to replace advice given to you by your health care provider. Make sure you discuss any questions you have with your health care provider. Document Released: 08/07/2004 Document Revised: 12/12/2015 Document Reviewed: 12/24/2012 Elsevier Interactive Patient Education  2017 [employer]  

## 2017-04-02 DIAGNOSIS — Z96651 Presence of right artificial knee joint: Secondary | ICD-10-CM | POA: Diagnosis not present

## 2017-04-02 DIAGNOSIS — M25661 Stiffness of right knee, not elsewhere classified: Secondary | ICD-10-CM | POA: Diagnosis not present

## 2017-04-06 DIAGNOSIS — Z96651 Presence of right artificial knee joint: Secondary | ICD-10-CM | POA: Diagnosis not present

## 2017-04-06 DIAGNOSIS — M25661 Stiffness of right knee, not elsewhere classified: Secondary | ICD-10-CM | POA: Diagnosis not present

## 2017-04-08 ENCOUNTER — Other Ambulatory Visit: Payer: Self-pay | Admitting: *Deleted

## 2017-04-08 ENCOUNTER — Other Ambulatory Visit: Payer: Medicare Other

## 2017-04-08 DIAGNOSIS — D500 Iron deficiency anemia secondary to blood loss (chronic): Secondary | ICD-10-CM

## 2017-04-08 DIAGNOSIS — R5383 Other fatigue: Secondary | ICD-10-CM

## 2017-04-09 ENCOUNTER — Other Ambulatory Visit (INDEPENDENT_AMBULATORY_CARE_PROVIDER_SITE_OTHER): Payer: Medicare Other

## 2017-04-09 DIAGNOSIS — R5383 Other fatigue: Secondary | ICD-10-CM

## 2017-04-09 DIAGNOSIS — D500 Iron deficiency anemia secondary to blood loss (chronic): Secondary | ICD-10-CM | POA: Diagnosis not present

## 2017-04-09 DIAGNOSIS — Z96651 Presence of right artificial knee joint: Secondary | ICD-10-CM | POA: Diagnosis not present

## 2017-04-09 DIAGNOSIS — Z471 Aftercare following joint replacement surgery: Secondary | ICD-10-CM | POA: Diagnosis not present

## 2017-04-09 LAB — FECAL OCCULT BLOOD, IMMUNOCHEMICAL: FECAL OCCULT BLD: NEGATIVE

## 2017-04-13 DIAGNOSIS — G8929 Other chronic pain: Secondary | ICD-10-CM | POA: Diagnosis not present

## 2017-04-13 DIAGNOSIS — R269 Unspecified abnormalities of gait and mobility: Secondary | ICD-10-CM | POA: Diagnosis not present

## 2017-04-13 DIAGNOSIS — Z96651 Presence of right artificial knee joint: Secondary | ICD-10-CM | POA: Diagnosis not present

## 2017-04-13 DIAGNOSIS — M25661 Stiffness of right knee, not elsewhere classified: Secondary | ICD-10-CM | POA: Diagnosis not present

## 2017-04-20 ENCOUNTER — Encounter: Payer: Self-pay | Admitting: Family Medicine

## 2017-04-20 DIAGNOSIS — R269 Unspecified abnormalities of gait and mobility: Secondary | ICD-10-CM | POA: Diagnosis not present

## 2017-04-20 DIAGNOSIS — M25661 Stiffness of right knee, not elsewhere classified: Secondary | ICD-10-CM | POA: Diagnosis not present

## 2017-04-20 DIAGNOSIS — Z96651 Presence of right artificial knee joint: Secondary | ICD-10-CM | POA: Diagnosis not present

## 2017-04-20 DIAGNOSIS — G8929 Other chronic pain: Secondary | ICD-10-CM | POA: Diagnosis not present

## 2017-04-21 ENCOUNTER — Encounter: Payer: Self-pay | Admitting: Family Medicine

## 2017-04-21 ENCOUNTER — Emergency Department (HOSPITAL_BASED_OUTPATIENT_CLINIC_OR_DEPARTMENT_OTHER): Payer: Medicare Other

## 2017-04-21 ENCOUNTER — Telehealth: Payer: Self-pay | Admitting: Internal Medicine

## 2017-04-21 ENCOUNTER — Ambulatory Visit (HOSPITAL_BASED_OUTPATIENT_CLINIC_OR_DEPARTMENT_OTHER)
Admission: RE | Admit: 2017-04-21 | Discharge: 2017-04-21 | Disposition: A | Discharge status: Home / Self Care | Payer: Medicare Other | Source: Ambulatory Visit | Attending: Family Medicine | Admitting: Family Medicine

## 2017-04-21 ENCOUNTER — Emergency Department (HOSPITAL_BASED_OUTPATIENT_CLINIC_OR_DEPARTMENT_OTHER)
Admission: EM | Admit: 2017-04-21 | Discharge: 2017-04-22 | Disposition: A | Discharge status: Home / Self Care | Payer: Medicare Other | Attending: Emergency Medicine | Admitting: Emergency Medicine

## 2017-04-21 ENCOUNTER — Other Ambulatory Visit: Payer: Self-pay

## 2017-04-21 ENCOUNTER — Ambulatory Visit (INDEPENDENT_AMBULATORY_CARE_PROVIDER_SITE_OTHER): Payer: Medicare Other | Admitting: Family Medicine

## 2017-04-21 ENCOUNTER — Encounter (HOSPITAL_BASED_OUTPATIENT_CLINIC_OR_DEPARTMENT_OTHER): Payer: Self-pay | Admitting: Respiratory Therapy

## 2017-04-21 VITALS — BP 100/58 | HR 85 | Temp 98.2°F | Resp 16 | Ht 62.0 in | Wt 200.0 lb

## 2017-04-21 DIAGNOSIS — E1165 Type 2 diabetes mellitus with hyperglycemia: Secondary | ICD-10-CM | POA: Insufficient documentation

## 2017-04-21 DIAGNOSIS — M791 Myalgia, unspecified site: Secondary | ICD-10-CM | POA: Diagnosis not present

## 2017-04-21 DIAGNOSIS — R0602 Shortness of breath: Secondary | ICD-10-CM

## 2017-04-21 DIAGNOSIS — E119 Type 2 diabetes mellitus without complications: Secondary | ICD-10-CM

## 2017-04-21 DIAGNOSIS — Z79899 Other long term (current) drug therapy: Secondary | ICD-10-CM | POA: Diagnosis not present

## 2017-04-21 DIAGNOSIS — I10 Essential (primary) hypertension: Secondary | ICD-10-CM | POA: Insufficient documentation

## 2017-04-21 DIAGNOSIS — R531 Weakness: Secondary | ICD-10-CM

## 2017-04-21 DIAGNOSIS — D649 Anemia, unspecified: Secondary | ICD-10-CM | POA: Diagnosis not present

## 2017-04-21 DIAGNOSIS — R5383 Other fatigue: Secondary | ICD-10-CM

## 2017-04-21 DIAGNOSIS — Z7984 Long term (current) use of oral hypoglycemic drugs: Secondary | ICD-10-CM | POA: Diagnosis not present

## 2017-04-21 DIAGNOSIS — R799 Abnormal finding of blood chemistry, unspecified: Secondary | ICD-10-CM | POA: Diagnosis present

## 2017-04-21 DIAGNOSIS — Z7982 Long term (current) use of aspirin: Secondary | ICD-10-CM | POA: Diagnosis not present

## 2017-04-21 DIAGNOSIS — R739 Hyperglycemia, unspecified: Secondary | ICD-10-CM

## 2017-04-21 DIAGNOSIS — R7989 Other specified abnormal findings of blood chemistry: Secondary | ICD-10-CM | POA: Diagnosis not present

## 2017-04-21 LAB — CBC
HEMATOCRIT: 36.1 % (ref 36.0–46.0)
HEMOGLOBIN: 11.9 g/dL — AB (ref 12.0–15.0)
MCH: 28.7 pg (ref 26.0–34.0)
MCHC: 33 g/dL (ref 30.0–36.0)
MCV: 87 fL (ref 78.0–100.0)
Platelets: 288 10*3/uL (ref 150–400)
RBC: 4.15 MIL/uL (ref 3.87–5.11)
RDW: 13.9 % (ref 11.5–15.5)
WBC: 9.6 10*3/uL (ref 4.0–10.5)

## 2017-04-21 LAB — BASIC METABOLIC PANEL
ANION GAP: 9 (ref 5–15)
BUN: 27 mg/dL — ABNORMAL HIGH (ref 6–20)
CHLORIDE: 103 mmol/L (ref 101–111)
CO2: 24 mmol/L (ref 22–32)
Calcium: 9.2 mg/dL (ref 8.9–10.3)
Creatinine, Ser: 1.21 mg/dL — ABNORMAL HIGH (ref 0.44–1.00)
GFR calc non Af Amer: 45 mL/min — ABNORMAL LOW (ref >60)
GFR, EST AFRICAN AMERICAN: 52 mL/min — AB (ref >60)
Glucose, Bld: 232 mg/dL — ABNORMAL HIGH (ref 65–99)
POTASSIUM: 4.1 mmol/L (ref 3.5–5.1)
SODIUM: 136 mmol/L (ref 135–145)

## 2017-04-21 LAB — D-DIMER, QUANTITATIVE: D-Dimer, Quant: 1.06 mcg/mL FEU — ABNORMAL HIGH (ref <0.50)

## 2017-04-21 LAB — TROPONIN I: Troponin I: <0.03 ng/mL (ref <0.03)

## 2017-04-21 LAB — BRAIN NATRIURETIC PEPTIDE: B NATRIURETIC PEPTIDE 5: 14.5 pg/mL (ref 0.0–100.0)

## 2017-04-21 MED ORDER — IOPAMIDOL (ISOVUE-370) INJECTION 76%
80.0000 mL | Freq: Once | INTRAVENOUS | Status: AC | PRN
  Administered 2017-04-21: 100 mL via INTRAVENOUS

## 2017-04-21 NOTE — Progress Notes (Signed)
Patient ID: Bethany Miller, female    DOB: June 30, 1946  Age: 71 y.o. MRN: [id number]    Subjective:  Subjective  HPI Bethany Miller presents for sob with exertion over last several weeks.  No chest pain , no palpitations  Review of Systems  Constitutional: Negative for appetite change, diaphoresis, fatigue and unexpected weight change.  Eyes: Negative for pain, redness and visual disturbance.  Respiratory: Positive for shortness of breath. Negative for cough, chest tightness and wheezing.   Cardiovascular: Negative for chest pain, palpitations and leg swelling.  Endocrine: Negative for cold intolerance, heat intolerance, polydipsia, polyphagia and polyuria.  Genitourinary: Negative for difficulty urinating, dysuria and frequency.  Neurological: Negative for dizziness, light-headedness, numbness and headaches.    History Past Medical History:  Diagnosis Date  . Diabetes mellitus   . Hyperlipidemia   . Hypertension   . 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   Her family history includes Alcohol abuse in her father; Arthritis in her mother, sister, and unknown relative; Cancer in her brother and brother; Cancer (age of onset: 30) in her brother; Cancer (age of onset: 88) in her brother; Cirrhosis in her father; Heart disease in her brother and brother; Hyperlipidemia in her brother and brother; Hypertension in her brother and sister; Melanoma in her unknown relative; Throat cancer in her brother.She reports that she has never smoked. She has never used smokeless tobacco. She reports that she drinks alcohol. She reports that she does not use drugs.  Current Outpatient Prescriptions on File Prior to Visit  Medication Sig Dispense Refill  . acetaminophen (TYLENOL) 650 MG CR tablet Take 650 mg by mouth every 8 (eight) hours as needed for  pain.    Marland Kitchen albuterol (PROVENTIL HFA;VENTOLIN HFA) 108 (90 Base) MCG/ACT inhaler Inhale 2 puffs into the lungs every 6 (six) hours as needed for wheezing or shortness of breath. 1 Inhaler 0  . atorvastatin (LIPITOR) 40 MG tablet TAKE 1 TABLET EVERY DAY 90 tablet 1  . citalopram (CELEXA) 40 MG tablet TAKE 1 TABLET (40 MG TOTAL) BY MOUTH DAILY. 90 tablet 1  . docusate sodium (COLACE) 100 MG capsule Take 1 capsule (100 mg total) by mouth 2 (two) times daily as needed for mild constipation. 30 capsule 1  . FreeStyle Unistick II Lancets MISC by Does not apply route.      Marland Kitchen glipiZIDE (GLUCOTROL) 5 MG tablet Take 1 tablet (5 mg total) by mouth 2 (two) times daily before a meal. 30 tablet 1  . Glucosamine-Chondroitin (MOVE FREE PO) Take 1 tablet by mouth daily.     Marland Kitchen glucose blood (FREESTYLE INSULINX TEST) test strip Check blood sugar once a day 300 each 3  . ibuprofen (ADVIL,MOTRIN) 800 MG tablet Take 800 mg by mouth 3 (three) times daily as needed for moderate pain.    Marland Kitchen lisinopril-hydrochlorothiazide (PRINZIDE,ZESTORETIC) 10-12.5 MG tablet TAKE 1 TABLET BY MOUTH EVERY DAY 90 tablet 1  . Loratadine-Pseudoephedrine (CLARITIN-D 12 HOUR PO) Take 1 tablet by mouth daily as needed (sinus).     . Melatonin 3 MG TABS Take 3 mg by mouth at bedtime as needed (sleep).    . metFORMIN (GLUCOPHAGE-XR) 500 MG 24 hr tablet Take 2 tablets (1,000 mg total) by mouth 2 (two) times daily after a meal. 360 tablet 3  . Multiple Vitamins-Minerals (MULTIVITAMIN ADULT PO) Take 1 tablet by mouth daily.     Marland Kitchen  omeprazole (PRILOSEC) 20 MG capsule Take 1 capsule (20 mg total) by mouth daily. 90 capsule 1   No current facility-administered medications on file prior to visit.      Objective:  Objective  Physical Exam  Constitutional: She is oriented to person, place, and time. She appears well-developed and well-nourished.  HENT:  Head: Normocephalic and atraumatic.  Eyes: Conjunctivae and EOM are normal.  Neck: Normal range of  motion. Neck supple. No JVD present. Carotid bruit is not present. No thyromegaly present.  Cardiovascular: Normal rate, regular rhythm and normal heart sounds.   No murmur heard. Pulmonary/Chest: Effort normal and breath sounds normal. No respiratory distress. She has no wheezes. She has no rales. She exhibits no tenderness.  Musculoskeletal: She exhibits no edema.  Neurological: She is alert and oriented to person, place, and time.  Psychiatric: She has a normal mood and affect.  Nursing note and vitals reviewed.  BP (!) 100/58 (BP Location: Left Arm, Patient Position: Sitting, Cuff Size: Normal)   Pulse 85   Temp 98.2 F (36.8 C) (Oral)   Resp 16   Ht 5\' 2"  (1.575 m)   Wt 200 lb (90.7 kg)   SpO2 96%   BMI 36.58 kg/m  Wt Readings from Last 3 Encounters:  04/21/17 200 lb (90.7 kg)  03/31/17 205 lb (93 kg)  02/26/17 208 lb (94.3 kg)     Lab Results  Component Value Date   WBC 6.8 03/31/2017   HGB 11.8 (L) 03/31/2017   HCT 35.6 (L) 03/31/2017   PLT 263.0 03/31/2017   GLUCOSE 213 (H) 03/31/2017   CHOL 155 11/11/2016   TRIG 170.0 (H) 11/11/2016   HDL 41.50 11/11/2016   LDLDIRECT 149.6 01/26/2013   LDLCALC 80 11/11/2016   ALT 8 03/31/2017   AST 12 03/31/2017   NA 137 03/31/2017   K 4.0 03/31/2017   CL 102 03/31/2017   CREATININE 1.21 (H) 03/31/2017   BUN 22 03/31/2017   CO2 28 03/31/2017   TSH 1.80 03/31/2017   INR 0.96 02/17/2017   HGBA1C 7.4 (H) 03/31/2017   MICROALBUR 0.6 03/28/2016   ekg--  nsr  No results found.   Assessment & Plan:  Plan  I have discontinued Ms. Corradi's Alogliptin Benzoate, oxyCODONE-acetaminophen, and polyethylene glycol. I am also having her maintain her FREESTYLE UNISTICK II LANCETS, glucose blood, Multiple Vitamins-Minerals (MULTIVITAMIN ADULT PO), Glucosamine-Chondroitin (MOVE FREE PO), metFORMIN, glipiZIDE, atorvastatin, Loratadine-Pseudoephedrine (CLARITIN-D 12 HOUR PO), omeprazole, albuterol, lisinopril-hydrochlorothiazide,  citalopram, ibuprofen, Melatonin, acetaminophen, docusate sodium, and aspirin EC.  Meds ordered this encounter  Medications  . aspirin EC 81 MG tablet    Sig: Take 81 mg by mouth daily.    Problem List Items Addressed This Visit    None    Visit Diagnoses    SOB (shortness of breath) on exertion    -  Primary   Relevant Orders   EKG 12-Lead (Completed)   ECHOCARDIOGRAM COMPLETE   CBC with Differential/Platelet   Comprehensive metabolic panel   TSH   D-Dimer, Quantitative   DG Chest 2 View   Fatigue, unspecified type       Relevant Orders   CBC with Differential/Platelet   Comprehensive metabolic panel   TSH   Myalgia       Relevant Orders   CBC with Differential/Platelet   Comprehensive metabolic panel   TSH   Anemia, unspecified type       Relevant Orders   CBC with Differential/Platelet   Comprehensive metabolic panel  TSH   Controlled type 2 diabetes mellitus without complication, without long-term current use of insulin (HCC)       Relevant Medications   aspirin EC 81 MG tablet   Other Relevant Orders   Comprehensive metabolic panel   Hemoglobin A1c   TSH    if symptoms worsen -- go to ER  Follow-up: Return in about 3 months (around 07/22/2017).  Ann Held, DO

## 2017-04-21 NOTE — Patient Instructions (Signed)
Shortness of Breath, Adult  Shortness of breath means you have trouble breathing. Your lungs are organs for breathing.  Follow these instructions at home:  Pay attention to any changes in your symptoms. Take these actions to help with your condition:  ? Do not smoke. Smoking can cause shortness of breath. If you need help to quit smoking, ask your doctor.  ? Avoid things that can make it harder to breathe, such as:  ? Mold.  ? Dust.  ? Air pollution.  ? Chemical smells.  ? Things that can cause allergy symptoms (allergens), if you have allergies.  ? Keep your living space clean and free of mold and dust.  ? Rest as needed. Slowly return to your usual activities.  ? Take over-the-counter and prescription medicines, including oxygen and inhaled medicines, only as told by your doctor.  ? Keep all follow-up visits as told by your doctor. This is important.  Contact a doctor if:  ? Your condition does not get better as soon as expected.  ? You have a hard time doing your normal activities, even after you rest.  ? You have new symptoms.  Get help right away if:  ? You have trouble breathing when you are resting.  ? You feel light-headed or you faint.  ? You have a cough that is not helped by medicines.  ? You cough up blood.  ? You have pain with breathing.  ? You have pain in your chest, arms, shoulders, or belly (abdomen).  ? You have a fever.  ? You cannot walk up stairs.  ? You cannot exercise the way you normally do.  This information is not intended to replace advice given to you by your health care provider. Make sure you discuss any questions you have with your health care provider.  Document Released: 12/17/2007 Document Revised: 07/17/2016 Document Reviewed: 07/17/2016  Elsevier Interactive Patient Education ? 2017 [employer]

## 2017-04-21 NOTE — ED Triage Notes (Signed)
She was seen by her MD today for SOB. She was called tonight and told her that her D dimer is elevated.

## 2017-04-21 NOTE — Telephone Encounter (Signed)
Patient called regarding positive D Dimer in the setting of recent knee replacement and obesity  And dyspnea with minimal exertion.  Advised to go to ER for CT scan to rule out PE

## 2017-04-21 NOTE — ED Notes (Signed)
ED Provider at bedside. 

## 2017-04-21 NOTE — ED Provider Notes (Addendum)
[facility] Provider Note: Georgena Spurling, MD, FACEP  CSN: [id number] MRN: [id number] ARRIVAL: 04/21/17 at 2121 ROOM: [place]  Abnormal Lab   HISTORY OF PRESENT ILLNESS  04/21/17 11:42 PM Bethany Miller is a 71 y.o. female who had a right knee replacement August 16. Since her surgery she has had general malaise, weakness and dyspnea on exertion. Her knee has healed well and is not causing her any significant pain. She was seen by her primary care physician earlier today who did lab work including a d-dimer. The d-dimer was found to be elevated at 1.06 and she was told to come to the ED for a CT angiogram of the chest. She denies chest pain, vomiting or diarrhea. She has had nausea off and on for the past week. She was anemic postoperatively but has been treated with iron with improvement in her blood counts.  Past Medical History:  Diagnosis Date  . Diabetes mellitus   . Hyperlipidemia   . Hypertension   . Murmur     Past Surgical History:  Procedure Laterality Date  . ABDOMINAL HYSTERECTOMY    . BACK SURGERY    . CHOLECYSTECTOMY    . TONSILLECTOMY    . TOTAL KNEE ARTHROPLASTY    . TOTAL KNEE ARTHROPLASTY Right 02/26/2017   Procedure: RIGHT TOTAL KNEE ARTHROPLASTY;  Surgeon: Susa Day, MD;  Location: [facility];  Service: Orthopedics;  Laterality: Right;    Family History  Problem Relation Age of Onset  . Throat cancer Brother   . Heart disease Brother        MI  . Cancer Brother 80       throat,   . Hyperlipidemia Brother   . Cirrhosis Father   . Alcohol abuse Father   . Heart disease Brother   . Cancer Brother   . Arthritis Mother   . Hypertension Sister   . Arthritis Sister   . Cancer Brother   . Cancer Brother 40       melanoma  . Hypertension Brother   . Hyperlipidemia Brother   . Melanoma Unknown   . Arthritis Unknown     Social History  Substance Use Topics  . Smoking status: Never Smoker  . Smokeless tobacco:  Never Used  . Alcohol use Yes     Comment: rare    Prior to Admission medications   Medication Sig Start Date End Date Taking? Authorizing Provider  acetaminophen (TYLENOL) 650 MG CR tablet Take 650 mg by mouth every 8 (eight) hours as needed for pain.    [provider]  albuterol (PROVENTIL HFA;VENTOLIN HFA) 108 (90 Base) MCG/ACT inhaler Inhale 2 puffs into the lungs every 6 (six) hours as needed for wheezing or shortness of breath. 10/30/16   Saguier, Percell Miller, PA-C  aspirin EC 81 MG tablet Take 81 mg by mouth daily.    [provider]  atorvastatin (LIPITOR) 40 MG tablet TAKE 1 TABLET EVERY DAY 07/28/16   Carollee Herter, Alferd Apa, DO  citalopram (CELEXA) 40 MG tablet TAKE 1 TABLET (40 MG TOTAL) BY MOUTH DAILY. 01/09/17   Ann Held, DO  docusate sodium (COLACE) 100 MG capsule Take 1 capsule (100 mg total) by mouth 2 (two) times daily as needed for mild constipation. 02/26/17   Susa Day, MD  FreeStyle Unistick II Lancets MISC by Does not apply route.      [provider]  glipiZIDE (GLUCOTROL) 5 MG tablet Take 1 tablet (  5 mg total) by mouth 2 (two) times daily before a meal. 07/03/16   Philemon Kingdom, MD  Glucosamine-Chondroitin (MOVE FREE PO) Take 1 tablet by mouth daily.     [provider]  glucose blood (FREESTYLE INSULINX TEST) test strip Check blood sugar once a day 06/23/13   Philemon Kingdom, MD  ibuprofen (ADVIL,MOTRIN) 800 MG tablet Take 800 mg by mouth 3 (three) times daily as needed for moderate pain.    [provider]  lisinopril-hydrochlorothiazide (PRINZIDE,ZESTORETIC) 10-12.5 MG tablet TAKE 1 TABLET BY MOUTH EVERY DAY 01/09/17   Carollee Herter, Alferd Apa, DO  Loratadine-Pseudoephedrine (CLARITIN-D 12 HOUR PO) Take 1 tablet by mouth daily as needed (sinus).     [provider]  Melatonin 3 MG TABS Take 3 mg by mouth at bedtime as needed (sleep).    [provider]  metFORMIN (GLUCOPHAGE-XR) 500 MG 24 hr  tablet Take 2 tablets (1,000 mg total) by mouth 2 (two) times daily after a meal. 07/03/16   Philemon Kingdom, MD  Multiple Vitamins-Minerals (MULTIVITAMIN ADULT PO) Take 1 tablet by mouth daily.     [provider]  omeprazole (PRILOSEC) 20 MG capsule Take 1 capsule (20 mg total) by mouth daily. 08/28/16   Ann Held, DO    Allergies Patient has no known allergies.   REVIEW OF SYSTEMS  Negative except as noted here or in the History of Present Illness.   PHYSICAL EXAMINATION  Initial Vital Signs Blood pressure 129/60, pulse 82, temperature 98.2 F (36.8 C), temperature source Oral, resp. rate (!) 21, height 5\' 2"  (1.575 m), weight 90.7 kg (200 lb), SpO2 94 %.  Examination General: Well-developed, well-nourished female in no acute distress; appearance consistent with age of record HENT: normocephalic; atraumatic Eyes: pupils equal, round and reactive to light; extraocular muscles intact Neck: supple Heart: regular rate and rhythm Lungs: clear to auscultation bilaterally Abdomen: soft; nondistended; nontender; bowel sounds present Extremities: No deformity; pulses normal; well healing surgical incision of right knee without signs of infection Neurologic: Awake, alert and oriented; motor function intact in all extremities and symmetric; no facial droop Skin: Warm and dry Psychiatric: Normal mood and affect   RESULTS  Summary of this visit's results, reviewed by myself:  EKG Interpretation:  Date & Time: 04/21/2017 10:01 PM  Rate: 81  Rhythm: normal sinus rhythm and premature atrial contractions (PAC)  QRS Axis: normal  Intervals: normal  ST/T Wave abnormalities: normal  Conduction Disutrbances:none  Narrative Interpretation:   Old EKG Reviewed: PACs not seen previously  Laboratory Studies: Results for orders placed or performed during the hospital encounter of 04/21/17 (from the past 24 hour(s))  Basic metabolic panel     Status: Abnormal   Collection  Time: 04/21/17 10:03 PM  Result Value Ref Range   Sodium 136 135 - 145 mmol/L   Potassium 4.1 3.5 - 5.1 mmol/L   Chloride 103 101 - 111 mmol/L   CO2 24 22 - 32 mmol/L   Glucose, Bld 232 (H) 65 - 99 mg/dL   BUN 27 (H) 6 - 20 mg/dL   Creatinine, Ser 1.21 (H) 0.44 - 1.00 mg/dL   Calcium 9.2 8.9 - 10.3 mg/dL   GFR calc non Af Amer 45 (L) >60 mL/min   GFR calc Af Amer 52 (L) >60 mL/min   Anion gap 9 5 - 15  CBC     Status: Abnormal   Collection Time: 04/21/17 10:03 PM  Result Value Ref Range   WBC 9.6  4.0 - 10.5 K/uL   RBC 4.15 3.87 - 5.11 MIL/uL   Hemoglobin 11.9 (L) 12.0 - 15.0 g/dL   HCT 36.1 36.0 - 46.0 %   MCV 87.0 78.0 - 100.0 fL   MCH 28.7 26.0 - 34.0 pg   MCHC 33.0 30.0 - 36.0 g/dL   RDW 13.9 11.5 - 15.5 %   Platelets 288 150 - 400 K/uL  Troponin I     Status: None   Collection Time: 04/21/17 10:03 PM  Result Value Ref Range   Troponin I <0.03 <0.03 ng/mL  Brain natriuretic peptide     Status: None   Collection Time: 04/21/17 10:03 PM  Result Value Ref Range   B Natriuretic Peptide 14.5 0.0 - 100.0 pg/mL   Imaging Studies: Ct Angio Chest Pe W And/or Wo Contrast  Result Date: 04/21/2017 CLINICAL DATA:  Shortness of breath with exertion for last several weeks, elevated D-dimer, hypertension, heart murmur, suspected pulmonary embolism of intermediate probability, hypertension, diabetes mellitus EXAM: CT ANGIOGRAPHY CHEST WITH CONTRAST TECHNIQUE: Multidetector CT imaging of the chest was performed using the standard protocol during bolus administration of intravenous contrast. Multiplanar CT image reconstructions and MIPs were obtained to evaluate the vascular anatomy. CONTRAST:  80 cc Isovue 370 IV COMPARISON:  None FINDINGS: Cardiovascular: Upper normal caliber of ascending thoracic aorta 3.9 cm transverse image 42. Minimal atherosclerotic calcifications aorta and coronary arteries. No pericardial effusion. No aortic dissection. Pulmonary arteries adequately opacified and  patent. No evidence of pulmonary embolism. Mediastinum/Nodes: Esophagus unremarkable. Base of cervical region normal appearance. No thoracic adenopathy. Lungs/Pleura: Lungs clear. No pulmonary infiltrate, pleural effusion, or pneumothorax. 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 Post cholecystectomy. Remaining visualized upper abdomen unremarkable. Musculoskeletal: No acute osseous findings. Review of the MIP images confirms the above findings. IMPRESSION: No evidence of pulmonary embolism. Upper normal caliber ascending thoracic aorta 3.9 cm transverse with minimal coronary artery calcification. 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 Aortic Atherosclerosis (ICD10-I70.0). Electronically Signed   By: Lavonia Dana M.D.   On: 04/21/2017 23:50    ED COURSE  Nursing notes and initial vitals signs, including pulse oximetry, reviewed.  Vitals:   04/21/17 2135 04/21/17 2212 04/21/17 2300 04/22/17 0005  BP: (!) 141/65 129/60 123/61 124/67  Pulse: 86 82 79 72  Resp: 18 (!) 21  18  Temp: 98.2 F (36.8 C)     TempSrc: Oral     SpO2: 95% 94% 94% 96%  Weight: 90.7 kg (200 lb)     Height: 5\' 2"  (1.575 m)       PROCEDURES    ED DIAGNOSES     ICD-10-CM   1. Generalized weakness R53.1   2. Hyperglycemia R73.9        Shanon Rosser, MD 04/21/17 2355    Shanon Rosser, MD 04/22/17 0254

## 2017-04-21 NOTE — ED Notes (Signed)
Pt reports being seen by PCP for increased fatigued and SOB after exertion. Pt denies SOB at this time. Pt ambulated to room with a steady gate and denied SOB.

## 2017-04-22 ENCOUNTER — Telehealth: Payer: Self-pay | Admitting: *Deleted

## 2017-04-22 LAB — COMPREHENSIVE METABOLIC PANEL
ALT: 10 U/L (ref 0–35)
AST: 9 U/L (ref 0–37)
Albumin: 4.2 g/dL (ref 3.5–5.2)
Alkaline Phosphatase: 90 U/L (ref 39–117)
BUN: 23 mg/dL (ref 6–23)
CHLORIDE: 103 meq/L (ref 96–112)
CO2: 27 mEq/L (ref 19–32)
Calcium: 9.6 mg/dL (ref 8.4–10.5)
Creatinine, Ser: 1.1 mg/dL (ref 0.40–1.20)
GFR: 52.24 mL/min — ABNORMAL LOW (ref >60.00)
GLUCOSE: 204 mg/dL — AB (ref 70–99)
POTASSIUM: 4.7 meq/L (ref 3.5–5.1)
SODIUM: 137 meq/L (ref 135–145)
Total Bilirubin: 0.3 mg/dL (ref 0.2–1.2)
Total Protein: 7.2 g/dL (ref 6.0–8.3)

## 2017-04-22 LAB — CBC WITH DIFFERENTIAL/PLATELET
BASOS PCT: 1 % (ref 0.0–3.0)
Basophils Absolute: 0.1 10*3/uL (ref 0.0–0.1)
EOS PCT: 2.4 % (ref 0.0–5.0)
Eosinophils Absolute: 0.2 10*3/uL (ref 0.0–0.7)
HCT: 37.6 % (ref 36.0–46.0)
Hemoglobin: 12.3 g/dL (ref 12.0–15.0)
LYMPHS ABS: 2 10*3/uL (ref 0.7–4.0)
Lymphocytes Relative: 23.7 % (ref 12.0–46.0)
MCHC: 32.8 g/dL (ref 30.0–36.0)
MCV: 88.3 fl (ref 78.0–100.0)
MONO ABS: 0.6 10*3/uL (ref 0.1–1.0)
Monocytes Relative: 6.4 % (ref 3.0–12.0)
NEUTROS PCT: 66.5 % (ref 43.0–77.0)
Neutro Abs: 5.7 10*3/uL (ref 1.4–7.7)
Platelets: 292 10*3/uL (ref 150.0–400.0)
RBC: 4.26 Mil/uL (ref 3.87–5.11)
RDW: 15.4 % (ref 11.5–15.5)
WBC: 8.6 10*3/uL (ref 4.0–10.5)

## 2017-04-22 LAB — HEMOGLOBIN A1C: Hgb A1c MFr Bld: 7.6 % — ABNORMAL HIGH (ref 4.6–6.5)

## 2017-04-22 LAB — TSH: TSH: 5.42 u[IU]/mL — ABNORMAL HIGH (ref 0.35–4.50)

## 2017-04-22 NOTE — Telephone Encounter (Signed)
Received Abnormal lab results from [place], [facility] also, provider is aware; forwarded print to provider/SLS 10/10

## 2017-04-27 ENCOUNTER — Other Ambulatory Visit: Payer: Self-pay | Admitting: Family Medicine

## 2017-04-27 DIAGNOSIS — E039 Hypothyroidism, unspecified: Secondary | ICD-10-CM

## 2017-04-29 ENCOUNTER — Encounter: Payer: Self-pay | Admitting: Internal Medicine

## 2017-04-29 ENCOUNTER — Ambulatory Visit (INDEPENDENT_AMBULATORY_CARE_PROVIDER_SITE_OTHER): Payer: Medicare Other | Admitting: Internal Medicine

## 2017-04-29 VITALS — BP 132/74 | HR 81 | Wt 199.0 lb

## 2017-04-29 DIAGNOSIS — E1151 Type 2 diabetes mellitus with diabetic peripheral angiopathy without gangrene: Secondary | ICD-10-CM

## 2017-04-29 DIAGNOSIS — E1165 Type 2 diabetes mellitus with hyperglycemia: Secondary | ICD-10-CM

## 2017-04-29 DIAGNOSIS — Z23 Encounter for immunization: Secondary | ICD-10-CM

## 2017-04-29 DIAGNOSIS — E785 Hyperlipidemia, unspecified: Secondary | ICD-10-CM | POA: Diagnosis not present

## 2017-04-29 DIAGNOSIS — IMO0002 Reserved for concepts with insufficient information to code with codable children: Secondary | ICD-10-CM

## 2017-04-29 MED ORDER — GLIPIZIDE 5 MG PO TABS: 5.0000 mg | ORAL_TABLET | Freq: Two times a day (BID) | ORAL | 3 refills | Status: DC

## 2017-04-29 MED ORDER — METFORMIN HCL ER 500 MG PO TB24: 1000.0000 mg | ORAL_TABLET | Freq: Two times a day (BID) | ORAL | 3 refills | Status: DC

## 2017-04-29 MED ORDER — EMPAGLIFLOZIN 10 MG PO TABS: 10.0000 mg | ORAL_TABLET | Freq: Every day | ORAL | 5 refills | Status: DC

## 2017-04-29 MED ORDER — EMPAGLIFLOZIN 10 MG PO TABS: 10.0000 mg | ORAL_TABLET | Freq: Every day | ORAL | 3 refills | Status: DC

## 2017-04-29 NOTE — Progress Notes (Signed)
Patient ID: Bethany Miller, female   DOB: 1946/03/26, 71 y.o.   MRN: [id number]  HPI: Bethany Miller is a 71 y.o.-year-old female, returning for f/u for DM2, dx 2010, non-insulin-dependent, uncontrolled, with complications (CKD). Last visit 5  mo ago. She has [employer] part D for meds, [place], and [employer] for the rest.   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  She lost 10 lbs since last visit. Decreased appetite.   Last hemoglobin A1c was: Lab Results  Component Value Date   HGBA1C 7.6 (H) 04/21/2017   HGBA1C 7.4 (H) 03/31/2017   HGBA1C 8.5 (H) 02/17/2017    Pt is on a regimen of: - Metformin ER 1000 mg 2x a day >> 2000 mg with dinner (no difference) >> 1000 mg 2x a day - no SEs - Glipizide 5 mg 2x a day Tradjenta and Alogliptin were not covered She was previously on Invokana, Jardiance 10 mg daily >> too expensive. Tried Cycloset >> too expensive Tried Januvia >>  too expensive.  Tried Metformin 500 mg po >> severe diarrhea. She was on Glimepiride >> hypoglycemia in the 60's on 2 mg daily. She had rapid acting insulin with steroid shots before.   Pt checks her sugars 2x a day: - am: 150-160 >> 147-181, 226 >> 132-179 - 2h after b'fast: 97, 164, 173 - Lunch: 120-138 >> low 100s >> n/c >> 163 >> n/c - 2h after lunch: 160 - Dinnertime: 67, 69, 77, 91, 101 >> n/c  >> 83, 103 >> 57, 143-167 - 2h after dinner: 120-130 >> 197, 201 >>  243-302 last month, improving Lowest sugar was 87 >> 56 x1;  she has hypoglycemia awareness at 90.  Highest sugar was 302.  ReliOn meter.  - + mild CKD, last BUN/creatinine:  Lab Results  Component Value Date   BUN 27 (H) 04/21/2017   CREATININE 1.21 (H) 04/21/2017   Lab Results  Component Value Date   MICRALBCREAT 4 03/28/2016   MICRALBCREAT 0.6 11/16/2015   MICRALBCREAT 0.6 01/26/2013   MICRALBCREAT 0.3 03/24/2012   MICRALBCREAT 0.4 03/18/2011   MICRALBCREAT 11.4 10/29/2009   MICRALBCREAT 3.4 04/09/2009   MICRALBCREAT 17.1 01/25/2009   Lab  Results  Component Value Date   GFRNONAA 45 (L) 04/21/2017   GFRNONAA 54 (L) 02/27/2017   GFRNONAA 35 (L) 02/17/2017   GFRNONAA 52.79 (L) 06/19/2010   GFRNONAA 59.68 10/29/2009   GFRNONAA 53.52 07/03/2009   GFRNONAA 59.78 04/09/2009   GFRNONAA 59.82 01/25/2009   GFRNONAA 78 05/08/2008   GFRNONAA 68 01/17/2008   - last set of lipids: Lab Results  Component Value Date   CHOL 155 11/11/2016   HDL 41.50 11/11/2016   LDLCALC 80 11/11/2016   LDLDIRECT 149.6 01/26/2013   TRIG 170.0 (H) 11/11/2016   CHOLHDL 4 11/11/2016  On Lipitor. - last eye exam was in 2017 >> No DR - denies numbness and tingling in her feet. She will see a podiatrist (ingrown toenail).  She also has a history of HL, HTN.   ROS: Constitutional: + weight loss, no fatigue, no subjective hyperthermia, no subjective hypothermia Eyes: no blurry vision, no xerophthalmia ENT: no sore throat, no nodules palpated in throat, no dysphagia, no odynophagia, no hoarseness Cardiovascular: no CP/no SOB/no palpitations/no leg swelling Respiratory: no cough/no SOB/no wheezing Gastrointestinal: no N/no V/no D/no C/no acid reflux Musculoskeletal: no muscle aches/+ joint aches Skin: no rashes, no hair loss Neurological: no tremors/no numbness/no tingling/no dizziness  I reviewed pt's medications, allergies, PMH, social hx, family  hx, and changes were documented in the history of present illness. Otherwise, unchanged from my initial visit note.  PE: BP 132/74 (BP Location: Left Arm, Patient Position: Sitting)   Pulse 81   Wt 199 lb (90.3 kg)   SpO2 94%   BMI 36.40 kg/m   Wt Readings from Last 3 Encounters:  04/29/17 199 lb (90.3 kg)  04/21/17 200 lb (90.7 kg)  04/21/17 200 lb (90.7 kg)   Constitutional: obese, in NAD Eyes: PERRLA, EOMI, no exophthalmos ENT: moist mucous membranes, no thyromegaly, no cervical lymphadenopathy Cardiovascular: RRR, No RG, + 1/6 SEM Respiratory: CTA B Gastrointestinal: abdomen soft, NT, ND,  BS+ Musculoskeletal: no deformities, strength intact in all 4 Skin: moist, warm, no rashes Neurological: no tremor with outstretched hands, DTR normal in all 4   ASSESSMENT: 1. DM2, non-insulin-dependent, uncontrolled, with co: - CKD  2. HL  PLAN:  1. Patient with uncontrolled diabetes, on oral antidiabetic regimen, with still high sugars in am and at bedtime (after dinner). She also had 1 low CBG in the 50s (unclear why).  - we discussed about the need to increase her mealtime coverage >> will re-try to add an SGLT2 inh  - sent Jardiance low dose (given coupon) - advised to talk with insurance whether other meds in same class would be covered. - I suggested to:  Patient Instructions  Please continue: - Metformin ER 1000 mg 2x a day with meals - Glipizide 5 mg 2x a day before meals  Try to add: - Jardiance 10 mg before b'fast  Alternatives to Jardiance: - Invokana - Farxiga - Steglatro  Please return in 3 months with your sugar log.   - reviewed her most recent HbA1c from earlier this mo, 7.6% >> improved! - continue checking sugars at different times of the day - check 1x a day, rotating checks - advised for yearly eye exams >> she is UTD, but due soon - given flu shot today - Return to clinic in 3 mo with sugar log   2. HL - reviewed latest Lipid panel >> LDL MUCH improved on latest panel from 11/2016 - back on Lipitor - no SEs  Philemon Kingdom, MD PhD [facility] Endocrinology

## 2017-04-29 NOTE — Patient Instructions (Addendum)
Please continue: - Metformin ER 1000 mg 2x a day with meals - Glipizide 5 mg 2x a day before meals  Try to add: - Jardiance 10 mg before b'fast  Alternatives to Jardiance: - Invokana - Farxiga - Steglatro  Please return in 3 months with your sugar log.

## 2017-05-14 DIAGNOSIS — Z96651 Presence of right artificial knee joint: Secondary | ICD-10-CM | POA: Diagnosis not present

## 2017-05-14 DIAGNOSIS — M25661 Stiffness of right knee, not elsewhere classified: Secondary | ICD-10-CM | POA: Diagnosis not present

## 2017-05-15 ENCOUNTER — Ambulatory Visit (HOSPITAL_BASED_OUTPATIENT_CLINIC_OR_DEPARTMENT_OTHER)
Admission: RE | Admit: 2017-05-15 | Discharge: 2017-05-15 | Disposition: A | Discharge status: Home / Self Care | Payer: Medicare Other | Source: Ambulatory Visit | Attending: Family Medicine | Admitting: Family Medicine

## 2017-05-15 DIAGNOSIS — I10 Essential (primary) hypertension: Secondary | ICD-10-CM | POA: Insufficient documentation

## 2017-05-15 DIAGNOSIS — R011 Cardiac murmur, unspecified: Secondary | ICD-10-CM | POA: Insufficient documentation

## 2017-05-15 DIAGNOSIS — E785 Hyperlipidemia, unspecified: Secondary | ICD-10-CM | POA: Insufficient documentation

## 2017-05-15 DIAGNOSIS — E119 Type 2 diabetes mellitus without complications: Secondary | ICD-10-CM | POA: Insufficient documentation

## 2017-05-15 DIAGNOSIS — R0602 Shortness of breath: Secondary | ICD-10-CM | POA: Diagnosis not present

## 2017-05-15 NOTE — Progress Notes (Signed)
  Echocardiogram 2D Echocardiogram has been performed.  Raidyn Wassink T Nisaiah Bechtol 05/15/2017, 2:09 PM

## 2017-05-20 ENCOUNTER — Other Ambulatory Visit (INDEPENDENT_AMBULATORY_CARE_PROVIDER_SITE_OTHER): Payer: Medicare Other

## 2017-05-20 DIAGNOSIS — E785 Hyperlipidemia, unspecified: Secondary | ICD-10-CM | POA: Diagnosis not present

## 2017-05-20 LAB — LIPID PANEL
CHOLESTEROL: 157 mg/dL (ref 0–200)
HDL: 40.5 mg/dL (ref >39.00)
LDL Cholesterol: 88 mg/dL (ref 0–99)
NonHDL: 116.1
Total CHOL/HDL Ratio: 4
Triglycerides: 141 mg/dL (ref 0.0–149.0)
VLDL: 28.2 mg/dL (ref 0.0–40.0)

## 2017-05-20 LAB — COMPREHENSIVE METABOLIC PANEL
ALBUMIN: 3.9 g/dL (ref 3.5–5.2)
ALK PHOS: 80 U/L (ref 39–117)
ALT: 11 U/L (ref 0–35)
AST: 12 U/L (ref 0–37)
BUN: 31 mg/dL — ABNORMAL HIGH (ref 6–23)
CALCIUM: 9.2 mg/dL (ref 8.4–10.5)
CO2: 27 mEq/L (ref 19–32)
Chloride: 104 mEq/L (ref 96–112)
Creatinine, Ser: 1.29 mg/dL — ABNORMAL HIGH (ref 0.40–1.20)
GFR: 43.46 mL/min — AB (ref >60.00)
Glucose, Bld: 161 mg/dL — ABNORMAL HIGH (ref 70–99)
POTASSIUM: 4.7 meq/L (ref 3.5–5.1)
Sodium: 136 mEq/L (ref 135–145)
TOTAL PROTEIN: 6.9 g/dL (ref 6.0–8.3)
Total Bilirubin: 0.4 mg/dL (ref 0.2–1.2)

## 2017-05-21 DIAGNOSIS — Z471 Aftercare following joint replacement surgery: Secondary | ICD-10-CM | POA: Diagnosis not present

## 2017-05-21 DIAGNOSIS — Z96651 Presence of right artificial knee joint: Secondary | ICD-10-CM | POA: Diagnosis not present

## 2017-05-27 ENCOUNTER — Other Ambulatory Visit: Payer: Self-pay | Admitting: *Deleted

## 2017-05-27 DIAGNOSIS — R799 Abnormal finding of blood chemistry, unspecified: Secondary | ICD-10-CM

## 2017-05-27 DIAGNOSIS — R7989 Other specified abnormal findings of blood chemistry: Secondary | ICD-10-CM

## 2017-06-10 ENCOUNTER — Other Ambulatory Visit (INDEPENDENT_AMBULATORY_CARE_PROVIDER_SITE_OTHER): Payer: Medicare Other

## 2017-06-10 DIAGNOSIS — R7989 Other specified abnormal findings of blood chemistry: Secondary | ICD-10-CM | POA: Diagnosis not present

## 2017-06-10 DIAGNOSIS — E039 Hypothyroidism, unspecified: Secondary | ICD-10-CM | POA: Diagnosis not present

## 2017-06-10 DIAGNOSIS — R799 Abnormal finding of blood chemistry, unspecified: Secondary | ICD-10-CM

## 2017-06-10 LAB — T3, FREE: T3, Free: 3.1 pg/mL (ref 2.3–4.2)

## 2017-06-10 LAB — COMPREHENSIVE METABOLIC PANEL
ALT: 12 U/L (ref 0–35)
AST: 13 U/L (ref 0–37)
Albumin: 3.9 g/dL (ref 3.5–5.2)
Alkaline Phosphatase: 89 U/L (ref 39–117)
BUN: 20 mg/dL (ref 6–23)
CHLORIDE: 104 meq/L (ref 96–112)
CO2: 26 meq/L (ref 19–32)
Calcium: 9.4 mg/dL (ref 8.4–10.5)
Creatinine, Ser: 1.11 mg/dL (ref 0.40–1.20)
GFR: 51.68 mL/min — AB (ref >60.00)
GLUCOSE: 173 mg/dL — AB (ref 70–99)
POTASSIUM: 5.2 meq/L — AB (ref 3.5–5.1)
SODIUM: 136 meq/L (ref 135–145)
Total Bilirubin: 0.5 mg/dL (ref 0.2–1.2)
Total Protein: 6.4 g/dL (ref 6.0–8.3)

## 2017-06-10 LAB — TSH: TSH: 3.82 u[IU]/mL (ref 0.35–4.50)

## 2017-06-10 LAB — T4, FREE: Free T4: 0.84 ng/dL (ref 0.60–1.60)

## 2017-06-29 ENCOUNTER — Other Ambulatory Visit: Payer: Medicare Other

## 2017-07-30 ENCOUNTER — Ambulatory Visit (INDEPENDENT_AMBULATORY_CARE_PROVIDER_SITE_OTHER): Payer: Medicare Other | Admitting: Internal Medicine

## 2017-07-30 ENCOUNTER — Encounter: Payer: Self-pay | Admitting: Internal Medicine

## 2017-07-30 VITALS — BP 110/60 | HR 74 | Ht 62.0 in | Wt 199.2 lb

## 2017-07-30 DIAGNOSIS — E1151 Type 2 diabetes mellitus with diabetic peripheral angiopathy without gangrene: Secondary | ICD-10-CM | POA: Diagnosis not present

## 2017-07-30 DIAGNOSIS — E1165 Type 2 diabetes mellitus with hyperglycemia: Secondary | ICD-10-CM | POA: Diagnosis not present

## 2017-07-30 DIAGNOSIS — E785 Hyperlipidemia, unspecified: Secondary | ICD-10-CM

## 2017-07-30 DIAGNOSIS — IMO0002 Reserved for concepts with insufficient information to code with codable children: Secondary | ICD-10-CM

## 2017-07-30 LAB — POCT GLYCOSYLATED HEMOGLOBIN (HGB A1C): Hemoglobin A1C: 7

## 2017-07-30 MED ORDER — GLIPIZIDE 5 MG PO TABS: 5.0000 mg | ORAL_TABLET | Freq: Every day | ORAL | 3 refills | Status: DC

## 2017-07-30 NOTE — Addendum Note (Signed)
Addended by: Drucilla Schmidt on: 07/30/2017 11:23 AM   Modules accepted: Orders

## 2017-07-30 NOTE — Progress Notes (Signed)
Patient ID: Bethany Miller, female   DOB: [birth year], 72 y.o.   MRN: [id number]  HPI: Bethany Miller is a 72 y.o.-year-old female, returning for f/u for DM2, dx 2010, non-insulin-dependent, uncontrolled, with complications (CKD). Last visit 3 months ago. She has [employer] part D for meds, [employer], and [employer] for the rest.   She stopped Glipizide around Christmas b/c lows in the 56s.   Last hemoglobin A1c was: Lab Results  Component Value Date   HGBA1C 7.6 (H) 04/21/2017   HGBA1C 7.4 (H) 03/31/2017   HGBA1C 8.5 (H) 02/17/2017    Pt is on a regimen of: - Metformin ER 1000 mg 2x a day >> 2000 mg with dinner (no difference) >> 1000 mg 2x a day -no side effects She could not restartJardiance  - tried to add this back in 04/2017. Stopped  Glipizide 5 mg 2x a day 06/2017 b/c lows. Tradjenta and Alogliptin were not covered She was previously on Invokana, Jardiance 10 mg daily >> too expensive. Tried Cycloset >> too expensive Tried Januvia >>  too expensive.  Tried Metformin 500 mg po >> severe diarrhea. She was on Glimepiride >> hypoglycemia in the 60's on 2 mg daily. She had rapid acting insulin with steroid shots before.   Pt checks her sugars 2x a day: - am: 147-181, 226 >> 132-179 >> 95-146 - 2h after b'fast: 97, 164, 173 >> n/c - Lunch:  low 100s >> n/c >> 163 >> n/c - 2h after lunch: 160 >> n/c - Dinnertime:  83, 103 >> 57, 143-167 >> n/c - 2h after dinner: 120-130 >> 197, 201 >>  243-302 >> 150s, rarely 200s Lowest sugar was 87 >> 56 x1 >> 60s;  she has hypoglycemia awareness at 90.  Highest sugar was 302 >> 200s.  ReliOn meter.  -+ Mild CKD, last BUN/creatinine:  Lab Results  Component Value Date   BUN 20 06/10/2017   CREATININE 1.11 06/10/2017   Lab Results  Component Value Date   MICRALBCREAT 4 03/28/2016   MICRALBCREAT 0.6 11/16/2015   MICRALBCREAT 0.6 01/26/2013   MICRALBCREAT 0.3 03/24/2012   MICRALBCREAT 0.4 03/18/2011   MICRALBCREAT 11.4 10/29/2009    MICRALBCREAT 3.4 04/09/2009   MICRALBCREAT 17.1 01/25/2009   Lab Results  Component Value Date   GFRNONAA 45 (L) 04/21/2017   GFRNONAA 54 (L) 02/27/2017   GFRNONAA 35 (L) 02/17/2017   GFRNONAA 52.79 (L) 06/19/2010   GFRNONAA 59.68 10/29/2009   GFRNONAA 53.52 07/03/2009   GFRNONAA 59.78 04/09/2009   GFRNONAA 59.82 01/25/2009   GFRNONAA 78 05/08/2008   GFRNONAA 68 01/17/2008   -+ HL; last set of lipids: Lab Results  Component Value Date   CHOL 157 05/20/2017   HDL 40.50 05/20/2017   LDLCALC 88 05/20/2017   LDLDIRECT 149.6 01/26/2013   TRIG 141.0 05/20/2017   CHOLHDL 4 05/20/2017  On Lipitor - last eye exam was in 2017: No DR -No numbness and tingling in her feet. She will see a podiatrist (ingrown toenail).  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  ROS: Constitutional: no weight gain/no weight loss, no fatigue, no subjective hyperthermia, no subjective hypothermia Eyes: no blurry vision, no xerophthalmia ENT: no sore throat, no nodules palpated in throat, no dysphagia, no odynophagia, no hoarseness Cardiovascular: no CP/no SOB/no palpitations/no leg swelling Respiratory: no cough/no SOB/no wheezing Gastrointestinal: no N/no V/no D/no C/no acid reflux Musculoskeletal: no muscle aches/no joint aches Skin: no rashes, no hair loss Neurological: no tremors/no numbness/no  tingling/no dizziness  I reviewed pt's medications, allergies, PMH, social hx, family hx, and changes were documented in the history of present illness. Otherwise, unchanged from my initial visit note.  PE: BP 110/60   Pulse 74   Ht 5\' 2"  (1.575 m)   Wt 199 lb 3.2 oz (90.4 kg)   SpO2 96%   BMI 36.43 kg/m  Wt Readings from Last 3 Encounters:  07/30/17 199 lb 3.2 oz (90.4 kg)  04/29/17 199 lb (90.3 kg)  04/21/17 200 lb (90.7 kg)   Constitutional: overweight, in NAD Eyes: PERRLA, EOMI, no exophthalmos ENT: moist mucous membranes, no thyromegaly, no cervical  lymphadenopathy Cardiovascular: RRR, No MRG Respiratory: CTA B Gastrointestinal: abdomen soft, NT, ND, BS+ Musculoskeletal: no deformities, strength intact in all 4 Skin: moist, warm, no rashes Neurological: no tremor with outstretched hands, DTR normal in all 4   ASSESSMENT: 1. DM2, non-insulin-dependent, uncontrolled, with co: - CKD  2. HL  PLAN:  1. Patient with uncontrolled diabetes, on oral antidiabetic regimen, to which we added Jardiance at last visit as sugars were still high at bedtime and subsequently in a.m. However, this was expensive >> did not start, but changed her diet >> developed low CBGs (60s) >> stopped Glipizide, so now she is only on Metformin - her sugars are impressively improved after changing her diet: more veggies, smaller portions - at this visit, she still has some higher sugars in the am after a larger dinner >> discussed about adding a low dose Glipizide back before a larger diner - I suggested to:  Patient Instructions  Please continue: - Metformin ER 1000 mg 2x a day with meals  Please use: - Glipizide 5 mg before a large dinner or if you have dessert  Please return in 4 months with your sugar log.    - today, HbA1c is 7% (better) - continue checking sugars at different times of the day - check 1x a day, rotating checks - advised for yearly eye exams >> she is no UTD  - Return to clinic in 4 mo with sugar log    2. HL - Reviewed latest lipid panel from 05/2017: LDL much improved after restarting Lipitor - Continues on Lipitor without side effects  Philemon Kingdom, MD PhD [facility] Endocr[facility]

## 2017-07-30 NOTE — Patient Instructions (Addendum)
Please continue: - Metformin ER 1000 mg 2x a day with meals  Please use: - Glipizide 5 mg before a large dinner or if you have dessert  Please return in 4 months with your sugar log.

## 2017-08-20 ENCOUNTER — Encounter: Payer: Self-pay | Admitting: Internal Medicine

## 2017-08-20 MED ORDER — ONETOUCH ULTRASOFT LANCETS MISC: 12 refills | Status: DC

## 2017-08-20 MED ORDER — GLUCOSE BLOOD VI STRP: ORAL_STRIP | 12 refills | Status: DC

## 2017-08-20 MED ORDER — ONETOUCH VERIO W/DEVICE KIT: 1.0000 | PACK | Freq: Once | 0 refills | Status: AC

## 2017-08-21 ENCOUNTER — Other Ambulatory Visit: Payer: Self-pay

## 2017-08-21 MED ORDER — ACCU-CHEK MULTICLIX LANCETS MISC: 1 refills | Status: DC

## 2017-08-21 MED ORDER — GLUCOSE BLOOD VI STRP: ORAL_STRIP | 1 refills | Status: DC

## 2017-08-21 MED ORDER — ACCU-CHEK GUIDE W/DEVICE KIT: 1.0000 | PACK | Freq: Once | 0 refills | Status: DC

## 2017-08-24 ENCOUNTER — Other Ambulatory Visit: Payer: Self-pay

## 2017-08-24 MED ORDER — GLUCOSE BLOOD VI STRP: ORAL_STRIP | 1 refills | Status: DC

## 2017-08-24 MED ORDER — ACCU-CHEK MULTICLIX LANCETS MISC: 1 refills | Status: AC

## 2017-08-24 MED ORDER — ACCU-CHEK GUIDE W/DEVICE KIT: 1.0000 | PACK | Freq: Once | 0 refills | Status: AC

## 2017-08-24 MED ORDER — ACCU-CHEK GUIDE W/DEVICE KIT: 1.0000 | PACK | Freq: Once | 0 refills | Status: DC

## 2017-08-29 ENCOUNTER — Encounter: Payer: Self-pay | Admitting: Family Medicine

## 2017-08-29 DIAGNOSIS — F329 Major depressive disorder, single episode, unspecified: Secondary | ICD-10-CM

## 2017-08-29 DIAGNOSIS — E785 Hyperlipidemia, unspecified: Secondary | ICD-10-CM

## 2017-08-29 DIAGNOSIS — F32A Depression, unspecified: Secondary | ICD-10-CM

## 2017-08-29 DIAGNOSIS — I10 Essential (primary) hypertension: Secondary | ICD-10-CM

## 2017-08-31 MED ORDER — LISINOPRIL-HYDROCHLOROTHIAZIDE 10-12.5 MG PO TABS: ORAL_TABLET | ORAL | 0 refills | Status: DC

## 2017-08-31 MED ORDER — CITALOPRAM HYDROBROMIDE 40 MG PO TABS: ORAL_TABLET | ORAL | 0 refills | Status: DC

## 2017-08-31 MED ORDER — ATORVASTATIN CALCIUM 40 MG PO TABS: 40.0000 mg | ORAL_TABLET | Freq: Every day | ORAL | 0 refills | Status: DC

## 2017-10-12 NOTE — Progress Notes (Signed)
Subjective:   Bethany Miller is a 72 y.o. female who presents for an Initial Medicare Annual Wellness Visit. The Patient was informed that the wellness visit is to identify future health risk and educate and initiate measures that can reduce risk for increased disease through the lifespan.   Describes health as fair, good or great? Good. Would like to have complete control over blood sugar.  Review of Systems    No ROS.  Medicare Wellness Visit. Additional risk factors are reflected in the social history.  Cardiac Risk Factors include: advanced age (>22men, >67 women);diabetes mellitus;dyslipidemia;hypertension;obesity (BMI >30kg/m2) Sleep patterns: takes Melatonin 3 mg. Sleeps well 8-9 hrs. Feels rested.  Home Safety/Smoke Alarms: Feels safe in home. Smoke alarms in place.  Living environment; residence and Firearm Safety: 1 story home. Son lives with her.  Seat Belt Safety/Bike Helmet: Wears seat belt.   Female:     Mammo-orderd       Dexa scan-  declines   CCS- due 06/2018     Objective:    Today's Vitals   10/13/17 1150  BP: 135/66  Pulse: 60  SpO2: 96%  Weight: 202 lb (91.6 kg)  Height: 5\' 2"  (1.575 m)   Body mass index is 36.95 kg/m.  Advanced Directives 10/13/2017 04/21/2017 02/26/2017 02/26/2017 02/17/2017  Does Patient Have a Medical Advance Directive? No No No No No  Would patient like information on creating a medical advance directive? Yes (MAU/Ambulatory/Procedural Areas - Information given) - No - Patient declined No - Patient declined Yes (MAU/Ambulatory/Procedural Areas - Information given)    Current Medications (verified) Outpatient Encounter Medications as of 10/13/2017  Medication Sig  . acetaminophen (TYLENOL) 650 MG CR tablet Take 650 mg by mouth every 8 (eight) hours as needed for pain.  Marland Kitchen aspirin EC 81 MG tablet Take 81 mg by mouth daily.  Marland Kitchen atorvastatin (LIPITOR) 40 MG tablet Take 1 tablet (40 mg total) by mouth daily.  . citalopram (CELEXA) 40 MG  tablet TAKE 1 TABLET (40 MG TOTAL) BY MOUTH DAILY.  Marland Kitchen docusate sodium (COLACE) 100 MG capsule Take 1 capsule (100 mg total) by mouth 2 (two) times daily as needed for mild constipation.  Marland Kitchen glipiZIDE (GLUCOTROL) 5 MG tablet Take 1 tablet (5 mg total) by mouth daily.  Marland Kitchen glucose blood (ACCU-CHEK GUIDE) test strip Use as instructed to test once a day DX E11.51  . ibuprofen (ADVIL,MOTRIN) 800 MG tablet Take 800 mg by mouth 3 (three) times daily as needed for moderate pain.  . Lancets (ACCU-CHEK MULTICLIX) lancets Use as instructed to test once a day DX E11.51  . lisinopril-hydrochlorothiazide (PRINZIDE,ZESTORETIC) 10-12.5 MG tablet TAKE 1 TABLET BY MOUTH EVERY DAY  . Loratadine-Pseudoephedrine (CLARITIN-D 12 HOUR PO) Take 1 tablet by mouth daily as needed (sinus).   . Melatonin 3 MG TABS Take 3 mg by mouth at bedtime as needed (sleep).  . metFORMIN (GLUCOPHAGE-XR) 500 MG 24 hr tablet Take 2 tablets (1,000 mg total) by mouth 2 (two) times daily after a meal.  . Multiple Vitamins-Minerals (MULTIVITAMIN ADULT PO) Take 1 tablet by mouth daily.   Marland Kitchen omeprazole (PRILOSEC) 20 MG capsule Take 1 capsule (20 mg total) by mouth daily.   No facility-administered encounter medications on file as of 10/13/2017.     Allergies (verified) Patient has no known allergies.   History: Past Medical History:  Diagnosis Date  . Diabetes mellitus   . Hyperlipidemia   . Hypertension   . Murmur    Past Surgical  History:  Procedure Laterality Date  . ABDOMINAL HYSTERECTOMY    . BACK SURGERY    . CHOLECYSTECTOMY    . TONSILLECTOMY    . TOTAL KNEE ARTHROPLASTY    . TOTAL KNEE ARTHROPLASTY Right 02/26/2017   Procedure: RIGHT TOTAL KNEE ARTHROPLASTY;  Surgeon: Susa Day, MD;  Location: [facility];  Service: Orthopedics;  Laterality: Right;   Family History  Problem Relation Age of Onset  . Throat cancer Brother   . Heart disease Brother        MI  . Cancer Brother 80       throat,   . Hyperlipidemia Brother   .  Cirrhosis Father   . Alcohol abuse Father   . Heart disease Brother   . Cancer Brother   . Arthritis Mother   . Hypertension Sister   . Arthritis Sister   . Cancer Brother   . Cancer Brother 49       melanoma  . Hypertension Brother   . Hyperlipidemia Brother   . Melanoma Unknown   . Arthritis Unknown    Social History   Socioeconomic History  . Marital status: Widowed    Spouse name: Not on file  . Number of children: Not on file  . Years of education: Not on file  . Highest education level: Not on file  Occupational History  . Not on file  Social Needs  . Financial resource strain: Not on file  . Food insecurity:    Worry: Not on file    Inability: Not on file  . Transportation needs:    Medical: Not on file    Non-medical: Not on file  Tobacco Use  . Smoking status: Never Smoker  . Smokeless tobacco: Never Used  Substance and Sexual Activity  . Alcohol use: Yes    Comment: rare  . Drug use: No  . Sexual activity: Not Currently    Partners: Male  Lifestyle  . Physical activity:    Days per week: Not on file    Minutes per session: Not on file  . Stress: Not on file  Relationships  . Social connections:    Talks on phone: Not on file    Gets together: Not on file    Attends religious service: Not on file    Active member of club or organization: Not on file    Attends meetings of clubs or organizations: Not on file    Relationship status: Not on file  Other Topics Concern  . 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 N N  Vision? N N  Comment wears reading glasses. Pt states she is pass due and will schedule eye exam.  -   Difficulty concentrating or making decisions? N N  Walking or climbing stairs? N Y  Comment better going up than down -  Dressing or bathing? N N  Doing errands, shopping? N N  Preparing Food and eating ? N -  Using the Toilet? N -  In the past six months, have you accidently leaked urine? N -  Comment wears pad -  Do you have problems with loss of  bowel control? N -  Managing your Medications? N -  Managing your Finances? N -  Housekeeping or managing your Housekeeping? N -  Some recent data might be hidden     Immunizations and Health Maintenance Immunization History  Administered Date(s) Administered  . Influenza Split 05/20/2011, 03/24/2012  . Influenza Whole 04/16/2009, 04/30/2010  . Influenza, High Dose Seasonal PF 03/28/2016, 04/29/2017  . Influenza,inj,Quad PF,6+ Mos 04/08/2013  . Pneumococcal Conjugate-13 03/28/2016  . Td 11/26/2009  . Zoster 01/02/2010   Health Maintenance Due  Topic Date Due  . DEXA SCAN  08/21/2012  . MAMMOGRAM  10/27/2012  . OPHTHALMOLOGY EXAM  11/26/2013  . PNA vac Low Risk Adult (2 of 2 - PPSV23) 03/28/2017  . FOOT EXAM  08/26/2017    Patient Care Team: Carollee Herter, Alferd Apa, DO as PCP - General  Indicate any recent Medical Services you may have received from other than [facility] providers in the past year (date may be approximate).     Assessment:   This is a routine wellness examination for [place].Physical assessment deferred to PCP.  Hearing/Vision screen  Visual Acuity Screening   Right eye Left eye Both eyes  Without correction: 20/25 20/25 20/25   With correction:     Hearing Screening Comments: Able to hear conversational tones w/o difficulty. No issues reported.  Passed whisper test.   Dietary issues and exercise activities discussed: Current Exercise Habits: The patient does not participate in regular exercise at present, Exercise limited by: None identified   Diet (meal preparation, eat out, water intake, caffeinated  beverages, dairy products, fruits and vegetables): in general, an "unhealthy" diet     Goals    . Weight (lb) < 170 lb (77.1 kg)     Exercise at least 2x/ week. Eat healthier.       Depression Screen PHQ 2/9 Scores 10/13/2017 04/21/2017 11/16/2015  PHQ - 2 Score 0 1 0    Fall Risk Fall Risk  10/13/2017 04/21/2017 11/16/2015  Falls in the past year? No No No    Cognitive Function: MMSE - Mini Mental State Exam 10/13/2017  Orientation to time 5  Orientation to Place 5  Registration 3  Attention/ Calculation 5  Recall 3  Language- name 2 objects 2  Language- repeat 1  Language- follow 3 step command 3  Language- read & follow direction 1  Write a sentence 1  Copy design 1  Total score 30        Screening Tests Health Maintenance  Topic Date Due  . DEXA SCAN  08/21/2012  . MAMMOGRAM  10/27/2012  . OPHTHALMOLOGY EXAM  11/26/2013  . PNA vac Low Risk Adult (2 of 2 - PPSV23) 03/28/2017  . FOOT EXAM  08/26/2017  . HEMOGLOBIN A1C  01/27/2018  . INFLUENZA VACCINE  02/11/2018  . COLONOSCOPY  06/26/2018  . TETANUS/TDAP  11/27/2019  . Hepatitis C Screening  Completed        Plan:   Please schedule appointment to follow up with Dr.Lowne.  Continue to eat heart healthy diet (full of fruits, vegetables, whole grains, lean protein, water--limit salt, fat, and sugar intake) and increase physical activity as tolerated.  Continue doing brain stimulating activities (puzzles, reading, adult coloring books, staying active) to keep memory sharp.   Bring a copy of your living will and/or healthcare power of attorney to your next office visit.  Please schedule your diabetic eye exam.    I have personally reviewed and noted the following in the patient's  chart:   . Medical and social history . Use of alcohol, tobacco or illicit drugs  . Current medications and supplements . Functional ability and status . Nutritional status . Physical activity . Advanced directives . List of  other physicians . Hospitalizations, surgeries, and ER visits in previous 12 months . Vitals . Screenings to include cognitive, depression, and falls . Referrals and appointments  In addition, I have reviewed and discussed with patient certain preventive protocols, quality metrics, and best practice recommendations. A written personalized care plan for preventive services as well as general preventive health recommendations were provided to patient.     Shela Nevin, [place]   10/13/2017

## 2017-10-13 ENCOUNTER — Other Ambulatory Visit: Payer: Self-pay | Admitting: Family Medicine

## 2017-10-13 ENCOUNTER — Ambulatory Visit (HOSPITAL_BASED_OUTPATIENT_CLINIC_OR_DEPARTMENT_OTHER)
Admission: RE | Admit: 2017-10-13 | Discharge: 2017-10-13 | Disposition: A | Discharge status: Home / Self Care | Payer: Medicare Other | Source: Ambulatory Visit | Attending: Family Medicine | Admitting: Family Medicine

## 2017-10-13 ENCOUNTER — Encounter (HOSPITAL_BASED_OUTPATIENT_CLINIC_OR_DEPARTMENT_OTHER): Payer: Self-pay

## 2017-10-13 ENCOUNTER — Encounter: Payer: Self-pay | Admitting: *Deleted

## 2017-10-13 ENCOUNTER — Ambulatory Visit (INDEPENDENT_AMBULATORY_CARE_PROVIDER_SITE_OTHER): Payer: Medicare Other | Admitting: *Deleted

## 2017-10-13 VITALS — BP 135/66 | HR 60 | Ht 62.0 in | Wt 202.0 lb

## 2017-10-13 DIAGNOSIS — Z1231 Encounter for screening mammogram for malignant neoplasm of breast: Secondary | ICD-10-CM | POA: Insufficient documentation

## 2017-10-13 DIAGNOSIS — Z1239 Encounter for other screening for malignant neoplasm of breast: Secondary | ICD-10-CM

## 2017-10-13 DIAGNOSIS — Z0000 Encounter for general adult medical examination without abnormal findings: Secondary | ICD-10-CM | POA: Diagnosis not present

## 2017-10-13 NOTE — Progress Notes (Signed)
Reviewed  Yvonne R Lowne Chase, DO  

## 2017-10-13 NOTE — Patient Instructions (Signed)
Please schedule appointment to follow up with Dr.Lowne.  Continue to eat heart healthy diet (full of fruits, vegetables, whole grains, lean protein, water--limit salt, fat, and sugar intake) and increase physical activity as tolerated.  Continue doing brain stimulating activities (puzzles, reading, adult coloring books, staying active) to keep memory sharp.   Bring a copy of your living will and/or healthcare power of attorney to your next office visit.  Please schedule your diabetic eye exam.   Bethany Miller , Thank you for taking time to come for your Medicare Wellness Visit. I appreciate your ongoing commitment to your health goals. Please review the following plan we discussed and let me know if I can assist you in the future.   These are the goals we discussed: Goals    . Weight (lb) < 170 lb (77.1 kg)     Exercise at least 2x/ week. Eat healthier.        This is a list of the screening recommended for you and due dates:  Health Maintenance  Topic Date Due  . DEXA scan (bone density measurement)  08/21/2012  . Mammogram  10/27/2012  . Eye exam for diabetics  11/26/2013  . Pneumonia vaccines (2 of 2 - PPSV23) 03/28/2017  . Complete foot exam   08/26/2017  . Hemoglobin A1C  01/27/2018  . Flu Shot  02/11/2018  . Colon Cancer Screening  06/26/2018  . Tetanus Vaccine  11/27/2019  .  Hepatitis C: One time screening is recommended by Center for Disease Control  (CDC) for  adults born from [birth year] through 1963.   Completed    Health Maintenance for Postmenopausal Women Menopause is a normal process in which your reproductive ability comes to an end. This process happens gradually over a span of months to years, usually between the ages of 28 and 53. Menopause is complete when you have missed 12 consecutive menstrual periods. It is important to talk with your health care provider about some of the most common conditions that affect postmenopausal women, such as heart disease, cancer,  and bone loss (osteoporosis). Adopting a healthy lifestyle and getting preventive care can help to promote your health and wellness. Those actions can also lower your chances of developing some of these common conditions. What should I know about menopause? During menopause, you may experience a number of symptoms, such as:  Moderate-to-severe hot flashes.  Night sweats.  Decrease in sex drive.  Mood swings.  Headaches.  Tiredness.  Irritability.  Memory problems.  Insomnia.  Choosing to treat or not to treat menopausal changes is an individual decision that you make with your health care provider. What should I know about hormone replacement therapy and supplements? Hormone therapy products are effective for treating symptoms that are associated with menopause, such as hot flashes and night sweats. Hormone replacement carries certain risks, especially as you become older. If you are thinking about using estrogen or estrogen with progestin treatments, discuss the benefits and risks with your health care provider. What should I know about heart disease and stroke? Heart disease, heart attack, and stroke become more likely as you age. This may be due, in part, to the hormonal changes that your body experiences during menopause. These can affect how your body processes dietary fats, triglycerides, and cholesterol. Heart attack and stroke are both medical emergencies. There are many things that you can do to help prevent heart disease and stroke:  Have your blood pressure checked at least every 1-2 years. High blood pressure  causes heart disease and increases the risk of stroke.  If you are 71-14 years old, ask your health care provider if you should take aspirin to prevent a heart attack or a stroke.  Do not use any tobacco products, including cigarettes, chewing tobacco, or electronic cigarettes. If you need help quitting, ask your health care provider.  It is important to eat a  healthy diet and maintain a healthy weight. ? Be sure to include plenty of vegetables, fruits, low-fat dairy products, and lean protein. ? Avoid eating foods that are high in solid fats, added sugars, or salt (sodium).  Get regular exercise. This is one of the most important things that you can do for your health. ? Try to exercise for at least 150 minutes each week. The type of exercise that you do should increase your heart rate and make you sweat. This is known as moderate-intensity exercise. ? Try to do strengthening exercises at least twice each week. Do these in addition to the moderate-intensity exercise.  Know your numbers.Ask your health care provider to check your cholesterol and your blood glucose. Continue to have your blood tested as directed by your health care provider.  What should I know about cancer screening? There are several types of cancer. Take the following steps to reduce your risk and to catch any cancer development as early as possible. Breast Cancer  Practice breast self-awareness. ? This means understanding how your breasts normally appear and feel. ? It also means doing regular breast self-exams. Let your health care provider know about any changes, no matter how small.  If you are 68 or older, have a clinician do a breast exam (clinical breast exam or CBE) every year. Depending on your age, family history, and medical history, it may be recommended that you also have a yearly breast X-ray (mammogram).  If you have a family history of breast cancer, talk with your health care provider about genetic screening.  If you are at high risk for breast cancer, talk with your health care provider about having an MRI and a mammogram every year.  Breast cancer (BRCA) gene test is recommended for women who have family members with BRCA-related cancers. Results of the assessment will determine the need for genetic counseling and BRCA1 and for BRCA2 testing. BRCA-related  cancers include these types: ? Breast. This occurs in males or females. ? Ovarian. ? Tubal. This may also be called fallopian tube cancer. ? Cancer of the abdominal or pelvic lining (peritoneal cancer). ? Prostate. ? Pancreatic.  Cervical, Uterine, and Ovarian Cancer Your health care provider may recommend that you be screened regularly for cancer of the pelvic organs. These include your ovaries, uterus, and vagina. This screening involves a pelvic exam, which includes checking for microscopic changes to the surface of your cervix (Pap test).  For women ages 21-65, health care providers may recommend a pelvic exam and a Pap test every three years. For women ages 10-65, they may recommend the Pap test and pelvic exam, combined with testing for human papilloma virus (HPV), every five years. Some types of HPV increase your risk of cervical cancer. Testing for HPV may also be done on women of any age who have unclear Pap test results.  Other health care providers may not recommend any screening for nonpregnant women who are considered low risk for pelvic cancer and have no symptoms. Ask your health care provider if a screening pelvic exam is right for you.  If you have had  past treatment for cervical cancer or a condition that could lead to cancer, you need Pap tests and screening for cancer for at least 20 years after your treatment. If Pap tests have been discontinued for you, your risk factors (such as having a new sexual partner) need to be reassessed to determine if you should start having screenings again. Some women have medical problems that increase the chance of getting cervical cancer. In these cases, your health care provider may recommend that you have screening and Pap tests more often.  If you have a family history of uterine cancer or ovarian cancer, talk with your health care provider about genetic screening.  If you have vaginal bleeding after reaching menopause, tell your health  care provider.  There are currently no reliable tests available to screen for ovarian cancer.  Lung Cancer Lung cancer screening is recommended for adults 41-57 years old who are at high risk for lung cancer because of a history of smoking. A yearly low-dose CT scan of the lungs is recommended if you:  Currently smoke.  Have a history of at least 30 pack-years of smoking and you currently smoke or have quit within the past 15 years. A pack-year is smoking an average of one pack of cigarettes per day for one year.  Yearly screening should:  Continue until it has been 15 years since you quit.  Stop if you develop a health problem that would prevent you from having lung cancer treatment.  Colorectal Cancer  This type of cancer can be detected and can often be prevented.  Routine colorectal cancer screening usually begins at age 72 and continues through age 1.  If you have risk factors for colon cancer, your health care provider may recommend that you be screened at an earlier age.  If you have a family history of colorectal cancer, talk with your health care provider about genetic screening.  Your health care provider may also recommend using home test kits to check for hidden blood in your stool.  A small camera at the end of a tube can be used to examine your colon directly (sigmoidoscopy or colonoscopy). This is done to check for the earliest forms of colorectal cancer.  Direct examination of the colon should be repeated every 5-10 years until age 34. However, if early forms of precancerous polyps or small growths are found or if you have a family history or genetic risk for colorectal cancer, you may need to be screened more often.  Skin Cancer  Check your skin from head to toe regularly.  Monitor any moles. Be sure to tell your health care provider: ? About any new moles or changes in moles, especially if there is a change in a mole's shape or color. ? If you have a mole  that is larger than the size of a pencil eraser.  If any of your family members has a history of skin cancer, especially at a young age, talk with your health care provider about genetic screening.  Always use sunscreen. Apply sunscreen liberally and repeatedly throughout the day.  Whenever you are outside, protect yourself by wearing long sleeves, pants, a wide-brimmed hat, and sunglasses.  What should I know about osteoporosis? Osteoporosis is a condition in which bone destruction happens more quickly than new bone creation. After menopause, you may be at an increased risk for osteoporosis. To help prevent osteoporosis or the bone fractures that can happen because of osteoporosis, the following is recommended:  If you  are 78-14 years old, get at least 1,000 mg of calcium and at least 600 mg of vitamin D per day.  If you are older than age 55 but younger than age 2, get at least 1,200 mg of calcium and at least 600 mg of vitamin D per day.  If you are older than age 90, get at least 1,200 mg of calcium and at least 800 mg of vitamin D per day.  Smoking and excessive alcohol intake increase the risk of osteoporosis. Eat foods that are rich in calcium and vitamin D, and do weight-bearing exercises several times each week as directed by your health care provider. What should I know about how menopause affects my mental health? Depression may occur at any age, but it is more common as you become older. Common symptoms of depression include:  Low or sad mood.  Changes in sleep patterns.  Changes in appetite or eating patterns.  Feeling an overall lack of motivation or enjoyment of activities that you previously enjoyed.  Frequent crying spells.  Talk with your health care provider if you think that you are experiencing depression. What should I know about immunizations? It is important that you get and maintain your immunizations. These include:  Tetanus, diphtheria, and pertussis  (Tdap) booster vaccine.  Influenza every year before the flu season begins.  Pneumonia vaccine.  Shingles vaccine.  Your health care provider may also recommend other immunizations. This information is not intended to replace advice given to you by your health care provider. Make sure you discuss any questions you have with your health care provider. Document Released: 08/22/2005 Document Revised: 01/18/2016 Document Reviewed: 04/03/2015 Elsevier Interactive Patient Education  2018 [employer].

## 2017-11-26 ENCOUNTER — Ambulatory Visit (INDEPENDENT_AMBULATORY_CARE_PROVIDER_SITE_OTHER): Payer: Medicare Other | Admitting: Internal Medicine

## 2017-11-26 ENCOUNTER — Encounter: Payer: Self-pay | Admitting: Internal Medicine

## 2017-11-26 VITALS — BP 116/68 | HR 68 | Ht 61.75 in | Wt 201.0 lb

## 2017-11-26 DIAGNOSIS — E669 Obesity, unspecified: Secondary | ICD-10-CM | POA: Diagnosis not present

## 2017-11-26 DIAGNOSIS — E1165 Type 2 diabetes mellitus with hyperglycemia: Secondary | ICD-10-CM | POA: Diagnosis not present

## 2017-11-26 DIAGNOSIS — E785 Hyperlipidemia, unspecified: Secondary | ICD-10-CM | POA: Diagnosis not present

## 2017-11-26 DIAGNOSIS — E1151 Type 2 diabetes mellitus with diabetic peripheral angiopathy without gangrene: Secondary | ICD-10-CM | POA: Diagnosis not present

## 2017-11-26 DIAGNOSIS — IMO0002 Reserved for concepts with insufficient information to code with codable children: Secondary | ICD-10-CM

## 2017-11-26 LAB — POCT GLYCOSYLATED HEMOGLOBIN (HGB A1C): HEMOGLOBIN A1C: 8.4

## 2017-11-26 MED ORDER — METFORMIN HCL ER 500 MG PO TB24: 1000.0000 mg | ORAL_TABLET | Freq: Two times a day (BID) | ORAL | 3 refills | Status: DC

## 2017-11-26 NOTE — Progress Notes (Signed)
Patient ID: Bethany Miller, female   DOB: 01-Dec-1945, 72 y.o.   MRN: [id number]  HPI: Bethany Miller is a 72 y.o.-year-old female, returning for f/u for DM2, dx 2010, non-insulin-dependent, uncontrolled, with complications (CKD). Last visit 4 months ago. She has [employer] part D for meds, [employer], and [employer] for the rest.   She relaxed her diet since last visit: more watermelon, larger portions.   Last hemoglobin A1c was: Lab Results  Component Value Date   HGBA1C 7.0 07/30/2017   HGBA1C 7.6 (H) 04/21/2017   HGBA1C 7.4 (H) 03/31/2017    Pt is on a regimen of: - Metformin ER 1000 mg 2x a day (we tried to use 2000 mg at night but this did not make a difference) - Glipizide 5 mg before a large dinner - did not use this since last visit She could not restartJardiance  - tried to add this back in 04/2017. Stopped  Glipizide 5 mg 2x a day 06/2017 b/c lows. Tradjenta and Alogliptin were not covered She was previously on Invokana, Jardiance 10 mg daily >> too expensive. Tried Cycloset >> too expensive Tried Januvia >>  too expensive.  Tried Metformin 500 mg po >> severe diarrhea. She was on Glimepiride >> hypoglycemia in the 60's on 2 mg daily. She had rapid acting insulin with steroid shots before.   Pt checks her sugars 2X a day: - am: 147-181, 226 >> 132-179 >> 95-146 >> 130-140 - 2h after b'fast: 97, 164, 173 >> n/c - Lunch:  low 100s >> n/c >> 163 >> n/c - 2h after lunch: 160 >> n/c - Dinnertime:  83, 103 >> 57, 143-167 >> n/c - 2h after dinner:  243-302 >> 150s, rarely 200s >> up to 200 Lowest sugar was 87 >> 56 x1 >> 60s >> 67 (pm);  she has hypoglycemia awareness  in the 90s. Highest sugar was 302 >> 200s >> 281.  ReliOn meter.  -+ Mild CKD, last BUN/creatinine:  Lab Results  Component Value Date   BUN 20 06/10/2017   CREATININE 1.11 06/10/2017   Lab Results  Component Value Date   MICRALBCREAT 4 03/28/2016   MICRALBCREAT 0.6 11/16/2015   MICRALBCREAT 0.6  01/26/2013   MICRALBCREAT 0.3 03/24/2012   MICRALBCREAT 0.4 03/18/2011   MICRALBCREAT 11.4 10/29/2009   MICRALBCREAT 3.4 04/09/2009   MICRALBCREAT 17.1 01/25/2009   Last GFR lower: Lab Results  Component Value Date   GFRNONAA 45 (L) 04/21/2017   GFRNONAA 54 (L) 02/27/2017   GFRNONAA 35 (L) 02/17/2017   GFRNONAA 52.79 (L) 06/19/2010   GFRNONAA 59.68 10/29/2009   GFRNONAA 53.52 07/03/2009   GFRNONAA 59.78 04/09/2009   GFRNONAA 59.82 01/25/2009   GFRNONAA 78 05/08/2008   GFRNONAA 68 01/17/2008   -+ HL; last set of lipids: Lab Results  Component Value Date   CHOL 157 05/20/2017   HDL 40.50 05/20/2017   LDLCALC 88 05/20/2017   LDLDIRECT 149.6 01/26/2013   TRIG 141.0 05/20/2017   CHOLHDL 4 05/20/2017  On Lipitor.  R - last eye exam was in 2017: No DR. Pending new appt next week. - no numbness and tingling in her feet.  She sees podiatry for ingrown toenails.  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  ROS: Constitutional: no weight gain/no weight loss, no fatigue, no subjective hyperthermia, no subjective hypothermia Eyes: no blurry vision, no xerophthalmia ENT: no sore throat, no nodules palpated in throat, no dysphagia, no odynophagia, no hoarseness Cardiovascular: no CP/no  SOB/no palpitations/no leg swelling Respiratory: no cough/no SOB/no wheezing Gastrointestinal: no N/no V/no D/no C/no acid reflux Musculoskeletal: no muscle aches/no joint aches Skin: no rashes, no hair loss Neurological: no tremors/no numbness/no tingling/no dizziness  I reviewed pt's medications, allergies, PMH, social hx, family hx, and changes were documented in the history of present illness. Otherwise, unchanged from my initial visit note.  PE: BP 116/68 (BP Location: Left Arm, Patient Position: Sitting, Cuff Size: Normal)   Pulse 68   Ht 5' 1.75" (1.568 m)   Wt 201 lb (91.2 kg)   SpO2 95%   BMI 37.06 kg/m  Wt Readings from Last 3 Encounters:  11/26/17 201 lb (91.2 kg)   10/13/17 202 lb (91.6 kg)  07/30/17 199 lb 3.2 oz (90.4 kg)   Constitutional: overweight, in NAD Eyes: PERRLA, EOMI, no exophthalmos ENT: moist mucous membranes, no thyromegaly, no cervical lymphadenopathy Cardiovascular: RRR, No MRG Respiratory: CTA B Gastrointestinal: abdomen soft, NT, ND, BS+ Musculoskeletal: no deformities, strength intact in all 4 Skin: moist, warm, no rashes Neurological: no tremor with outstretched hands, DTR normal in all 4   ASSESSMENT: 1. DM2, non-insulin-dependent, uncontrolled, with co: - CKD  2. HL   3. obesity  PLAN:  1. Patient with History of uncontrolled diabetes, on oral antidiabetic regimen, to which we try to add Jardiance, however, this was expensive so she did not start.  However, she did change her diet and even developed low blood sugars in the 60s so we stopped glipizide before last visit, however, at last visit, she had some higher sugars in the morning after a large dinner, so I advised her to only take 5 mg of glipizide before a large dinner or if she had dessert.  Otherwise, I advised her to continue with her diet which consisted more of veggies and smaller portions.   - at this visit, sugars are higher 2/2 stress and dietary indiscretions >> discussed about dietary changes and I suggested a low fat diet >> given examples of meals, also discussed about how and when to eat fruit - will also add back Glipizide before dinner as her sugars at bedtime are high - I suggested to:  Patient Instructions  Please continue: - Metformin ER 1000 mg 2x a day  Restart: - Glipizide 2.5-5 mg 15-30 min before dinner  Please come back for a follow-up appointment in 3-4 months.  - today, HbA1c is 8.4% (higher) - continue checking sugars at different times of the day - check 1x a day, rotating checks - advised for yearly eye exams >> she is ot UTD - Return to clinic in 3-4 mo with sugar log     2. HL - Reviewed latest lipid panel from 05/2017: LDL  much improved after restarting Lipitor - Continues the statin without side effects.  3.  Obesity - suggested dietary changes  Philemon Kingdom, MD PhD [facility] Endocrinology

## 2017-11-26 NOTE — Patient Instructions (Signed)
Please continue: - Metformin ER 1000 mg 2x a day  Restart: - Glipizide 2.5-5 mg 15-30 min before dinner  Please come back for a follow-up appointment in 3-4 months.

## 2017-12-08 ENCOUNTER — Other Ambulatory Visit: Payer: Self-pay | Admitting: Family Medicine

## 2017-12-08 DIAGNOSIS — E785 Hyperlipidemia, unspecified: Secondary | ICD-10-CM

## 2017-12-08 DIAGNOSIS — I10 Essential (primary) hypertension: Secondary | ICD-10-CM

## 2017-12-22 ENCOUNTER — Encounter: Payer: Self-pay | Admitting: Family Medicine

## 2017-12-22 NOTE — Telephone Encounter (Signed)
Can add flonase , nasacort or rhinocort otc If she thinks she may need abx-- will need ov

## 2017-12-24 ENCOUNTER — Ambulatory Visit (INDEPENDENT_AMBULATORY_CARE_PROVIDER_SITE_OTHER): Payer: Medicare Other | Admitting: Family Medicine

## 2017-12-24 ENCOUNTER — Encounter: Payer: Self-pay | Admitting: Family Medicine

## 2017-12-24 VITALS — BP 110/60 | HR 63 | Temp 98.3°F | Resp 16 | Wt 204.2 lb

## 2017-12-24 DIAGNOSIS — J324 Chronic pansinusitis: Secondary | ICD-10-CM

## 2017-12-24 DIAGNOSIS — J40 Bronchitis, not specified as acute or chronic: Secondary | ICD-10-CM

## 2017-12-24 MED ORDER — AMOXICILLIN-POT CLAVULANATE 875-125 MG PO TABS: 1.0000 | ORAL_TABLET | Freq: Two times a day (BID) | ORAL | 0 refills | Status: DC

## 2017-12-24 MED ORDER — HYDROCODONE-HOMATROPINE 5-1.5 MG/5ML PO SYRP: 5.0000 mL | ORAL_SOLUTION | Freq: Three times a day (TID) | ORAL | 0 refills | Status: DC | PRN

## 2017-12-24 NOTE — Progress Notes (Signed)
Subjective:  I acted as a Education administrator for [employer]. Yancey Flemings, [place]   Patient ID: Bethany Miller, female    DOB: [birth year], 72 y.o.   MRN: [id number]  Chief Complaint  Patient presents with  . Cough    HPI   is in today for cough.  She is taking mucinex , flonase, decongestant.  No fevers    Cough is keeping her up.  +chills  Cough is productive -- yellow / green .     Patient Care Team: Carollee Herter, Alferd Apa, DO as PCP - General Philemon Kingdom, MD as Consulting Physician (Internal Medicine)   Past Medical History:  Diagnosis Date  . Diabetes mellitus   . Hyperlipidemia   . Hypertension   . Murmur     Past Surgical History:  Procedure Laterality Date  . ABDOMINAL HYSTERECTOMY    . BACK SURGERY    . CHOLECYSTECTOMY    . TONSILLECTOMY    . TOTAL KNEE ARTHROPLASTY    . TOTAL KNEE ARTHROPLASTY Right 02/26/2017   Procedure: RIGHT TOTAL KNEE ARTHROPLASTY;  Surgeon: Susa Day, MD;  Location: [facility];  Service: Orthopedics;  Laterality: Right;    Family History  Problem Relation Age of Onset  . Throat cancer Brother   . Heart disease Brother        MI  . Cancer Brother 80       throat,   . Hyperlipidemia Brother   . Cirrhosis Father   . Alcohol abuse Father   . Heart disease Brother   . Cancer Brother   . Arthritis Mother   . Hypertension Sister   . Arthritis Sister   . Cancer Brother   . Cancer Brother 33       melanoma  . Hypertension Brother   . Hyperlipidemia Brother   . Melanoma Unknown   . Arthritis Unknown     Social History   Socioeconomic History  . Marital status: Widowed    Spouse name: Not on file  . Number of children: Not on file  . Years of education: Not on file  . Highest education level: Not on file  Occupational History  . Not on file  Social Needs  . Financial resource strain: Not on file  . Food insecurity:    Worry: Not on file    Inability: Not on file  . Transportation needs:    Medical: Not on file    Non-medical:  Not on file  Tobacco Use  . Smoking status: Never Smoker  . Smokeless tobacco: Never Used  Substance and Sexual Activity  . Alcohol use: Yes    Comment: rare  . Drug use: No  . Sexual activity: Not Currently    Partners: Male  Lifestyle  . Physical activity:    Days per week: Not on file    Minutes per session: Not on file  . Stress: Not on file  Relationships  . Social connections:    Talks on phone: Not on file    Gets together: Not on file    Attends religious service: Not on file    Active member of club or organization: Not on file    Attends meetings of clubs or organizations: Not on file    Relationship status: Not on file  . Intimate partner violence:    Fear of current or ex partner: Not on file    Emotionally abused: Not on file    Physically abused: Not on file    Forced  sexual activity: Not on file  Other Topics Concern  . 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 acetaminophen (TYLENOL) 650 MG CR tablet Take 650 mg by mouth every 8 (eight) hours as needed for pain.    Marland Kitchen aspirin EC 81 MG tablet Take 81 mg by mouth daily.    Marland Kitchen atorvastatin (LIPITOR) 40 MG tablet TAKE 1 TABLET (40 MG TOTAL) BY MOUTH DAILY. 90 tablet 0  . citalopram (CELEXA) 40 MG tablet TAKE 1 TABLET (40 MG TOTAL) BY MOUTH DAILY. 90 tablet 0  . docusate sodium (COLACE) 100 MG capsule Take 1 capsule (100 mg total) by mouth 2 (two) times daily as needed for mild constipation. 30 capsule 1  . glipiZIDE (GLUCOTROL) 5 MG tablet Take 1 tablet (5 mg total) by mouth daily. 90 tablet 3  . glucose blood (ACCU-CHEK GUIDE) test strip Use as instructed to test once a day DX E11.51 100 each 1  . ibuprofen (ADVIL,MOTRIN) 800 MG tablet Take 800 mg by mouth 3 (three) times daily as needed for moderate pain.    . Lancets (ACCU-CHEK MULTICLIX) lancets Use as  instructed to test once a day DX E11.51 100 each 1  . lisinopril-hydrochlorothiazide (PRINZIDE,ZESTORETIC) 10-12.5 MG tablet TAKE 1 TABLET EVERY DAY 90 tablet 0  . Loratadine-Pseudoephedrine (CLARITIN-D 12 HOUR PO) Take 1 tablet by mouth daily as needed (sinus).     . Melatonin 3 MG TABS Take 3 mg by mouth at bedtime as needed (sleep).    . metFORMIN (GLUCOPHAGE-XR) 500 MG 24 hr tablet Take 2 tablets (1,000 mg total) by mouth 2 (two) times daily after a meal. 360 tablet 3  . Multiple Vitamins-Minerals (MULTIVITAMIN ADULT PO) Take 1 tablet by mouth daily.     Marland Kitchen omeprazole (PRILOSEC) 20 MG capsule Take 1 capsule (20 mg total) by mouth daily. 90 capsule 1   No facility-administered medications prior to visit.     No Known Allergies  Review of Systems  Constitutional: Negative for chills, fever and malaise/fatigue.  HENT: Positive for congestion and sinus pain. Negative for hearing loss and sore throat.   Eyes: Negative for discharge.  Respiratory: Positive for cough, sputum production and shortness of breath.   Cardiovascular: Negative for chest pain, palpitations and leg swelling.  Gastrointestinal: Negative for abdominal pain, blood in stool, constipation, diarrhea, heartburn, nausea and vomiting.  Genitourinary: Negative for dysuria, frequency, hematuria and urgency.  Musculoskeletal: Negative for back pain, falls and myalgias.  Skin: Negative for rash.  Neurological: Negative for dizziness, sensory change, loss of consciousness, weakness and headaches.  Endo/Heme/Allergies: Negative for environmental allergies. Does not bruise/bleed easily.  Psychiatric/Behavioral: Negative for depression and suicidal ideas. The patient is not nervous/anxious and does not have insomnia.        Objective:    Physical Exam  Constitutional: She is oriented to person, place, and time. She appears well-nourished.  HENT:  Nose: Mucosal edema, rhinorrhea and sinus tenderness present. No nasal deformity.  Right sinus exhibits maxillary sinus tenderness and frontal sinus tenderness. Left sinus exhibits maxillary sinus tenderness and frontal sinus tenderness.  Mouth/Throat: Oropharynx is clear and moist and mucous membranes are normal. No oropharyngeal exudate.  Neck: Normal range of motion. Neck supple.  Cardiovascular: Normal rate, regular rhythm and normal heart sounds.  No murmur heard. Pulmonary/Chest: Effort normal. No  respiratory distress. She has decreased breath sounds. She has no rales.  Lymphadenopathy:    She has no cervical adenopathy.  Neurological: She is alert and oriented to person, place, and time.  Skin: Skin is warm. She is not diaphoretic.  Psychiatric: She has a normal mood and affect.  Nursing note and vitals reviewed.   BP 110/60 (BP Location: Left Arm, Patient Position: Sitting, Cuff Size: Large)   Pulse 63   Temp 98.3 F (36.8 C) (Oral)   Resp 16   Wt 204 lb 3.7 oz (92.6 kg)   SpO2 95%   BMI 37.66 kg/m  Wt Readings from Last 3 Encounters:  12/24/17 204 lb 3.7 oz (92.6 kg)  11/26/17 201 lb (91.2 kg)  10/13/17 202 lb (91.6 kg)   BP Readings from Last 3 Encounters:  12/24/17 110/60  11/26/17 116/68  10/13/17 135/66     Immunization History  Administered Date(s) Administered  . Influenza Split 05/20/2011, 03/24/2012  . Influenza Whole 04/16/2009, 04/30/2010  . Influenza, High Dose Seasonal PF 03/28/2016, 04/29/2017  . Influenza,inj,Quad PF,6+ Mos 04/08/2013  . Pneumococcal Conjugate-13 03/28/2016  . Td 11/26/2009  . Zoster 01/02/2010    Health Maintenance  Topic Date Due  . DEXA SCAN  08/21/2012  . OPHTHALMOLOGY EXAM  11/26/2013  . PNA vac Low Risk Adult (2 of 2 - PPSV23) 03/28/2017  . FOOT EXAM  08/26/2017  . INFLUENZA VACCINE  02/11/2018  . HEMOGLOBIN A1C  05/29/2018  . COLONOSCOPY  06/26/2018  . MAMMOGRAM  10/14/2019  . TETANUS/TDAP  11/27/2019  . Hepatitis C Screening  Completed    Lab Results  Component Value Date   WBC 9.6  04/21/2017   HGB 11.9 (L) 04/21/2017   HCT 36.1 04/21/2017   PLT 288 04/21/2017   GLUCOSE 173 (H) 06/10/2017   CHOL 157 05/20/2017   TRIG 141.0 05/20/2017   HDL 40.50 05/20/2017   LDLDIRECT 149.6 01/26/2013   LDLCALC 88 05/20/2017   ALT 12 06/10/2017   AST 13 06/10/2017   NA 136 06/10/2017   K 5.2 (H) 06/10/2017   CL 104 06/10/2017   CREATININE 1.11 06/10/2017   BUN 20 06/10/2017   CO2 26 06/10/2017   TSH 3.82 06/10/2017   INR 0.96 02/17/2017   HGBA1C 8.4 11/26/2017   MICROALBUR 0.6 03/28/2016    Lab Results  Component Value Date   TSH 3.82 06/10/2017   Lab Results  Component Value Date   WBC 9.6 04/21/2017   HGB 11.9 (L) 04/21/2017   HCT 36.1 04/21/2017   MCV 87.0 04/21/2017   PLT 288 04/21/2017   Lab Results  Component Value Date   NA 136 06/10/2017   K 5.2 (H) 06/10/2017   CO2 26 06/10/2017   GLUCOSE 173 (H) 06/10/2017   BUN 20 06/10/2017   CREATININE 1.11 06/10/2017   BILITOT 0.5 06/10/2017   ALKPHOS 89 06/10/2017   AST 13 06/10/2017   ALT 12 06/10/2017   PROT 6.4 06/10/2017   ALBUMIN 3.9 06/10/2017   CALCIUM 9.4 06/10/2017   ANIONGAP 9 04/21/2017   GFR 51.68 (L) 06/10/2017   Lab Results  Component Value Date   CHOL 157 05/20/2017   Lab Results  Component Value Date   HDL 40.50 05/20/2017   Lab Results  Component Value Date   LDLCALC 88 05/20/2017   Lab Results  Component Value Date   TRIG 141.0 05/20/2017   Lab Results  Component Value Date   CHOLHDL 4 05/20/2017   Lab Results  Component Value Date   HGBA1C 8.4 11/26/2017         Assessment & Plan:   Problem List Items Addressed This Visit      Unprioritized   Bronchitis   Relevant Medications   amoxicillin-clavulanate (AUGMENTIN) 875-125 MG tablet   HYDROcodone-homatropine (HYCODAN) 5-1.5 MG/5ML syrup   Sinusitis - Primary   Relevant Medications   amoxicillin-clavulanate (AUGMENTIN) 875-125 MG tablet   HYDROcodone-homatropine (HYCODAN) 5-1.5 MG/5ML syrup    con't  flonase and mucinex Cough med for bed time  abx per orders   I am having Bethany Miller start on amoxicillin-clavulanate and HYDROcodone-homatropine. I am also having her maintain her Multiple Vitamins-Minerals (MULTIVITAMIN ADULT PO), Loratadine-Pseudoephedrine (CLARITIN-D 12 HOUR PO), omeprazole, ibuprofen, Melatonin, acetaminophen, docusate sodium, aspirin EC, glipiZIDE, glucose blood, accu-chek multiclix, citalopram, metFORMIN, lisinopril-hydrochlorothiazide, and atorvastatin.  Meds ordered this encounter  Medications  . amoxicillin-clavulanate (AUGMENTIN) 875-125 MG tablet    Sig: Take 1 tablet by mouth 2 (two) times daily.    Dispense:  20 tablet    Refill:  0  . HYDROcodone-homatropine (HYCODAN) 5-1.5 MG/5ML syrup    Sig: Take 5 mLs by mouth every 8 (eight) hours as needed for cough.    Dispense:  120 mL    Refill:  0    CMA served as scribe during this visit. History, Physical and Plan performed by medical provider. Documentation and orders reviewed and attested to.  Ann Held, DO

## 2017-12-24 NOTE — Patient Instructions (Signed)

## 2017-12-28 ENCOUNTER — Telehealth: Payer: Self-pay | Admitting: *Deleted

## 2017-12-28 ENCOUNTER — Other Ambulatory Visit: Payer: Self-pay | Admitting: Family Medicine

## 2017-12-28 MED ORDER — HYDROCOD POLST-CPM POLST ER 10-8 MG/5ML PO SUER: 5.0000 mL | Freq: Every evening | ORAL | 0 refills | Status: DC | PRN

## 2017-12-28 NOTE — Telephone Encounter (Signed)
[facility] sent a fax stating that Bethany Miller is on backorder.  Can you send in something else?

## 2017-12-28 NOTE — Telephone Encounter (Signed)
Sent tussionex

## 2018-01-29 DIAGNOSIS — L03032 Cellulitis of left toe: Secondary | ICD-10-CM | POA: Diagnosis not present

## 2018-03-02 ENCOUNTER — Encounter: Payer: Self-pay | Admitting: Family Medicine

## 2018-03-02 ENCOUNTER — Ambulatory Visit (INDEPENDENT_AMBULATORY_CARE_PROVIDER_SITE_OTHER): Payer: Medicare Other | Admitting: Family Medicine

## 2018-03-02 VITALS — BP 113/52 | HR 70 | Temp 98.0°F | Resp 16 | Ht 61.75 in | Wt 199.4 lb

## 2018-03-02 DIAGNOSIS — F32A Depression, unspecified: Secondary | ICD-10-CM

## 2018-03-02 DIAGNOSIS — R0789 Other chest pain: Secondary | ICD-10-CM | POA: Diagnosis not present

## 2018-03-02 DIAGNOSIS — F329 Major depressive disorder, single episode, unspecified: Secondary | ICD-10-CM | POA: Diagnosis not present

## 2018-03-02 DIAGNOSIS — K219 Gastro-esophageal reflux disease without esophagitis: Secondary | ICD-10-CM | POA: Diagnosis not present

## 2018-03-02 DIAGNOSIS — E669 Obesity, unspecified: Secondary | ICD-10-CM

## 2018-03-02 DIAGNOSIS — E1151 Type 2 diabetes mellitus with diabetic peripheral angiopathy without gangrene: Secondary | ICD-10-CM | POA: Diagnosis not present

## 2018-03-02 DIAGNOSIS — E1165 Type 2 diabetes mellitus with hyperglycemia: Secondary | ICD-10-CM | POA: Diagnosis not present

## 2018-03-02 DIAGNOSIS — F418 Other specified anxiety disorders: Secondary | ICD-10-CM

## 2018-03-02 DIAGNOSIS — IMO0002 Reserved for concepts with insufficient information to code with codable children: Secondary | ICD-10-CM

## 2018-03-02 MED ORDER — CITALOPRAM HYDROBROMIDE 40 MG PO TABS: ORAL_TABLET | ORAL | 1 refills | Status: DC

## 2018-03-02 MED ORDER — OMEPRAZOLE 20 MG PO CPDR: 20.0000 mg | DELAYED_RELEASE_CAPSULE | Freq: Every day | ORAL | 1 refills | Status: DC

## 2018-03-02 NOTE — Progress Notes (Signed)
Patient ID: Bethany Miller, female    DOB: Nov 28, 1945  Age: 72 y.o. MRN: [id number]    Subjective:  Subjective  HPI Bethany Miller presents for f/u depression.  She needs a refill on her celexa.  She also has pain and stiffness in R hand and a trigger finger in pinky and index finger.  Pt has had an injection for this in the past.  She also c/o numbness in R hand---  And fingers  She also c/o inc in reflux symptoms  She also had an episode of sob and chest tightness --about 2 weeks ago after bending over.  She sat down and it went away in 30-45 min and she was back to normal  Review of Systems  Constitutional: Negative for activity change, appetite change, chills and fever.  HENT: Negative for congestion.   Respiratory: Negative for shortness of breath.   Cardiovascular: Negative for chest pain, palpitations and leg swelling.  Gastrointestinal: Negative for abdominal distention, abdominal pain, blood in stool and nausea.  Genitourinary: Negative for difficulty urinating, dyspareunia, dysuria, flank pain, frequency, genital sores, hematuria, menstrual problem, pelvic pain, urgency, vaginal discharge and vaginal pain.  Musculoskeletal: Negative for back pain.  Skin: Negative for rash.  Allergic/Immunologic: Negative for environmental allergies.  Neurological: Negative for dizziness and headaches.  Psychiatric/Behavioral: The patient is not nervous/anxious.     History Past Medical History:  Diagnosis Date  . Diabetes mellitus   . Hyperlipidemia   . Hypertension   . 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   Her family history includes Alcohol abuse in her father; Arthritis in her mother, sister, and unknown relative; Cancer in her brother and brother; Cancer (age of onset: 83) in her brother; Cancer (age of onset: 2) in her  brother; Cirrhosis in her father; Heart disease in her brother and brother; Hyperlipidemia in her brother and brother; Hypertension in her brother and sister; Melanoma in her unknown relative; Throat cancer in her brother.She reports that she has never smoked. She has never used smokeless tobacco. She reports that she drinks alcohol. She reports that she does not use drugs.  Current Outpatient Medications on File Prior to Visit  Medication Sig Dispense Refill  . acetaminophen (TYLENOL) 650 MG CR tablet Take 650 mg by mouth every 8 (eight) hours as needed for pain.    Marland Kitchen aspirin EC 81 MG tablet Take 81 mg by mouth daily.    Marland Kitchen atorvastatin (LIPITOR) 40 MG tablet TAKE 1 TABLET (40 MG TOTAL) BY MOUTH DAILY. 90 tablet 0  . docusate sodium (COLACE) 100 MG capsule Take 1 capsule (100 mg total) by mouth 2 (two) times daily as needed for mild constipation. 30 capsule 1  . glipiZIDE (GLUCOTROL) 5 MG tablet Take 1 tablet (5 mg total) by mouth daily. 90 tablet 3  . glucose blood (ACCU-CHEK GUIDE) test strip Use as instructed to test once a day DX E11.51 100 each 1  . HYDROcodone-homatropine (HYCODAN) 5-1.5 MG/5ML syrup Take 5 mLs by mouth every 8 (eight) hours as needed for cough. 120 mL 0  . ibuprofen (ADVIL,MOTRIN) 800 MG tablet Take 800 mg by mouth 3 (three) times daily as needed for moderate pain.    . Lancets (ACCU-CHEK MULTICLIX) lancets Use as instructed to test once a day DX E11.51 100 each 1  . lisinopril-hydrochlorothiazide (PRINZIDE,ZESTORETIC) 10-12.5 MG tablet TAKE 1 TABLET  EVERY DAY 90 tablet 0  . Loratadine-Pseudoephedrine (CLARITIN-D 12 HOUR PO) Take 1 tablet by mouth daily as needed (sinus).     . Melatonin 3 MG TABS Take 3 mg by mouth at bedtime as needed (sleep).    . metFORMIN (GLUCOPHAGE-XR) 500 MG 24 hr tablet Take 2 tablets (1,000 mg total) by mouth 2 (two) times daily after a meal. 360 tablet 3  . Multiple Vitamins-Minerals (MULTIVITAMIN ADULT PO) Take 1 tablet by mouth daily.      No  current facility-administered medications on file prior to visit.      Objective:  Objective  Physical Exam  Constitutional: She is oriented to person, place, and time. She appears well-developed and well-nourished.  HENT:  Head: Normocephalic and atraumatic.  Eyes: Conjunctivae and EOM are normal.  Neck: Normal range of motion. Neck supple. No JVD present. Carotid bruit is not present. No thyromegaly present.  Cardiovascular: Normal rate, regular rhythm and normal heart sounds.  No murmur heard. Pulmonary/Chest: Effort normal and breath sounds normal. No respiratory distress. She has no wheezes. She has no rales. She exhibits no tenderness.  Musculoskeletal: She exhibits no edema.  Neurological: She is alert and oriented to person, place, and time.  Psychiatric: She has a normal mood and affect.  Nursing note and vitals reviewed.  BP (!) 113/52 (BP Location: Right Arm, Cuff Size: Large)   Pulse 70   Temp 98 F (36.7 C) (Oral)   Resp 16   Ht 5' 1.75" (1.568 m)   Wt 199 lb 6.4 oz (90.4 kg)   SpO2 94%   BMI 36.77 kg/m  Wt Readings from Last 3 Encounters:  03/02/18 199 lb 6.4 oz (90.4 kg)  12/24/17 204 lb 3.7 oz (92.6 kg)  11/26/17 201 lb (91.2 kg)     Lab Results  Component Value Date   WBC 9.6 04/21/2017   HGB 11.9 (L) 04/21/2017   HCT 36.1 04/21/2017   PLT 288 04/21/2017   GLUCOSE 173 (H) 06/10/2017   CHOL 157 05/20/2017   TRIG 141.0 05/20/2017   HDL 40.50 05/20/2017   LDLDIRECT 149.6 01/26/2013   LDLCALC 88 05/20/2017   ALT 12 06/10/2017   AST 13 06/10/2017   NA 136 06/10/2017   K 5.2 (H) 06/10/2017   CL 104 06/10/2017   CREATININE 1.11 06/10/2017   BUN 20 06/10/2017   CO2 26 06/10/2017   TSH 3.82 06/10/2017   INR 0.96 02/17/2017   HGBA1C 8.4 11/26/2017   MICROALBUR 0.6 03/28/2016    Mm Screening Breast Tomo Bilateral  Result Date: 10/16/2017 CLINICAL DATA:  Screening. EXAM: DIGITAL SCREENING BILATERAL MAMMOGRAM WITH TOMO AND CAD COMPARISON:  Previous  exam(s). ACR Breast Density Category b: There are scattered areas of fibroglandular density. FINDINGS: There are no findings suspicious for malignancy. Images were processed with CAD. IMPRESSION: No mammographic evidence of malignancy. A result letter of this screening mammogram will be mailed directly to the patient. RECOMMENDATION: Screening mammogram in one year. (Code:SM-B-01Y) BI-RADS CATEGORY  1: Negative. Electronically Signed   By: Everlean Alstrom M.D.   On: 10/16/2017 13:40     Assessment & Plan:  Plan  I have discontinued Bethany Miller's amoxicillin-clavulanate and chlorpheniramine-HYDROcodone. I am also having her maintain her Multiple Vitamins-Minerals (MULTIVITAMIN ADULT PO), Loratadine-Pseudoephedrine (CLARITIN-D 12 HOUR PO), ibuprofen, Melatonin, acetaminophen, docusate sodium, aspirin EC, glipiZIDE, glucose blood, accu-chek multiclix, metFORMIN, lisinopril-hydrochlorothiazide, atorvastatin, HYDROcodone-homatropine, citalopram, and omeprazole.  Meds ordered this encounter  Medications  . citalopram (CELEXA) 40 MG tablet  Sig: TAKE 1 TABLET (40 MG TOTAL) BY MOUTH DAILY.    Dispense:  90 tablet    Refill:  1  . omeprazole (PRILOSEC) 20 MG capsule    Sig: Take 1 capsule (20 mg total) by mouth daily.    Dispense:  90 capsule    Refill:  1    Problem List Items Addressed This Visit      Unprioritized   Atypical chest pain - Primary    ekg normal  ? From anxiety--  If chest pain returns rto or go to ER      Relevant Orders   EKG 12-Lead (Completed)   Depression with anxiety    Stable con't celexa       Relevant Medications   citalopram (CELEXA) 40 MG tablet   DM (diabetes mellitus) type II uncontrolled, periph vascular disorder ([place])    Per endo      Obesity (BMI 30-39.9)    Refer to healthy weight and wellness        Other Visit Diagnoses    Depression, unspecified depression type       Relevant Medications   citalopram (CELEXA) 40 MG tablet    Gastroesophageal reflux disease, esophagitis presence not specified       Relevant Medications   omeprazole (PRILOSEC) 20 MG capsule   Morbid obesity ([place])       Relevant Orders   Amb Ref to Medical Weight Management      Follow-up: Return in about 6 months (around 09/02/2018), or if symptoms worsen or fail to improve.  Ann Held, DO

## 2018-03-02 NOTE — Patient Instructions (Signed)
Trigger Finger Trigger finger (stenosing tenosynovitis) is a condition that causes a finger to get stuck in a bent position. Each finger has a tough, cord-like tissue that connects muscle to bone (tendon), and each tendon is surrounded by a tunnel of tissue (tendon sheath). To move your finger, your tendon needs to slide freely through the sheath. Trigger finger happens when the tendon or the sheath thickens, making it difficult to move your finger. Trigger finger can affect any finger or a thumb. It may affect more than one finger. Mild cases may clear up with rest and medicine. Severe cases require more treatment. What are the causes? Trigger finger is caused by a thickened finger tendon or tendon sheath. The cause of this thickening is not known. What increases the risk? The following factors may make you more likely to develop this condition:  Doing activities that require a strong grip.  Having rheumatoid arthritis, gout, or diabetes.  Being 40-60 years old.  Being a woman.  What are the signs or symptoms? Symptoms of this condition include:  Pain when bending or straightening your finger.  Tenderness or swelling where your finger attaches to the palm of your hand.  A lump in the palm of your hand or on the inside of your finger.  Hearing a popping sound when you try to straighten your finger.  Feeling a popping, catching, or locking sensation when you try to straighten your finger.  Being unable to straighten your finger.  How is this diagnosed? This condition is diagnosed based on your symptoms and a physical exam. How is this treated? This condition may be treated by:  Resting your finger and avoiding activities that make symptoms worse.  Wearing a finger splint to keep your finger in a slightly bent position.  Taking NSAIDs to relieve pain and swelling.  Injecting medicine (steroids) into the tendon sheath to reduce swelling and irritation. Injections may need to be  repeated.  Having surgery to open the tendon sheath. This may be done if other treatments do not work and you cannot straighten your finger. You may need physical therapy after surgery.  Follow these instructions at home:  Use moist heat to help reduce pain and swelling as told by your health care provider.  Rest your finger and avoid activities that make pain worse. Return to normal activities as told by your health care provider.  If you have a splint, wear it as told by your health care provider.  Take over-the-counter and prescription medicines only as told by your health care provider.  Keep all follow-up visits as told by your health care provider. This is important. Contact a health care provider if:  Your symptoms are not improving with home care. Summary  Trigger finger (stenosing tenosynovitis) causes your finger to get stuck in a bent position, and it can make it difficult and painful to straighten your finger.  This condition develops when a finger tendon or tendon sheath thickens.  Treatment starts with resting, wearing a splint, and taking NSAIDs.  In severe cases, surgery to open the tendon sheath may be needed. This information is not intended to replace advice given to you by your health care provider. Make sure you discuss any questions you have with your health care provider. Document Released: 04/19/2004 Document Revised: 06/10/2016 Document Reviewed: 06/10/2016 Elsevier Interactive Patient Education  2017 [employer]  

## 2018-03-03 ENCOUNTER — Encounter: Payer: Self-pay | Admitting: Family Medicine

## 2018-03-03 DIAGNOSIS — R0789 Other chest pain: Secondary | ICD-10-CM | POA: Insufficient documentation

## 2018-03-03 NOTE — Assessment & Plan Note (Signed)
ekg normal  ? From anxiety--  If chest pain returns rto or go to ER

## 2018-03-03 NOTE — Assessment & Plan Note (Signed)
Stable con't celexa

## 2018-03-03 NOTE — Assessment & Plan Note (Signed)
Per endo °

## 2018-03-03 NOTE — Assessment & Plan Note (Signed)
-   Refer to healthy weight and wellness 

## 2018-03-16 ENCOUNTER — Other Ambulatory Visit: Payer: Self-pay | Admitting: Family Medicine

## 2018-03-16 DIAGNOSIS — I10 Essential (primary) hypertension: Secondary | ICD-10-CM

## 2018-03-16 DIAGNOSIS — E785 Hyperlipidemia, unspecified: Secondary | ICD-10-CM

## 2018-03-31 ENCOUNTER — Ambulatory Visit (INDEPENDENT_AMBULATORY_CARE_PROVIDER_SITE_OTHER): Payer: Medicare Other | Admitting: Internal Medicine

## 2018-03-31 ENCOUNTER — Encounter: Payer: Self-pay | Admitting: Internal Medicine

## 2018-03-31 VITALS — BP 120/60 | HR 55 | Ht 61.75 in | Wt 199.0 lb

## 2018-03-31 DIAGNOSIS — IMO0002 Reserved for concepts with insufficient information to code with codable children: Secondary | ICD-10-CM

## 2018-03-31 DIAGNOSIS — Z23 Encounter for immunization: Secondary | ICD-10-CM

## 2018-03-31 DIAGNOSIS — E785 Hyperlipidemia, unspecified: Secondary | ICD-10-CM | POA: Diagnosis not present

## 2018-03-31 DIAGNOSIS — E669 Obesity, unspecified: Secondary | ICD-10-CM | POA: Diagnosis not present

## 2018-03-31 DIAGNOSIS — E1165 Type 2 diabetes mellitus with hyperglycemia: Secondary | ICD-10-CM

## 2018-03-31 DIAGNOSIS — E1151 Type 2 diabetes mellitus with diabetic peripheral angiopathy without gangrene: Secondary | ICD-10-CM

## 2018-03-31 LAB — GLUCOSE, POCT (MANUAL RESULT ENTRY): POC Glucose: 218 mg/dl — AB (ref 70–99)

## 2018-03-31 LAB — POCT GLYCOSYLATED HEMOGLOBIN (HGB A1C): Hemoglobin A1C: 9.8 % — AB (ref 4.0–5.6)

## 2018-03-31 MED ORDER — GLIPIZIDE 5 MG PO TABS: 5.0000 mg | ORAL_TABLET | Freq: Two times a day (BID) | ORAL | 3 refills | Status: DC

## 2018-03-31 NOTE — Progress Notes (Signed)
Patient ID: Bethany Miller, female   DOB: January 29, 1946, 72 y.o.   MRN: [id number]  HPI: Bethany Miller is a 72 y.o.-year-old female, returning for f/u for DM2, dx 2010, non-insulin-dependent, uncontrolled, with complications (CKD). Last visit 4 months ago. She has [employer] part D for meds, [employer], and [employer] for the rest.   Since last OV, sugars are higher: She was less active, with less organized meals, and she was also taking care of her son who had back surgery.  She also did not take glipizide before dinner as advised at last visit.  She is only taking this with a particularly carb-laden meal, approximately once a week.   Last hemoglobin A1c was: Lab Results  Component Value Date   HGBA1C 8.4 11/26/2017   HGBA1C 7.0 07/30/2017   HGBA1C 7.6 (H) 04/21/2017    Pt is on a regimen of: - Metformin ER 1000 mg twice a day (we try to use 2000 mg at night but this did not make a difference) - Glipizide 2.5-5 mg before dinner - restarted 11/2017 - only taking this rarely She could not restartJardiance  - tried to add this back in 04/2017. Stopped  Glipizide 5 mg 2x a day 06/2017 b/c lows. Tradjenta and Alogliptin were not covered She was previously on Invokana, Jardiance 10 mg daily >> too expensive. Tried Cycloset >> too expensive Tried Januvia >>  too expensive.  Tried Metformin 500 mg po >> severe diarrhea. She was on Glimepiride >> hypoglycemia in the 60's on 2 mg daily. She had rapid acting insulin with steroid shots before.   Pt checks her sugars twice a day: - am: 132-179 >> 95-146 >> 130-140 >> 189-200s - 2h after b'fast: 97, 164, 173 >> n/c - Lunch:  low 100s >> n/c >> 163 >> n/c - 2h after lunch: 160 >> n/c - Dinnertime:  83, 103 >> 57, 143-167 >> n/c - 2h after dinner:150s, rarely 200s >> up to 200 >> 200s Lowest sugar was 56 x1 >> 60s >> 67 >> 189;  she has hypoglycemia awareness in the 90s. Highest sugar was 281 >> 321  ReliOn meter.  -+ Mild CKD, last  BUN/creatinine:  Lab Results  Component Value Date   BUN 20 06/10/2017   CREATININE 1.11 06/10/2017   Lab Results  Component Value Date   MICRALBCREAT 4 03/28/2016   MICRALBCREAT 0.6 11/16/2015   MICRALBCREAT 0.6 01/26/2013   MICRALBCREAT 0.3 03/24/2012   MICRALBCREAT 0.4 03/18/2011   MICRALBCREAT 11.4 10/29/2009   MICRALBCREAT 3.4 04/09/2009   MICRALBCREAT 17.1 01/25/2009   Last GFR lower: Lab Results  Component Value Date   GFRNONAA 45 (L) 04/21/2017   GFRNONAA 54 (L) 02/27/2017   GFRNONAA 35 (L) 02/17/2017   GFRNONAA 52.79 (L) 06/19/2010   GFRNONAA 59.68 10/29/2009   GFRNONAA 53.52 07/03/2009   GFRNONAA 59.78 04/09/2009   GFRNONAA 59.82 01/25/2009   GFRNONAA 78 05/08/2008   GFRNONAA 68 01/17/2008   -+ HL; last set of lipids: Lab Results  Component Value Date   CHOL 157 05/20/2017   HDL 40.50 05/20/2017   LDLCALC 88 05/20/2017   LDLDIRECT 149.6 01/26/2013   TRIG 141.0 05/20/2017   CHOLHDL 4 05/20/2017  On Lipitor. - last eye exam was in 2019: No DR - no numbness and tingling in her feet.  She sees podiatry for ingrown toenails.  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  ROS: Constitutional: no weight gain/no weight loss, no fatigue, no subjective  hyperthermia, no subjective hypothermia Eyes: no blurry vision, no xerophthalmia ENT: no sore throat, no nodules palpated in throat, no dysphagia, no odynophagia, no hoarseness Cardiovascular: no CP/no SOB/no palpitations/no leg swelling Respiratory: no cough/no SOB/no wheezing Gastrointestinal: no N/no V/no D/no C/no acid reflux Musculoskeletal: no muscle aches/no joint aches Skin: no rashes, no hair loss Neurological: no tremors/no numbness/no tingling/no dizziness  I reviewed pt's medications, allergies, PMH, social hx, family hx, and changes were documented in the history of present illness. Otherwise, unchanged from my initial visit note.  Past Medical History:  Diagnosis Date  . Diabetes  mellitus   . Hyperlipidemia   . Hypertension   . Murmur    Past Surgical History:  Procedure Laterality Date  . ABDOMINAL HYSTERECTOMY    . BACK SURGERY    . CHOLECYSTECTOMY    . TONSILLECTOMY    . TOTAL KNEE ARTHROPLASTY    . TOTAL KNEE ARTHROPLASTY Right 02/26/2017   Procedure: RIGHT TOTAL KNEE ARTHROPLASTY;  Surgeon: Susa Day, MD;  Location: [facility];  Service: Orthopedics;  Laterality: Right;   Social History   Socioeconomic History  . Marital status: Widowed    Spouse name: Not on file  . Number of children: Not on file  . Years of education: Not on file  . Highest education level: Not on file  Occupational History  . Not on file  Social Needs  . Financial resource strain: Not on file  . Food insecurity:    Worry: Not on file    Inability: Not on file  . Transportation needs:    Medical: Not on file    Non-medical: Not on file  Tobacco Use  . Smoking status: Never Smoker  . Smokeless tobacco: Never Used  Substance and Sexual Activity  . Alcohol use: Yes    Comment: rare  . Drug use: No  . Sexual activity: Not Currently    Partners: Male  Lifestyle  . Physical activity:    Days per week: Not on file    Minutes per session: Not on file  . Stress: Not on file  Relationships  . Social connections:    Talks on phone: Not on file    Gets together: Not on file    Attends religious service: Not on file    Active member of club or organization: Not on file    Attends meetings of clubs or organizations: Not on file    Relationship status: Not on file  . Intimate partner violence:    Fear of current or ex partner: Not on file    Emotionally abused: Not on file    Physically abused: Not on file    Forced sexual activity: Not on file  Other Topics Concern  . 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 acetaminophen (TYLENOL) 650 MG CR tablet Take 650 mg by mouth every 8 (eight) hours as needed for pain.    Marland Kitchen aspirin EC 81 MG tablet Take 81 mg by mouth daily.    Marland Kitchen atorvastatin (LIPITOR) 40 MG tablet TAKE 1 TABLET EVERY DAY 90 tablet 0  . citalopram (CELEXA) 40 MG tablet TAKE 1 TABLET (40 MG TOTAL) BY MOUTH DAILY. 90 tablet 1  . docusate sodium (COLACE) 100 MG capsule Take 1 capsule (100 mg total) by mouth 2 (  two) times daily as needed for mild constipation. 30 capsule 1  . glipiZIDE (GLUCOTROL) 5 MG tablet Take 1 tablet (5 mg total) by mouth daily. 90 tablet 3  . glucose blood (ACCU-CHEK GUIDE) test strip Use as instructed to test once a day DX E11.51 100 each 1  . HYDROcodone-homatropine (HYCODAN) 5-1.5 MG/5ML syrup Take 5 mLs by mouth every 8 (eight) hours as needed for cough. 120 mL 0  . ibuprofen (ADVIL,MOTRIN) 800 MG tablet Take 800 mg by mouth 3 (three) times daily as needed for moderate pain.    . Lancets (ACCU-CHEK MULTICLIX) lancets Use as instructed to test once a day DX E11.51 100 each 1  . lisinopril-hydrochlorothiazide (PRINZIDE,ZESTORETIC) 10-12.5 MG tablet TAKE 1 TABLET EVERY DAY 90 tablet 0  . Loratadine-Pseudoephedrine (CLARITIN-D 12 HOUR PO) Take 1 tablet by mouth daily as needed (sinus).     . Melatonin 3 MG TABS Take 3 mg by mouth at bedtime as needed (sleep).    . metFORMIN (GLUCOPHAGE-XR) 500 MG 24 hr tablet Take 2 tablets (1,000 mg total) by mouth 2 (two) times daily after a meal. 360 tablet 3  . Multiple Vitamins-Minerals (MULTIVITAMIN ADULT PO) Take 1 tablet by mouth daily.     Marland Kitchen omeprazole (PRILOSEC) 20 MG capsule Take 1 capsule (20 mg total) by mouth daily. 90 capsule 1   No current facility-administered medications on file prior to visit.    No Known Allergies Family History  Problem Relation Age of Onset  . Throat cancer Brother   . Heart disease Brother        MI  . Cancer Brother 80       throat,   . Hyperlipidemia Brother   . Cirrhosis Father    . Alcohol abuse Father   . Heart disease Brother   . Cancer Brother   . Arthritis Mother   . Hypertension Sister   . Arthritis Sister   . Cancer Brother   . Cancer Brother 8       melanoma  . Hypertension Brother   . Hyperlipidemia Brother   . Melanoma Unknown   . Arthritis Unknown     PE: BP 120/60   Pulse (!) 55   Ht 5' 1.75" (1.568 m)   Wt 199 lb (90.3 kg)   SpO2 97%   BMI 36.69 kg/m  Wt Readings from Last 3 Encounters:  03/31/18 199 lb (90.3 kg)  03/02/18 199 lb 6.4 oz (90.4 kg)  12/24/17 204 lb 3.7 oz (92.6 kg)   Constitutional: overweight, in NAD Eyes: PERRLA, EOMI, no exophthalmos ENT: moist mucous membranes, no thyromegaly, no cervical lymphadenopathy Cardiovascular: RRR, No MRG Respiratory: CTA B Gastrointestinal: abdomen soft, NT, ND, BS+ Musculoskeletal: no deformities, strength intact in all 4 Skin: moist, warm, no rashes Neurological: no tremor with outstretched hands, DTR normal in all 4   ASSESSMENT: 1. DM2, non-insulin-dependent, uncontrolled, with co: - CKD  2. HL   3. obesity  PLAN:  1. Patient with history of uncontrolled diabetes, on oral antidiabetic regimen with metformin and glipizide added back at last visit.  We tried to add Jardiance in the past, however, she could not afford this.  She did change her diet in the past and she developed low blood sugars in the 60s after which we stopped the glipizide, however, at last visit, sugars are higher especially after large dinner so we added glipizide back before dinner.  We also discussed about cutting down portions and avoiding snacking at night. -At this  visit, sugars are higher (multiple reasons, see HPI).  She did not add back glipizide as advised at last visit, as she thought that she only needed to do it before large meals.  We will add this back at 5 mg before breakfast and dinner.  We discussed that this is most likely not enough and she will need another medicine, but she would like to  try to work on her diet and exercise first and also to see how glipizide will work for her. - I advised her to send me her sugars in 1 month to see how they are doing and see if we need to intensify the regimen at that time - I suggested to:  Patient Instructions  Please continue: - Metformin ER 1000 mg 2x a day  Start: - Glipizide 5 mg before b'fast and dinner  Please come back for a follow-up appointment in 3 months.  - today, HbA1c is 9.8% (higher) - continue checking sugars at different times of the day - check 1x a day, rotating checks - advised for yearly eye exams >> she is UTD - Given flu shot today - Return to clinic in 3 mo with sugar log     2. HL - Reviewed latest lipid panel from 05/2017: LDL much improved after starting Lipitor Lab Results  Component Value Date   CHOL 157 05/20/2017   HDL 40.50 05/20/2017   LDLCALC 88 05/20/2017   LDLDIRECT 149.6 01/26/2013   TRIG 141.0 05/20/2017   CHOLHDL 4 05/20/2017  - Continues Lipitor without side effects.  3.  Obesity -Dropped 5 pounds since last visit -I suggested dietary changes -I offered to refer her to nutrition, but she is waiting to get into a wellness program as arranged by PCP.  Philemon Kingdom, MD PhD [facility] Endocrinology

## 2018-03-31 NOTE — Patient Instructions (Addendum)
Please continue: - Metformin ER 1000 mg 2x a day  Please start: - Glipizide 5 mg before breakfast and dinner.  Please let me know about the sugars in 4 weeks.  Please come back for a follow-up appointment in 3 months.

## 2018-05-28 ENCOUNTER — Other Ambulatory Visit: Payer: Self-pay | Admitting: Internal Medicine

## 2018-05-28 ENCOUNTER — Encounter: Payer: Self-pay | Admitting: Internal Medicine

## 2018-05-28 MED ORDER — EMPAGLIFLOZIN 10 MG PO TABS: 10.0000 mg | ORAL_TABLET | Freq: Every day | ORAL | 5 refills | Status: DC

## 2018-05-28 NOTE — Telephone Encounter (Signed)
Bethany Miller is not covered by her insurance.

## 2018-06-04 ENCOUNTER — Telehealth: Payer: Self-pay

## 2018-06-04 MED ORDER — GLIPIZIDE 10 MG PO TABS: 10.0000 mg | ORAL_TABLET | Freq: Two times a day (BID) | ORAL | 3 refills | Status: DC

## 2018-06-04 NOTE — Telephone Encounter (Signed)
Jardiance costs too much for patient Dr. Cruzita Lederer said to send in Glipizide 10 MG BID.  New RX sent

## 2018-06-15 ENCOUNTER — Encounter: Payer: Self-pay | Admitting: Internal Medicine

## 2018-06-23 ENCOUNTER — Telehealth: Payer: Self-pay | Admitting: Internal Medicine

## 2018-06-23 ENCOUNTER — Encounter: Payer: Self-pay | Admitting: Internal Medicine

## 2018-06-24 NOTE — Telephone Encounter (Signed)
error 

## 2018-06-28 ENCOUNTER — Ambulatory Visit: Payer: Medicare Other | Admitting: Internal Medicine

## 2018-08-26 ENCOUNTER — Ambulatory Visit (INDEPENDENT_AMBULATORY_CARE_PROVIDER_SITE_OTHER): Payer: Medicare Other | Admitting: Internal Medicine

## 2018-08-26 ENCOUNTER — Ambulatory Visit: Payer: Medicare Other | Admitting: Internal Medicine

## 2018-08-26 ENCOUNTER — Encounter: Payer: Self-pay | Admitting: Internal Medicine

## 2018-08-26 VITALS — BP 150/80 | HR 72 | Ht 67.75 in | Wt 206.0 lb

## 2018-08-26 DIAGNOSIS — IMO0002 Reserved for concepts with insufficient information to code with codable children: Secondary | ICD-10-CM

## 2018-08-26 DIAGNOSIS — E1165 Type 2 diabetes mellitus with hyperglycemia: Secondary | ICD-10-CM

## 2018-08-26 DIAGNOSIS — E1151 Type 2 diabetes mellitus with diabetic peripheral angiopathy without gangrene: Secondary | ICD-10-CM

## 2018-08-26 DIAGNOSIS — E785 Hyperlipidemia, unspecified: Secondary | ICD-10-CM | POA: Diagnosis not present

## 2018-08-26 DIAGNOSIS — E669 Obesity, unspecified: Secondary | ICD-10-CM

## 2018-08-26 LAB — POCT GLYCOSYLATED HEMOGLOBIN (HGB A1C): HEMOGLOBIN A1C: 9.5 % — AB (ref 4.0–5.6)

## 2018-08-26 MED ORDER — SEMAGLUTIDE(0.25 OR 0.5MG/DOS) 2 MG/1.5ML ~~LOC~~ SOPN: 0.5000 mg | PEN_INJECTOR | SUBCUTANEOUS | 5 refills | Status: DC

## 2018-08-26 NOTE — Progress Notes (Signed)
Patient ID: Bethany Miller, female   DOB: Nov 16, 1945, 73 y.o.   MRN: [id number]  HPI: Bethany Miller is a 73 y.o.-year-old female, returning for f/u for DM2, dx 2010, non-insulin-dependent, uncontrolled, with complications (CKD). Last visit 5 months ago. She has [employer] part D for meds, [place], and [employer] for the rest.   At last visit, sugars were higher as she was less active, had less organized meals and she was taking care of her son who had back surgery.  At this visit, sugars are still high as she was not able to start Jardiance due to price.   Last hemoglobin A1c was: Lab Results  Component Value Date   HGBA1C 9.8 (A) 03/31/2018   HGBA1C 8.4 11/26/2017   HGBA1C 7.0 07/30/2017    Pt is on a regimen of: - Metformin ER 1000 mg 2x a day (we try to use 2000 mg at night but this did not make a difference) - Glipizide 5 mg 2x a day before breakfast and dinner We again tried Jardiance  - added again 04/2018 >> not affordable Stopped  Glipizide 5 mg 2x a day 06/2017 b/c lows. Tradjenta and Alogliptin were not covered She was previously on Invokana, Jardiance 10 mg daily >> too expensive. Tried Cycloset >> too expensive Tried Januvia >>  too expensive.  Tried Metformin 500 mg po >> severe diarrhea. She was on Glimepiride >> hypoglycemia in the 60's on 2 mg daily. She had rapid acting insulin with steroid shots before.   Pt checks her sugars twice a day: - am:  95-146 >> 130-140 >> 189-200s >> 85x1, 120-285, 300 - 2h after b'fast: 97, 164, 173 >> n/c - Lunch:  low 100s >> n/c >> 163 >> n/c - 2h after lunch: 160 >> n/c - Dinnertime:  83, 103 >> 57, 143-167 >> n/c - 2h after dinner: up to 200 >> 200s >> 128-250 Lowest sugar was 56 x1 >> .Marland Kitchen.189 >> 85;  she has hypoglycemia awareness in the 90s. Highest sugar was 281 >> 321 >> 300.  ReliOn meter.  -+ Mild CKD, last BUN/creatinine:  Lab Results  Component Value Date   BUN 20 06/10/2017   CREATININE 1.11 06/10/2017   No  MAU: Lab Results  Component Value Date   MICRALBCREAT 4 03/28/2016   MICRALBCREAT 0.6 11/16/2015   MICRALBCREAT 0.6 01/26/2013   MICRALBCREAT 0.3 03/24/2012   MICRALBCREAT 0.4 03/18/2011   MICRALBCREAT 11.4 10/29/2009   MICRALBCREAT 3.4 04/09/2009   MICRALBCREAT 17.1 01/25/2009   GFR decreased: Lab Results  Component Value Date   GFRNONAA 45 (L) 04/21/2017   GFRNONAA 54 (L) 02/27/2017   GFRNONAA 35 (L) 02/17/2017   GFRNONAA 52.79 (L) 06/19/2010   GFRNONAA 59.68 10/29/2009   GFRNONAA 53.52 07/03/2009   GFRNONAA 59.78 04/09/2009   GFRNONAA 59.82 01/25/2009   GFRNONAA 78 05/08/2008   GFRNONAA 68 01/17/2008   -+ HL; last set of lipids: Lab Results  Component Value Date   CHOL 157 05/20/2017   HDL 40.50 05/20/2017   LDLCALC 88 05/20/2017   LDLDIRECT 149.6 01/26/2013   TRIG 141.0 05/20/2017   CHOLHDL 4 05/20/2017  On Lipitor. - last eye exam was in 2019: No DR -No numbness and tingling in her feet.  She sees podiatry for ingrown toenails.  She also has a history of HTN. 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  ROS: Constitutional: + weight gain/no weight loss, no fatigue, no subjective hyperthermia, no subjective hypothermia Eyes: no blurry vision, no xerophthalmia ENT:  no sore throat, no nodules palpated in neck, no dysphagia, no odynophagia, no hoarseness Cardiovascular: no CP/no SOB/no palpitations/no leg swelling Respiratory: no cough/no SOB/no wheezing Gastrointestinal: no N/no V/no D/no C/no acid reflux Musculoskeletal: no muscle aches/no joint aches Skin: no rashes, no hair loss Neurological: no tremors/no numbness/no tingling/no dizziness  I reviewed pt's medications, allergies, PMH, social hx, family hx, and changes were documented in the history of present illness. Otherwise, unchanged from my initial visit note.  Past Medical History:  Diagnosis Date  . Diabetes mellitus   . Hyperlipidemia   . Hypertension   . Murmur    Past Surgical History:  Procedure Laterality  Date  . ABDOMINAL HYSTERECTOMY    . BACK SURGERY    . CHOLECYSTECTOMY    . TONSILLECTOMY    . TOTAL KNEE ARTHROPLASTY    . TOTAL KNEE ARTHROPLASTY Right 02/26/2017   Procedure: RIGHT TOTAL KNEE ARTHROPLASTY;  Surgeon: Susa Day, MD;  Location: [facility];  Service: Orthopedics;  Laterality: Right;   Social History   Socioeconomic History  . Marital status: Widowed    Spouse name: Not on file  . Number of children: Not on file  . Years of education: Not on file  . Highest education level: Not on file  Occupational History  . Not on file  Social Needs  . Financial resource strain: Not on file  . Food insecurity:    Worry: Not on file    Inability: Not on file  . Transportation needs:    Medical: Not on file    Non-medical: Not on file  Tobacco Use  . Smoking status: Never Smoker  . Smokeless tobacco: Never Used  Substance and Sexual Activity  . Alcohol use: Yes    Comment: rare  . Drug use: No  . Sexual activity: Not Currently    Partners: Male  Lifestyle  . Physical activity:    Days per week: Not on file    Minutes per session: Not on file  . Stress: Not on file  Relationships  . Social connections:    Talks on phone: Not on file    Gets together: Not on file    Attends religious service: Not on file    Active member of club or organization: Not on file    Attends meetings of clubs or organizations: Not on file    Relationship status: Not on file  . Intimate partner violence:    Fear of current or ex partner: Not on file    Emotionally abused: Not on file    Physically abused: Not on file    Forced sexual activity: Not on file  Other Topics Concern  . 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 acetaminophen (TYLENOL) 650 MG CR tablet Take 650 mg by mouth every 8 (eight) hours as needed for  pain.    Marland Kitchen aspirin EC 81 MG tablet Take 81 mg by mouth daily.    Marland Kitchen atorvastatin (LIPITOR) 40 MG tablet TAKE 1 TABLET EVERY DAY 90 tablet 0  . citalopram (CELEXA) 40 MG tablet TAKE 1 TABLET (40 MG TOTAL) BY MOUTH DAILY. 90 tablet 1  . docusate sodium (COLACE) 100 MG capsule Take 1 capsule (100 mg total) by mouth 2 (two) times daily as needed for mild constipation. 30 capsule 1  .  glipiZIDE (GLUCOTROL) 10 MG tablet Take 1 tablet (10 mg total) by mouth 2 (two) times daily before a meal. 60 tablet 3  . glucose blood (ACCU-CHEK GUIDE) test strip Use as instructed to test once a day DX E11.51 100 each 1  . HYDROcodone-homatropine (HYCODAN) 5-1.5 MG/5ML syrup Take 5 mLs by mouth every 8 (eight) hours as needed for cough. 120 mL 0  . ibuprofen (ADVIL,MOTRIN) 800 MG tablet Take 800 mg by mouth 3 (three) times daily as needed for moderate pain.    . Lancets (ACCU-CHEK MULTICLIX) lancets Use as instructed to test once a day DX E11.51 100 each 1  . lisinopril-hydrochlorothiazide (PRINZIDE,ZESTORETIC) 10-12.5 MG tablet TAKE 1 TABLET EVERY DAY 90 tablet 0  . Loratadine-Pseudoephedrine (CLARITIN-D 12 HOUR PO) Take 1 tablet by mouth daily as needed (sinus).     . Melatonin 3 MG TABS Take 3 mg by mouth at bedtime as needed (sleep).    . metFORMIN (GLUCOPHAGE-XR) 500 MG 24 hr tablet Take 2 tablets (1,000 mg total) by mouth 2 (two) times daily after a meal. 360 tablet 3  . Multiple Vitamins-Minerals (MULTIVITAMIN ADULT PO) Take 1 tablet by mouth daily.     Marland Kitchen omeprazole (PRILOSEC) 20 MG capsule Take 1 capsule (20 mg total) by mouth daily. 90 capsule 1   No current facility-administered medications on file prior to visit.    No Known Allergies Family History  Problem Relation Age of Onset  . Throat cancer Brother   . Heart disease Brother        MI  . Cancer Brother 80       throat,   . Hyperlipidemia Brother   . Cirrhosis Father   . Alcohol abuse Father   . Heart disease Brother   . Cancer Brother   .  Arthritis Mother   . Hypertension Sister   . Arthritis Sister   . Cancer Brother   . Cancer Brother 71       melanoma  . Hypertension Brother   . Hyperlipidemia Brother   . Melanoma Unknown   . Arthritis Unknown     PE: BP (!) 150/80   Pulse 72   Ht 5' 7.75" (1.721 m)   Wt 206 lb (93.4 kg)   SpO2 96%   BMI 31.55 kg/m  Wt Readings from Last 3 Encounters:  08/26/18 206 lb (93.4 kg)  03/31/18 199 lb (90.3 kg)  03/02/18 199 lb 6.4 oz (90.4 kg)   Constitutional: overweight, in NAD Eyes: PERRLA, EOMI, no exophthalmos ENT: moist mucous membranes, no thyromegaly, no cervical lymphadenopathy Cardiovascular: RRR, No MRG Respiratory: CTA B Gastrointestinal: abdomen soft, NT, ND, BS+ Musculoskeletal: no deformities, strength intact in all 4 Skin: moist, warm, no rashes Neurological: no tremor with outstretched hands, DTR normal in all 4  ASSESSMENT: 1. DM2, non-insulin-dependent, uncontrolled, with long-term complications: - CKD  2. HL   3. obesity  PLAN:  1. Patient with history of uncontrolled diabetes, on oral antidiabetic regimen with metformin, glipizide, and SGLT 2 inhibitor added at last visit.  We also tried Jardiance in the past but she could not afford it.  We tried it again since last visit and this was again expensive.  Of note, in the past, she did change her diet and developed low blood sugars and she could come off the sulfonylurea, however, we had to add this back after she relaxed her diet.  We did discuss about improving her diet at last visit and avoiding snacking at night.  At that time, sugars are higher and HbA1c was also higher, at 9.8% -At this visit, sugars are still very high, but surprisingly, they are quite fluctuating.  She is not aware why her sugars are still different.  I suspect this is related to meals and especially sweets. -We discussed about options for treatment.  We will try to add a GLP-1 receptor agonist but if this is not affordable (I gave  her different options to check with her insurance), and will need to start long-acting insulin. - I suggested to:  Patient Instructions  Please continue: - Metformin ER 1000 mg 2x a day - Glipizide 5 mg before breakfast and dinner  Please start Ozempic 0.25 mg weekly in a.m. (for example on Sunday morning) x 4 weeks, then increase to 0.5 mg weekly in a.m. if no nausea or hypoglycemia.  If Ozempic is not affordable, we can try Trulicity, Bydureon, Victoza.  If none are affordable, start: - Lantus 12 units at bedtime - increase by 3 units every 3 days until am sugars <140 or you reach 30 units  Please come back for a follow-up appointment in 3 months.  - today, HbA1c is 9.5% (slightly better) - continue checking sugars at different times of the day - check 1x a day, rotating checks - advised for yearly eye exams >> she is UTD - Return to clinic in 3 mo with sugar log      2. HL - Reviewed latest lipid panel from 05/2018: LDL improved from 2014, the rest of the fractions also at goal Lab Results  Component Value Date   CHOL 157 05/20/2017   HDL 40.50 05/20/2017   LDLCALC 88 05/20/2017   LDLDIRECT 149.6 01/26/2013   TRIG 141.0 05/20/2017   CHOLHDL 4 05/20/2017  - Continues Lipitor without side effects. -She needs a new lipid panel but she would like to defer until she sees her PCP for an annual physical exam  3.  Obesity -At last visit, I suggested dietary changes and also offered to refer her to nutrition.  However, at that time she was waiting to get into a wellness program as arranged by PCP.  She did not start this yet, but she is thinking about another program offered by [facility] 7 pounds since last visit  Philemon Kingdom, MD PhD [facility] Endocrinology

## 2018-08-26 NOTE — Addendum Note (Signed)
Addended by: Cardell Peach I on: 08/26/2018 03:09 PM   Modules accepted: Orders

## 2018-08-26 NOTE — Patient Instructions (Addendum)
Please continue: - Metformin ER 1000 mg 2x a day - Glipizide 5 mg before breakfast and dinner  Please start Ozempic 0.25 mg weekly in a.m. (for example on Sunday morning) x 4 weeks, then increase to 0.5 mg weekly in a.m. if no nausea or hypoglycemia.  If Ozempic is not affordable, we can try Trulicity, Bydureon, Victoza.  If none are affordable, start: - Lantus 12 units at bedtime - increase by 3 units every 3 days until am sugars <140 or you reach 30 units  Please come back for a follow-up appointment in 3 months.

## 2018-08-31 ENCOUNTER — Encounter: Payer: Self-pay | Admitting: Internal Medicine

## 2018-08-31 ENCOUNTER — Other Ambulatory Visit: Payer: Self-pay | Admitting: Family Medicine

## 2018-08-31 DIAGNOSIS — F329 Major depressive disorder, single episode, unspecified: Secondary | ICD-10-CM

## 2018-08-31 DIAGNOSIS — F32A Depression, unspecified: Secondary | ICD-10-CM

## 2018-08-31 DIAGNOSIS — K219 Gastro-esophageal reflux disease without esophagitis: Secondary | ICD-10-CM

## 2018-08-31 NOTE — Telephone Encounter (Signed)
Forms placed on Dr. Cruzita Lederer desk.

## 2018-09-09 ENCOUNTER — Other Ambulatory Visit: Payer: Self-pay | Admitting: Internal Medicine

## 2018-09-09 ENCOUNTER — Other Ambulatory Visit: Payer: Self-pay

## 2018-09-09 MED ORDER — GLUCOSE BLOOD VI STRP: ORAL_STRIP | 4 refills | Status: AC

## 2018-09-20 ENCOUNTER — Encounter: Payer: Self-pay | Admitting: Family Medicine

## 2018-09-22 ENCOUNTER — Encounter: Payer: Self-pay | Admitting: Family Medicine

## 2018-09-22 NOTE — Telephone Encounter (Signed)
We can see her tomorrow or she can do an e visit

## 2018-09-23 NOTE — Telephone Encounter (Signed)
Would you call her

## 2018-09-27 ENCOUNTER — Ambulatory Visit (INDEPENDENT_AMBULATORY_CARE_PROVIDER_SITE_OTHER): Payer: Medicare Other | Admitting: Family Medicine

## 2018-09-27 ENCOUNTER — Other Ambulatory Visit: Payer: Self-pay

## 2018-09-27 ENCOUNTER — Ambulatory Visit (HOSPITAL_BASED_OUTPATIENT_CLINIC_OR_DEPARTMENT_OTHER)
Admission: RE | Admit: 2018-09-27 | Discharge: 2018-09-27 | Disposition: A | Discharge status: Home / Self Care | Payer: Medicare Other | Source: Ambulatory Visit | Attending: Family Medicine | Admitting: Family Medicine

## 2018-09-27 VITALS — BP 106/60 | HR 72 | Temp 98.1°F | Resp 16 | Ht 61.75 in | Wt 206.6 lb

## 2018-09-27 DIAGNOSIS — J0140 Acute pansinusitis, unspecified: Secondary | ICD-10-CM

## 2018-09-27 DIAGNOSIS — J40 Bronchitis, not specified as acute or chronic: Secondary | ICD-10-CM

## 2018-09-27 DIAGNOSIS — R05 Cough: Secondary | ICD-10-CM | POA: Diagnosis not present

## 2018-09-27 MED ORDER — AZITHROMYCIN 250 MG PO TABS: ORAL_TABLET | ORAL | 0 refills | Status: DC

## 2018-09-27 MED ORDER — HYDROCODONE-HOMATROPINE 5-1.5 MG/5ML PO SYRP: 5.0000 mL | ORAL_SOLUTION | Freq: Three times a day (TID) | ORAL | 0 refills | Status: DC | PRN

## 2018-09-27 NOTE — Patient Instructions (Signed)

## 2018-09-28 NOTE — Progress Notes (Signed)
Patient ID: Bethany Miller, female    DOB: 12-20-1945  Age: 73 y.o. MRN: [id number]    Subjective:  Subjective  HPI Bethany Miller presents for cough, wheezing and congestion x few weeks , no fever.  She is using otc with no relief.   Review of Systems  Constitutional: Positive for chills. Negative for fever.  HENT: Positive for congestion, postnasal drip, rhinorrhea and sinus pressure.   Respiratory: Positive for cough, chest tightness, shortness of breath and wheezing.   Cardiovascular: Negative for chest pain, palpitations and leg swelling.  Allergic/Immunologic: Negative for environmental allergies.    History Past Medical History:  Diagnosis Date  . Diabetes mellitus   . Hyperlipidemia   . Hypertension   . 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   Her family history includes Alcohol abuse in her father; Arthritis in her mother, sister, and unknown relative; Cancer in her brother and brother; Cancer (age of onset: 61) in her brother; Cancer (age of onset: 3) in her brother; Cirrhosis in her father; Heart disease in her brother and brother; Hyperlipidemia in her brother and brother; Hypertension in her brother and sister; Melanoma in her unknown relative; Throat cancer in her brother.She reports that she has never smoked. She has never used smokeless tobacco. She reports current alcohol use. She reports that she does not use drugs.  Current Outpatient Medications on File Prior to Visit  Medication Sig Dispense Refill  . acetaminophen (TYLENOL) 650 MG CR tablet Take 650 mg by mouth every 8 (eight) hours as needed for pain.    Marland Kitchen aspirin EC 81 MG tablet Take 81 mg by mouth daily.    Marland Kitchen atorvastatin (LIPITOR) 40 MG tablet TAKE 1 TABLET EVERY DAY 90 tablet 0  . citalopram (CELEXA) 40 MG tablet TAKE 1 TABLET (40 MG TOTAL) BY MOUTH  DAILY. 90 tablet 1  . docusate sodium (COLACE) 100 MG capsule Take 1 capsule (100 mg total) by mouth 2 (two) times daily as needed for mild constipation. 30 capsule 1  . glipiZIDE (GLUCOTROL) 10 MG tablet Take 1 tablet (10 mg total) by mouth 2 (two) times daily before a meal. 60 tablet 3  . glucose blood (ACCU-CHEK GUIDE) test strip USE 1 STRIP TO CHECK GLUCOSE ONCE DAILY 100 each 4  . ibuprofen (ADVIL,MOTRIN) 800 MG tablet Take 800 mg by mouth 3 (three) times daily as needed for moderate pain.    . Lancets (ACCU-CHEK MULTICLIX) lancets Use as instructed to test once a day DX E11.51 100 each 1  . lisinopril-hydrochlorothiazide (PRINZIDE,ZESTORETIC) 10-12.5 MG tablet TAKE 1 TABLET EVERY DAY 90 tablet 0  . Loratadine-Pseudoephedrine (CLARITIN-D 12 HOUR PO) Take 1 tablet by mouth daily as needed (sinus).     . Melatonin 3 MG TABS Take 3 mg by mouth at bedtime as needed (sleep).    . metFORMIN (GLUCOPHAGE-XR) 500 MG 24 hr tablet Take 2 tablets (1,000 mg total) by mouth 2 (two) times daily after a meal. 360 tablet 3  . Multiple Vitamins-Minerals (MULTIVITAMIN ADULT PO) Take 1 tablet by mouth daily.     Marland Kitchen omeprazole (PRILOSEC) 20 MG capsule TAKE 1 CAPSULE (20 MG TOTAL) BY MOUTH DAILY. 90 capsule 1  . Semaglutide,0.25 or 0.5MG /DOS, (OZEMPIC, 0.25 OR 0.5 MG/DOSE,) 2 MG/1.5ML SOPN Inject 0.5 mg into the skin once a week. 2 pen 5   No current facility-administered medications  on file prior to visit.      Objective:  Objective  Physical Exam Vitals signs and nursing note reviewed.  Constitutional:      Appearance: She is well-developed.  HENT:     Right Ear: External ear normal.     Left Ear: External ear normal.  Eyes:     General:        Right eye: No discharge.        Left eye: No discharge.     Conjunctiva/sclera: Conjunctivae normal.  Cardiovascular:     Rate and Rhythm: Normal rate and regular rhythm.     Heart sounds: Normal heart sounds. No murmur.  Pulmonary:     Effort: Pulmonary  effort is normal. No respiratory distress.     Breath sounds: Decreased air movement present. Examination of the right-lower field reveals rales. Examination of the left-lower field reveals rales. Decreased breath sounds and rales present. No wheezing.  Chest:     Chest wall: No tenderness.  Lymphadenopathy:     Cervical: No cervical adenopathy.  Neurological:     Mental Status: She is alert and oriented to person, place, and time.    BP 106/60 (BP Location: Right Arm, Cuff Size: Large)   Pulse 72   Temp 98.1 F (36.7 C) (Oral)   Resp 16   Ht 5' 1.75" (1.568 m)   Wt 206 lb 9.6 oz (93.7 kg)   SpO2 97%   BMI 38.09 kg/m  Wt Readings from Last 3 Encounters:  09/27/18 206 lb 9.6 oz (93.7 kg)  08/26/18 206 lb (93.4 kg)  03/31/18 199 lb (90.3 kg)     Lab Results  Component Value Date   WBC 9.6 04/21/2017   HGB 11.9 (L) 04/21/2017   HCT 36.1 04/21/2017   PLT 288 04/21/2017   GLUCOSE 173 (H) 06/10/2017   CHOL 157 05/20/2017   TRIG 141.0 05/20/2017   HDL 40.50 05/20/2017   LDLDIRECT 149.6 01/26/2013   LDLCALC 88 05/20/2017   ALT 12 06/10/2017   AST 13 06/10/2017   NA 136 06/10/2017   K 5.2 (H) 06/10/2017   CL 104 06/10/2017   CREATININE 1.11 06/10/2017   BUN 20 06/10/2017   CO2 26 06/10/2017   TSH 3.82 06/10/2017   INR 0.96 02/17/2017   HGBA1C 9.5 (A) 08/26/2018   MICROALBUR 0.6 03/28/2016    Dg Chest 2 View  Result Date: 09/27/2018 CLINICAL DATA:  Cough, congestion EXAM: CHEST - 2 VIEW COMPARISON:  CT chest 04/21/2017 FINDINGS: The heart size and mediastinal contours are within normal limits. Both lungs are clear. The visualized skeletal structures are unremarkable. IMPRESSION: No active cardiopulmonary disease. Electronically Signed   By: Kathreen Devoid   On: 09/27/2018 15:19     Assessment & Plan:  Plan  I am having Bethany Miller start on azithromycin. I am also having her maintain her Multiple Vitamins-Minerals (MULTIVITAMIN ADULT PO), Loratadine-Pseudoephedrine  (CLARITIN-D 12 HOUR PO), ibuprofen, Melatonin, acetaminophen, docusate sodium, aspirin EC, accu-chek multiclix, metFORMIN, lisinopril-hydrochlorothiazide, atorvastatin, glipiZIDE, Semaglutide(0.25 or 0.5MG /DOS), citalopram, omeprazole, glucose blood, and HYDROcodone-homatropine.  Meds ordered this encounter  Medications  . azithromycin (ZITHROMAX Z-PAK) 250 MG tablet    Sig: As directed    Dispense:  6 each    Refill:  0  . HYDROcodone-homatropine (HYCODAN) 5-1.5 MG/5ML syrup    Sig: Take 5 mLs by mouth every 8 (eight) hours as needed for cough.    Dispense:  120 mL    Refill:  0  Problem List Items Addressed This Visit      Unprioritized   Bronchitis - Primary   Relevant Medications   azithromycin (ZITHROMAX Z-PAK) 250 MG tablet   HYDROcodone-homatropine (HYCODAN) 5-1.5 MG/5ML syrup   Other Relevant Orders   DG Chest 2 View (Completed)   Sinusitis   Relevant Medications   azithromycin (ZITHROMAX Z-PAK) 250 MG tablet   HYDROcodone-homatropine (HYCODAN) 5-1.5 MG/5ML syrup      Follow-up: Return if symptoms worsen or fail to improve.  Ann Held, DO

## 2018-09-30 ENCOUNTER — Other Ambulatory Visit: Payer: Self-pay | Admitting: Internal Medicine

## 2018-09-30 ENCOUNTER — Encounter: Payer: Self-pay | Admitting: Internal Medicine

## 2018-09-30 MED ORDER — INSULIN GLARGINE 100 UNIT/ML SOLOSTAR PEN: 12.0000 [IU] | PEN_INJECTOR | Freq: Every day | SUBCUTANEOUS | 11 refills | Status: DC

## 2018-09-30 MED ORDER — INSULIN PEN NEEDLE 32G X 4 MM MISC: 3 refills | Status: DC

## 2018-10-01 ENCOUNTER — Encounter: Payer: Self-pay | Admitting: Internal Medicine

## 2018-10-05 ENCOUNTER — Encounter: Payer: Self-pay | Admitting: Internal Medicine

## 2018-10-05 ENCOUNTER — Other Ambulatory Visit: Payer: Self-pay | Admitting: Internal Medicine

## 2018-10-05 MED ORDER — BASAGLAR KWIKPEN 100 UNIT/ML ~~LOC~~ SOPN: 12.0000 [IU] | PEN_INJECTOR | Freq: Every day | SUBCUTANEOUS | 5 refills | Status: DC

## 2018-10-06 ENCOUNTER — Telehealth: Payer: Self-pay

## 2018-10-06 NOTE — Telephone Encounter (Signed)
Basaglar is not covered by insurance.  Would you like to try PA?

## 2018-10-06 NOTE — Telephone Encounter (Signed)
No, Lantus is covered but high price, I just thought I should try Basaglar. We can try NPH vials  - at [facility] 25-28$, but taken 2x a day - is she OK with this?

## 2018-10-07 ENCOUNTER — Other Ambulatory Visit: Payer: Self-pay

## 2018-10-07 MED ORDER — INSULIN ADMIN SUPPLIES MISC: 0 refills | Status: AC

## 2018-10-30 DIAGNOSIS — S62325A Displaced fracture of shaft of fourth metacarpal bone, left hand, initial encounter for closed fracture: Secondary | ICD-10-CM | POA: Diagnosis not present

## 2018-11-08 ENCOUNTER — Ambulatory Visit: Payer: Self-pay

## 2018-11-08 NOTE — Telephone Encounter (Signed)
rec'd call from pt.  Reported she has intermittent cough that has become more persistent the past few days.  Stated she feels the need to clear her throat, but hasn't been able to cough anything up.  Denied fever, shortness of breath, or any chest discomfort.  Reported clear nasal drainage, and post nasal drip.  Stated she feels a mild sore throat intermittently.  Reported she has been taking Claritin D for her runny nose and cough.  Reported she had bronchitis in March, and wanted to make Dr. Carollee Herter aware of her coughing.  Reported no travel in past 14 days.  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  Also, stated her son, that lives with her, works at an [employer], and that they practice safety guidelines at home.  Gave home care advice per protocol, and advised will send note for Dr. Etter Sjogren - Cheri Rous to review.  Encouraged to call back if any worsening of symptoms.  Verb. Understanding.        Reason for Disposition . Cough with cold symptoms (e.g., runny nose, postnasal drip, throat clearing)  Answer Assessment - Initial Assessment Questions 1. ONSET: "When did the cough begin?"      Has increased over last few days 2. SEVERITY: "How bad is the cough today?"      Mild to moderate  3. RESPIRATORY DISTRESS: "Describe your breathing."      Denied  4. FEVER: "Do you have a fever?" If so, ask: "What is your temperature, how was it measured, and when did it start?"     Denied  5. HEMOPTYSIS: "Are you coughing up any blood?" If so ask: "How much?" (flecks, streaks, tablespoons, etc.)     n/a 6. TREATMENT: "What have you done so far to treat the cough?" (e.g., meds, fluids, humidifier)     Using Claritin D only 7. CARDIAC HISTORY: "Do you have any history of heart disease?" (e.g., heart attack, congestive heart failure)      Denied  8. LUNG HISTORY: "Do you have any history of lung disease?"  (e.g., pulmonary  embolus, asthma, emphysema)    Denied  9. PE RISK FACTORS: "Do you have a history of blood clots?" (or: recent major surgery, recent prolonged travel, bedridden)     N/a  10. OTHER SYMPTOMS: "Do you have any other symptoms? (e.g., runny nose, wheezing, chest pain)       Clear nasal drainage, c/o post nasal drip, c/o mild intermittent sore throat; denied headache, body aches, shortness of breath; or loss of sense of taste/ smell.  11. PREGNANCY: "Is there any chance you are pregnant?" "When was your last menstrual period?"      n/a 12. TRAVEL: "Have you traveled out of the country in the last month?" (e.g., travel history, exposures)       Denied travel  Protocols used: [place]

## 2018-11-08 NOTE — Telephone Encounter (Signed)
We can do virtual visit

## 2018-11-08 NOTE — Telephone Encounter (Signed)
Called patient to schedule appointment, states she would like to hold off for right now she feels better.

## 2018-11-10 DIAGNOSIS — M899 Disorder of bone, unspecified: Secondary | ICD-10-CM | POA: Diagnosis not present

## 2018-11-10 DIAGNOSIS — M79642 Pain in left hand: Secondary | ICD-10-CM | POA: Diagnosis not present

## 2018-11-10 DIAGNOSIS — S62355A Nondisplaced fracture of shaft of fourth metacarpal bone, left hand, initial encounter for closed fracture: Secondary | ICD-10-CM | POA: Diagnosis not present

## 2018-11-11 ENCOUNTER — Encounter: Payer: Self-pay | Admitting: Family Medicine

## 2018-11-11 ENCOUNTER — Ambulatory Visit (INDEPENDENT_AMBULATORY_CARE_PROVIDER_SITE_OTHER): Payer: Medicare Other | Admitting: Family Medicine

## 2018-11-11 ENCOUNTER — Other Ambulatory Visit: Payer: Self-pay

## 2018-11-11 DIAGNOSIS — J0140 Acute pansinusitis, unspecified: Secondary | ICD-10-CM

## 2018-11-11 MED ORDER — AMOXICILLIN-POT CLAVULANATE 875-125 MG PO TABS: 1.0000 | ORAL_TABLET | Freq: Two times a day (BID) | ORAL | 0 refills | Status: DC

## 2018-11-11 NOTE — Progress Notes (Signed)
Virtual Visit via Video Note  I connected with Bethany Miller on 11/11/18 at 11:00 AM EDT by a video enabled telemedicine application and verified that I am speaking with the correct person using two identifiers.  Location: Patient: home  Provider: home    I discussed the limitations of evaluation and management by telemedicine and the availability of in person appointments. The patient expressed understanding and agreed to proceed.  History of Present Illness: Pt is home c/o sinus pressure and chills.  No fever  + prod cough. + yellow mucus ,  She is taking delsym and mucinex and claritin d  Symptoms x 1 week  No sob, no wheezing, no chest pain   Past Medical History:  Diagnosis Date  . Diabetes mellitus   . Hyperlipidemia   . Hypertension   . Murmur    Outpatient Encounter Medications as of 11/11/2018  Medication Sig  . acetaminophen (TYLENOL) 650 MG CR tablet Take 650 mg by mouth every 8 (eight) hours as needed for pain.  Marland Kitchen amoxicillin-clavulanate (AUGMENTIN) 875-125 MG tablet Take 1 tablet by mouth 2 (two) times daily.  Marland Kitchen aspirin EC 81 MG tablet Take 81 mg by mouth daily.  Marland Kitchen atorvastatin (LIPITOR) 40 MG tablet TAKE 1 TABLET EVERY DAY  . azithromycin (ZITHROMAX Z-PAK) 250 MG tablet As directed  . citalopram (CELEXA) 40 MG tablet TAKE 1 TABLET (40 MG TOTAL) BY MOUTH DAILY.  Marland Kitchen docusate sodium (COLACE) 100 MG capsule Take 1 capsule (100 mg total) by mouth 2 (two) times daily as needed for mild constipation.  Marland Kitchen glipiZIDE (GLUCOTROL) 10 MG tablet Take 1 tablet (10 mg total) by mouth 2 (two) times daily before a meal.  . glucose blood (ACCU-CHEK GUIDE) test strip USE 1 STRIP TO CHECK GLUCOSE ONCE DAILY  . HYDROcodone-homatropine (HYCODAN) 5-1.5 MG/5ML syrup Take 5 mLs by mouth every 8 (eight) hours as needed for cough.  Marland Kitchen ibuprofen (ADVIL,MOTRIN) 800 MG tablet Take 800 mg by mouth 3 (three) times daily as needed for moderate pain.  . Insulin Admin Supplies MISC Use to inject insulin 2  times a day.  . Insulin Glargine (LANTUS SOLOSTAR) 100 UNIT/ML Solostar Pen Inject 12 Units into the skin at bedtime.  . Insulin Pen Needle 32G X 4 MM MISC Use 1x a day  . Lancets (ACCU-CHEK MULTICLIX) lancets Use as instructed to test once a day DX E11.51  . lisinopril-hydrochlorothiazide (PRINZIDE,ZESTORETIC) 10-12.5 MG tablet TAKE 1 TABLET EVERY DAY  . Loratadine-Pseudoephedrine (CLARITIN-D 12 HOUR PO) Take 1 tablet by mouth daily as needed (sinus).   . Melatonin 3 MG TABS Take 3 mg by mouth at bedtime as needed (sleep).  . metFORMIN (GLUCOPHAGE-XR) 500 MG 24 hr tablet Take 2 tablets (1,000 mg total) by mouth 2 (two) times daily after a meal.  . Multiple Vitamins-Minerals (MULTIVITAMIN ADULT PO) Take 1 tablet by mouth daily.   Marland Kitchen omeprazole (PRILOSEC) 20 MG capsule TAKE 1 CAPSULE (20 MG TOTAL) BY MOUTH DAILY.   No facility-administered encounter medications on file as of 11/11/2018.      Observations/Objective: 98.6   No other vitals obtained  + frontal and max sinus tenderness  Pt not sob pt in NAD  Assessment and Plan: 1. Acute non-recurrent pansinusitis abx per orders  Start flonase con't delsym for cough prn ---- you also have some left over cough syrup from March you can use  - amoxicillin-clavulanate (AUGMENTIN) 875-125 MG tablet; Take 1 tablet by mouth 2 (two) times daily.  Dispense: 20 tablet;  Refill: 0 Call prn   Follow Up Instructions:    I discussed the assessment and treatment plan with the patient. The patient was provided an opportunity to ask questions and all were answered. The patient agreed with the plan and demonstrated an understanding of the instructions.   The patient was advised to call back or seek an in-person evaluation if the symptoms worsen or if the condition fails to improve as anticipated.  I provided 25 minutes of non-face-to-face time during this encounter.   Ann Held, DO

## 2018-11-25 ENCOUNTER — Emergency Department (HOSPITAL_BASED_OUTPATIENT_CLINIC_OR_DEPARTMENT_OTHER)
Admission: EM | Admit: 2018-11-25 | Discharge: 2018-11-25 | Disposition: A | Discharge status: Home / Self Care | Payer: Medicare Other | Attending: Emergency Medicine | Admitting: Emergency Medicine

## 2018-11-25 ENCOUNTER — Encounter (HOSPITAL_BASED_OUTPATIENT_CLINIC_OR_DEPARTMENT_OTHER): Payer: Self-pay | Admitting: *Deleted

## 2018-11-25 ENCOUNTER — Telehealth: Payer: Self-pay | Admitting: Internal Medicine

## 2018-11-25 ENCOUNTER — Other Ambulatory Visit: Payer: Self-pay

## 2018-11-25 DIAGNOSIS — N179 Acute kidney failure, unspecified: Secondary | ICD-10-CM | POA: Insufficient documentation

## 2018-11-25 DIAGNOSIS — T887XXA Unspecified adverse effect of drug or medicament, initial encounter: Secondary | ICD-10-CM | POA: Diagnosis not present

## 2018-11-25 DIAGNOSIS — T383X5A Adverse effect of insulin and oral hypoglycemic [antidiabetic] drugs, initial encounter: Secondary | ICD-10-CM | POA: Diagnosis not present

## 2018-11-25 DIAGNOSIS — E119 Type 2 diabetes mellitus without complications: Secondary | ICD-10-CM | POA: Diagnosis not present

## 2018-11-25 DIAGNOSIS — I10 Essential (primary) hypertension: Secondary | ICD-10-CM | POA: Insufficient documentation

## 2018-11-25 DIAGNOSIS — R112 Nausea with vomiting, unspecified: Secondary | ICD-10-CM | POA: Insufficient documentation

## 2018-11-25 DIAGNOSIS — Z96651 Presence of right artificial knee joint: Secondary | ICD-10-CM | POA: Insufficient documentation

## 2018-11-25 DIAGNOSIS — Y69 Unspecified misadventure during surgical and medical care: Secondary | ICD-10-CM | POA: Diagnosis not present

## 2018-11-25 DIAGNOSIS — R197 Diarrhea, unspecified: Secondary | ICD-10-CM | POA: Insufficient documentation

## 2018-11-25 LAB — CBC
HCT: 41.9 % (ref 36.0–46.0)
Hemoglobin: 13.8 g/dL (ref 12.0–15.0)
MCH: 29.4 pg (ref 26.0–34.0)
MCHC: 32.9 g/dL (ref 30.0–36.0)
MCV: 89.1 fL (ref 80.0–100.0)
Platelets: 360 10*3/uL (ref 150–400)
RBC: 4.7 MIL/uL (ref 3.87–5.11)
RDW: 12.5 % (ref 11.5–15.5)
WBC: 10.3 10*3/uL (ref 4.0–10.5)
nRBC: 0 % (ref 0.0–0.2)

## 2018-11-25 LAB — LIPASE, BLOOD: Lipase: 24 U/L (ref 11–51)

## 2018-11-25 LAB — COMPREHENSIVE METABOLIC PANEL
ALT: 15 U/L (ref 0–44)
AST: 14 U/L — ABNORMAL LOW (ref 15–41)
Albumin: 4.2 g/dL (ref 3.5–5.0)
Alkaline Phosphatase: 99 U/L (ref 38–126)
Anion gap: 13 (ref 5–15)
BUN: 39 mg/dL — ABNORMAL HIGH (ref 8–23)
CO2: 17 mmol/L — ABNORMAL LOW (ref 22–32)
Calcium: 9 mg/dL (ref 8.9–10.3)
Chloride: 103 mmol/L (ref 98–111)
Creatinine, Ser: 1.76 mg/dL — ABNORMAL HIGH (ref 0.44–1.00)
GFR calc Af Amer: 33 mL/min — ABNORMAL LOW (ref >60)
GFR calc non Af Amer: 29 mL/min — ABNORMAL LOW (ref >60)
Glucose, Bld: 237 mg/dL — ABNORMAL HIGH (ref 70–99)
Potassium: 4.1 mmol/L (ref 3.5–5.1)
Sodium: 133 mmol/L — ABNORMAL LOW (ref 135–145)
Total Bilirubin: 1 mg/dL (ref 0.3–1.2)
Total Protein: 7.1 g/dL (ref 6.5–8.1)

## 2018-11-25 LAB — CBG MONITORING, ED: Glucose-Capillary: 264 mg/dL — ABNORMAL HIGH (ref 70–99)

## 2018-11-25 MED ORDER — ONDANSETRON 4 MG PO TBDP: 4.0000 mg | ORAL_TABLET | Freq: Three times a day (TID) | ORAL | 0 refills | Status: DC | PRN

## 2018-11-25 MED ORDER — ONDANSETRON HCL 4 MG/2ML IJ SOLN
4.0000 mg | Freq: Once | INTRAMUSCULAR | Status: AC
  Administered 2018-11-25: 18:00:00 4 mg via INTRAVENOUS
  Filled 2018-11-25: qty 2

## 2018-11-25 MED ORDER — SODIUM CHLORIDE 0.9 % IV BOLUS
1000.0000 mL | Freq: Once | INTRAVENOUS | Status: AC
  Administered 2018-11-25: 1000 mL via INTRAVENOUS

## 2018-11-25 MED ORDER — SODIUM CHLORIDE 0.9 % IV BOLUS
1000.0000 mL | Freq: Once | INTRAVENOUS | Status: AC
  Administered 2018-11-25: 18:00:00 1000 mL via INTRAVENOUS

## 2018-11-25 NOTE — Discharge Instructions (Addendum)
You were evaluated in the Emergency Department and after careful evaluation, we did not find any emergent condition requiring admission or further testing in the hospital.  Your symptoms today seem to be due to a side effect of your new medication.  Your nausea and vomiting has caused dehydration and a mild injury to your kidneys.  We provided you with hydration here in the emergency department and we feel that you are safe to go home so long as you have your kidney function retested within the next 3 to 5 days.  Use the nausea medicine provided as needed to help you stay hydrated at home.  Please return to the Emergency Department if you experience any worsening of your condition.  We encourage you to follow up with a primary care provider.  Thank you for allowing [place] to be a part of your care.

## 2018-11-25 NOTE — Telephone Encounter (Signed)
Patient called requesting to be called at ph# [phone]-re: since starting Ozempic on Sunday night-severe vomiting-has had some diahrea-lost 5-6 lbs in a few days.

## 2018-11-25 NOTE — ED Triage Notes (Signed)
Vomiting and diarrhea x 3 days. No pain.

## 2018-11-25 NOTE — ED Notes (Signed)
Pt reports marked improvement in nausea.

## 2018-11-25 NOTE — ED Provider Notes (Signed)
[facility] Emergency Department Provider Note MRN:  [id number]  Arrival date & time: 11/25/18     Chief Complaint   Emesis   History of Present Illness   Bethany Miller is a 73 y.o. year-old female with a history of diabetes, hypertension presenting to the ED with chief complaint of emesis.  Patient explains that she took her first injection of a new diabetes medicine called Ozempic Sunday evening.  This is a weekly subcutaneous injection.  She woke up Monday morning with nausea and nonbloody nonbilious emesis.  Symptoms were mild on Monday, became more severe Tuesday and Wednesday, reportedly vomiting every hour, symptoms continued today.  Denies blood in the vomit, endorsing mild diarrhea, no blood in stool, denies abdominal pain, no chest pain or shortness of breath, no fever.  Review of Systems  A complete 10 system review of systems was obtained and all systems are negative except as noted in the HPI and PMH.   Patient's Health History    Past Medical History:  Diagnosis Date  . Diabetes mellitus   . Hyperlipidemia   . Hypertension   . Murmur     Past Surgical History:  Procedure Laterality Date  . ABDOMINAL HYSTERECTOMY    . BACK SURGERY    . CHOLECYSTECTOMY    . TONSILLECTOMY    . TOTAL KNEE ARTHROPLASTY    . TOTAL KNEE ARTHROPLASTY Right 02/26/2017   Procedure: RIGHT TOTAL KNEE ARTHROPLASTY;  Surgeon: Susa Day, MD;  Location: [facility] ORS;  Service: Orthopedics;  Laterality: Right;    Family History  Problem Relation Age of Onset  . Throat cancer Brother   . Heart disease Brother        MI  . Cancer Brother 80       throat,   . Hyperlipidemia Brother   . Cirrhosis Father   . Alcohol abuse Father   . Heart disease Brother   . Cancer Brother   . Arthritis Mother   . Hypertension Sister   . Arthritis Sister   . Cancer Brother   . Cancer Brother 55       melanoma  . Hypertension Brother   . Hyperlipidemia Brother   . Melanoma  Other   . Arthritis Other     Social History   Socioeconomic History  . Marital status: Widowed    Spouse name: Not on file  . Number of children: Not on file  . Years of education: Not on file  . Highest education level: Not on file  Occupational History  . Not on file  Social Needs  . Financial resource strain: Not on file  . Food insecurity:    Worry: Not on file    Inability: Not on file  . Transportation needs:    Medical: Not on file    Non-medical: Not on file  Tobacco Use  . Smoking status: Never Smoker  . Smokeless tobacco: Never Used  Substance and Sexual Activity  . Alcohol use: Yes    Comment: rare  . Drug use: No  . Sexual activity: Not Currently    Partners: Male  Lifestyle  . Physical activity:    Days per week: Not on file    Minutes per session: Not on file  . Stress: Not on file  Relationships  . Social connections:    Talks on phone: Not on file    Gets together: Not on file    Attends religious service: Not on file  Active member of club or organization: Not on file    Attends meetings of clubs or organizations: Not on file    Relationship status: Not on file  . Intimate partner violence:    Fear of current or ex partner: Not on file    Emotionally abused: Not on file    Physically abused: Not on file    Forced sexual activity: Not on file  Other Topics Concern  . 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   Caffeine use: coffee in the am     Physical Exam  Vital Signs and Nursing Notes reviewed Vitals:   11/25/18 1720 11/25/18 1722  BP:  102/61  Pulse: 90   Resp: 20   Temp: 98.1 F (36.7 C)   SpO2: 95%     CONSTITUTIONAL: Well-appearing, NAD NEURO:  Alert and oriented x 3, no focal deficits EYES:  eyes equal and reactive ENT/NECK:  no LAD, no JVD CARDIO: Regular rate, well-perfused, normal S1 and S2 PULM:  CTAB no wheezing or rhonchi GI/GU:  normal bowel sounds, non-distended,  non-tender MSK/SPINE:  No gross deformities, no edema SKIN:  no rash, atraumatic PSYCH:  Appropriate speech and behavior  Diagnostic and Interventional Summary    EKG Interpretation  Date/Time:  Thursday Nov 25 2018 18:01:55 EDT Ventricular Rate:  65 PR Interval:    QRS Duration: 104 QT Interval:  419 QTC Calculation: 436 R Axis:   55 Text Interpretation:  Sinus rhythm nonspecific conduction delay Confirmed by Gerlene Fee [id number]) on 11/25/2018 7:04:34 PM      Labs Reviewed  COMPREHENSIVE METABOLIC PANEL - Abnormal; Notable for the following components:      Result Value   Sodium 133 (*)    CO2 17 (*)    Glucose, Bld 237 (*)    BUN 39 (*)    Creatinine, Ser 1.76 (*)    AST 14 (*)    GFR calc non Af Amer 29 (*)    GFR calc Af Amer 33 (*)    All other components within normal limits  CBG MONITORING, ED - Abnormal; Notable for the following components:   Glucose-Capillary 264 (*)    All other components within normal limits  LIPASE, BLOOD  CBC  URINALYSIS, ROUTINE W REFLEX MICROSCOPIC    No orders to display    Medications  sodium chloride 0.9 % bolus 1,000 mL (0 mLs Intravenous Stopped 11/25/18 1916)  ondansetron (ZOFRAN) injection 4 mg (4 mg Intravenous Given 11/25/18 1754)  sodium chloride 0.9 % bolus 1,000 mL (1,000 mLs Intravenous New Bag/Given 11/25/18 1917)     Procedures Critical Care  ED Course and Medical Decision Making  I have reviewed the triage vital signs and the nursing notes.  Pertinent labs & imaging results that were available during my care of the patient were reviewed by me and considered in my medical decision making (see below for details).  History consistent with medication side effect, upon review of the literature Ozempic does cause GI disturbance fairly frequently.  Will check labs to evaluate for electrolyte disturbance, AKI.  Patient has a soft and nontender abdomen and there is no indication for imaging at this time.  Patient is  feeling much better.  Labs revealed mild acidosis and AKI that seems well explained by medication side effect.  Patient responded well to Zofran here in the emergency department.  She is safe for discharge with strict return precautions, and patient has  agreed to have her kidney function rechecked early next week.  After the discussed management above, the patient was determined to be safe for discharge.  The patient was in agreement with this plan and all questions regarding their care were answered.  ED return precautions were discussed and the patient will return to the ED with any significant worsening of condition.  Barth Kirks. Sedonia Small, MD [place] [email or web address]  Final Clinical Impressions(s) / ED Diagnoses     ICD-10-CM   1. Nausea and vomiting, intractability of vomiting not specified, unspecified vomiting type R11.2   2. Medication side effect T88.7XXA   3. Acute kidney injury [facility]) N17.9     ED Discharge Orders         Ordered    ondansetron (ZOFRAN ODT) 4 MG disintegrating tablet  Every 8 hours PRN     11/25/18 2018             Maudie Flakes, MD 11/25/18 2020

## 2018-11-25 NOTE — Telephone Encounter (Signed)
When did she start Ozempic?.  Per chart, she started Basaglar.  This should not give her vomiting and diarrhea.Marland KitchenMarland Kitchen

## 2018-11-26 ENCOUNTER — Encounter: Payer: Self-pay | Admitting: Family Medicine

## 2018-11-26 DIAGNOSIS — S62355D Nondisplaced fracture of shaft of fourth metacarpal bone, left hand, subsequent encounter for fracture with routine healing: Secondary | ICD-10-CM | POA: Diagnosis not present

## 2018-11-26 DIAGNOSIS — S62305D Unspecified fracture of fourth metacarpal bone, left hand, subsequent encounter for fracture with routine healing: Secondary | ICD-10-CM | POA: Diagnosis not present

## 2018-11-26 DIAGNOSIS — R52 Pain, unspecified: Secondary | ICD-10-CM | POA: Diagnosis not present

## 2018-11-26 NOTE — Telephone Encounter (Signed)
Patient went to ER.  Will call to check on her and ask about the medication.

## 2018-11-29 ENCOUNTER — Other Ambulatory Visit: Payer: Self-pay

## 2018-11-29 ENCOUNTER — Encounter: Payer: Self-pay | Admitting: Family Medicine

## 2018-11-29 ENCOUNTER — Ambulatory Visit (INDEPENDENT_AMBULATORY_CARE_PROVIDER_SITE_OTHER): Payer: Medicare Other | Admitting: Family Medicine

## 2018-11-29 ENCOUNTER — Telehealth: Payer: Self-pay | Admitting: Family Medicine

## 2018-11-29 DIAGNOSIS — E785 Hyperlipidemia, unspecified: Secondary | ICD-10-CM

## 2018-11-29 DIAGNOSIS — E1165 Type 2 diabetes mellitus with hyperglycemia: Secondary | ICD-10-CM | POA: Diagnosis not present

## 2018-11-29 DIAGNOSIS — E1169 Type 2 diabetes mellitus with other specified complication: Secondary | ICD-10-CM

## 2018-11-29 DIAGNOSIS — R112 Nausea with vomiting, unspecified: Secondary | ICD-10-CM

## 2018-11-29 NOTE — Telephone Encounter (Signed)
LVM for pt to schedule CPE in 3 months per provider.

## 2018-11-29 NOTE — Progress Notes (Signed)
Virtual Visit via Video Note  I connected with Bethany Miller on 11/29/18 at  8:30 AM EDT by a video enabled telemedicine application and verified that I am speaking with the correct person using two identifiers.  Location: Patient: home Provider: home   I discussed the limitations of evaluation and management by telemedicine and the availability of in person appointments. The patient expressed understanding and agreed to proceed.  History of Present Illness: Pt is home resting.  Feeling better since being in the er.  She took 1 dose of ozempic and became nauseous and starting vomiting.  She has stopped vomiting but has not really eaten anything.  She is drinking water and eating sugar free ice pops.   Er notes / labs reviewed.     Past Medical History:  Diagnosis Date  . Diabetes mellitus   . Hyperlipidemia   . Hypertension   . Murmur    Current Outpatient Medications on File Prior to Visit  Medication Sig Dispense Refill  . acetaminophen (TYLENOL) 650 MG CR tablet Take 650 mg by mouth every 8 (eight) hours as needed for pain.    Marland Kitchen aspirin EC 81 MG tablet Take 81 mg by mouth daily.    Marland Kitchen atorvastatin (LIPITOR) 40 MG tablet TAKE 1 TABLET EVERY DAY 90 tablet 0  . citalopram (CELEXA) 40 MG tablet TAKE 1 TABLET (40 MG TOTAL) BY MOUTH DAILY. 90 tablet 1  . docusate sodium (COLACE) 100 MG capsule Take 1 capsule (100 mg total) by mouth 2 (two) times daily as needed for mild constipation. 30 capsule 1  . glipiZIDE (GLUCOTROL) 10 MG tablet Take 1 tablet (10 mg total) by mouth 2 (two) times daily before a meal. 60 tablet 3  . glucose blood (ACCU-CHEK GUIDE) test strip USE 1 STRIP TO CHECK GLUCOSE ONCE DAILY 100 each 4  . HYDROcodone-homatropine (HYCODAN) 5-1.5 MG/5ML syrup Take 5 mLs by mouth every 8 (eight) hours as needed for cough. 120 mL 0  . ibuprofen (ADVIL,MOTRIN) 800 MG tablet Take 800 mg by mouth 3 (three) times daily as needed for moderate pain.    . Insulin Admin Supplies MISC Use to  inject insulin 2 times a day. 180 each 0  . Insulin Glargine (LANTUS SOLOSTAR) 100 UNIT/ML Solostar Pen Inject 12 Units into the skin at bedtime. 5 pen 11  . Insulin Pen Needle 32G X 4 MM MISC Use 1x a day 100 each 3  . Lancets (ACCU-CHEK MULTICLIX) lancets Use as instructed to test once a day DX E11.51 100 each 1  . lisinopril-hydrochlorothiazide (PRINZIDE,ZESTORETIC) 10-12.5 MG tablet TAKE 1 TABLET EVERY DAY 90 tablet 0  . Loratadine-Pseudoephedrine (CLARITIN-D 12 HOUR PO) Take 1 tablet by mouth daily as needed (sinus).     . Melatonin 3 MG TABS Take 3 mg by mouth at bedtime as needed (sleep).    . metFORMIN (GLUCOPHAGE-XR) 500 MG 24 hr tablet Take 2 tablets (1,000 mg total) by mouth 2 (two) times daily after a meal. 360 tablet 3  . Multiple Vitamins-Minerals (MULTIVITAMIN ADULT PO) Take 1 tablet by mouth daily.     Marland Kitchen omeprazole (PRILOSEC) 20 MG capsule TAKE 1 CAPSULE (20 MG TOTAL) BY MOUTH DAILY. 90 capsule 1  . ondansetron (ZOFRAN ODT) 4 MG disintegrating tablet Take 1 tablet (4 mg total) by mouth every 8 (eight) hours as needed for nausea or vomiting. 20 tablet 0   No current facility-administered medications on file prior to visit.     Observations/Objective: Wt 202 lb  bs 141    Pt in NAD rr normal    Assessment and Plan: 1. Nausea and vomiting, intractability of vomiting not specified, unspecified vomiting type pedialyte ice pops -- no more than 2-3 days Crackers Sips liquids slowly zofran prn Call if any problems  - Comprehensive metabolic panel; Future  2. Uncontrolled type 2 diabetes mellitus with hyperglycemia ([place]) hgba1c to be checked, minimize simple carbs. Increase exercise as tolerated. Continue current meds F/u endo   - Lipid panel; Future - Hemoglobin A1c; Future  3. Hyperlipidemia associated with type 2 diabetes mellitus ([place]) Tolerating statin, encouraged heart healthy diet, avoid trans fats, minimize simple carbs and saturated fats. Increase exercise as  tolerated - Lipid panel; Future   Follow Up Instructions:    I discussed the assessment and treatment plan with the patient. The patient was provided an opportunity to ask questions and all were answered. The patient agreed with the plan and demonstrated an understanding of the instructions.   The patient was advised to call back or seek an in-person evaluation if the symptoms worsen or if the condition fails to improve as anticipated.  I provided 25 minutes of non-face-to-face time during this encounter.   Ann Held, DO

## 2018-11-30 ENCOUNTER — Other Ambulatory Visit (INDEPENDENT_AMBULATORY_CARE_PROVIDER_SITE_OTHER): Payer: Medicare Other

## 2018-11-30 ENCOUNTER — Other Ambulatory Visit: Payer: Self-pay | Admitting: Family Medicine

## 2018-11-30 ENCOUNTER — Other Ambulatory Visit: Payer: Self-pay

## 2018-11-30 DIAGNOSIS — R112 Nausea with vomiting, unspecified: Secondary | ICD-10-CM

## 2018-11-30 DIAGNOSIS — E1165 Type 2 diabetes mellitus with hyperglycemia: Secondary | ICD-10-CM | POA: Diagnosis not present

## 2018-11-30 DIAGNOSIS — E1169 Type 2 diabetes mellitus with other specified complication: Secondary | ICD-10-CM | POA: Diagnosis not present

## 2018-11-30 DIAGNOSIS — E785 Hyperlipidemia, unspecified: Secondary | ICD-10-CM

## 2018-11-30 LAB — HEMOGLOBIN A1C: Hgb A1c MFr Bld: 10.4 % — ABNORMAL HIGH (ref 4.6–6.5)

## 2018-11-30 LAB — LIPID PANEL
Cholesterol: 145 mg/dL (ref 0–200)
HDL: 34.2 mg/dL — ABNORMAL LOW (ref >39.00)
LDL Cholesterol: 76 mg/dL (ref 0–99)
NonHDL: 111.05
Total CHOL/HDL Ratio: 4
Triglycerides: 175 mg/dL — ABNORMAL HIGH (ref 0.0–149.0)
VLDL: 35 mg/dL (ref 0.0–40.0)

## 2018-11-30 LAB — COMPREHENSIVE METABOLIC PANEL
ALT: 12 U/L (ref 0–35)
AST: 11 U/L (ref 0–37)
Albumin: 4 g/dL (ref 3.5–5.2)
Alkaline Phosphatase: 104 U/L (ref 39–117)
BUN: 16 mg/dL (ref 6–23)
CO2: 27 mEq/L (ref 19–32)
Calcium: 9.2 mg/dL (ref 8.4–10.5)
Chloride: 104 mEq/L (ref 96–112)
Creatinine, Ser: 1.01 mg/dL (ref 0.40–1.20)
GFR: 53.99 mL/min — ABNORMAL LOW (ref >60.00)
Glucose, Bld: 180 mg/dL — ABNORMAL HIGH (ref 70–99)
Potassium: 4.6 mEq/L (ref 3.5–5.1)
Sodium: 137 mEq/L (ref 135–145)
Total Bilirubin: 0.6 mg/dL (ref 0.2–1.2)
Total Protein: 6.6 g/dL (ref 6.0–8.3)

## 2018-11-30 NOTE — Telephone Encounter (Signed)
Oh, good news! I will d/w her on Thu about the insulins, then. Ty!

## 2018-11-30 NOTE — Telephone Encounter (Signed)
Patient is feeling much better.  Patient states we tried Vania Rea but was to expensive.  Lantus to expensive  Ozempic to expensive.  Patient had tried for a tier reduction on the Ozempic and did not hear back from the pharmacy so she decided to take her stimulus money and pay for the Ozempic. When she got to the pharmacy she found out insurance did approve a tier reduction and the cost was only 40.00 so she filled it.  Patient does not recall anything about Basaglar.  Patient has follow up this Thursday with [place].

## 2018-12-02 ENCOUNTER — Encounter: Payer: Self-pay | Admitting: Internal Medicine

## 2018-12-02 ENCOUNTER — Other Ambulatory Visit: Payer: Self-pay

## 2018-12-02 ENCOUNTER — Ambulatory Visit (INDEPENDENT_AMBULATORY_CARE_PROVIDER_SITE_OTHER): Payer: Medicare Other | Admitting: Internal Medicine

## 2018-12-02 DIAGNOSIS — E785 Hyperlipidemia, unspecified: Secondary | ICD-10-CM

## 2018-12-02 DIAGNOSIS — E1165 Type 2 diabetes mellitus with hyperglycemia: Secondary | ICD-10-CM

## 2018-12-02 DIAGNOSIS — IMO0002 Reserved for concepts with insufficient information to code with codable children: Secondary | ICD-10-CM

## 2018-12-02 DIAGNOSIS — E1151 Type 2 diabetes mellitus with diabetic peripheral angiopathy without gangrene: Secondary | ICD-10-CM

## 2018-12-02 DIAGNOSIS — E669 Obesity, unspecified: Secondary | ICD-10-CM | POA: Diagnosis not present

## 2018-12-02 NOTE — Progress Notes (Signed)
Patient ID: Bethany Miller, female   DOB: 1946/04/05, 73 y.o.   MRN: [id number]  Patient location: Home My location: Office  Referring Provider: Ann Held, DO  I connected with the patient on 12/02/18 at  3:01 PM EDT by a video enabled telemedicine application and verified that I am speaking with the correct person.   I discussed the limitations of evaluation and management by telemedicine and the availability of in person appointments. The patient expressed understanding and agreed to proceed.   Details of the encounter are shown below.  HPI: Bethany Miller is a 73 y.o.-year-old female, presenting for f/u for DM2, dx 2010, non-insulin-dependent, uncontrolled, with complications (CKD). Last visit 3 months ago. She has [employer] part D for meds, [employer], and [employer] for the rest.   At last visit I suggested to start Ozempic, but she developed nausea and vomiting and was in the emergency room on 11/25/2018.  She also had a hand bone fx and bronchitis.   Last hemoglobin A1c was: Lab Results  Component Value Date   HGBA1C 10.4 (H) 11/30/2018   HGBA1C 9.5 (A) 08/26/2018   HGBA1C 9.8 (A) 03/31/2018    Pt is on a regimen of: - Metformin ER 1000 mg 2x a day (she tried 2000 mg with dinner with no changes sugars) - Glipizide 5 mg 2x a day before breakfast and dinner We again tried Jardiance  - added again 04/2018 >> not affordable Stopped  Glipizide 5 mg 2x a day 06/2017 b/c lows. Tradjenta and Alogliptin were not covered She was previously on Invokana, Jardiance 10 mg daily >> too expensive. Tried Cycloset >> too expensive Tried Januvia >>  too expensive.  Tried Metformin 500 mg po >> severe diarrhea. She was on Glimepiride >> hypoglycemia in the 60's on 2 mg daily. She had rapid acting insulin with steroid shots before.   Pt checks her sugars twice a day: - am: 130-140 >> 189-200s >> 85x1, 120-285, 300 >> 141, 200s - 2h after b'fast: 97, 164, 173 >> n/c - Lunch:   low 100s >> n/c >> 163 >> n/c - 2h after lunch: 160 >> n/c - Dinnertime:  83, 103 >> 57, 143-167 >> n/c - 2h after dinner: up to 200 >> 200s >> 128-250 >> 99, 200 Lowest sugar was 56 x1 >> .Marland Kitchen.189 >> 85 >> 99;  she has hypoglycemia awareness in the 90s. Highest sugar was 281 >> 321 >> 300 >> 300.  ReliOn meter.  -+ CKD, last BUN/creatinine:  Lab Results  Component Value Date   BUN 16 11/30/2018   CREATININE 1.01 11/30/2018   No MAU: Lab Results  Component Value Date   MICRALBCREAT 4 03/28/2016   MICRALBCREAT 0.6 11/16/2015   MICRALBCREAT 0.6 01/26/2013   MICRALBCREAT 0.3 03/24/2012   MICRALBCREAT 0.4 03/18/2011   MICRALBCREAT 11.4 10/29/2009   MICRALBCREAT 3.4 04/09/2009   MICRALBCREAT 17.1 01/25/2009   GFR: Lab Results  Component Value Date   GFRNONAA 29 (L) 11/25/2018   GFRNONAA 45 (L) 04/21/2017   GFRNONAA 54 (L) 02/27/2017   GFRNONAA 35 (L) 02/17/2017   GFRNONAA 52.79 (L) 06/19/2010   GFRNONAA 59.68 10/29/2009   GFRNONAA 53.52 07/03/2009   GFRNONAA 59.78 04/09/2009   GFRNONAA 59.82 01/25/2009   GFRNONAA 78 05/08/2008   -+ HL; last set of lipids: Lab Results  Component Value Date   CHOL 145 11/30/2018   HDL 34.20 (L) 11/30/2018   LDLCALC 76 11/30/2018   LDLDIRECT 149.6 01/26/2013  TRIG 175.0 (H) 11/30/2018   CHOLHDL 4 11/30/2018  On Lipitor. - last eye exam was in 2019: No DR -Now numbness and tingling in her feet.  She sees podiatry for ingrown toenails.  She also has a history of HTN. 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  ROS: Constitutional: no weight gain/+ weight loss, no fatigue, no subjective hyperthermia, no subjective hypothermia Eyes: no blurry vision, no xerophthalmia ENT: no sore throat, no nodules palpated in neck, no dysphagia, no odynophagia, no hoarseness Cardiovascular: no CP/no SOB/no palpitations/no leg swelling Respiratory: no cough/no SOB/no wheezing Gastrointestinal: + N/+ V/no D/no C/no acid reflux Musculoskeletal: no muscle aches/no joint  aches Skin: no rashes, no hair loss Neurological: no tremors/no numbness/no tingling/no dizziness  I reviewed pt's medications, allergies, PMH, social hx, family hx, and changes were documented in the history of present illness. Otherwise, unchanged from my initial visit note.  Past Medical History:  Diagnosis Date  . Diabetes mellitus   . Hyperlipidemia   . Hypertension   . Murmur    Past Surgical History:  Procedure Laterality Date  . ABDOMINAL HYSTERECTOMY    . BACK SURGERY    . CHOLECYSTECTOMY    . TONSILLECTOMY    . TOTAL KNEE ARTHROPLASTY    . TOTAL KNEE ARTHROPLASTY Right 02/26/2017   Procedure: RIGHT TOTAL KNEE ARTHROPLASTY;  Surgeon: Susa Day, MD;  Location: [facility];  Service: Orthopedics;  Laterality: Right;   Social History   Socioeconomic History  . Marital status: Widowed    Spouse name: Not on file  . Number of children: Not on file  . Years of education: Not on file  . Highest education level: Not on file  Occupational History  . Not on file  Social Needs  . Financial resource strain: Not on file  . Food insecurity:    Worry: Not on file    Inability: Not on file  . Transportation needs:    Medical: Not on file    Non-medical: Not on file  Tobacco Use  . Smoking status: Never Smoker  . Smokeless tobacco: Never Used  Substance and Sexual Activity  . Alcohol use: Yes    Comment: rare  . Drug use: No  . Sexual activity: Not Currently    Partners: Male  Lifestyle  . Physical activity:    Days per week: Not on file    Minutes per session: Not on file  . Stress: Not on file  Relationships  . Social connections:    Talks on phone: Not on file    Gets together: Not on file    Attends religious service: Not on file    Active member of club or organization: Not on file    Attends meetings of clubs or organizations: Not on file    Relationship status: Not on file  . Intimate partner violence:    Fear of current or ex partner: Not on file     Emotionally abused: Not on file    Physically abused: Not on file    Forced sexual activity: Not on file  Other Topics Concern  . 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 acetaminophen (TYLENOL) 650 MG CR tablet Take 650 mg by mouth every 8 (eight) hours as needed for pain.    Marland Kitchen aspirin EC 81 MG  tablet Take 81 mg by mouth daily.    Marland Kitchen atorvastatin (LIPITOR) 40 MG tablet TAKE 1 TABLET EVERY DAY 90 tablet 0  . citalopram (CELEXA) 40 MG tablet TAKE 1 TABLET (40 MG TOTAL) BY MOUTH DAILY. 90 tablet 1  . docusate sodium (COLACE) 100 MG capsule Take 1 capsule (100 mg total) by mouth 2 (two) times daily as needed for mild constipation. 30 capsule 1  . glipiZIDE (GLUCOTROL) 10 MG tablet Take 1 tablet (10 mg total) by mouth 2 (two) times daily before a meal. 60 tablet 3  . glucose blood (ACCU-CHEK GUIDE) test strip USE 1 STRIP TO CHECK GLUCOSE ONCE DAILY 100 each 4  . HYDROcodone-homatropine (HYCODAN) 5-1.5 MG/5ML syrup Take 5 mLs by mouth every 8 (eight) hours as needed for cough. 120 mL 0  . ibuprofen (ADVIL,MOTRIN) 800 MG tablet Take 800 mg by mouth 3 (three) times daily as needed for moderate pain.    . Insulin Admin Supplies MISC Use to inject insulin 2 times a day. 180 each 0  . Insulin Glargine (LANTUS SOLOSTAR) 100 UNIT/ML Solostar Pen Inject 12 Units into the skin at bedtime. 5 pen 11  . Insulin Pen Needle 32G X 4 MM MISC Use 1x a day 100 each 3  . Lancets (ACCU-CHEK MULTICLIX) lancets Use as instructed to test once a day DX E11.51 100 each 1  . lisinopril-hydrochlorothiazide (PRINZIDE,ZESTORETIC) 10-12.5 MG tablet TAKE 1 TABLET EVERY DAY 90 tablet 0  . Loratadine-Pseudoephedrine (CLARITIN-D 12 HOUR PO) Take 1 tablet by mouth daily as needed (sinus).     . Melatonin 3 MG TABS Take 3 mg by mouth at bedtime as needed (sleep).     . metFORMIN (GLUCOPHAGE-XR) 500 MG 24 hr tablet Take 2 tablets (1,000 mg total) by mouth 2 (two) times daily after a meal. 360 tablet 3  . Multiple Vitamins-Minerals (MULTIVITAMIN ADULT PO) Take 1 tablet by mouth daily.     Marland Kitchen omeprazole (PRILOSEC) 20 MG capsule TAKE 1 CAPSULE (20 MG TOTAL) BY MOUTH DAILY. 90 capsule 1  . ondansetron (ZOFRAN ODT) 4 MG disintegrating tablet Take 1 tablet (4 mg total) by mouth every 8 (eight) hours as needed for nausea or vomiting. 20 tablet 0   No current facility-administered medications on file prior to visit.    No Known Allergies Family History  Problem Relation Age of Onset  . Throat cancer Brother   . Heart disease Brother        MI  . Cancer Brother 80       throat,   . Hyperlipidemia Brother   . Cirrhosis Father   . Alcohol abuse Father   . Heart disease Brother   . Cancer Brother   . Arthritis Mother   . Hypertension Sister   . Arthritis Sister   . Cancer Brother   . Cancer Brother 27       melanoma  . Hypertension Brother   . Hyperlipidemia Brother   . Melanoma Other   . Arthritis Other     PE: There were no vitals taken for this visit. Wt Readings from Last 3 Encounters:  11/25/18 198 lb 4 oz (89.9 kg)  09/27/18 206 lb 9.6 oz (93.7 kg)  08/26/18 206 lb (93.4 kg)   Constitutional:  in NAD  The physical exam was not performed (virtual visit).  ASSESSMENT: 1. DM2, non-insulin-dependent, uncontrolled, with long-term complications: - CKD  2. HL   3. obesity  PLAN:  1. Patient with history of uncontrolled diabetes, on  oral antidiabetic regimen with metformin, glipizide, and SGLT 2 inhibitor previously, however, now off this due to price.  At last visit, since sugars were higher, we try to add a GLP-1 receptor agonist.  After starting Ozempic, she developed severe nausea and vomiting and had to go to the ED where she was hydrated.  Lipase was not elevated.  She is now feeling better, but we cannot use GLP-1 receptor agonist for  her for now. -At this visit, sugars are still quite high, mostly in the 200s and we discussed again about the need to add insulin. -I recommended to start Lantus (she has a friends that will give her vials) 12 units at bedtime and increase gradually until her sugars in the morning improved.  I advised her to stay in touch with me in the next few weeks to see if we need to adjust Lantus further. -We will continue metformin and glipizide for now. -Reviewed most recent HbA1c, which was higher, at 10.4% - I suggested to:  Patient Instructions  Please continue: - Metformin ER 1000 mg 2x a day  - Glipizide 5 mg 2x a day before breakfast and dinner  Please start: - Lantus 12 units at bedtime for 4 days, then increase by 4 units every 4 days if sugars in am not <140. Stop at 30 units.  Please return in 3 months with your sugar log.   - continue checking sugars at different times of the day - check 1x a day, rotating checks - advised for yearly eye exams >> she is UTD - Return to clinic in 3 mo with sugar log      2. HL - Reviewed latest lipid panel from 11/2018: LDL excellent, triglycerides high, HDL low Lab Results  Component Value Date   CHOL 145 11/30/2018   HDL 34.20 (L) 11/30/2018   LDLCALC 76 11/30/2018   LDLDIRECT 149.6 01/26/2013   TRIG 175.0 (H) 11/30/2018   CHOLHDL 4 11/30/2018  - Continues Lipitor without side effects.  3.  Obesity -She could not tolerate Ozempic due to nausea and vomiting. -She lost 8 pounds since last visit due to the above episode -Unfortunately, we need to start insulin, which will cause further weight gain but is necessary now for blood sugar control  Philemon Kingdom, MD PhD [facility] Endocrinology

## 2018-12-02 NOTE — Patient Instructions (Addendum)
Please continue: - Metformin ER 1000 mg 2x a day  - Glipizide 5 mg 2x a day before breakfast and dinner  Please start: - Lantus 12 units at bedtime for 4 days, then increase by 4 units every 4 days if sugars in am not <140. Stop at 30 units.  Please return in 3 months with your sugar log.

## 2018-12-21 ENCOUNTER — Other Ambulatory Visit: Payer: Self-pay | Admitting: Family Medicine

## 2018-12-21 ENCOUNTER — Encounter: Payer: Self-pay | Admitting: Internal Medicine

## 2018-12-21 ENCOUNTER — Encounter: Payer: Self-pay | Admitting: Family Medicine

## 2018-12-21 DIAGNOSIS — E785 Hyperlipidemia, unspecified: Secondary | ICD-10-CM

## 2018-12-21 MED ORDER — GLIPIZIDE 10 MG PO TABS: 10.0000 mg | ORAL_TABLET | Freq: Two times a day (BID) | ORAL | 3 refills | Status: DC

## 2018-12-21 MED ORDER — METFORMIN HCL ER 500 MG PO TB24: 1000.0000 mg | ORAL_TABLET | Freq: Two times a day (BID) | ORAL | 3 refills | Status: DC

## 2018-12-24 ENCOUNTER — Other Ambulatory Visit: Payer: Self-pay

## 2018-12-24 DIAGNOSIS — K219 Gastro-esophageal reflux disease without esophagitis: Secondary | ICD-10-CM

## 2018-12-24 MED ORDER — OMEPRAZOLE 20 MG PO CPDR: 20.0000 mg | DELAYED_RELEASE_CAPSULE | Freq: Every day | ORAL | 0 refills | Status: DC

## 2019-01-24 ENCOUNTER — Encounter: Payer: Self-pay | Admitting: Family Medicine

## 2019-03-30 ENCOUNTER — Other Ambulatory Visit: Payer: Self-pay | Admitting: Internal Medicine

## 2019-04-01 ENCOUNTER — Other Ambulatory Visit: Payer: Self-pay

## 2019-04-01 ENCOUNTER — Encounter: Payer: Self-pay | Admitting: Family Medicine

## 2019-04-01 ENCOUNTER — Ambulatory Visit (INDEPENDENT_AMBULATORY_CARE_PROVIDER_SITE_OTHER): Payer: Medicare Other | Admitting: Family Medicine

## 2019-04-01 VITALS — BP 130/60 | HR 60 | Temp 97.2°F | Resp 18 | Ht 62.0 in | Wt 209.4 lb

## 2019-04-01 DIAGNOSIS — Z23 Encounter for immunization: Secondary | ICD-10-CM | POA: Diagnosis not present

## 2019-04-01 DIAGNOSIS — E1165 Type 2 diabetes mellitus with hyperglycemia: Secondary | ICD-10-CM | POA: Diagnosis not present

## 2019-04-01 DIAGNOSIS — K219 Gastro-esophageal reflux disease without esophagitis: Secondary | ICD-10-CM

## 2019-04-01 DIAGNOSIS — E1151 Type 2 diabetes mellitus with diabetic peripheral angiopathy without gangrene: Secondary | ICD-10-CM | POA: Diagnosis not present

## 2019-04-01 DIAGNOSIS — R202 Paresthesia of skin: Secondary | ICD-10-CM | POA: Diagnosis not present

## 2019-04-01 DIAGNOSIS — R200 Anesthesia of skin: Secondary | ICD-10-CM

## 2019-04-01 DIAGNOSIS — F32A Depression, unspecified: Secondary | ICD-10-CM

## 2019-04-01 DIAGNOSIS — E785 Hyperlipidemia, unspecified: Secondary | ICD-10-CM

## 2019-04-01 DIAGNOSIS — F329 Major depressive disorder, single episode, unspecified: Secondary | ICD-10-CM | POA: Diagnosis not present

## 2019-04-01 DIAGNOSIS — F418 Other specified anxiety disorders: Secondary | ICD-10-CM | POA: Diagnosis not present

## 2019-04-01 DIAGNOSIS — IMO0002 Reserved for concepts with insufficient information to code with codable children: Secondary | ICD-10-CM

## 2019-04-01 DIAGNOSIS — I10 Essential (primary) hypertension: Secondary | ICD-10-CM

## 2019-04-01 LAB — COMPREHENSIVE METABOLIC PANEL
ALT: 13 U/L (ref 0–35)
AST: 14 U/L (ref 0–37)
Albumin: 4 g/dL (ref 3.5–5.2)
Alkaline Phosphatase: 88 U/L (ref 39–117)
BUN: 21 mg/dL (ref 6–23)
CO2: 25 mEq/L (ref 19–32)
Calcium: 9.2 mg/dL (ref 8.4–10.5)
Chloride: 105 mEq/L (ref 96–112)
Creatinine, Ser: 1.12 mg/dL (ref 0.40–1.20)
GFR: 47.87 mL/min — ABNORMAL LOW (ref >60.00)
Glucose, Bld: 138 mg/dL — ABNORMAL HIGH (ref 70–99)
Potassium: 4.7 mEq/L (ref 3.5–5.1)
Sodium: 137 mEq/L (ref 135–145)
Total Bilirubin: 0.5 mg/dL (ref 0.2–1.2)
Total Protein: 6.7 g/dL (ref 6.0–8.3)

## 2019-04-01 LAB — LIPID PANEL
Cholesterol: 159 mg/dL (ref 0–200)
HDL: 38.8 mg/dL — ABNORMAL LOW (ref >39.00)
LDL Cholesterol: 98 mg/dL (ref 0–99)
NonHDL: 120.61
Total CHOL/HDL Ratio: 4
Triglycerides: 113 mg/dL (ref 0.0–149.0)
VLDL: 22.6 mg/dL (ref 0.0–40.0)

## 2019-04-01 LAB — HEMOGLOBIN A1C: Hgb A1c MFr Bld: 8.6 % — ABNORMAL HIGH (ref 4.6–6.5)

## 2019-04-01 MED ORDER — OMEPRAZOLE 20 MG PO CPDR: 20.0000 mg | DELAYED_RELEASE_CAPSULE | Freq: Every day | ORAL | 3 refills | Status: DC

## 2019-04-01 MED ORDER — ATORVASTATIN CALCIUM 40 MG PO TABS: 40.0000 mg | ORAL_TABLET | Freq: Every day | ORAL | 1 refills | Status: DC

## 2019-04-01 MED ORDER — LISINOPRIL-HYDROCHLOROTHIAZIDE 10-12.5 MG PO TABS: 1.0000 | ORAL_TABLET | Freq: Every day | ORAL | 1 refills | Status: DC

## 2019-04-01 MED ORDER — CITALOPRAM HYDROBROMIDE 40 MG PO TABS: ORAL_TABLET | ORAL | 3 refills | Status: DC

## 2019-04-01 NOTE — Progress Notes (Signed)
Patient ID: Bethany Miller, female    DOB: [birth year]  Age: 73 y.o. MRN: [id number]    Subjective:  Subjective  HPI Bethany Miller presents for f/u and med refills.  She is also c/o numbness in her fingers of R hand   She is requesting a referral to a hand specialist No other complaints.   Review of Systems  Constitutional: Negative for appetite change, diaphoresis, fatigue and unexpected weight change.  Eyes: Negative for pain, redness and visual disturbance.  Respiratory: Negative for cough, chest tightness, shortness of breath and wheezing.   Cardiovascular: Negative for chest pain, palpitations and leg swelling.  Endocrine: Negative for cold intolerance, heat intolerance, polydipsia, polyphagia and polyuria.  Genitourinary: Negative for difficulty urinating, dysuria and frequency.  Neurological: Positive for numbness. Negative for dizziness, light-headedness and headaches.    History Past Medical History:  Diagnosis Date  . Diabetes mellitus   . Hyperlipidemia   . Hypertension   . 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   Her family history includes Alcohol abuse in her father; Arthritis in her mother, sister, and another family member; Cancer in her brother and brother; Cancer (age of onset: 81) in her brother; Cancer (age of onset: 25) in her brother; Cirrhosis in her father; Heart disease in her brother and brother; Hyperlipidemia in her brother and brother; Hypertension in her brother and sister; Melanoma in an other family member; Throat cancer in her brother.She reports that she has never smoked. She has never used smokeless tobacco. She reports current alcohol use. She reports that she does not use drugs.  Current Outpatient Medications on File Prior to Visit  Medication Sig Dispense Refill  . acetaminophen (TYLENOL)  650 MG CR tablet Take 650 mg by mouth every 8 (eight) hours as needed for pain.    Marland Kitchen aspirin EC 81 MG tablet Take 81 mg by mouth daily.    Marland Kitchen docusate sodium (COLACE) 100 MG capsule Take 1 capsule (100 mg total) by mouth 2 (two) times daily as needed for mild constipation. 30 capsule 1  . glipiZIDE (GLUCOTROL) 10 MG tablet TAKE 1 TABLET TWICE DAILY BEFORE MEALS. STOP JARDIANCE AND GLIPIZIDE 5MG  180 tablet 1  . glucose blood (ACCU-CHEK GUIDE) test strip USE 1 STRIP TO CHECK GLUCOSE ONCE DAILY 100 each 4  . HYDROcodone-homatropine (HYCODAN) 5-1.5 MG/5ML syrup Take 5 mLs by mouth every 8 (eight) hours as needed for cough. 120 mL 0  . ibuprofen (ADVIL,MOTRIN) 800 MG tablet Take 800 mg by mouth 3 (three) times daily as needed for moderate pain.    . Insulin Admin Supplies MISC Use to inject insulin 2 times a day. 180 each 0  . Insulin Glargine (LANTUS SOLOSTAR) 100 UNIT/ML Solostar Pen Inject 12 Units into the skin at bedtime. 5 pen 11  . Insulin Pen Needle 32G X 4 MM MISC Use 1x a day 100 each 3  . Lancets (ACCU-CHEK MULTICLIX) lancets Use as instructed to test once a day DX E11.51 100 each 1  . Loratadine-Pseudoephedrine (CLARITIN-D 12 HOUR PO) Take 1 tablet by mouth daily as needed (sinus).     . Melatonin 3 MG TABS Take 3 mg by mouth at bedtime as needed (sleep).    . metFORMIN (GLUCOPHAGE-XR) 500 MG 24 hr tablet Take 2 tablets (1,000 mg total) by mouth 2 (two) times daily after a meal. 360 tablet  3  . Multiple Vitamins-Minerals (MULTIVITAMIN ADULT PO) Take 1 tablet by mouth daily.     . ondansetron (ZOFRAN ODT) 4 MG disintegrating tablet Take 1 tablet (4 mg total) by mouth every 8 (eight) hours as needed for nausea or vomiting. 20 tablet 0   No current facility-administered medications on file prior to visit.      Objective:  Objective  Physical Exam Vitals signs and nursing note reviewed.  Constitutional:      Appearance: She is well-developed.  HENT:     Head: Normocephalic and  atraumatic.  Eyes:     Conjunctiva/sclera: Conjunctivae normal.  Neck:     Musculoskeletal: Normal range of motion and neck supple.     Thyroid: No thyromegaly.     Vascular: No carotid bruit or JVD.  Cardiovascular:     Rate and Rhythm: Normal rate and regular rhythm.     Heart sounds: Normal heart sounds. No murmur.  Pulmonary:     Effort: Pulmonary effort is normal. No respiratory distress.     Breath sounds: Normal breath sounds. No wheezing or rales.  Chest:     Chest wall: No tenderness.  Neurological:     Mental Status: She is alert and oriented to person, place, and time.    BP 130/60 (BP Location: Right Arm, Patient Position: Sitting, Cuff Size: Normal)   Pulse 60   Temp (!) 97.2 F (36.2 C) (Temporal)   Resp 18   Ht 5\' 2"  (1.575 m)   Wt 209 lb 6.4 oz (95 kg)   SpO2 95%   BMI 38.30 kg/m  Wt Readings from Last 3 Encounters:  04/01/19 209 lb 6.4 oz (95 kg)  11/25/18 198 lb 4 oz (89.9 kg)  09/27/18 206 lb 9.6 oz (93.7 kg)     Lab Results  Component Value Date   WBC 10.3 11/25/2018   HGB 13.8 11/25/2018   HCT 41.9 11/25/2018   PLT 360 11/25/2018   GLUCOSE 180 (H) 11/30/2018   CHOL 145 11/30/2018   TRIG 175.0 (H) 11/30/2018   HDL 34.20 (L) 11/30/2018   LDLDIRECT 149.6 01/26/2013   LDLCALC 76 11/30/2018   ALT 12 11/30/2018   AST 11 11/30/2018   NA 137 11/30/2018   K 4.6 11/30/2018   CL 104 11/30/2018   CREATININE 1.01 11/30/2018   BUN 16 11/30/2018   CO2 27 11/30/2018   TSH 3.82 06/10/2017   INR 0.96 02/17/2017   HGBA1C 10.4 (H) 11/30/2018   MICROALBUR 0.6 03/28/2016    No results found.   Assessment & Plan:  Plan  I have changed Bethany Miller's lisinopril-hydrochlorothiazide, atorvastatin, and citalopram. I am also having her maintain her Multiple Vitamins-Minerals (MULTIVITAMIN ADULT PO), Loratadine-Pseudoephedrine (CLARITIN-D 12 HOUR PO), ibuprofen, Melatonin, acetaminophen, docusate sodium, aspirin EC, accu-chek multiclix, glucose blood,  HYDROcodone-homatropine, Insulin Glargine, Insulin Pen Needle, Insulin Admin Supplies, ondansetron, metFORMIN, glipiZIDE, and omeprazole.  Meds ordered this encounter  Medications  . omeprazole (PRILOSEC) 20 MG capsule    Sig: Take 1 capsule (20 mg total) by mouth daily.    Dispense:  90 capsule    Refill:  3  . lisinopril-hydrochlorothiazide (ZESTORETIC) 10-12.5 MG tablet    Sig: Take 1 tablet by mouth daily.    Dispense:  90 tablet    Refill:  1  . atorvastatin (LIPITOR) 40 MG tablet    Sig: Take 1 tablet (40 mg total) by mouth daily.    Dispense:  90 tablet    Refill:  1  .  citalopram (CELEXA) 40 MG tablet    Sig: 1 po qd    Dispense:  90 tablet    Refill:  3    Problem List Items Addressed This Visit      Unprioritized   Depression with anxiety    Stable  con't meds      Relevant Medications   citalopram (CELEXA) 40 MG tablet   DM (diabetes mellitus) type II uncontrolled, periph vascular disorder ([place])    hgba1c to be checked minimize simple carbs. Increase exercise as tolerated. Continue current meds F/u endo      Relevant Medications   lisinopril-hydrochlorothiazide (ZESTORETIC) 10-12.5 MG tablet   atorvastatin (LIPITOR) 40 MG tablet   ESSENTIAL HYPERTENSION, BENIGN    Well controlled, no changes to meds. Encouraged heart healthy diet such as the DASH diet and exercise as tolerated.       Relevant Medications   lisinopril-hydrochlorothiazide (ZESTORETIC) 10-12.5 MG tablet   atorvastatin (LIPITOR) 40 MG tablet   Hyperlipidemia LDL goal <70    Encouraged heart healthy diet, increase exercise, avoid trans fats, consider a krill oil cap daily      Relevant Medications   lisinopril-hydrochlorothiazide (ZESTORETIC) 10-12.5 MG tablet   atorvastatin (LIPITOR) 40 MG tablet   Other Relevant Orders   Lipid panel   Comprehensive metabolic panel    Other Visit Diagnoses    Numbness and tingling in right hand    -  Primary   Relevant Orders   Ambulatory referral  to Orthopedic Surgery   Gastroesophageal reflux disease, esophagitis presence not specified       Relevant Medications   omeprazole (PRILOSEC) 20 MG capsule   Essential hypertension       Relevant Medications   lisinopril-hydrochlorothiazide (ZESTORETIC) 10-12.5 MG tablet   atorvastatin (LIPITOR) 40 MG tablet   Other Relevant Orders   Lipid panel   Comprehensive metabolic panel   Depression, unspecified depression type       Relevant Medications   citalopram (CELEXA) 40 MG tablet   Uncontrolled type 2 diabetes mellitus with hyperglycemia ([facility])       Relevant Medications   lisinopril-hydrochlorothiazide (ZESTORETIC) 10-12.5 MG tablet   atorvastatin (LIPITOR) 40 MG tablet   Other Relevant Orders   Hemoglobin A1c   Need for influenza vaccination       Relevant Orders   Flu Vaccine QUAD High Dose(Fluad) (Completed)   Need for vaccination against Streptococcus pneumoniae       Relevant Orders   Pneumococcal polysaccharide vaccine 23-valent greater than or equal to 2yo subcutaneous/IM (Completed)      Follow-up: Return in about 6 months (around 09/29/2019), or if symptoms worsen or fail to improve.  Ann Held, DO

## 2019-04-01 NOTE — Assessment & Plan Note (Signed)
Encouraged heart healthy diet, increase exercise, avoid trans fats, consider a krill oil cap daily 

## 2019-04-01 NOTE — Patient Instructions (Signed)
Carpal Tunnel Syndrome  Carpal tunnel syndrome is a condition that causes pain in your hand and arm. The carpal tunnel is a narrow area located on the palm side of your wrist. Repeated wrist motion or certain diseases may cause swelling within the tunnel. This swelling pinches the main nerve in the wrist (median nerve). What are the causes? This condition may be caused by:  Repeated wrist motions.  Wrist injuries.  Arthritis.  A cyst or tumor in the carpal tunnel.  Fluid buildup during pregnancy. Sometimes the cause of this condition is not known. What increases the risk? The following factors may make you more likely to develop this condition:  Having a job, such as being a butcher or a cashier, that requires you to repeatedly move your wrist in the same motion.  Being a woman.  Having certain conditions, such as: ? Diabetes. ? Obesity. ? An underactive thyroid (hypothyroidism). ? Kidney failure. What are the signs or symptoms? Symptoms of this condition include:  A tingling feeling in your fingers, especially in your thumb, index, and middle fingers.  Tingling or numbness in your hand.  An aching feeling in your entire arm, especially when your wrist and elbow are bent for a long time.  Wrist pain that goes up your arm to your shoulder.  Pain that goes down into your palm or fingers.  A weak feeling in your hands. You may have trouble grabbing and holding items. Your symptoms may feel worse during the night. How is this diagnosed? This condition is diagnosed with a medical history and physical exam. You may also have tests, including:  Electromyogram (EMG). This test measures electrical signals sent by your nerves into the muscles.  Nerve conduction study. This test measures how well electrical signals pass through your nerves.  Imaging tests, such as X-rays, ultrasound, and MRI. These tests check for possible causes of your condition. How is this treated? This  condition may be treated with:  Lifestyle changes. It is important to stop or change the activity that caused your condition.  Doing exercise and activities to strengthen your muscles and bones (physical therapy).  Learning how to use your hand again after diagnosis (occupational therapy).  Medicines for pain and inflammation. This may include medicine that is injected into your wrist.  A wrist splint.  Surgery. Follow these instructions at home: If you have a splint:  Wear the splint as told by your health care provider. Remove it only as told by your health care provider.  Loosen the splint if your fingers tingle, become numb, or turn cold and blue.  Keep the splint clean.  If the splint is not waterproof: ? Do not let it get wet. ? Cover it with a watertight covering when you take a bath or shower. Managing pain, stiffness, and swelling   If directed, put ice on the painful area: ? If you have a removable splint, remove it as told by your health care provider. ? Put ice in a plastic bag. ? Place a towel between your skin and the bag. ? Leave the ice on for 20 minutes, 2-3 times per day. General instructions  Take over-the-counter and prescription medicines only as told by your health care provider.  Rest your wrist from any activity that may be causing your pain. If your condition is work related, talk with your employer about changes that can be made, such as getting a wrist pad to use while typing.  Do any exercises as told   by your health care provider, physical therapist, or occupational therapist.  Keep all follow-up visits as told by your health care provider. This is important. Contact a health care provider if:  You have new symptoms.  Your pain is not controlled with medicines.  Your symptoms get worse. Get help right away if:  You have severe numbness or tingling in your wrist or hand. Summary  Carpal tunnel syndrome is a condition that causes pain in  your hand and arm.  It is usually caused by repeated wrist motions.  Lifestyle changes and medicines are used to treat carpal tunnel syndrome. Surgery may be recommended.  Follow your health care provider's instructions about wearing a splint, resting from activity, keeping follow-up visits, and calling for help. This information is not intended to replace advice given to you by your health care provider. Make sure you discuss any questions you have with your health care provider. Document Released: 06/27/2000 Document Revised: 11/06/2017 Document Reviewed: 11/06/2017 Elsevier Patient Education  2020 [employer]  

## 2019-04-01 NOTE — Assessment & Plan Note (Signed)
hgba1c to be checked minimize simple carbs. Increase exercise as tolerated. Continue current meds F/u endo

## 2019-04-01 NOTE — Assessment & Plan Note (Signed)
Stable con't meds 

## 2019-04-01 NOTE — Assessment & Plan Note (Signed)
Well controlled, no changes to meds. Encouraged heart healthy diet such as the DASH diet and exercise as tolerated.  °

## 2019-04-06 NOTE — Progress Notes (Signed)
Virtual Visit via Video Note  I connected with patient on 04/07/19 at  8:45 AM EDT by audio enabled telemedicine application and verified that I am speaking with the correct person using two identifiers.   THIS ENCOUNTER IS A VIRTUAL VISIT DUE TO COVID-19 - PATIENT WAS NOT SEEN IN THE OFFICE. PATIENT HAS CONSENTED TO VIRTUAL VISIT / TELEMEDICINE VISIT   Location of patient: home  Location of provider: office  I discussed the limitations of evaluation and management by telemedicine and the availability of in person appointments. The patient expressed understanding and agreed to proceed.   Subjective:   Bethany Miller is a 73 y.o. female who presents for Medicare Annual (Subsequent) preventive examination.  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  Review of Systems:  Home Safety/Smoke Alarms: Feels safe in home. Smoke alarms in place.  Lives in 1 story home. Son lives with her.    Female:         Mammo-  ordered     Dexa scan- declines       CCS- pt states she will discuss Cologuard w/ PCP in march    Objective:     Vitals: Unable to assess. This visit is enabled though telemedicine due to Covid 19.   Advanced Directives 04/07/2019 11/25/2018 10/13/2017 04/21/2017 02/26/2017 02/26/2017 02/17/2017  Does Patient Have a Medical Advance Directive? No No No No No No No  Would patient like information on creating a medical advance directive? Yes (MAU/Ambulatory/Procedural Areas - Information given) - Yes (MAU/Ambulatory/Procedural Areas - Information given) - No - Patient declined No - Patient declined Yes (MAU/Ambulatory/Procedural Areas - Information given)    Tobacco Social History   Tobacco Use  Smoking Status Never Smoker  Smokeless Tobacco Never Used     Counseling given: Not Answered   Clinical Intake: Pain : No/denies pain    Past Medical History:  Diagnosis Date  . Diabetes mellitus   . Hyperlipidemia   . Hypertension   . Murmur    Past  Surgical History:  Procedure Laterality Date  . ABDOMINAL HYSTERECTOMY    . BACK SURGERY    . CHOLECYSTECTOMY    . TONSILLECTOMY    . TOTAL KNEE ARTHROPLASTY    . TOTAL KNEE ARTHROPLASTY Right 02/26/2017   Procedure: RIGHT TOTAL KNEE ARTHROPLASTY;  Surgeon: Susa Day, MD;  Location: [facility];  Service: Orthopedics;  Laterality: Right;   Family History  Problem Relation Age of Onset  . Throat cancer Brother   . Heart disease Brother        MI  . Cancer Brother 80       throat,   . Hyperlipidemia Brother   . Cirrhosis Father   . Alcohol abuse Father   . Heart disease Brother   . Cancer Brother   . Arthritis Mother   . Hypertension Sister   . Arthritis Sister   . Alzheimer's disease Sister   . Cancer Brother   . Cancer Brother 84       melanoma  . Hypertension Brother   . Hyperlipidemia Brother   . Melanoma Other   . Arthritis Other    Social History   Socioeconomic History  . Marital status: Widowed    Spouse name: Not on file  . Number of children: Not on file  . Years of education: Not on file  . Highest education level: Not on file  Occupational History  . Not on file  Social Needs  . Financial  resource strain: Not on file  . Food insecurity    Worry: Not on file    Inability: Not on file  . Transportation needs    Medical: Not on file    Non-medical: Not on file  Tobacco Use  . Smoking status: Never Smoker  . Smokeless tobacco: Never Used  Substance and Sexual Activity  . Alcohol use: Yes    Comment: rare  . Drug use: No  . Sexual activity: Not Currently    Partners: Male  Lifestyle  . Physical activity    Days per week: Not on file    Minutes per session: Not on file  . Stress: Not on file  Relationships  . Social Herbalist on phone: Not on file    Gets together: Not on file    Attends religious service: Not on file    Active member of club or organization: Not on file    Attends meetings of clubs or organizations: Not on file     Relationship status: Not on file  Other Topics Concern  . 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 acetaminophen (TYLENOL) 650 MG CR tablet Take 650 mg by mouth every 8 (eight) hours as needed for pain.  Marland Kitchen aspirin EC 81 MG tablet Take 81 mg by mouth daily.  Marland Kitchen atorvastatin (LIPITOR) 40 MG tablet Take 1 tablet (40 mg total) by mouth daily.  . citalopram (CELEXA) 40 MG tablet 1 po qd  . docusate sodium (COLACE) 100 MG capsule Take 1 capsule (100 mg total) by mouth 2 (two) times daily as needed for mild constipation.  Marland Kitchen glipiZIDE (GLUCOTROL) 10 MG tablet TAKE 1 TABLET TWICE DAILY BEFORE MEALS. STOP JARDIANCE AND GLIPIZIDE 5MG   . glucose blood (ACCU-CHEK GUIDE) test strip USE 1 STRIP TO CHECK GLUCOSE ONCE DAILY  . ibuprofen (ADVIL,MOTRIN) 800 MG tablet Take 800 mg by mouth 3 (three) times daily as needed for moderate pain.  . Insulin Admin Supplies MISC Use to inject insulin 2 times a day.  . Insulin Glargine (LANTUS SOLOSTAR) 100 UNIT/ML Solostar Pen Inject 12 Units into the skin at bedtime.  . Insulin Pen Needle 32G X 4 MM MISC Use 1x a day  . Lancets (ACCU-CHEK MULTICLIX) lancets Use as instructed to test once a day DX E11.51  . lisinopril-hydrochlorothiazide (ZESTORETIC) 10-12.5 MG tablet Take 1 tablet by mouth daily.  . Loratadine-Pseudoephedrine (CLARITIN-D 12 HOUR PO) Take 1 tablet by mouth daily as needed (sinus).   . Melatonin 3 MG TABS Take 3 mg by mouth at bedtime as needed (sleep).  . metFORMIN (GLUCOPHAGE-XR) 500 MG 24 hr tablet Take 2 tablets (1,000 mg total) by mouth 2 (two) times daily after a meal.  . Multiple Vitamins-Minerals (MULTIVITAMIN ADULT PO) Take 1 tablet by mouth daily.   Marland Kitchen omeprazole (PRILOSEC) 20 MG capsule Take 1 capsule (20 mg total) by mouth daily.  Marland Kitchen HYDROcodone-homatropine (HYCODAN) 5-1.5 MG/5ML syrup Take 5 mLs  by mouth every 8 (eight) hours as needed for cough. (Patient not taking: Reported on 04/07/2019)  . ondansetron (ZOFRAN ODT) 4 MG disintegrating tablet Take 1 tablet (4 mg total) by mouth every 8 (eight) hours as needed for nausea or vomiting. (Patient not taking: Reported on 04/07/2019)   No facility-administered encounter medications on file as of 04/07/2019.  Activities of Daily Living In your present state of health, do you have any difficulty performing the following activities: 04/07/2019  Hearing? N  Vision? N  Difficulty concentrating or making decisions? N  Walking or climbing stairs? N  Dressing or bathing? N  Doing errands, shopping? N  Preparing Food and eating ? N  Using the Toilet? N  In the past six months, have you accidently leaked urine? N  Do you have problems with loss of bowel control? N  Managing your Medications? N  Managing your Finances? N  Housekeeping or managing your Housekeeping? N  Some recent data might be hidden    Patient Care Team: Carollee Herter, Alferd Apa, DO as PCP - General Philemon Kingdom, MD as Consulting Physician (Internal Medicine)    Assessment:   This is a routine wellness examination for Bethany Miller. Physical assessment deferred to PCP.  Exercise Activities and Dietary recommendations Current Exercise Habits: The patient does not participate in regular exercise at present, Exercise limited by: None identified Diet (meal preparation, eat out, water intake, caffeinated beverages, dairy products, fruits and vegetables): in general, a "healthy" diet  , well balanced    Goals    . Weight (lb) < 170 lb (77.1 kg)     Exercise at least 2x/ week. Eat healthier.        Fall Risk Fall Risk  04/07/2019 10/13/2017 04/21/2017 11/16/2015  Falls in the past year? 1 No No No  Comment tripped in flip flops and tripped over hose in yard - - -  Number falls in past yr: 1 - - -  Injury with Fall? 1 - - -  Follow up Education provided;Falls prevention  discussed - - -     Depression Screen PHQ 2/9 Scores 04/07/2019 03/02/2018 10/13/2017 04/21/2017  PHQ - 2 Score 0 0 0 1  PHQ- 9 Score - 2 - -     Cognitive Function Ad8 score reviewed for issues:  Issues making decisions: no  Less interest in hobbies / activities:no  Repeats questions, stories (family complaining):no  Trouble using ordinary gadgets (microwave, computer, phone):no  Forgets the month or year: no  Mismanaging finances: no  Remembering appts:no  Daily problems with thinking and/or memory:no Ad8 score is=0     MMSE - Mini Mental State Exam 10/13/2017  Orientation to time 5  Orientation to Place 5  Registration 3  Attention/ Calculation 5  Recall 3  Language- name 2 objects 2  Language- repeat 1  Language- follow 3 step command 3  Language- read & follow direction 1  Write a sentence 1  Copy design 1  Total score 30        Immunization History  Administered Date(s) Administered  . Fluad Quad(high Dose 65+) 04/01/2019  . Influenza Split 05/20/2011, 03/24/2012  . Influenza Whole 04/16/2009, 04/30/2010  . Influenza, High Dose Seasonal PF 03/28/2016, 04/29/2017, 03/31/2018  . Influenza,inj,Quad PF,6+ Mos 04/08/2013  . Pneumococcal Conjugate-13 03/28/2016  . Pneumococcal Polysaccharide-23 04/01/2019  . Td 11/26/2009  . Zoster 01/02/2010    Screening Tests Health Maintenance  Topic Date Due  . DEXA SCAN  08/21/2012  . OPHTHALMOLOGY EXAM  11/26/2013  . FOOT EXAM  08/26/2017  . COLONOSCOPY  06/26/2018  . HEMOGLOBIN A1C  09/29/2019  . MAMMOGRAM  10/14/2019  . TETANUS/TDAP  11/27/2019  . INFLUENZA VACCINE  Completed  . Hepatitis C Screening  Completed  . PNA vac Low Risk Adult  Completed     Plan:   See you next  year!  Continue to eat heart healthy diet (full of fruits, vegetables, whole grains, lean protein, water--limit salt, fat, and sugar intake) and increase physical activity as tolerated.  Continue doing brain stimulating activities  (puzzles, reading, adult coloring books, staying active) to keep memory sharp.     I have personally reviewed and noted the following in the patient's chart:   . Medical and social history . Use of alcohol, tobacco or illicit drugs  . Current medications and supplements . Functional ability and status . Nutritional status . Physical activity . Advanced directives . List of other physicians . Hospitalizations, surgeries, and ER visits in previous 12 months . Vitals . Screenings to include cognitive, depression, and falls . Referrals and appointments  In addition, I have reviewed and discussed with patient certain preventive protocols, quality metrics, and best practice recommendations. A written personalized care plan for preventive services as well as general preventive health recommendations were provided to patient.     Naaman Plummer [place], [place]  04/07/2019

## 2019-04-07 ENCOUNTER — Ambulatory Visit (INDEPENDENT_AMBULATORY_CARE_PROVIDER_SITE_OTHER): Payer: Medicare Other | Admitting: *Deleted

## 2019-04-07 ENCOUNTER — Other Ambulatory Visit: Payer: Self-pay

## 2019-04-07 ENCOUNTER — Encounter: Payer: Self-pay | Admitting: *Deleted

## 2019-04-07 DIAGNOSIS — Z0000 Encounter for general adult medical examination without abnormal findings: Secondary | ICD-10-CM

## 2019-04-07 NOTE — Patient Instructions (Signed)
See you next year!  Continue to eat heart healthy diet (full of fruits, vegetables, whole grains, lean protein, water--limit salt, fat, and sugar intake) and increase physical activity as tolerated.  Continue doing brain stimulating activities (puzzles, reading, adult coloring books, staying active) to keep memory sharp.    Bethany Miller , Thank you for taking time to come for your Medicare Wellness Visit. I appreciate your ongoing commitment to your health goals. Please review the following plan we discussed and let me know if I can assist you in the future.   These are the goals we discussed: Goals    . Increase physical activity    . Weight (lb) < 170 lb (77.1 kg)     Exercise at least 2x/ week. Eat healthier.        This is a list of the screening recommended for you and due dates:  Health Maintenance  Topic Date Due  . DEXA scan (bone density measurement)  08/21/2012  . Eye exam for diabetics  11/26/2013  . Complete foot exam   08/26/2017  . Colon Cancer Screening  06/26/2018  . Hemoglobin A1C  09/29/2019  . Mammogram  10/14/2019  . Tetanus Vaccine  11/27/2019  . Flu Shot  Completed  .  Hepatitis C: One time screening is recommended by Center for Disease Control  (CDC) for  adults born from [birth year] through 1963.   Completed  . Pneumonia vaccines  Completed    Health Maintenance After Age 94 After age 85, you are at a higher risk for certain long-term diseases and infections as well as injuries from falls. Falls are a major cause of broken bones and head injuries in people who are older than age 64. Getting regular preventive care can help to keep you healthy and well. Preventive care includes getting regular testing and making lifestyle changes as recommended by your health care provider. Talk with your health care provider about:  Which screenings and tests you should have. A screening is a test that checks for a disease when you have no symptoms.  A diet and exercise plan that  is right for you. What should I know about screenings and tests to prevent falls? Screening and testing are the best ways to find a health problem early. Early diagnosis and treatment give you the best chance of managing medical conditions that are common after age 31. Certain conditions and lifestyle choices may make you more likely to have a fall. Your health care provider may recommend:  Regular vision checks. Poor vision and conditions such as cataracts can make you more likely to have a fall. If you wear glasses, make sure to get your prescription updated if your vision changes.  Medicine review. Work with your health care provider to regularly review all of the medicines you are taking, including over-the-counter medicines. Ask your health care provider about any side effects that may make you more likely to have a fall. Tell your health care provider if any medicines that you take make you feel dizzy or sleepy.  Osteoporosis screening. Osteoporosis is a condition that causes the bones to get weaker. This can make the bones weak and cause them to break more easily.  Blood pressure screening. Blood pressure changes and medicines to control blood pressure can make you feel dizzy.  Strength and balance checks. Your health care provider may recommend certain tests to check your strength and balance while standing, walking, or changing positions.  Foot health exam. Foot pain and  numbness, as well as not wearing proper footwear, can make you more likely to have a fall.  Depression screening. You may be more likely to have a fall if you have a fear of falling, feel emotionally low, or feel unable to do activities that you used to do.  Alcohol use screening. Using too much alcohol can affect your balance and may make you more likely to have a fall. What actions can I take to lower my risk of falls? General instructions  Talk with your health care provider about your risks for falling. Tell your  health care provider if: ? You fall. Be sure to tell your health care provider about all falls, even ones that seem minor. ? You feel dizzy, sleepy, or off-balance.  Take over-the-counter and prescription medicines only as told by your health care provider. These include any supplements.  Eat a healthy diet and maintain a healthy weight. A healthy diet includes low-fat dairy products, low-fat (lean) meats, and fiber from whole grains, beans, and lots of fruits and vegetables. Home safety  Remove any tripping hazards, such as rugs, cords, and clutter.  Install safety equipment such as grab bars in bathrooms and safety rails on stairs.  Keep rooms and walkways well-lit. Activity   Follow a regular exercise program to stay fit. This will help you maintain your balance. Ask your health care provider what types of exercise are appropriate for you.  If you need a cane or walker, use it as recommended by your health care provider.  Wear supportive shoes that have nonskid soles. Lifestyle  Do not drink alcohol if your health care provider tells you not to drink.  If you drink alcohol, limit how much you have: ? 0-1 drink a day for women. ? 0-2 drinks a day for men.  Be aware of how much alcohol is in your drink. In the [place] one drink equals one typical bottle of beer (12 oz), one-half glass of wine (5 oz), or one shot of hard liquor (1 oz).  Do not use any products that contain nicotine or tobacco, such as cigarettes and e-cigarettes. If you need help quitting, ask your health care provider. Summary  Having a healthy lifestyle and getting preventive care can help to protect your health and wellness after age 78.  Screening and testing are the best way to find a health problem early and help you avoid having a fall. Early diagnosis and treatment give you the best chance for managing medical conditions that are more common for people who are older than age 81.  Falls are a major cause  of broken bones and head injuries in people who are older than age 35. Take precautions to prevent a fall at home.  Work with your health care provider to learn what changes you can make to improve your health and wellness and to prevent falls. This information is not intended to replace advice given to you by your health care provider. Make sure you discuss any questions you have with your health care provider. Document Released: 05/13/2017 Document Revised: 10/21/2018 Document Reviewed: 05/13/2017 Elsevier Patient Education  2020 [employer].

## 2019-05-04 DIAGNOSIS — H2513 Age-related nuclear cataract, bilateral: Secondary | ICD-10-CM | POA: Diagnosis not present

## 2019-05-04 DIAGNOSIS — Z7984 Long term (current) use of oral hypoglycemic drugs: Secondary | ICD-10-CM | POA: Diagnosis not present

## 2019-05-04 DIAGNOSIS — E119 Type 2 diabetes mellitus without complications: Secondary | ICD-10-CM | POA: Diagnosis not present

## 2019-05-04 LAB — HM DIABETES EYE EXAM

## 2019-05-10 DIAGNOSIS — M79641 Pain in right hand: Secondary | ICD-10-CM | POA: Insufficient documentation

## 2019-05-11 DIAGNOSIS — G5601 Carpal tunnel syndrome, right upper limb: Secondary | ICD-10-CM | POA: Diagnosis not present

## 2019-05-11 DIAGNOSIS — M79641 Pain in right hand: Secondary | ICD-10-CM | POA: Diagnosis not present

## 2019-05-11 IMAGING — DX DG CHEST 2V
2 series · 2 of 2 positions shown · non-contrast
Comparison: 11/11/2016

CLINICAL DATA: Shortness of breath with exertion. Chest pressure.
Upper extremity tingling.

EXAM:
CHEST  2 VIEW

[chest pa]
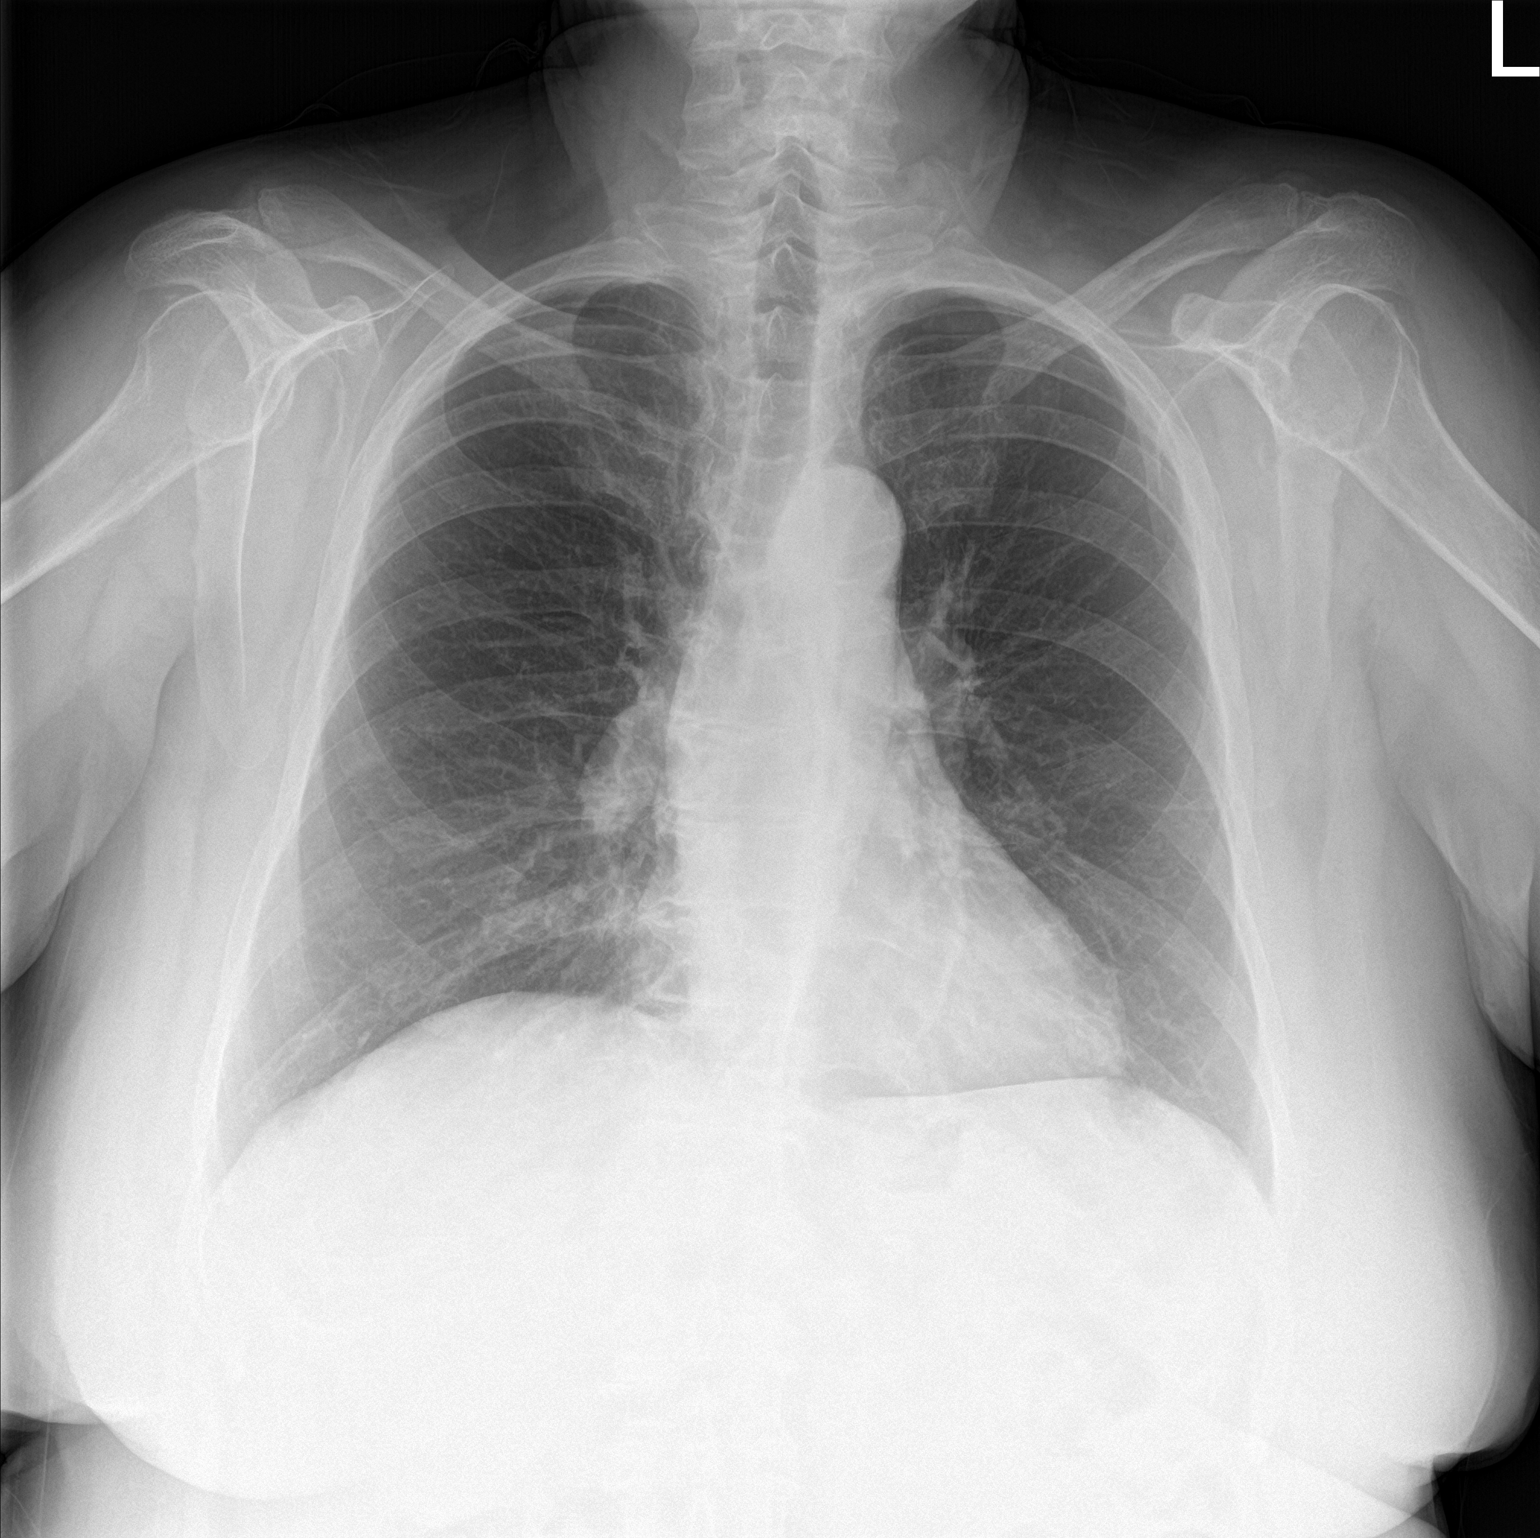

[chest lat]
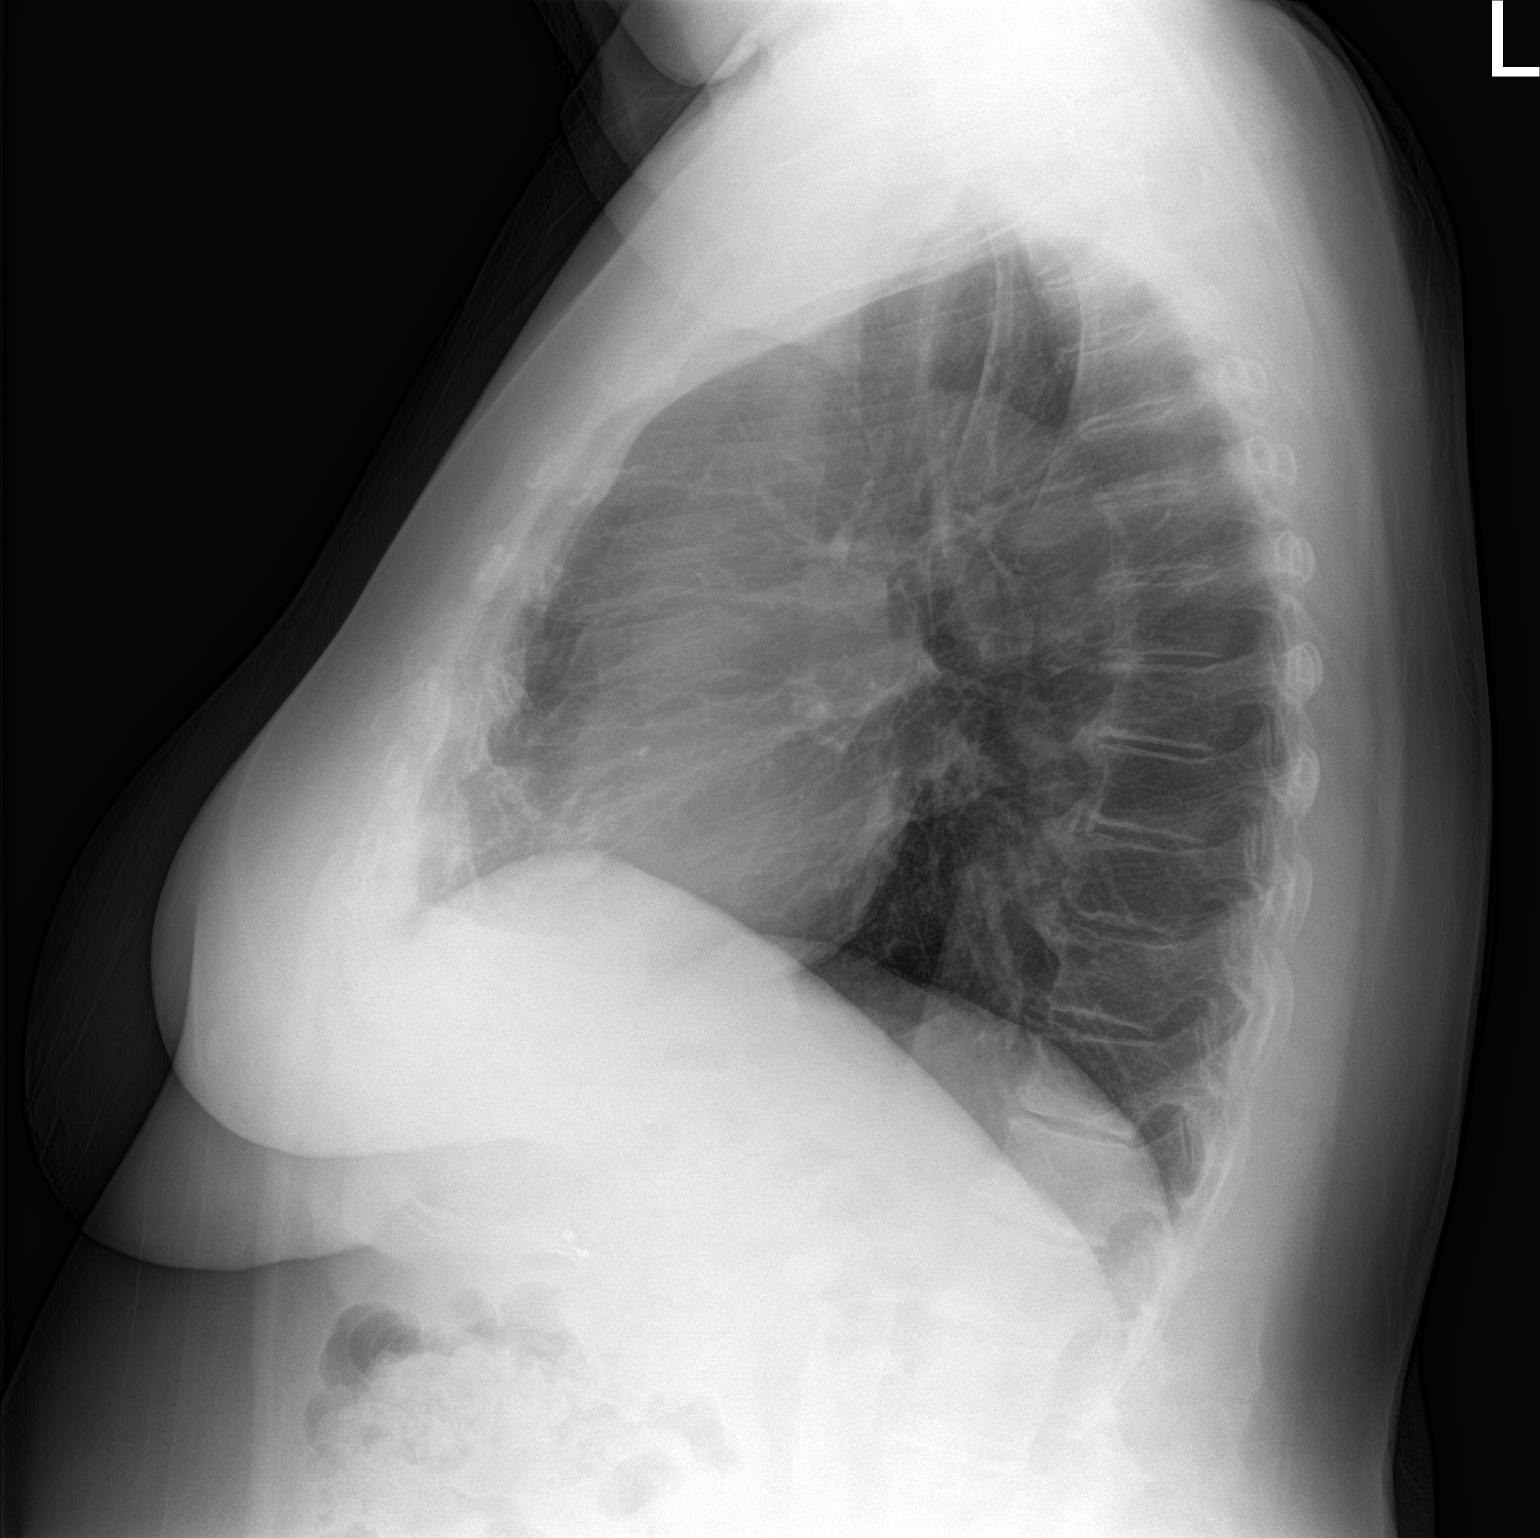

[2 of 2 positions shown; findings below may reference images not displayed]

FINDINGS: Heart size is normal. There is mild atherosclerotic calcification of
the aorta. Mediastinal shadows otherwise normal. The vascularity is
normal. Lungs are clear. Ordinary degenerative changes affect the
spine.
IMPRESSION: No active cardiopulmonary disease.

## 2019-05-26 ENCOUNTER — Encounter: Payer: Self-pay | Admitting: Family Medicine

## 2019-07-27 ENCOUNTER — Ambulatory Visit: Payer: Self-pay | Admitting: Podiatry

## 2019-08-16 DIAGNOSIS — G5601 Carpal tunnel syndrome, right upper limb: Secondary | ICD-10-CM | POA: Diagnosis not present

## 2019-08-16 DIAGNOSIS — Z4789 Encounter for other orthopedic aftercare: Secondary | ICD-10-CM | POA: Insufficient documentation

## 2019-08-30 DIAGNOSIS — M25641 Stiffness of right hand, not elsewhere classified: Secondary | ICD-10-CM | POA: Insufficient documentation

## 2019-09-13 ENCOUNTER — Other Ambulatory Visit: Payer: Self-pay

## 2019-09-13 ENCOUNTER — Ambulatory Visit (INDEPENDENT_AMBULATORY_CARE_PROVIDER_SITE_OTHER): Payer: Medicare Other | Admitting: Podiatry

## 2019-09-13 ENCOUNTER — Encounter: Payer: Self-pay | Admitting: Podiatry

## 2019-09-13 VITALS — BP 153/72 | HR 60 | Temp 96.3°F | Resp 16

## 2019-09-13 DIAGNOSIS — E1165 Type 2 diabetes mellitus with hyperglycemia: Secondary | ICD-10-CM

## 2019-09-13 DIAGNOSIS — E119 Type 2 diabetes mellitus without complications: Secondary | ICD-10-CM | POA: Diagnosis not present

## 2019-09-13 DIAGNOSIS — M79674 Pain in right toe(s): Secondary | ICD-10-CM | POA: Diagnosis not present

## 2019-09-13 DIAGNOSIS — E1151 Type 2 diabetes mellitus with diabetic peripheral angiopathy without gangrene: Secondary | ICD-10-CM | POA: Diagnosis not present

## 2019-09-13 DIAGNOSIS — B351 Tinea unguium: Secondary | ICD-10-CM | POA: Diagnosis not present

## 2019-09-13 DIAGNOSIS — M79675 Pain in left toe(s): Secondary | ICD-10-CM | POA: Diagnosis not present

## 2019-09-13 DIAGNOSIS — IMO0002 Reserved for concepts with insufficient information to code with codable children: Secondary | ICD-10-CM

## 2019-09-13 DIAGNOSIS — M2041 Other hammer toe(s) (acquired), right foot: Secondary | ICD-10-CM | POA: Diagnosis not present

## 2019-09-13 DIAGNOSIS — L600 Ingrowing nail: Secondary | ICD-10-CM

## 2019-09-13 DIAGNOSIS — M2042 Other hammer toe(s) (acquired), left foot: Secondary | ICD-10-CM

## 2019-09-13 NOTE — Patient Instructions (Addendum)
Diabetes Mellitus and Foot Care Foot care is an important part of your health, especially when you have diabetes. Diabetes may cause you to have problems because of poor blood flow (circulation) to your feet and legs, which can cause your skin to:  Become thinner and drier.  Break more easily.  Heal more slowly.  Peel and crack. You may also have nerve damage (neuropathy) in your legs and feet, causing decreased feeling in them. This means that you may not notice minor injuries to your feet that could lead to more serious problems. Noticing and addressing any potential problems early is the best way to prevent future foot problems. How to care for your feet Foot hygiene  Wash your feet daily with warm water and mild soap. Do not use hot water. Then, pat your feet and the areas between your toes until they are completely dry. Do not soak your feet as this can dry your skin.  Trim your toenails straight across. Do not dig under them or around the cuticle. File the edges of your nails with an emery board or nail file.  Apply a moisturizing lotion or petroleum jelly to the skin on your feet and to dry, brittle toenails. Use lotion that does not contain alcohol and is unscented. Do not apply lotion between your toes. Shoes and socks  Wear clean socks or stockings every day. Make sure they are not too tight. Do not wear knee-high stockings since they may decrease blood flow to your legs.  Wear shoes that fit properly and have enough cushioning. Always look in your shoes before you put them on to be sure there are no objects inside.  To break in new shoes, wear them for just a few hours a day. This prevents injuries on your feet. Wounds, scrapes, corns, and calluses  Check your feet daily for blisters, cuts, bruises, sores, and redness. If you cannot see the bottom of your feet, use a mirror or ask someone for help.  Do not cut corns or calluses or try to remove them with medicine.  If you  find a minor scrape, cut, or break in the skin on your feet, keep it and the skin around it clean and dry. You may clean these areas with mild soap and water. Do not clean the area with peroxide, alcohol, or iodine.  If you have a wound, scrape, corn, or callus on your foot, look at it several times a day to make sure it is healing and not infected. Check for: ? Redness, swelling, or pain. ? Fluid or blood. ? Warmth. ? Pus or a bad smell. General instructions  Do not cross your legs. This may decrease blood flow to your feet.  Do not use heating pads or hot water bottles on your feet. They may burn your skin. If you have lost feeling in your feet or legs, you may not know this is happening until it is too late.  Protect your feet from hot and cold by wearing shoes, such as at the beach or on hot pavement.  Schedule a complete foot exam at least once a year (annually) or more often if you have foot problems. If you have foot problems, report any cuts, sores, or bruises to your health care provider immediately. Contact a health care provider if:  You have a medical condition that increases your risk of infection and you have any cuts, sores, or bruises on your feet.  You have an injury that is not   healing.  You have redness on your legs or feet.  You feel burning or tingling in your legs or feet.  You have pain or cramps in your legs and feet.  Your legs or feet are numb.  Your feet always feel cold.  You have pain around a toenail. Get help right away if:  You have a wound, scrape, corn, or callus on your foot and: ? You have pain, swelling, or redness that gets worse. ? You have fluid or blood coming from the wound, scrape, corn, or callus. ? Your wound, scrape, corn, or callus feels warm to the touch. ? You have pus or a bad smell coming from the wound, scrape, corn, or callus. ? You have a fever. ? You have a red line going up your leg. Summary  Check your feet every day  for cuts, sores, red spots, swelling, and blisters.  Moisturize feet and legs daily.  Wear shoes that fit properly and have enough cushioning.  If you have foot problems, report any cuts, sores, or bruises to your health care provider immediately.  Schedule a complete foot exam at least once a year (annually) or more often if you have foot problems. This information is not intended to replace advice given to you by your health care provider. Make sure you discuss any questions you have with your health care provider. Document Revised: 03/23/2019 Document Reviewed: 08/01/2016 Elsevier Patient Education  [place] An ingrown toenail occurs when the corner or sides of a toenail grow into the surrounding skin. This causes discomfort and pain. The big toe is most commonly affected, but any of the toes can be affected. If an ingrown toenail is not treated, it can become infected. What are the causes? This condition may be caused by: Wearing shoes that are too small or tight. An injury, such as stubbing your toe or having your toe stepped on. Improper cutting or care of your toenails. Having nail or foot abnormalities that were present from birth (congenital abnormalities), such as having a nail that is too big for your toe. What increases the risk? The following factors may make you more likely to develop ingrown toenails: Age. Nails tend to get thicker with age, so ingrown nails are more common among older people. Cutting your toenails incorrectly, such as cutting them very short or cutting them unevenly. An ingrown toenail is more likely to get infected if you have: Diabetes. Blood flow (circulation) problems. What are the signs or symptoms? Symptoms of an ingrown toenail may include: Pain, soreness, or tenderness. Redness. Swelling. Hardening of the skin that surrounds the toenail. Signs that an ingrown toenail may be infected include: Fluid or  pus. Symptoms that get worse instead of better. How is this diagnosed? An ingrown toenail may be diagnosed based on your medical history, your symptoms, and a physical exam. If you have fluid or blood coming from your toenail, a sample may be collected to test for the specific type of bacteria that is causing the infection. How is this treated? Treatment depends on how severe your ingrown toenail is. You may be able to care for your toenail at home. If you have an infection, you may be prescribed antibiotic medicines. If you have fluid or pus draining from your toenail, your health care provider may drain it. If you have trouble walking, you may be given crutches to use. If you have a severe or infected ingrown toenail, you may need a  procedure to remove part or all of the nail. Follow these instructions at home: Foot care  Do not pick at your toenail or try to remove it yourself. Soak your foot in warm, soapy water. Do this for 20 minutes, 3 times a day, or as often as told by your health care provider. This helps to keep your toe clean and keep your skin soft. Wear shoes that fit well and are not too tight. Your health care provider may recommend that you wear open-toed shoes while you heal. Trim your toenails regularly and carefully. Cut your toenails straight across to prevent injury to the skin at the corners of the toenail. Do not cut your nails in a curved shape. Keep your feet clean and dry to help prevent infection. Medicines Take over-the-counter and prescription medicines only as told by your health care provider. If you were prescribed an antibiotic, take it as told by your health care provider. Do not stop taking the antibiotic even if you start to feel better. Activity Return to your normal activities as told by your health care provider. Ask your health care provider what activities are safe for you. Avoid activities that cause pain. General instructions If your health care  provider told you to use crutches to help you move around, use them as instructed. Keep all follow-up visits as told by your health care provider. This is important. Contact a health care provider if: You have more redness, swelling, pain, or other symptoms that do not improve with treatment. You have fluid, blood, or pus coming from your toenail. Get help right away if: You have a red streak on your skin that starts at your foot and spreads up your leg. You have a fever. Summary An ingrown toenail occurs when the corner or sides of a toenail grow into the surrounding skin. This causes discomfort and pain. The big toe is most commonly affected, but any of the toes can be affected. If an ingrown toenail is not treated, it can become infected. Fluid or pus draining from your toenail is a sign of infection. Your health care provider may need to drain it. You may be given antibiotics to treat the infection. Trimming your toenails regularly and properly can help you prevent an ingrown toenail. This information is not intended to replace advice given to you by your health care provider. Make sure you discuss any questions you have with your health care provider. Document Revised: 10/22/2018 Document Reviewed: 03/18/2017 Elsevier Patient Education  [place].

## 2019-09-18 NOTE — Progress Notes (Signed)
Subjective: Bethany Miller presents today referred by Carollee Herter, Alferd Apa, DO for diabetic foot evaluation.  Patient relates 10 year history of diabetes.  Patient denies any history of foot wounds.  Patient denies any history of numbness, tingling, burning, pins/needles sensations.  Today, patient c/o of painful, discolored, thick toenails which interfere with daily activities.  Pain is aggravated when wearing enclosed shoe gear. She relates history of ingrown toenails b/l great toes.  Past Medical History:  Diagnosis Date  . Diabetes mellitus   . Hyperlipidemia   . Hypertension   . Murmur     Patient Active Problem List   Diagnosis Date Noted  . Stiffness of finger joint of right hand 08/30/2019  . Encounter for orthopedic follow-up care 08/16/2019  . Carpal tunnel syndrome of right wrist 05/11/2019  . Pain in right hand 05/10/2019  . Atypical chest pain 03/03/2018  . Primary osteoarthritis of right knee 02/26/2017  . Right knee DJD 02/26/2017  . Pre-operative clearance 01/09/2017  . Bronchitis 11/11/2016  . Obesity (BMI 30-39.9) 12/23/2013  . Sinusitis 09/05/2013  . UTI (lower urinary tract infection) 03/05/2012  . ANXIETY 09/26/2009  . SINUSITIS 09/26/2009  . OTHER ACUTE REACTIONS TO STRESS 09/03/2009  . Depression with anxiety 06/18/2009  . Hyperlipidemia LDL goal <70 04/16/2009  . DM (diabetes mellitus) type II uncontrolled, periph vascular disorder ([place]) 01/25/2009  . MYALGIA 01/25/2009  . HYPERGLYCEMIA, FASTING 05/08/2008  . ESSENTIAL HYPERTENSION, BENIGN 01/17/2008  . CHEST PAIN 01/17/2008  . INSOMNIA 12/31/2007  . MURMUR 12/31/2007    Past Surgical History:  Procedure Laterality Date  . ABDOMINAL HYSTERECTOMY    . BACK SURGERY    . CHOLECYSTECTOMY    . TONSILLECTOMY    . TOTAL KNEE ARTHROPLASTY    . TOTAL KNEE ARTHROPLASTY Right 02/26/2017   Procedure: RIGHT TOTAL KNEE ARTHROPLASTY;  Surgeon: Susa Day, MD;  Location: [facility];  Service:  Orthopedics;  Laterality: Right;    Current Outpatient Medications on File Prior to Visit  Medication Sig Dispense Refill  . acetaminophen (TYLENOL) 650 MG CR tablet Take 650 mg by mouth every 8 (eight) hours as needed for pain.    Marland Kitchen aspirin EC 81 MG tablet Take 81 mg by mouth daily.    Marland Kitchen atorvastatin (LIPITOR) 40 MG tablet Take 1 tablet (40 mg total) by mouth daily. 90 tablet 1  . citalopram (CELEXA) 40 MG tablet 1 po qd 90 tablet 3  . docusate sodium (COLACE) 100 MG capsule Take 1 capsule (100 mg total) by mouth 2 (two) times daily as needed for mild constipation. 30 capsule 1  . glipiZIDE (GLUCOTROL) 10 MG tablet TAKE 1 TABLET TWICE DAILY BEFORE MEALS. STOP JARDIANCE AND GLIPIZIDE 5MG  180 tablet 1  . glucose blood (ACCU-CHEK GUIDE) test strip USE 1 STRIP TO CHECK GLUCOSE ONCE DAILY 100 each 4  . ibuprofen (ADVIL,MOTRIN) 800 MG tablet Take 800 mg by mouth 3 (three) times daily as needed for moderate pain.    . Insulin Admin Supplies MISC Use to inject insulin 2 times a day. 180 each 0  . Insulin Glargine (LANTUS SOLOSTAR) 100 UNIT/ML Solostar Pen Inject 12 Units into the skin at bedtime. 5 pen 11  . Insulin Pen Needle 32G X 4 MM MISC Use 1x a day 100 each 3  . Lancets (ACCU-CHEK MULTICLIX) lancets Use as instructed to test once a day DX E11.51 100 each 1  . lisinopril-hydrochlorothiazide (ZESTORETIC) 10-12.5 MG tablet Take 1 tablet by mouth daily. 90 tablet 1  .  Loratadine-Pseudoephedrine (CLARITIN-D 12 HOUR PO) Take 1 tablet by mouth daily as needed (sinus).     . Melatonin 3 MG TABS Take 3 mg by mouth at bedtime as needed (sleep).    . metFORMIN (GLUCOPHAGE-XR) 500 MG 24 hr tablet Take 2 tablets (1,000 mg total) by mouth 2 (two) times daily after a meal. 360 tablet 3  . Multiple Vitamins-Minerals (MULTIVITAMIN ADULT PO) Take 1 tablet by mouth daily.     Marland Kitchen omeprazole (PRILOSEC) 20 MG capsule Take 1 capsule (20 mg total) by mouth daily. 90 capsule 3   No current facility-administered  medications on file prior to visit.     Allergies  Allergen Reactions  . Ozempic (0.25 Or 0.5 Mg-Dose) [Semaglutide(0.25 Or 0.5mg -Dos)] Diarrhea and Nausea And Vomiting    Pt stated, "I got dehydrated from this as well"    Social History   Occupational History  . Not on file  Tobacco Use  . Smoking status: Never Smoker  . Smokeless tobacco: Never Used  Substance and Sexual Activity  . Alcohol use: Yes    Comment: rare  . Drug use: No  . Sexual activity: Not Currently    Partners: Male    Family History  Problem Relation Age of Onset  . Throat cancer Brother   . Heart disease Brother        MI  . Cancer Brother 80       throat,   . Hyperlipidemia Brother   . Cirrhosis Father   . Alcohol abuse Father   . Heart disease Brother   . Cancer Brother   . Arthritis Mother   . Hypertension Sister   . Arthritis Sister   . Alzheimer's disease Sister   . Cancer Brother   . Cancer Brother 23       melanoma  . Hypertension Brother   . Hyperlipidemia Brother   . Melanoma Other   . Arthritis Other     Immunization History  Administered Date(s) Administered  . Fluad Quad(high Dose 65+) 04/01/2019  . Influenza Split 05/20/2011, 03/24/2012  . Influenza Whole 04/16/2009, 04/30/2010  . Influenza, High Dose Seasonal PF 03/28/2016, 04/29/2017, 03/31/2018  . Influenza,inj,Quad PF,6+ Mos 04/08/2013  . Pneumococcal Conjugate-13 03/28/2016  . Pneumococcal Polysaccharide-23 04/01/2019  . Td 11/26/2009  . Zoster 01/02/2010    Review of systems: Positive Findings in bold print.  Constitutional:  chills, fatigue, fever, sweats, weight change Communication: Optometrist, sign Ecologist, hand writing, iPad/Android device Head: headaches, head injury Eyes: changes in vision, eye pain, glaucoma, cataracts, macular degeneration, diplopia, glare,  light sensitivity, eyeglasses or contacts, blindness Ears nose mouth throat: hearing impaired, hearing aids,  ringing in ears, deaf,  sign language,  vertigo, nosebleeds,  rhinitis,  cold sores, snoring, swollen glands Cardiovascular: HTN, edema, arrhythmia, pacemaker in place, defibrillator in place, chest pain/tightness, chronic anticoagulation, blood clot, heart failure, MI Peripheral Vascular: leg cramps, varicose veins, blood clots, lymphedema, varicosities Respiratory:  difficulty breathing, denies congestion, SOB, wheezing, cough, emphysema Gastrointestinal: change in appetite or weight, abdominal pain, constipation, diarrhea, nausea, vomiting, vomiting blood, change in bowel habits, abdominal pain, jaundice, rectal bleeding, hemorrhoids, GERD Genitourinary:  nocturia,  pain on urination, polyuria,  blood in urine, Foley catheter, urinary urgency, ESRD on hemodialysis Musculoskeletal: amputation, cramping, stiff joints, painful joints, decreased joint motion, fractures, OA, gout, hemiplegia, paraplegia, uses cane, wheelchair bound, uses walker, uses rollator Skin: +changes in toenails, color change, dryness, itching, mole changes,  rash, wound(s) Neurological: headaches, numbness in feet, paresthesias in feet,  burning in feet, fainting,  seizures, change in speech,  headaches, memory problems/poor historian, cerebral palsy, weakness, paralysis, CVA, TIA Endocrine: diabetes, hypothyroidism, hyperthyroidism,  goiter, dry mouth, flushing, heat intolerance,  cold intolerance,  excessive thirst, denies polyuria,  nocturia Hematological:  easy bleeding, excessive bleeding, easy bruising, enlarged lymph nodes, on long term blood thinner, history of past transusions Allergy/immunological:  hives, eczema, frequent infections, multiple drug allergies, seasonal allergies, transplant recipient, multiple food allergies Psychiatric:  anxiety, depression, mood disorder, suicidal ideations, hallucinations, insomnia  Objective: Vitals:   09/13/19 1026  BP: (!) 153/72  Pulse: 60  Resp: 16  Temp: (!) 96.3 F (35.7 C)   Vascular  Examination: Capillary refill time to digits immediate b/l. Palpable DP pulses b/l. Palpable PT pulses b/l. Pedal hair sparse b/l. Skin temperature gradient within normal limits b/l.  Dermatological Examination: Pedal skin with normal turgor, texture and tone bilaterally. No open wounds bilaterally. No interdigital macerations bilaterally. Toenails 1-5 b/l elongated, dystrophic, thickened, crumbly with subungual debris and tenderness to dorsal palpation.   Incurvated nailplate b/l great toes with tenderness to palpation. No erythema, no edema, no drainage noted.  Musculoskeletal Examination: Normal muscle strength 5/5 to all lower extremity muscle groups bilaterally, no pain crepitus or joint limitation noted with ROM b/l and hammertoes noted to the  L 5th toe and R 5th toe.  Neurological Examination: Protective sensation intact 5/5 intact bilaterally with 10g monofilament b/l Vibratory sensation intact b/l Proprioception intact bilaterally  1. Pain due to onychomycosis of toenails of both feet   2. Ingrown toenail without infection   3. Acquired hammertoes of both feet   4. DM (diabetes mellitus) type II uncontrolled, periph vascular disorder ([place])   5. Encounter for diabetic foot exam ([place])     Plan: -Diabetic foot examination performed on today's visit. -Discussed diabetic foot care principles. Literature dispensed on today. -Toenails 1-5 b/l were debrided in length and girth with sterile nail nippers and dremel without iatrogenic bleeding. Offending nail borders debrided and curretaged b/l great toes. Borders cleansed with alcohol. Antibiotic ointment applied. No further treatment required by patient. -Patient to continue soft, supportive shoe gear daily. Start procedure for diabetic shoes. Patient qualifies based on diagnoses. -Patient to report any pedal injuries to medical professional immediately. -Patient/POA to call should there be question/concern in the interim.

## 2019-09-29 ENCOUNTER — Ambulatory Visit: Payer: Medicare Other | Admitting: Family Medicine

## 2019-09-30 ENCOUNTER — Ambulatory Visit: Payer: Medicare Other | Admitting: Family Medicine

## 2019-10-05 ENCOUNTER — Other Ambulatory Visit: Payer: Self-pay

## 2019-10-06 ENCOUNTER — Encounter: Payer: Self-pay | Admitting: Family Medicine

## 2019-10-06 ENCOUNTER — Other Ambulatory Visit: Payer: Self-pay

## 2019-10-06 ENCOUNTER — Ambulatory Visit (INDEPENDENT_AMBULATORY_CARE_PROVIDER_SITE_OTHER): Payer: Medicare Other | Admitting: Family Medicine

## 2019-10-06 VITALS — BP 132/68 | HR 61 | Temp 97.4°F | Resp 18 | Ht 62.0 in | Wt 216.0 lb

## 2019-10-06 DIAGNOSIS — I10 Essential (primary) hypertension: Secondary | ICD-10-CM | POA: Diagnosis not present

## 2019-10-06 DIAGNOSIS — E1151 Type 2 diabetes mellitus with diabetic peripheral angiopathy without gangrene: Secondary | ICD-10-CM

## 2019-10-06 DIAGNOSIS — G4700 Insomnia, unspecified: Secondary | ICD-10-CM | POA: Diagnosis not present

## 2019-10-06 DIAGNOSIS — IMO0002 Reserved for concepts with insufficient information to code with codable children: Secondary | ICD-10-CM

## 2019-10-06 DIAGNOSIS — E1165 Type 2 diabetes mellitus with hyperglycemia: Secondary | ICD-10-CM | POA: Diagnosis not present

## 2019-10-06 DIAGNOSIS — E785 Hyperlipidemia, unspecified: Secondary | ICD-10-CM | POA: Diagnosis not present

## 2019-10-06 LAB — COMPREHENSIVE METABOLIC PANEL
ALT: 11 U/L (ref 0–35)
AST: 12 U/L (ref 0–37)
Albumin: 4 g/dL (ref 3.5–5.2)
Alkaline Phosphatase: 113 U/L (ref 39–117)
BUN: 28 mg/dL — ABNORMAL HIGH (ref 6–23)
CO2: 25 mEq/L (ref 19–32)
Calcium: 8.9 mg/dL (ref 8.4–10.5)
Chloride: 103 mEq/L (ref 96–112)
Creatinine, Ser: 1.22 mg/dL — ABNORMAL HIGH (ref 0.40–1.20)
GFR: 43.31 mL/min — ABNORMAL LOW (ref >60.00)
Glucose, Bld: 212 mg/dL — ABNORMAL HIGH (ref 70–99)
Potassium: 4.6 mEq/L (ref 3.5–5.1)
Sodium: 134 mEq/L — ABNORMAL LOW (ref 135–145)
Total Bilirubin: 0.6 mg/dL (ref 0.2–1.2)
Total Protein: 6.5 g/dL (ref 6.0–8.3)

## 2019-10-06 LAB — LIPID PANEL
Cholesterol: 200 mg/dL (ref 0–200)
HDL: 39.7 mg/dL (ref >39.00)
LDL Cholesterol: 126 mg/dL — ABNORMAL HIGH (ref 0–99)
NonHDL: 160.17
Total CHOL/HDL Ratio: 5
Triglycerides: 169 mg/dL — ABNORMAL HIGH (ref 0.0–149.0)
VLDL: 33.8 mg/dL (ref 0.0–40.0)

## 2019-10-06 LAB — HEMOGLOBIN A1C: Hgb A1c MFr Bld: 9.2 % — ABNORMAL HIGH (ref 4.6–6.5)

## 2019-10-06 MED ORDER — ATORVASTATIN CALCIUM 40 MG PO TABS: 40.0000 mg | ORAL_TABLET | Freq: Every day | ORAL | 1 refills | Status: DC

## 2019-10-06 MED ORDER — TRAZODONE HCL 50 MG PO TABS: 25.0000 mg | ORAL_TABLET | Freq: Every evening | ORAL | 3 refills | Status: DC | PRN

## 2019-10-06 NOTE — Assessment & Plan Note (Signed)
Well controlled, no changes to meds. Encouraged heart healthy diet such as the DASH diet and exercise as tolerated.  °

## 2019-10-06 NOTE — Progress Notes (Signed)
Patient ID: Bethany Miller, female    DOB: September 06, 1945  Age: 73 y.o. MRN: [id number]    Subjective:  Subjective  HPI Danika Kluender presents for f/u dm, chol and htn.    HPI HYPERTENSION   Blood pressure range-stable per pt   Chest pain- no      Dyspnea- no Lightheadedness- no   Edema- no  Other side effects - no   Medication compliance: good Low salt diet- yes     DIABETES    Blood Sugar ranges-good per pt   Polyuria- no New Visual problems- no  Hypoglycemic symptoms- no  Other side effects-no Medication compliance - good Last eye exam- due Foot exam- today   HYPERLIPIDEMIA  Medication compliance- good RUQ pain- no  Muscle aches- no Other side effects-no      Review of Systems  Constitutional: Negative for appetite change, diaphoresis, fatigue and unexpected weight change.  Eyes: Negative for pain, redness and visual disturbance.  Respiratory: Negative for cough, chest tightness, shortness of breath and wheezing.   Cardiovascular: Negative for chest pain, palpitations and leg swelling.  Endocrine: Negative for cold intolerance, heat intolerance, polydipsia, polyphagia and polyuria.  Genitourinary: Negative for difficulty urinating, dysuria and frequency.  Neurological: Negative for dizziness, light-headedness, numbness and headaches.    History Past Medical History:  Diagnosis Date  . Diabetes mellitus   . Hyperlipidemia   . Hypertension   . 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   Her family history includes Alcohol abuse in her father; Alzheimer's disease in her sister; Arthritis in her mother, sister, and another family member; Cancer in her brother and brother; Cancer (age of onset: 75) in her brother; Cancer (age of onset: 57) in her brother; Cirrhosis in her father; Heart disease in her brother  and brother; Hyperlipidemia in her brother and brother; Hypertension in her brother and sister; Melanoma in an other family member; Throat cancer in her brother.She reports that she has never smoked. She has never used smokeless tobacco. She reports current alcohol use. She reports that she does not use drugs.  Current Outpatient Medications on File Prior to Visit  Medication Sig Dispense Refill  . acetaminophen (TYLENOL) 650 MG CR tablet Take 650 mg by mouth every 8 (eight) hours as needed for pain.    Marland Kitchen ALPRAZolam (XANAX) 1 MG tablet     . aspirin EC 81 MG tablet Take 81 mg by mouth daily.    . citalopram (CELEXA) 40 MG tablet 1 po qd 90 tablet 3  . docusate sodium (COLACE) 100 MG capsule Take 1 capsule (100 mg total) by mouth 2 (two) times daily as needed for mild constipation. 30 capsule 1  . glipiZIDE (GLUCOTROL) 10 MG tablet TAKE 1 TABLET TWICE DAILY BEFORE MEALS. STOP JARDIANCE AND GLIPIZIDE 5MG  180 tablet 1  . glucose blood (ACCU-CHEK GUIDE) test strip USE 1 STRIP TO CHECK GLUCOSE ONCE DAILY 100 each 4  . ibuprofen (ADVIL,MOTRIN) 800 MG tablet Take 800 mg by mouth 3 (three) times daily as needed for moderate pain.    . Insulin Admin Supplies MISC Use to inject insulin 2 times a day. 180 each 0  . Insulin Glargine (LANTUS SOLOSTAR) 100 UNIT/ML Solostar Pen Inject 12 Units into the skin at bedtime. 5 pen 11  . Insulin Pen Needle 32G X 4 MM MISC Use 1x a day 100 each  3  . Lancets (ACCU-CHEK MULTICLIX) lancets Use as instructed to test once a day DX E11.51 100 each 1  . lisinopril-hydrochlorothiazide (ZESTORETIC) 10-12.5 MG tablet Take 1 tablet by mouth daily. 90 tablet 1  . Loratadine-Pseudoephedrine (CLARITIN-D 12 HOUR PO) Take 1 tablet by mouth daily as needed (sinus).     . Melatonin 3 MG TABS Take 3 mg by mouth at bedtime as needed (sleep).    . metFORMIN (GLUCOPHAGE-XR) 500 MG 24 hr tablet Take 2 tablets (1,000 mg total) by mouth 2 (two) times daily after a meal. 360 tablet 3  .  Multiple Vitamins-Minerals (MULTIVITAMIN ADULT PO) Take 1 tablet by mouth daily.     Marland Kitchen omeprazole (PRILOSEC) 20 MG capsule Take 1 capsule (20 mg total) by mouth daily. 90 capsule 3   No current facility-administered medications on file prior to visit.     Objective:  Objective  Physical Exam Vitals and nursing note reviewed.  Constitutional:      Appearance: She is well-developed.  HENT:     Head: Normocephalic and atraumatic.  Eyes:     Conjunctiva/sclera: Conjunctivae normal.  Neck:     Thyroid: No thyromegaly.     Vascular: No carotid bruit or JVD.  Cardiovascular:     Rate and Rhythm: Normal rate and regular rhythm.     Heart sounds: Normal heart sounds. No murmur.  Pulmonary:     Effort: Pulmonary effort is normal. No respiratory distress.     Breath sounds: Normal breath sounds. No wheezing or rales.  Chest:     Chest wall: No tenderness.  Musculoskeletal:     Cervical back: Normal range of motion and neck supple.  Neurological:     Mental Status: She is alert and oriented to person, place, and time.    Diabetic Foot Exam - Simple   Simple Foot Form Diabetic Foot exam was performed with the following findings: Yes 10/06/2019 10:56 AM  Visual Inspection No deformities, no ulcerations, no other skin breakdown bilaterally: Yes Sensation Testing Intact to touch and monofilament testing bilaterally: Yes Pulse Check Posterior Tibialis and Dorsalis pulse intact bilaterally: Yes Comments     BP 132/68 (BP Location: Right Arm, Patient Position: Sitting, Cuff Size: Large)   Pulse 61   Temp (!) 97.4 F (36.3 C) (Temporal)   Resp 18   Ht 5\' 2"  (1.575 m)   Wt 216 lb (98 kg)   SpO2 94%   BMI 39.51 kg/m  Wt Readings from Last 3 Encounters:  10/06/19 216 lb (98 kg)  04/01/19 209 lb 6.4 oz (95 kg)  11/25/18 198 lb 4 oz (89.9 kg)     Lab Results  Component Value Date   WBC 10.3 11/25/2018   HGB 13.8 11/25/2018   HCT 41.9 11/25/2018   PLT 360 11/25/2018    GLUCOSE 138 (H) 04/01/2019   CHOL 159 04/01/2019   TRIG 113.0 04/01/2019   HDL 38.80 (L) 04/01/2019   LDLDIRECT 149.6 01/26/2013   LDLCALC 98 04/01/2019   ALT 13 04/01/2019   AST 14 04/01/2019   NA 137 04/01/2019   K 4.7 04/01/2019   CL 105 04/01/2019   CREATININE 1.12 04/01/2019   BUN 21 04/01/2019   CO2 25 04/01/2019   TSH 3.82 06/10/2017   INR 0.96 02/17/2017   HGBA1C 8.6 (H) 04/01/2019   MICROALBUR 0.6 03/28/2016    No results found.   Assessment & Plan:  Plan  I am having Grace L. Doi start on traZODone. I am  also having her maintain her Multiple Vitamins-Minerals (MULTIVITAMIN ADULT PO), Loratadine-Pseudoephedrine (CLARITIN-D 12 HOUR PO), ibuprofen, melatonin, acetaminophen, docusate sodium, aspirin EC, accu-chek multiclix, glucose blood, insulin glargine, Insulin Pen Needle, Insulin Admin Supplies, metFORMIN, glipiZIDE, omeprazole, lisinopril-hydrochlorothiazide, citalopram, ALPRAZolam, and atorvastatin.  Meds ordered this encounter  Medications  . atorvastatin (LIPITOR) 40 MG tablet    Sig: Take 1 tablet (40 mg total) by mouth daily.    Dispense:  90 tablet    Refill:  1  . traZODone (DESYREL) 50 MG tablet    Sig: Take 0.5-1 tablets (25-50 mg total) by mouth at bedtime as needed for sleep.    Dispense:  30 tablet    Refill:  3    Problem List Items Addressed This Visit      Unprioritized   DM (diabetes mellitus) type II uncontrolled, periph vascular disorder ([place])    hgba1c to be checked , minimize simple carbs. Increase exercise as tolerated. Continue current meds       Relevant Medications   atorvastatin (LIPITOR) 40 MG tablet   ESSENTIAL HYPERTENSION, BENIGN    Well controlled, no changes to meds. Encouraged heart healthy diet such as the DASH diet and exercise as tolerated.       Relevant Medications   atorvastatin (LIPITOR) 40 MG tablet   Hyperlipidemia LDL goal <70    Tolerating statin, encouraged heart healthy diet, avoid trans fats, minimize  simple carbs and saturated fats. Increase exercise as tolerated      Relevant Medications   atorvastatin (LIPITOR) 40 MG tablet   INSOMNIA   Relevant Medications   traZODone (DESYREL) 50 MG tablet    Other Visit Diagnoses    Essential hypertension    -  Primary   Relevant Medications   atorvastatin (LIPITOR) 40 MG tablet   Other Relevant Orders   Lipid panel   Comprehensive metabolic panel   Type 2 diabetes mellitus with hyperglycemia, without long-term current use of insulin (HCC)       Relevant Medications   atorvastatin (LIPITOR) 40 MG tablet   Other Relevant Orders   Hemoglobin A1c      Follow-up: Return in about 6 months (around 04/07/2020), or if symptoms worsen or fail to improve, for hypertension, hyperlipidemia, diabetes II.  Ann Held, DO

## 2019-10-06 NOTE — Assessment & Plan Note (Signed)
Tolerating statin, encouraged heart healthy diet, avoid trans fats, minimize simple carbs and saturated fats. Increase exercise as tolerated 

## 2019-10-06 NOTE — Patient Instructions (Signed)
Carbohydrate Counting for Diabetes Mellitus, Adult  Carbohydrate counting is a method of keeping track of how many carbohydrates you eat. Eating carbohydrates naturally increases the amount of sugar (glucose) in the blood. Counting how many carbohydrates you eat helps keep your blood glucose within normal limits, which helps you manage your diabetes (diabetes mellitus). It is important to know how many carbohydrates you can safely have in each meal. This is different for every person. A diet and nutrition specialist (registered dietitian) can help you make a meal plan and calculate how many carbohydrates you should have at each meal and snack. Carbohydrates are found in the following foods:  Grains, such as breads and cereals.  Dried beans and soy products.  Starchy vegetables, such as potatoes, peas, and corn.  Fruit and fruit juices.  Milk and yogurt.  Sweets and snack foods, such as cake, cookies, candy, chips, and soft drinks. How do I count carbohydrates? There are two ways to count carbohydrates in food. You can use either of the methods or a combination of both. Reading "Nutrition Facts" on packaged food The "Nutrition Facts" list is included on the labels of almost all packaged foods and beverages in the [place] It includes:  The serving size.  Information about nutrients in each serving, including the grams (g) of carbohydrate per serving. To use the "Nutrition Facts":  Decide how many servings you will have.  Multiply the number of servings by the number of carbohydrates per serving.  The resulting number is the total amount of carbohydrates that you will be having. Learning standard serving sizes of other foods When you eat carbohydrate foods that are not packaged or do not include "Nutrition Facts" on the label, you need to measure the servings in order to count the amount of carbohydrates:  Measure the foods that you will eat with a food scale or measuring cup, if  needed.  Decide how many standard-size servings you will eat.  Multiply the number of servings by 15. Most carbohydrate-rich foods have about 15 g of carbohydrates per serving. ? For example, if you eat 8 oz (170 g) of strawberries, you will have eaten 2 servings and 30 g of carbohydrates (2 servings x 15 g = 30 g).  For foods that have more than one food mixed, such as soups and casseroles, you must count the carbohydrates in each food that is included. The following list contains standard serving sizes of common carbohydrate-rich foods. Each of these servings has about 15 g of carbohydrates:   hamburger bun or  English muffin.   oz (15 mL) syrup.   oz (14 g) jelly.  1 slice of bread.  1 six-inch tortilla.  3 oz (85 g) cooked rice or pasta.  4 oz (113 g) cooked dried beans.  4 oz (113 g) starchy vegetable, such as peas, corn, or potatoes.  4 oz (113 g) hot cereal.  4 oz (113 g) mashed potatoes or  of a large baked potato.  4 oz (113 g) canned or frozen fruit.  4 oz (120 mL) fruit juice.  4-6 crackers.  6 chicken nuggets.  6 oz (170 g) unsweetened dry cereal.  6 oz (170 g) plain fat-free yogurt or yogurt sweetened with artificial sweeteners.  8 oz (240 mL) milk.  8 oz (170 g) fresh fruit or one small piece of fruit.  24 oz (680 g) popped popcorn. Example of carbohydrate counting Sample meal  3 oz (85 g) chicken breast.  6 oz (170 g)   brown rice.  4 oz (113 g) corn.  8 oz (240 mL) milk.  8 oz (170 g) strawberries with sugar-free whipped topping. Carbohydrate calculation 1. Identify the foods that contain carbohydrates: ? Rice. ? Corn. ? Milk. ? Strawberries. 2. Calculate how many servings you have of each food: ? 2 servings rice. ? 1 serving corn. ? 1 serving milk. ? 1 serving strawberries. 3. Multiply each number of servings by 15 g: ? 2 servings rice x 15 g = 30 g. ? 1 serving corn x 15 g = 15 g. ? 1 serving milk x 15 g = 15 g. ? 1  serving strawberries x 15 g = 15 g. 4. Add together all of the amounts to find the total grams of carbohydrates eaten: ? 30 g + 15 g + 15 g + 15 g = 75 g of carbohydrates total. Summary  Carbohydrate counting is a method of keeping track of how many carbohydrates you eat.  Eating carbohydrates naturally increases the amount of sugar (glucose) in the blood.  Counting how many carbohydrates you eat helps keep your blood glucose within normal limits, which helps you manage your diabetes.  A diet and nutrition specialist (registered dietitian) can help you make a meal plan and calculate how many carbohydrates you should have at each meal and snack. This information is not intended to replace advice given to you by your health care provider. Make sure you discuss any questions you have with your health care provider. Document Revised: 01/22/2017 Document Reviewed: 12/12/2015 Elsevier Patient Education  2020 Elsevier Inc.  

## 2019-10-06 NOTE — Assessment & Plan Note (Signed)
hgba1c to be checked, minimize simple carbs. Increase exercise as tolerated. Continue current meds  

## 2019-10-08 ENCOUNTER — Other Ambulatory Visit: Payer: Self-pay | Admitting: Family Medicine

## 2019-10-08 DIAGNOSIS — E1165 Type 2 diabetes mellitus with hyperglycemia: Secondary | ICD-10-CM

## 2019-10-08 DIAGNOSIS — E1169 Type 2 diabetes mellitus with other specified complication: Secondary | ICD-10-CM

## 2019-10-28 ENCOUNTER — Telehealth: Payer: Self-pay | Admitting: Family Medicine

## 2019-10-28 NOTE — Progress Notes (Signed)
  Chronic Care Management   Outreach Note  10/28/2019 Name: [name] MRN: [id number] DOB: [birth year]  Referred by: Ann Held, DO Reason for referral : No chief complaint on file.   An unsuccessful telephone outreach was attempted today. The patient was referred to the pharmacist for assistance with care management and care coordination.   Follow Up Plan:   Raynicia Dukes [employer] Scheduler

## 2019-10-31 ENCOUNTER — Other Ambulatory Visit: Payer: Self-pay | Admitting: Family Medicine

## 2019-10-31 ENCOUNTER — Telehealth: Payer: Self-pay | Admitting: Family Medicine

## 2019-10-31 ENCOUNTER — Telehealth: Payer: Self-pay

## 2019-10-31 DIAGNOSIS — G4700 Insomnia, unspecified: Secondary | ICD-10-CM

## 2019-10-31 MED ORDER — BELSOMRA 10 MG PO TABS: 1.0000 | ORAL_TABLET | Freq: Every evening | ORAL | 1 refills | Status: DC | PRN

## 2019-10-31 NOTE — Telephone Encounter (Signed)
Pt states she is willing to try new medication first. She states she doesn't want to the the sleep eval right now.

## 2019-10-31 NOTE — Chronic Care Management (AMB) (Signed)
  Chronic Care Management   Note  10/31/2019 Name: Bethany Miller MRN: [id number] DOB: 03/26/1946  Bethany Miller is a 74 y.o. year old female who is a primary care patient of Ann Held, DO. I reached out to [employer] by phone today in response to a referral sent by Bethany Miller's PCP, Carollee Herter, Alferd Apa, DO.   Bethany Miller was given information about Chronic Care Management services today including:  1. CCM service includes personalized support from designated clinical staff supervised by her physician, including individualized plan of care and coordination with other care providers 2. 24/7 contact phone numbers for assistance for urgent and routine care needs. 3. Service will only be billed when office clinical staff spend 20 minutes or more in a month to coordinate care. 4. Only one practitioner may furnish and bill the service in a calendar month. 5. The patient may stop CCM services at any time (effective at the end of the month) by phone call to the office staff.   Patient agreed to services and verbal consent obtained.   Follow up plan  Raynicia Dukes [employer] Scheduler

## 2019-10-31 NOTE — Telephone Encounter (Signed)
I sent in 10 mg ---  it may take a week to notice a difference---- if 10 mg does not work ----  can take 2

## 2019-10-31 NOTE — Telephone Encounter (Signed)
We could try belsomra ---   she may need sleep eval with neuro

## 2019-10-31 NOTE — Telephone Encounter (Signed)
Patient called in to let Dr. Etter Sjogren know that the current sleep medication she is on is not agreeing with her traZODone (DESYREL) 50 MG tablet [[id number]]   The patient would like for Dr. Etter Sjogren to give her a call because she cannot sleep at night at all the medication makes the patient sick. Please call the patient back at [phone]

## 2019-10-31 NOTE — Telephone Encounter (Signed)
Please advise 

## 2019-11-01 NOTE — Telephone Encounter (Signed)
Spoke with the patient. Pt states she is not planning to take new medication. Pt states medication was over $400.00. Pt states she will try to deal with insomnia on her own. FYI

## 2019-11-01 NOTE — Telephone Encounter (Signed)
Does pt snore------  sleep eval may help

## 2019-11-29 ENCOUNTER — Ambulatory Visit: Payer: Medicare Other | Admitting: Pharmacist

## 2019-11-29 DIAGNOSIS — I10 Essential (primary) hypertension: Secondary | ICD-10-CM

## 2019-11-29 DIAGNOSIS — IMO0002 Reserved for concepts with insufficient information to code with codable children: Secondary | ICD-10-CM

## 2019-11-29 DIAGNOSIS — E785 Hyperlipidemia, unspecified: Secondary | ICD-10-CM

## 2019-11-29 NOTE — Patient Instructions (Addendum)
Visit Information  Goals Addressed            This Visit's Progress   . Pharmacy Care Plan       CARE PLAN ENTRY  Current Barriers:  . Chronic Disease Management support, education, and care coordination needs related to Diabetes, Hypertension, Hyperlipidemia, Depression/Anxiety, Insomnia, GERD, Allergic Rhinitis   Hypertension . Pharmacist Clinical Goal(s): o Over the next 90 days, patient will work with PharmD and providers to maintain BP goal <140/90 . Current regimen:  o Lisinopril-hctz 10-12.5mg  daily . Patient self care activities - Over the next 90 days, patient will: o Maintain hypertension medication regimen o Ensure daily salt intake < 2300 mg/Devera Englander  Hyperlipidemia . Pharmacist Clinical Goal(s): o Over the next 180 days, patient will work with PharmD and providers to achieve LDL goal <100 . Current regimen:  o Atorvastatin 40mg  daily . Interventions: o Discussed importance of diet and exercise noting recommended amount of physical activity is 159mins/week . Patient self care activities - Over the next 180 days, patient will: o Increase physical activity to 157mins/week as tolerated  Diabetes . Pharmacist Clinical Goal(s): o Over the next 180 days, patient will work with PharmD and providers to achieve A1c goal <7% . Current regimen:  o Glipizide 10mg  twice daily (pending BG), metformin XR 500mg  #2 twice daily, Lantus 12-20 units daily HS Interventions: o Discussed DM goals . Patient self care activities - Over the next 180 days, patient will: o Check blood sugar twice daily, document, and provide at future appointments o Contact provider with any episodes of hypoglycemia  GERD . Pharmacist Clinical Goal(s) o Over the next 90 days, patient will work with PharmD and providers to reduce symptoms of GERD . Current regimen:  o Omeprazole 20mg  daily . Interventions: o Discussed risk/benefit of continuing vs discontinuing long term PPI o Consider tapering  omeprazole . Patient self care activities - Over the next 90 days, patient will: o Taper omeprazole pending Dr. Etter Sjogren approval o Tapering Plan:  o Week 1: Take every other Lora Glomski o Week 2: Take every 3rd Chan Sheahan (2 days between doses) o Week 3: Take every 4th Eleina Jergens/Twice a week (3 days between doses) o Week: 4: Take once a week o Week 5: Discontinue  Medication management . Pharmacist Clinical Goal(s): o Over the next 90 days, patient will work with PharmD and providers to maintain optimal medication adherence . Current pharmacy: [facility] . Interventions o Comprehensive medication review performed. o Continue current medication management strategy . Patient self care activities - Over the next 90 days, patient will: o Focus on medication adherence by filling medications appropriately  o Take medications as prescribed o Report any questions or concerns to PharmD and/or provider(s)  Initial goal documentation        Ms. Olvera was given information about Chronic Care Management services today including:  1. CCM service includes personalized support from designated clinical staff supervised by her physician, including individualized plan of care and coordination with other care providers 2. 24/7 contact phone numbers for assistance for urgent and routine care needs. 3. Standard insurance, coinsurance, copays and deductibles apply for chronic care management only during months in which we provide at least 20 minutes of these services. Most insurances cover these services at 100%, however patients may be responsible for any copay, coinsurance and/or deductible if applicable. This service may help you avoid the need for more expensive face-to-face services. 4. Only one practitioner may furnish and bill the service in a calendar  month. 5. The patient may stop CCM services at any time (effective at the end of the month) by phone call to the office staff.  Patient agreed to services and verbal consent  obtained.   The patient verbalized understanding of instructions provided today and agreed to receive a mailed copy of patient instruction and/or educational materials. Telephone follow up appointment with pharmacy team member scheduled for: 01/24/2020  Melvenia Beam Vaughn Frieze, PharmD Clinical Pharmacist [facility] [phone]    Cholesterol Content in Foods Cholesterol is a waxy, fat-like substance that helps to carry fat in the blood. The body needs cholesterol in small amounts, but too much cholesterol can cause damage to the arteries and heart. Most people should eat less than 200 milligrams (mg) of cholesterol a Reyce Lubeck. Foods with cholesterol  Cholesterol is found in animal-based foods, such as meat, seafood, and dairy. Generally, low-fat dairy and lean meats have less cholesterol than full-fat dairy and fatty meats. The milligrams of cholesterol per serving (mg per serving) of common cholesterol-containing foods are listed below. Meat and other proteins  Egg -- one large whole egg has 186 mg.  Veal shank -- 4 oz has 141 mg.  Lean ground [place] (93% lean) -- 4 oz has 118 mg.  Fat-trimmed lamb loin -- 4 oz has 106 mg.  Lean ground beef (90% lean) -- 4 oz has 100 mg.  Lobster -- 3.5 oz has 90 mg.  Pork loin chops -- 4 oz has 86 mg.  Canned salmon -- 3.5 oz has 83 mg.  Fat-trimmed beef top loin -- 4 oz has 78 mg.  Frankfurter -- 1 frank (3.5 oz) has 77 mg.  Crab -- 3.5 oz has 71 mg.  Roasted chicken without skin, white meat -- 4 oz has 66 mg.  Light bologna -- 2 oz has 45 mg.  Deli-cut [place] -- 2 oz has 31 mg.  Canned tuna -- 3.5 oz has 31 mg.  Berniece Salines -- 1 oz has 29 mg.  Oysters and mussels (raw) -- 3.5 oz has 25 mg.  Mackerel -- 1 oz has 22 mg.  Trout -- 1 oz has 20 mg.  Pork sausage -- 1 link (1 oz) has 17 mg.  Salmon -- 1 oz has 16 mg.  Tilapia -- 1 oz has 14 mg. Dairy  Soft-serve ice cream --  cup (4 oz) has 103  mg.  Whole-milk yogurt -- 1 cup (8 oz) has 29 mg.  Cheddar cheese -- 1 oz has 28 mg.  American cheese -- 1 oz has 28 mg.  Whole milk -- 1 cup (8 oz) has 23 mg.  2% milk -- 1 cup (8 oz) has 18 mg.  Cream cheese -- 1 tablespoon (Tbsp) has 15 mg.  Cottage cheese --  cup (4 oz) has 14 mg.  Low-fat (1%) milk -- 1 cup (8 oz) has 10 mg.  Sour cream -- 1 Tbsp has 8.5 mg.  Low-fat yogurt -- 1 cup (8 oz) has 8 mg.  Nonfat Greek yogurt -- 1 cup (8 oz) has 7 mg.  Half-and-half cream -- 1 Tbsp has 5 mg. Fats and oils  Cod liver oil -- 1 tablespoon (Tbsp) has 82 mg.  Butter -- 1 Tbsp has 15 mg.  Lard -- 1 Tbsp has 14 mg.  Bacon grease -- 1 Tbsp has 14 mg.  Mayonnaise -- 1 Tbsp has 5-10 mg.  Margarine -- 1 Tbsp has 3-10 mg. Exact amounts of cholesterol in these foods may  vary depending on specific ingredients and brands. Foods without cholesterol Most plant-based foods do not have cholesterol unless you combine them with a food that has cholesterol. Foods without cholesterol include:  Grains and cereals.  Vegetables.  Fruits.  Vegetable oils, such as olive, canola, and sunflower oil.  Legumes, such as peas, beans, and lentils.  Nuts and seeds.  Egg whites. Summary  The body needs cholesterol in small amounts, but too much cholesterol can cause damage to the arteries and heart.  Most people should eat less than 200 milligrams (mg) of cholesterol a Nyaisha Simao. This information is not intended to replace advice given to you by your health care provider. Make sure you discuss any questions you have with your health care provider. Document Revised: 06/12/2017 Document Reviewed: 02/24/2017 Elsevier Patient Education  [place].

## 2019-11-29 NOTE — Chronic Care Management (AMB) (Signed)
Chronic Care Management Pharmacy  Name: Eila Runyan  MRN: [id number] DOB: August 25, 1945   Chief Complaint/ HPI  Bethany Miller,  74 y.o. , female presents for their Initial CCM visit with the clinical pharmacist via telephone due to COVID-19 Pandemic.  PCP : Ann Held, DO  Their chronic conditions include: Diabetes, Hypertension, Hyperlipidemia, Depression/Anxiety, Insomnia, GERD, Allergic Rhinitis  Office Visits: 10/06/19: Visit w/ Dr. Etter Sjogren - Insomnia prescribed trazodone 50mg  HS. Labs ordered (cmp, a1c, lipid)  Consult Visit: 09/13/19: Podiatry visit w/ Dr. Elisha Ponder - Nails debrided  Medications: Outpatient Encounter Medications as of 11/29/2019  Medication Sig Note  . aspirin EC 81 MG tablet Take 81 mg by mouth daily.   Marland Kitchen atorvastatin (LIPITOR) 40 MG tablet Take 1 tablet (40 mg total) by mouth daily.   . citalopram (CELEXA) 40 MG tablet 1 po qd   . docusate sodium (COLACE) 100 MG capsule Take 1 capsule (100 mg total) by mouth 2 (two) times daily as needed for mild constipation.   Marland Kitchen glipiZIDE (GLUCOTROL) 10 MG tablet TAKE 1 TABLET TWICE DAILY BEFORE MEALS. STOP JARDIANCE AND GLIPIZIDE 5MG    . ibuprofen (ADVIL,MOTRIN) 800 MG tablet Take 800 mg by mouth 3 (three) times daily as needed for moderate pain.   . Insulin Glargine (LANTUS SOLOSTAR) 100 UNIT/ML Solostar Pen Inject 12 Units into the skin at bedtime.   Marland Kitchen lisinopril-hydrochlorothiazide (ZESTORETIC) 10-12.5 MG tablet Take 1 tablet by mouth daily.   . Loratadine-Pseudoephedrine (CLARITIN-D 12 HOUR PO) Take 1 tablet by mouth daily as needed (sinus).    . Melatonin 3 MG TABS Take 8 mg by mouth at bedtime as needed (sleep).    . metFORMIN (GLUCOPHAGE-XR) 500 MG 24 hr tablet Take 2 tablets (1,000 mg total) by mouth 2 (two) times daily after a meal.   . omeprazole (PRILOSEC) 20 MG capsule Take 1 capsule (20 mg total) by mouth daily.   Marland Kitchen acetaminophen (TYLENOL) 650 MG CR tablet Take 650 mg by mouth every 8 (eight) hours as  needed for pain.   Marland Kitchen ALPRAZolam (XANAX) 1 MG tablet  11/29/2019: Taking when she had a hand procedure  . glucose blood (ACCU-CHEK GUIDE) test strip USE 1 STRIP TO CHECK GLUCOSE ONCE DAILY   . Insulin Admin Supplies MISC Use to inject insulin 2 times a Kanani Mowbray.   . Insulin Pen Needle 32G X 4 MM MISC Use 1x a Akil Hoos   . Lancets (ACCU-CHEK MULTICLIX) lancets Use as instructed to test once a Gita Dilger DX E11.51   . Multiple Vitamins-Minerals (MULTIVITAMIN ADULT PO) Take 1 tablet by mouth daily.    . Suvorexant (BELSOMRA) 10 MG TABS Take 1 tablet by mouth at bedtime as needed. (Patient not taking: Reported on 11/29/2019)   . traZODone (DESYREL) 50 MG tablet Take 0.5-1 tablets (25-50 mg total) by mouth at bedtime as needed for sleep. (Patient not taking: Reported on 11/29/2019)    No facility-administered encounter medications on file as of 11/29/2019.   SDOH Screenings   Alcohol Screen:   . Last Alcohol Screening Score (AUDIT):   Depression (PHQ2-9): Low Risk   . PHQ-2 Score: 2  Financial Resource Strain:   . Difficulty of Paying Living Expenses:   Food Insecurity:   . Worried About Charity fundraiser in the Last Year:   . [place] in the Last Year:   Housing:   . Last Housing Risk Score:   Physical Activity:   . Days of Exercise per Week:   .  Minutes of Exercise per Session:   Social Connections:   . Frequency of Communication with Friends and Family:   . Frequency of Social Gatherings with Friends and Family:   . Attends Religious Services:   . Active Member of Clubs or Organizations:   . Attends Archivist Meetings:   Marland Kitchen Marital Status:   Stress:   . Feeling of Stress :   Tobacco Use: Low Risk   . Smoking Tobacco Use: Never Smoker  . Smokeless Tobacco Use: Never Used  Transportation Needs:   . Film/video editor (Medical):   Marland Kitchen Lack of Transportation (Non-Medical):      Current Diagnosis/Assessment:  Goals Addressed            This Visit's Progress   . Pharmacy  Care Plan       CARE PLAN ENTRY  Current Barriers:  . Chronic Disease Management support, education, and care coordination needs related to Diabetes, Hypertension, Hyperlipidemia, Depression/Anxiety, Insomnia, GERD, Allergic Rhinitis   Hypertension . Pharmacist Clinical Goal(s): o Over the next 90 days, patient will work with PharmD and providers to maintain BP goal <140/90 . Current regimen:  o Lisinopril-hctz 10-12.5mg  daily . Patient self care activities - Over the next 90 days, patient will: o Maintain hypertension medication regimen o Ensure daily salt intake < 2300 mg/Yalexa Blust  Hyperlipidemia . Pharmacist Clinical Goal(s): o Over the next 180 days, patient will work with PharmD and providers to achieve LDL goal <100 . Current regimen:  o Atorvastatin 40mg  daily . Interventions: o Discussed importance of diet and exercise noting recommended amount of physical activity is 174mins/week . Patient self care activities - Over the next 180 days, patient will: o Increase physical activity to 120mins/week as tolerated  Diabetes . Pharmacist Clinical Goal(s): o Over the next 180 days, patient will work with PharmD and providers to achieve A1c goal <7% . Current regimen:  o Glipizide 10mg  twice daily (pending BG), metformin XR 500mg  #2 twice daily, Lantus 12-20 units daily HS Interventions: o Discussed DM goals . Patient self care activities - Over the next 180 days, patient will: o Check blood sugar twice daily, document, and provide at future appointments o Contact provider with any episodes of hypoglycemia  GERD . Pharmacist Clinical Goal(s) o Over the next 90 days, patient will work with PharmD and providers to reduce symptoms of GERD . Current regimen:  o Omeprazole 20mg  daily . Interventions: o Discussed risk/benefit of continuing vs discontinuing long term PPI o Consider tapering omeprazole . Patient self care activities - Over the next 90 days, patient will: o Taper  omeprazole pending Dr. Etter Sjogren approval o Tapering Plan:  o Week 1: Take every other Yuvan Medinger o Week 2: Take every 3rd Mycah Formica (2 days between doses) o Week 3: Take every 4th Gabriella Woodhead/Twice a week (3 days between doses) o Week: 4: Take once a week o Week 5: Discontinue  Medication management . Pharmacist Clinical Goal(s): o Over the next 90 days, patient will work with PharmD and providers to maintain optimal medication adherence . Current pharmacy: [facility] . Interventions o Comprehensive medication review performed. o Continue current medication management strategy . Patient self care activities - Over the next 90 days, patient will: o Focus on medication adherence by filling medications appropriately  o Take medications as prescribed o Report any questions or concerns to PharmD and/or provider(s)  Initial goal documentation       Cares for her 1 yr old grandson about twice weekly  Uses  pill box  Diabetes   Recent Relevant Labs: Lab Results  Component Value Date/Time   HGBA1C 9.2 (H) 10/06/2019 11:08 AM   HGBA1C 8.6 (H) 04/01/2019 09:31 AM   MICROALBUR 0.6 03/28/2016 04:26 PM   MICROALBUR 0.7 11/16/2015 11:21 AM    A1c goal <7% FBG 80-130  Checking BG: 2x per Foye Haggart  Recent FBG Readings: 161 203 138 124 126 148 116 107 Average 140.375  Patient is currently uncontrolled based on a1c, but improving based on FBG pt reports on the following medications: Glipizide 10mg  twice daily (pending BG), metformin XR 500mg  #2 twice daily, Lantus 12-20 units daily HS (uses a sliding scale based on how she feels) 130s  Blood sugar was 107 this AM so did not take any glipizide States she had a lot of stress due to losing niece due to covid who was the same age as her son. Feels that really ran up her BG.  Followed by Dr. Cruzita Lederer Recommended patient to follow up with Dr. Renne Crigler in June or July for repeat a1c   Last diabetic Eye exam: No results found for: HMDIABEYEEXA (DUE NOW) Last  diabetic Foot exam: No results found for: HMDIABFOOTEX (10/06/19 per Dr. Etter Sjogren office visit note)  We discussed: Importance of DM control and follow up with endo  Plan -Continue current medications  -Complete eye exam  Hypertension   CMP Latest Ref Rng & Units 10/06/2019 04/01/2019 11/30/2018  Glucose 70 - 99 mg/dL 212(H) 138(H) 180(H)  BUN 6 - 23 mg/dL 28(H) 21 16  Creatinine 0.40 - 1.20 mg/dL 1.22(H) 1.12 1.01  Sodium 135 - 145 mEq/L 134(L) 137 137  Potassium 3.5 - 5.1 mEq/L 4.6 4.7 4.6  Chloride 96 - 112 mEq/L 103 105 104  CO2 19 - 32 mEq/L 25 25 27   Calcium 8.4 - 10.5 mg/dL 8.9 9.2 9.2  Total Protein 6.0 - 8.3 g/dL 6.5 6.7 6.6  Total Bilirubin 0.2 - 1.2 mg/dL 0.6 0.5 0.6  Alkaline Phos 39 - 117 U/L 113 88 104  AST 0 - 37 U/L 12 14 11   ALT 0 - 35 U/L 11 13 12    Kidney Function Lab Results  Component Value Date/Time   CREATININE 1.22 (H) 10/06/2019 11:08 AM   CREATININE 1.12 04/01/2019 09:31 AM   CREATININE 1.34 (H) 03/28/2016 04:26 PM   CREATININE 1.15 (H) 12/23/2013 04:17 PM   GFR 43.31 (L) 10/06/2019 11:08 AM   GFRNONAA 29 (L) 11/25/2018 05:40 PM   GFRAA 33 (L) 11/25/2018 05:40 PM    BP today is: Unable to assess due to phone visit   Office blood pressures are  BP Readings from Last 3 Encounters:  10/06/19 132/68  09/13/19 (!) 153/72  04/01/19 130/60    Patient has failed these meds in the past: None noted  Patient is currently controlled on the following medications: Lisinopril-hctz 10-12.5mg  daily  Patient checks BP at home infrequently  Patient home BP readings are ranging: N/A  Plan -Continue current medications   Hyperlipidemia   Lipid Panel     Component Value Date/Time   CHOL 200 10/06/2019 1108   TRIG 169.0 (H) 10/06/2019 1108   TRIG 172 (H) 06/23/2006 0942   HDL 39.70 10/06/2019 1108   CHOLHDL 5 10/06/2019 1108   VLDL 33.8 10/06/2019 1108   LDLCALC 126 (H) 10/06/2019 1108   LDLDIRECT 149.6 01/26/2013 1420    LDL goal <100 TG <150  The  10-year ASCVD risk score Mikey Bussing DC Jr., et al., 2013) is: 30.2%  Values used to calculate the score:     Age: 83 years     Sex: Female     Is Non-Hispanic African American: No     Diabetic: Yes     Tobacco smoker: No     Systolic Blood Pressure: 825 mmHg     Is BP treated: Yes     HDL Cholesterol: 39.7 mg/dL     Total Cholesterol: 200 mg/dL   Patient has failed these meds in past: None noted  Patient is currently uncontrolled on the following medications: aspirin 81mg  daily AM, atorvastatin 40mg  daily AM  Diet Loves veggies (raw carrots, brocoli, tomatoes, squash) and fruit ( Doesn't have issue with sweets. Has issue with carbs B: Cinnamon Raisen bagel and cup of coffee L: Salad or yogurt D: cheese pizza last night; tries to get some protein in (chicken or fish) Drinks: diet drinks, water, 1-2 cups of coffee daily Snacks: too busy to snack  Exercise Walks around her neighborhood 1-2 times per week for 20-30 minutes  We discussed:  diet and exercise extensively and how that affects LDL. Noted that recommended amount of physical activity is 121mins/week  Plan -Continue current medications  -Increase physical activity to 150 minutes per week as tolerated  Future Plan -If LDL >100 at next lipid panel increase atorvastatin to 80mg  daily  Depression/Anxiety    Patient has failed these meds in past: None noted  Patient is currently controlled on the following medications: citalopram 40mg  daily  Feels her meds work really good. Can tell when she misses days of not taking it.   PHQ9 = 2   Feels like she is blessed with a great life, family, and support. 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 Denies Suicidal ideation.  Plan -Continue current medications   Insomnia    Patient has failed these meds in past: Belsomra (cost), trazodone ("made me feel weird, it didn't leave my system" Patient is currently stable on  the following medications: Melatonin 8mg  at night  Working on relaxation and using melatonin Wakes up because she turns a lot and has back pain (hx of back surgery). She will take an ibuprofen to help with the pain then return to sleep. She states she is using meditation and relaxation to help her sleep.   We discussed:  Importance of sleep  Plan -Continue current medications   Future Plan -Research more affordable options for sleep (formulary vs PAP)   GERD    Patient has failed these meds in past: None noted  Patient is currently controlled on the following medications: omeprazole 20mg  daily  Breakthrough Sx: None  We discussed:  Risk/benefit of continuing vs discontinuing PPI long term  Plan -Consider tapering off of omeprazole pending Dr. Etter Sjogren approval  Allergic Rhinitis    Patient has failed these meds in past:  Patient is currently controlled on the following medications: claritinD 12 hr twice daily  Can't take the 24 hr claritinD or she will feel weird. She states she is very sensitive to medications.   Plan -Continue current medications   Miscellaneous Meds Colace PRN (last used last week) Acetaminophen Ibuprofen   Meds to D/C from list  Belsomra Trazodone Alprazolam

## 2019-11-30 ENCOUNTER — Other Ambulatory Visit: Payer: Self-pay

## 2019-11-30 DIAGNOSIS — I10 Essential (primary) hypertension: Secondary | ICD-10-CM

## 2019-11-30 DIAGNOSIS — IMO0002 Reserved for concepts with insufficient information to code with codable children: Secondary | ICD-10-CM

## 2020-01-24 ENCOUNTER — Other Ambulatory Visit (HOSPITAL_BASED_OUTPATIENT_CLINIC_OR_DEPARTMENT_OTHER): Payer: Self-pay | Admitting: Family Medicine

## 2020-01-24 ENCOUNTER — Telehealth: Payer: Medicare Other

## 2020-01-24 DIAGNOSIS — Z1231 Encounter for screening mammogram for malignant neoplasm of breast: Secondary | ICD-10-CM

## 2020-01-31 ENCOUNTER — Ambulatory Visit (HOSPITAL_BASED_OUTPATIENT_CLINIC_OR_DEPARTMENT_OTHER)
Admission: RE | Admit: 2020-01-31 | Discharge: 2020-01-31 | Disposition: A | Discharge status: Home / Self Care | Payer: Medicare Other | Source: Ambulatory Visit | Attending: Family Medicine | Admitting: Family Medicine

## 2020-01-31 ENCOUNTER — Ambulatory Visit (INDEPENDENT_AMBULATORY_CARE_PROVIDER_SITE_OTHER): Payer: Medicare Other | Admitting: Family Medicine

## 2020-01-31 ENCOUNTER — Encounter: Payer: Self-pay | Admitting: Family Medicine

## 2020-01-31 ENCOUNTER — Other Ambulatory Visit: Payer: Self-pay

## 2020-01-31 ENCOUNTER — Encounter (HOSPITAL_BASED_OUTPATIENT_CLINIC_OR_DEPARTMENT_OTHER): Payer: Self-pay

## 2020-01-31 ENCOUNTER — Other Ambulatory Visit: Payer: Self-pay | Admitting: Family Medicine

## 2020-01-31 VITALS — BP 120/60 | HR 64 | Temp 98.2°F | Resp 18 | Ht 62.0 in | Wt 209.6 lb

## 2020-01-31 DIAGNOSIS — E1169 Type 2 diabetes mellitus with other specified complication: Secondary | ICD-10-CM

## 2020-01-31 DIAGNOSIS — M160 Bilateral primary osteoarthritis of hip: Secondary | ICD-10-CM | POA: Diagnosis not present

## 2020-01-31 DIAGNOSIS — E785 Hyperlipidemia, unspecified: Secondary | ICD-10-CM

## 2020-01-31 DIAGNOSIS — Z1231 Encounter for screening mammogram for malignant neoplasm of breast: Secondary | ICD-10-CM | POA: Diagnosis not present

## 2020-01-31 DIAGNOSIS — M5441 Lumbago with sciatica, right side: Secondary | ICD-10-CM

## 2020-01-31 DIAGNOSIS — M545 Low back pain: Secondary | ICD-10-CM | POA: Diagnosis not present

## 2020-01-31 DIAGNOSIS — M25551 Pain in right hip: Secondary | ICD-10-CM | POA: Insufficient documentation

## 2020-01-31 DIAGNOSIS — R531 Weakness: Secondary | ICD-10-CM | POA: Diagnosis not present

## 2020-01-31 DIAGNOSIS — I10 Essential (primary) hypertension: Secondary | ICD-10-CM

## 2020-01-31 DIAGNOSIS — M25552 Pain in left hip: Secondary | ICD-10-CM

## 2020-01-31 DIAGNOSIS — E782 Mixed hyperlipidemia: Secondary | ICD-10-CM | POA: Insufficient documentation

## 2020-01-31 DIAGNOSIS — R5383 Other fatigue: Secondary | ICD-10-CM

## 2020-01-31 LAB — CBC WITH DIFFERENTIAL/PLATELET
Basophils Absolute: 0 10*3/uL (ref 0.0–0.1)
Basophils Relative: 0.6 % (ref 0.0–3.0)
Eosinophils Absolute: 0.2 10*3/uL (ref 0.0–0.7)
Eosinophils Relative: 2.9 % (ref 0.0–5.0)
HCT: 35.5 % — ABNORMAL LOW (ref 36.0–46.0)
Hemoglobin: 12.1 g/dL (ref 12.0–15.0)
Lymphocytes Relative: 20.4 % (ref 12.0–46.0)
Lymphs Abs: 1.5 10*3/uL (ref 0.7–4.0)
MCHC: 34.1 g/dL (ref 30.0–36.0)
MCV: 88.3 fl (ref 78.0–100.0)
Monocytes Absolute: 0.5 10*3/uL (ref 0.1–1.0)
Monocytes Relative: 6.2 % (ref 3.0–12.0)
Neutro Abs: 5.1 10*3/uL (ref 1.4–7.7)
Neutrophils Relative %: 69.9 % (ref 43.0–77.0)
Platelets: 206 10*3/uL (ref 150.0–400.0)
RBC: 4.02 Mil/uL (ref 3.87–5.11)
RDW: 13.7 % (ref 11.5–15.5)
WBC: 7.3 10*3/uL (ref 4.0–10.5)

## 2020-01-31 LAB — LIPID PANEL
Cholesterol: 164 mg/dL (ref 0–200)
HDL: 38.2 mg/dL — ABNORMAL LOW (ref >39.00)
LDL Cholesterol: 89 mg/dL (ref 0–99)
NonHDL: 125.46
Total CHOL/HDL Ratio: 4
Triglycerides: 182 mg/dL — ABNORMAL HIGH (ref 0.0–149.0)
VLDL: 36.4 mg/dL (ref 0.0–40.0)

## 2020-01-31 LAB — COMPREHENSIVE METABOLIC PANEL
ALT: 11 U/L (ref 0–35)
AST: 13 U/L (ref 0–37)
Albumin: 4 g/dL (ref 3.5–5.2)
Alkaline Phosphatase: 97 U/L (ref 39–117)
BUN: 30 mg/dL — ABNORMAL HIGH (ref 6–23)
CO2: 28 mEq/L (ref 19–32)
Calcium: 9.3 mg/dL (ref 8.4–10.5)
Chloride: 105 mEq/L (ref 96–112)
Creatinine, Ser: 1.91 mg/dL — ABNORMAL HIGH (ref 0.40–1.20)
GFR: 25.8 mL/min — ABNORMAL LOW (ref >60.00)
Glucose, Bld: 141 mg/dL — ABNORMAL HIGH (ref 70–99)
Potassium: 5.1 mEq/L (ref 3.5–5.1)
Sodium: 139 mEq/L (ref 135–145)
Total Bilirubin: 0.4 mg/dL (ref 0.2–1.2)
Total Protein: 6.5 g/dL (ref 6.0–8.3)

## 2020-01-31 LAB — TSH: TSH: 2.11 u[IU]/mL (ref 0.35–4.50)

## 2020-01-31 MED ORDER — MELOXICAM 7.5 MG PO TABS: 7.5000 mg | ORAL_TABLET | Freq: Every day | ORAL | 0 refills | Status: DC

## 2020-01-31 MED ORDER — TIZANIDINE HCL 4 MG PO TABS: 4.0000 mg | ORAL_TABLET | Freq: Four times a day (QID) | ORAL | 0 refills | Status: DC | PRN

## 2020-01-31 NOTE — Assessment & Plan Note (Signed)
Well controlled, no changes to meds. Encouraged heart healthy diet such as the DASH diet and exercise as tolerated.  °

## 2020-01-31 NOTE — Assessment & Plan Note (Signed)
Check xray  Muscle relaxer / mobic

## 2020-01-31 NOTE — Patient Instructions (Signed)
Acute Back Pain, Adult Acute back pain is sudden and usually short-lived. It is often caused by an injury to the muscles and tissues in the back. The injury may result from:  A muscle or ligament getting overstretched or torn (strained). Ligaments are tissues that connect bones to each other. Lifting something improperly can cause a back strain.  Wear and tear (degeneration) of the spinal disks. Spinal disks are circular tissue that provides cushioning between the bones of the spine (vertebrae).  Twisting motions, such as while playing sports or doing yard work.  A hit to the back.  Arthritis. You may have a physical exam, lab tests, and imaging tests to find the cause of your pain. Acute back pain usually goes away with rest and home care. Follow these instructions at home: Managing pain, stiffness, and swelling  Take over-the-counter and prescription medicines only as told by your health care provider.  Your health care provider may recommend applying ice during the first 24-48 hours after your pain starts. To do this: ? Put ice in a plastic bag. ? Place a towel between your skin and the bag. ? Leave the ice on for 20 minutes, 2-3 times a day.  If directed, apply heat to the affected area as often as told by your health care provider. Use the heat source that your health care provider recommends, such as a moist heat pack or a heating pad. ? Place a towel between your skin and the heat source. ? Leave the heat on for 20-30 minutes. ? Remove the heat if your skin turns bright red. This is especially important if you are unable to feel pain, heat, or cold. You have a greater risk of getting burned. Activity   Do not stay in bed. Staying in bed for more than 1-2 days can delay your recovery.  Sit up and stand up straight. Avoid leaning forward when you sit, or hunching over when you stand. ? If you work at a desk, sit close to it so you do not need to lean over. Keep your chin tucked  in. Keep your neck drawn back, and keep your elbows bent at a right angle. Your arms should look like the letter "L." ? Sit high and close to the steering wheel when you drive. Add lower back (lumbar) support to your car seat, if needed.  Take short walks on even surfaces as soon as you are able. Try to increase the length of time you walk each day.  Do not sit, drive, or stand in one place for more than 30 minutes at a time. Sitting or standing for long periods of time can put stress on your back.  Do not drive or use heavy machinery while taking prescription pain medicine.  Use proper lifting techniques. When you bend and lift, use positions that put less stress on your back: ? Bend your knees. ? Keep the load close to your body. ? Avoid twisting.  Exercise regularly as told by your health care provider. Exercising helps your back heal faster and helps prevent back injuries by keeping muscles strong and flexible.  Work with a physical therapist to make a safe exercise program, as recommended by your health care provider. Do any exercises as told by your physical therapist. Lifestyle  Maintain a healthy weight. Extra weight puts stress on your back and makes it difficult to have good posture.  Avoid activities or situations that make you feel anxious or stressed. Stress and anxiety increase muscle   tension and can make back pain worse. Learn ways to manage anxiety and stress, such as through exercise. General instructions  Sleep on a firm mattress in a comfortable position. Try lying on your side with your knees slightly bent. If you lie on your back, put a pillow under your knees.  Follow your treatment plan as told by your health care provider. This may include: ? Cognitive or behavioral therapy. ? Acupuncture or massage therapy. ? Meditation or yoga. Contact a health care provider if:  You have pain that is not relieved with rest or medicine.  You have increasing pain going down  into your legs or buttocks.  Your pain does not improve after 2 weeks.  You have pain at night.  You lose weight without trying.  You have a fever or chills. Get help right away if:  You develop new bowel or bladder control problems.  You have unusual weakness or numbness in your arms or legs.  You develop nausea or vomiting.  You develop abdominal pain.  You feel faint. Summary  Acute back pain is sudden and usually short-lived.  Use proper lifting techniques. When you bend and lift, use positions that put less stress on your back.  Take over-the-counter and prescription medicines and apply heat or ice as directed by your health care provider. This information is not intended to replace advice given to you by your health care provider. Make sure you discuss any questions you have with your health care provider. Document Revised: 10/19/2018 Document Reviewed: 02/11/2017 [employer] Patient Education  2020 [employer]  

## 2020-01-31 NOTE — Assessment & Plan Note (Signed)
Encouraged heart healthy diet, increase exercise, avoid trans fats, consider a krill oil cap daily 

## 2020-01-31 NOTE — Progress Notes (Signed)
Patient ID: Bethany Miller, female    DOB: Aug 29, 1945  Age: 74 y.o. MRN: [id number]    Subjective:  Subjective  HPI Bethany Miller presents for b/l hip pain -- it started in Feb with R hip after her hand surgery but now her L hip hurts as well   Pain radiates down her L leg to Knee-- it is a burning pain   Pain is worse in pm and when she gets up in am   Review of Systems  Constitutional: Negative for appetite change, diaphoresis, fatigue and unexpected weight change.  Eyes: Negative for pain, redness and visual disturbance.  Respiratory: Negative for cough, chest tightness, shortness of breath and wheezing.   Cardiovascular: Negative for chest pain, palpitations and leg swelling.  Endocrine: Negative for cold intolerance, heat intolerance, polydipsia, polyphagia and polyuria.  Genitourinary: Negative for difficulty urinating, dysuria and frequency.  Neurological: Negative for dizziness, light-headedness, numbness and headaches.    History Past Medical History:  Diagnosis Date  . Diabetes mellitus   . Hyperlipidemia   . Hypertension   . 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   Her family history includes Alcohol abuse in her father; Alzheimer's disease in her sister; Arthritis in her mother, sister, and another family member; Cancer in her brother and brother; Cancer (age of onset: 32) in her brother; Cancer (age of onset: 31) in her brother; Cirrhosis in her father; Heart disease in her brother and brother; Hyperlipidemia in her brother and brother; Hypertension in her brother and sister; Melanoma in an other family member; Throat cancer in her brother.She reports that she has never smoked. She has never used smokeless tobacco. She reports current alcohol use. She reports that she does not use drugs.  Current Outpatient  Medications on File Prior to Visit  Medication Sig Dispense Refill  . acetaminophen (TYLENOL) 650 MG CR tablet Take 650 mg by mouth every 8 (eight) hours as needed for pain.    Marland Kitchen aspirin EC 81 MG tablet Take 81 mg by mouth daily.    Marland Kitchen atorvastatin (LIPITOR) 40 MG tablet Take 1 tablet (40 mg total) by mouth daily. 90 tablet 1  . citalopram (CELEXA) 40 MG tablet 1 po qd 90 tablet 3  . docusate sodium (COLACE) 100 MG capsule Take 1 capsule (100 mg total) by mouth 2 (two) times daily as needed for mild constipation. 30 capsule 1  . glipiZIDE (GLUCOTROL) 10 MG tablet TAKE 1 TABLET TWICE DAILY BEFORE MEALS. STOP JARDIANCE AND GLIPIZIDE 5MG  180 tablet 1  . glucose blood (ACCU-CHEK GUIDE) test strip USE 1 STRIP TO CHECK GLUCOSE ONCE DAILY 100 each 4  . ibuprofen (ADVIL,MOTRIN) 800 MG tablet Take 800 mg by mouth 3 (three) times daily as needed for moderate pain.    . Insulin Admin Supplies MISC Use to inject insulin 2 times a day. 180 each 0  . Insulin Glargine (LANTUS SOLOSTAR) 100 UNIT/ML Solostar Pen Inject 12 Units into the skin at bedtime. 5 pen 11  . Insulin Pen Needle 32G X 4 MM MISC Use 1x a day 100 each 3  . Lancets (ACCU-CHEK MULTICLIX) lancets Use as instructed to test once a day DX E11.51 100 each 1  . lisinopril-hydrochlorothiazide (ZESTORETIC) 10-12.5 MG tablet Take 1 tablet by mouth daily. 90 tablet 1  . Loratadine-Pseudoephedrine (CLARITIN-D 12 HOUR PO) Take 1 tablet by mouth daily  as needed (sinus).     . Melatonin 3 MG TABS Take 8 mg by mouth at bedtime as needed (sleep).     . metFORMIN (GLUCOPHAGE-XR) 500 MG 24 hr tablet Take 2 tablets (1,000 mg total) by mouth 2 (two) times daily after a meal. 360 tablet 3  . Multiple Vitamins-Minerals (MULTIVITAMIN ADULT PO) Take 1 tablet by mouth daily.     Marland Kitchen omeprazole (PRILOSEC) 20 MG capsule Take 1 capsule (20 mg total) by mouth daily. 90 capsule 3  . traZODone (DESYREL) 50 MG tablet      No current facility-administered medications on file  prior to visit.     Objective:  Objective  Physical Exam Vitals and nursing note reviewed.  Constitutional:      Appearance: She is well-developed.  HENT:     Head: Normocephalic and atraumatic.  Eyes:     Conjunctiva/sclera: Conjunctivae normal.  Neck:     Thyroid: No thyromegaly.     Vascular: No carotid bruit or JVD.  Cardiovascular:     Rate and Rhythm: Normal rate and regular rhythm.     Heart sounds: Normal heart sounds. No murmur heard.   Pulmonary:     Effort: Pulmonary effort is normal. No respiratory distress.     Breath sounds: Normal breath sounds. No wheezing or rales.  Chest:     Chest wall: No tenderness.  Musculoskeletal:        General: No tenderness. Normal range of motion.     Cervical back: Normal range of motion and neck supple.     Right lower leg: No edema.     Left lower leg: No edema.  Neurological:     Mental Status: She is alert and oriented to person, place, and time.    BP 120/60 (BP Location: Right Arm, Patient Position: Sitting, Cuff Size: Large)   Pulse 64   Temp 98.2 F (36.8 C) (Oral)   Resp 18   Ht 5\' 2"  (1.575 m)   Wt 209 lb 9.6 oz (95.1 kg)   SpO2 95%   BMI 38.34 kg/m  Wt Readings from Last 3 Encounters:  01/31/20 209 lb 9.6 oz (95.1 kg)  10/06/19 216 lb (98 kg)  04/01/19 209 lb 6.4 oz (95 kg)     Lab Results  Component Value Date   WBC 7.3 01/31/2020   HGB 12.1 01/31/2020   HCT 35.5 (L) 01/31/2020   PLT 206.0 01/31/2020   GLUCOSE 141 (H) 01/31/2020   CHOL 164 01/31/2020   TRIG 182.0 (H) 01/31/2020   HDL 38.20 (L) 01/31/2020   LDLDIRECT 149.6 01/26/2013   LDLCALC 89 01/31/2020   ALT 11 01/31/2020   AST 13 01/31/2020   NA 139 01/31/2020   K 5.1 01/31/2020   CL 105 01/31/2020   CREATININE 1.91 (H) 01/31/2020   BUN 30 (H) 01/31/2020   CO2 28 01/31/2020   TSH 2.11 01/31/2020   INR 0.96 02/17/2017   HGBA1C 9.2 (H) 10/06/2019   MICROALBUR 0.6 03/28/2016    No results found.   Assessment & Plan:  Plan  I am  having Bethany Miller start on tiZANidine and meloxicam. I am also having her maintain her Multiple Vitamins-Minerals (MULTIVITAMIN ADULT PO), Loratadine-Pseudoephedrine (CLARITIN-D 12 HOUR PO), ibuprofen, melatonin, acetaminophen, docusate sodium, aspirin EC, accu-chek multiclix, glucose blood, insulin glargine, Insulin Pen Needle, Insulin Admin Supplies, metFORMIN, glipiZIDE, omeprazole, lisinopril-hydrochlorothiazide, citalopram, atorvastatin, and traZODone.  Meds ordered this encounter  Medications  . tiZANidine (ZANAFLEX) 4 MG tablet  Sig: Take 1 tablet (4 mg total) by mouth every 6 (six) hours as needed for muscle spasms.    Dispense:  30 tablet    Refill:  0  . meloxicam (MOBIC) 7.5 MG tablet    Sig: Take 1 tablet (7.5 mg total) by mouth daily.    Dispense:  30 tablet    Refill:  0    Problem List Items Addressed This Visit      Unprioritized   ESSENTIAL HYPERTENSION, BENIGN    Well controlled, no changes to meds. Encouraged heart healthy diet such as the DASH diet and exercise as tolerated.       Hip pain, bilateral    Check xray  Muscle relaxer / mobic       Relevant Orders   DG HIPS BILAT W OR W/O PELVIS 2V   Hyperlipidemia associated with type 2 diabetes mellitus ([place])    Encouraged heart healthy diet, increase exercise, avoid trans fats, consider a krill oil cap daily      Relevant Orders   Lipid panel (Completed)   Comprehensive metabolic panel (Completed)   Hyperlipidemia LDL goal <70    Encouraged heart healthy diet, increase exercise, avoid trans fats, consider a krill oil cap daily      low back pain with radiation    Muscle relaxer and mobic Ice/ heat Check xray       Relevant Medications   tiZANidine (ZANAFLEX) 4 MG tablet   meloxicam (MOBIC) 7.5 MG tablet   Other Relevant Orders   DG HIPS BILAT W OR W/O PELVIS 2V   DG Lumbar Spine Complete   Weakness - Primary   Relevant Orders   Lipid panel (Completed)   Comprehensive metabolic panel  (Completed)   CBC with Differential/Platelet (Completed)   TSH (Completed)    Other Visit Diagnoses    Other fatigue       Relevant Orders   Lipid panel (Completed)   Comprehensive metabolic panel (Completed)   CBC with Differential/Platelet (Completed)   TSH (Completed)      Follow-up: Return in about 6 months (around 08/02/2020), or if symptoms worsen or fail to improve, for hyperlipidemia, hypertension---  needs awv with RN.  Ann Held, DO

## 2020-01-31 NOTE — Assessment & Plan Note (Signed)
Muscle relaxer and mobic Ice/ heat Check xray

## 2020-02-01 ENCOUNTER — Other Ambulatory Visit: Payer: Self-pay

## 2020-02-01 MED ORDER — FENOFIBRATE 160 MG PO TABS: 160.0000 mg | ORAL_TABLET | Freq: Every day | ORAL | 2 refills | Status: DC

## 2020-02-02 ENCOUNTER — Telehealth: Payer: Self-pay

## 2020-02-02 NOTE — Telephone Encounter (Signed)
Yes, no problem!

## 2020-02-02 NOTE — Telephone Encounter (Signed)
Patient saw her PCP this week and would like to do a TOC to Dr. Kelton Pillar (in the [facility] location) from Dr. Cruzita Lederer.  Patient is very pleased with her care from Dr. Cruzita Lederer, but for travel reasons, it is a lot closer for patient to be seen at the [facility] location from where she resides. Please advise if transfer is permissible.

## 2020-02-06 NOTE — Telephone Encounter (Signed)
Left message on patient's voice mail asking patient to call our office back at [phone] so we can change her 03/01/2020 f/up endocrinology appt over to a Tues or Wed appt with Dr. Kelton Pillar since we now have provider approval on TOC.

## 2020-02-07 NOTE — Telephone Encounter (Signed)
Pt scheduled with Dr. Kelton Pillar for [facility] in August.

## 2020-02-27 ENCOUNTER — Encounter: Payer: Self-pay | Admitting: Family Medicine

## 2020-02-27 DIAGNOSIS — M5441 Lumbago with sciatica, right side: Secondary | ICD-10-CM

## 2020-02-28 ENCOUNTER — Ambulatory Visit (INDEPENDENT_AMBULATORY_CARE_PROVIDER_SITE_OTHER): Payer: Medicare Other | Admitting: Internal Medicine

## 2020-02-28 ENCOUNTER — Encounter: Payer: Self-pay | Admitting: Internal Medicine

## 2020-02-28 ENCOUNTER — Other Ambulatory Visit: Payer: Self-pay

## 2020-02-28 VITALS — BP 120/60 | HR 78 | Ht 62.0 in | Wt 208.4 lb

## 2020-02-28 DIAGNOSIS — E785 Hyperlipidemia, unspecified: Secondary | ICD-10-CM

## 2020-02-28 DIAGNOSIS — E1142 Type 2 diabetes mellitus with diabetic polyneuropathy: Secondary | ICD-10-CM

## 2020-02-28 DIAGNOSIS — N1832 Chronic kidney disease, stage 3b: Secondary | ICD-10-CM | POA: Diagnosis not present

## 2020-02-28 DIAGNOSIS — E1121 Type 2 diabetes mellitus with diabetic nephropathy: Secondary | ICD-10-CM | POA: Diagnosis not present

## 2020-02-28 DIAGNOSIS — E1165 Type 2 diabetes mellitus with hyperglycemia: Secondary | ICD-10-CM | POA: Diagnosis not present

## 2020-02-28 LAB — BASIC METABOLIC PANEL
BUN: 26 mg/dL — ABNORMAL HIGH (ref 6–23)
CO2: 27 mEq/L (ref 19–32)
Calcium: 9.8 mg/dL (ref 8.4–10.5)
Chloride: 104 mEq/L (ref 96–112)
Creatinine, Ser: 1.34 mg/dL — ABNORMAL HIGH (ref 0.40–1.20)
GFR: 38.82 mL/min — ABNORMAL LOW (ref >60.00)
Glucose, Bld: 142 mg/dL — ABNORMAL HIGH (ref 70–99)
Potassium: 4.6 mEq/L (ref 3.5–5.1)
Sodium: 137 mEq/L (ref 135–145)

## 2020-02-28 LAB — POCT GLYCOSYLATED HEMOGLOBIN (HGB A1C): Hemoglobin A1C: 7.4 % — AB (ref 4.0–5.6)

## 2020-02-28 LAB — POCT GLUCOSE (DEVICE FOR HOME USE): POC Glucose: 154 mg/dl — AB (ref 70–99)

## 2020-02-28 MED ORDER — MELOXICAM 7.5 MG PO TABS: 7.5000 mg | ORAL_TABLET | Freq: Every day | ORAL | 1 refills | Status: DC

## 2020-02-28 NOTE — Patient Instructions (Addendum)
-   HOLD Lantus  - Glipizide 10 mg , 1 tablet before breakfast and 1 tablet before supper  - Metformin 500 mg 2 tabs daily for now    - Check sugar before breakfast and Supper and let me know those readings in 2 weeks      HOW TO TREAT LOW BLOOD SUGARS (Blood sugar LESS THAN 70 MG/DL)  Please follow the RULE OF 15 for the treatment of hypoglycemia treatment (when your (blood sugars are less than 70 mg/dL)    STEP 1: Take 15 grams of carbohydrates when your blood sugar is low, which includes:   3-4 GLUCOSE TABS  OR  3-4 OZ OF JUICE OR REGULAR SODA OR  ONE TUBE OF GLUCOSE GEL     STEP 2: RECHECK blood sugar in 15 MINUTES STEP 3: If your blood sugar is still low at the 15 minute recheck --> then, go back to STEP 1 and treat AGAIN with another 15 grams of carbohydrates.

## 2020-02-28 NOTE — Progress Notes (Signed)
Name: Bethany Miller  Age/ Sex: 74 y.o., female   MRN/ DOB: [id number], Nov 16, 1945     PCP: Ann Held, DO   Reason for Endocrinology Evaluation: Type 2 Diabetes Mellitus  Initial Endocrine Consultative Visit: 04/08/2013    PATIENT IDENTIFIER: Bethany Miller is a 74 y.o. female with a past medical history of HTN and T2DM. The patient has followed with Endocrinology clinic since 04/08/2013 for consultative assistance with management of her diabetes.  DIABETIC HISTORY:    Pt started care at [facility] endocrinology in 03/2013 with Dr. Cruzita Lederer and switched to my care 02/2020  Bethany Miller was diagnosed with T2DM in ~2010.  Pt developed nausea and vomiting to ozempic requiring ED visit (11/2018), jardiance, tradjenta and alogliptin were  Were cost prohibitive. She has a hx of hypoglycemia with glimepeiride 2 mg.Has been on prandial insulin with steroids in the pats. Basal insulin started 2020.   Her hemoglobin A1c has ranged from 7.0% in 2019, peaking at 10.4% in 2020.   SUBJECTIVE:   During the last visit (2020): A1c 9.2 % She was continued on Lantus, metformin and Glipizide was added   Today (02/28/2020): Bethany Miller is here for a diabetes management follow up.  She checks her blood sugars 1 times daily, preprandial to breakfast. The patient has  had hypoglycemic episodes since the last clinic visit, which typically occur 2x /week - most often occuring between 3-4 pm . The patient is  symptomatic with these episodes.   She pt takes lantus on a sliding scale basis between 10-20 units. She also uses Glipizide on as needed basis .  She is prescribed metformin 4 tabs daily but she usually forgets the evening doses.       HOME DIABETES REGIMEN:  Glipizide 10 mg  BID - she uses it as needed if > 160  Lantus 20 units daily - takes between 10-20 units Metformin 500 mg , 2 tabs BID - takes it 2 tabs QAM    Statin: yes ACE-I/ARB: yes    METER DOWNLOAD SUMMARY:  109- 194 mg/dL     DIABETIC COMPLICATIONS: Microvascular complications:   CKD III  Denies: neuropathy , retinopathy   Last Eye Exam: Completed 04/2019  Macrovascular complications:    Denies: CAD, CVA, PVD   HISTORY:  Past Medical History:  Past Medical History:  Diagnosis Date  . Diabetes mellitus   . Hyperlipidemia   . Hypertension   . Murmur    Past Surgical History:  Past Surgical History:  Procedure Laterality Date  . ABDOMINAL HYSTERECTOMY    . BACK SURGERY    . CHOLECYSTECTOMY    . TONSILLECTOMY    . TOTAL KNEE ARTHROPLASTY    . TOTAL KNEE ARTHROPLASTY Right 02/26/2017   Procedure: RIGHT TOTAL KNEE ARTHROPLASTY;  Surgeon: Susa Day, MD;  Location: [facility];  Service: Orthopedics;  Laterality: Right;    Social History:  reports that she has never smoked. She has never used smokeless tobacco. She reports current alcohol use. She reports that she does not use drugs. Family History:  Family History  Problem Relation Age of Onset  . Throat cancer Brother   . Heart disease Brother        MI  . Cancer Brother 80       throat,   . Hyperlipidemia Brother   . Cirrhosis Father   . Alcohol abuse Father   . Heart disease Brother   . Cancer Brother   . Arthritis Mother   .  Hypertension Sister   . Arthritis Sister   . Alzheimer's disease Sister   . Cancer Brother   . Cancer Brother 37       melanoma  . Hypertension Brother   . Hyperlipidemia Brother   . Melanoma Other   . Arthritis Other      HOME MEDICATIONS: Allergies as of 02/28/2020      Reactions   Ozempic (0.25 Or 0.5 Mg-dose) [semaglutide(0.25 Or 0.5mg -dos)] Diarrhea, Nausea And Vomiting   Pt stated, "I got dehydrated from this as well"      Medication List       Accurate as of February 28, 2020  9:30 AM. If you have any questions, ask your nurse or doctor.        accu-chek multiclix lancets Use as instructed to test once a day DX E11.51   acetaminophen 650 MG CR tablet Commonly known as:  TYLENOL Take 650 mg by mouth every 8 (eight) hours as needed for pain.   aspirin EC 81 MG tablet Take 81 mg by mouth daily.   atorvastatin 40 MG tablet Commonly known as: LIPITOR Take 1 tablet (40 mg total) by mouth daily.   citalopram 40 MG tablet Commonly known as: CELEXA 1 po qd   CLARITIN-D 12 HOUR PO Take 1 tablet by mouth daily as needed (sinus).   docusate sodium 100 MG capsule Commonly known as: Colace Take 1 capsule (100 mg total) by mouth 2 (two) times daily as needed for mild constipation.   fenofibrate 160 MG tablet Take 1 tablet (160 mg total) by mouth daily.   glipiZIDE 10 MG tablet Commonly known as: GLUCOTROL TAKE 1 TABLET TWICE DAILY BEFORE MEALS. STOP JARDIANCE AND GLIPIZIDE 5MG  What changed: See the new instructions.   glucose blood test strip Commonly known as: Accu-Chek Guide USE 1 STRIP TO CHECK GLUCOSE ONCE DAILY   ibuprofen 800 MG tablet Commonly known as: ADVIL Take 800 mg by mouth 3 (three) times daily as needed for moderate pain.   Insulin Admin Supplies Misc Use to inject insulin 2 times a day.   Insulin Pen Needle 32G X 4 MM Misc Use 1x a day   Lantus SoloStar 100 UNIT/ML Solostar Pen Generic drug: insulin glargine Inject 10-20 Units into the skin daily. Inject 10-20 units under the skin once daily at bedtime. What changed: Another medication with the same name was removed. Continue taking this medication, and follow the directions you see here. Changed by: Dorita Sciara, MD   lisinopril-hydrochlorothiazide 10-12.5 MG tablet Commonly known as: ZESTORETIC Take 1 tablet by mouth daily.   melatonin 3 MG Tabs tablet Take 8 mg by mouth at bedtime as needed (sleep).   meloxicam 7.5 MG tablet Commonly known as: MOBIC Take 1 tablet (7.5 mg total) by mouth daily.   metFORMIN 500 MG 24 hr tablet Commonly known as: GLUCOPHAGE-XR Take 2 tablets (1,000 mg total) by mouth 2 (two) times daily after a meal.   MULTIVITAMIN ADULT  PO Take 1 tablet by mouth daily.   omeprazole 20 MG capsule Commonly known as: PRILOSEC Take 1 capsule (20 mg total) by mouth daily.   tiZANidine 4 MG tablet Commonly known as: Zanaflex Take 1 tablet (4 mg total) by mouth every 6 (six) hours as needed for muscle spasms.   traZODone 50 MG tablet Commonly known as: DESYREL        OBJECTIVE:   Vital Signs: BP 120/60 (BP Location: Left Arm, Patient Position: Sitting, Cuff Size: Normal)  Pulse 78   Ht 5\' 2"  (1.575 m)   Wt 208 lb 6.4 oz (94.5 kg)   SpO2 98%   BMI 38.12 kg/m   Wt Readings from Last 3 Encounters:  02/28/20 208 lb 6.4 oz (94.5 kg)  01/31/20 209 lb 9.6 oz (95.1 kg)  10/06/19 216 lb (98 kg)     Exam: General: Pt appears well and is in NAD  Neck: General: Supple without adenopathy. Thyroid: Thyroid size normal.  No goiter or nodules appreciated. No thyroid bruit.  Lungs: Clear with good BS bilat with no rales, rhonchi, or wheezes  Heart: RRR with normal S1 and S2 and no gallops; no murmurs; no rub  Abdomen: Normoactive bowel sounds, soft, nontender, without masses or organomegaly palpable  Extremities: No pretibial edema  Neuro: MS is good with appropriate affect, pt is alert and Ox3    DM foot exam:   02/28/2020   The skin of the feet is intact without sores or ulcerations. The pedal pulses are 2+ on right and 2+ on left. The sensation is absent at the right great toe to a screening 5.07, 10 gram monofilament    DATA REVIEWED:  Lab Results  Component Value Date   HGBA1C 7.4 (A) 02/28/2020   HGBA1C 9.2 (H) 10/06/2019   HGBA1C 8.6 (H) 04/01/2019   Lab Results  Component Value Date   MICROALBUR 0.6 03/28/2016   LDLCALC 89 01/31/2020   CREATININE 1.91 (H) 01/31/2020   Lab Results  Component Value Date   MICRALBCREAT 4 03/28/2016     Lab Results  Component Value Date   CHOL 164 01/31/2020   HDL 38.20 (L) 01/31/2020   LDLCALC 89 01/31/2020   LDLDIRECT 149.6 01/26/2013   TRIG 182.0 (H)  01/31/2020   CHOLHDL 4 01/31/2020      Results for Villalva, Bethany Miller (MRN [id number]) as of 02/29/2020 14:16  Ref. Range 02/28/2020 10:10  Sodium Latest Ref Range: 135 - 145 mEq/L 137  Potassium Latest Ref Range: 3.5 - 5.1 mEq/L 4.6  Chloride Latest Ref Range: 96 - 112 mEq/L 104  CO2 Latest Ref Range: 19 - 32 mEq/L 27  Glucose Latest Ref Range: 70 - 99 mg/dL 142 (H)  BUN Latest Ref Range: 6 - 23 mg/dL 26 (H)  Creatinine Latest Ref Range: 0.40 - 1.20 mg/dL 1.34 (H)  Calcium Latest Ref Range: 8.4 - 10.5 mg/dL 9.8  GFR Latest Ref Range: >60.00 mL/min 38.82 (L)   In- Office BG 154 mg/dL   ASSESSMENT / PLAN / RECOMMENDATIONS:   1) Type 2 Diabetes Mellitus, Poorly controlled, With Neuropathic and CKD III complications - Most recent A1c of 7.4 %. Goal A1c < 7.0 %.    -   Pt takes and adjusts her glycemic agents haphazardly. She uses lantus between 10-20 units depending on glucose level at the time of intake, she takes Glipizide if Bg > 160 mg/dL regardless if this was pre- or post prandial. She also forgets to take the 2 evening doses of metformin but takes the 2 morning tablets.  - I explained to the pt that her practice of medication intake especially in the setting of CKD, increased her risk of hypoglycemia.  - We discussed fatal consequences of hypoglycemia - We discussed the pharmacokinetics of each of her glycemic agents, after a long discussion we opted to try Glipizide and Metformin only ( hold insulin) for the next 2 weeks and see how she does. She will contact me with her glucose readings in 2  weeks.  Based on a GFR of < 45, pt will only take 1000 mg of metformin daily      MEDICATIONS: - HOLD Lantus  - Glipizide 10 mg , 1 tablet before breakfast and 1 tablet before supper  - Metformin 500 mg 2 tabs daily for now    EDUCATION / INSTRUCTIONS:  BG monitoring instructions: Patient is instructed to check her blood sugars 2 times a day, fasting and supper .  Call [facility]  cl[facility] if: BG persistently < 70  . I reviewed the Rule of 15 for the treatment of hypoglycemia in detail with the patient. Literature supplied.    2) Diabetic complications:   Eye: Does not have known diabetic retinopathy.   Neuro/ Feet: Does  have known peripheral neuropathy - pt attributes this to foot surgery   Renal: Patient does  have known baseline CKD. She   is on an ACEI/ARB at present.    3) Dyslipidemia: Patient is on a statin. LDL at goal    F/U in 3 months    Signed electronically by: Mack Guise, MD  [facility]  [facility] [address] [place], [place] [address] Phone: [phone] FAX: [phone]   CC: [address] [place] [address] Phone: [phone]  Fax: [phone]  Return to Endocrinology clinic as below: Future Appointments  Date Time Provider [place]  05/03/2020 10:20 AM Ann Held, DO [id number]

## 2020-02-29 DIAGNOSIS — E1165 Type 2 diabetes mellitus with hyperglycemia: Secondary | ICD-10-CM | POA: Insufficient documentation

## 2020-02-29 DIAGNOSIS — E1122 Type 2 diabetes mellitus with diabetic chronic kidney disease: Secondary | ICD-10-CM | POA: Insufficient documentation

## 2020-02-29 DIAGNOSIS — E785 Hyperlipidemia, unspecified: Secondary | ICD-10-CM | POA: Insufficient documentation

## 2020-02-29 DIAGNOSIS — N1832 Chronic kidney disease, stage 3b: Secondary | ICD-10-CM | POA: Insufficient documentation

## 2020-02-29 DIAGNOSIS — E1142 Type 2 diabetes mellitus with diabetic polyneuropathy: Secondary | ICD-10-CM | POA: Insufficient documentation

## 2020-02-29 MED ORDER — GLIPIZIDE 10 MG PO TABS: 10.0000 mg | ORAL_TABLET | Freq: Two times a day (BID) | ORAL | 1 refills | Status: DC

## 2020-02-29 MED ORDER — METFORMIN HCL ER 500 MG PO TB24: 1000.0000 mg | ORAL_TABLET | Freq: Every day | ORAL | 3 refills | Status: DC

## 2020-03-01 ENCOUNTER — Ambulatory Visit: Payer: Medicare Other | Admitting: Internal Medicine

## 2020-03-06 ENCOUNTER — Other Ambulatory Visit: Payer: Self-pay | Admitting: Family Medicine

## 2020-03-06 DIAGNOSIS — E785 Hyperlipidemia, unspecified: Secondary | ICD-10-CM

## 2020-03-06 DIAGNOSIS — I10 Essential (primary) hypertension: Secondary | ICD-10-CM

## 2020-03-06 DIAGNOSIS — F32A Depression, unspecified: Secondary | ICD-10-CM

## 2020-03-15 ENCOUNTER — Encounter: Payer: Self-pay | Admitting: Internal Medicine

## 2020-03-15 ENCOUNTER — Telehealth: Payer: Self-pay | Admitting: Internal Medicine

## 2020-03-15 NOTE — Telephone Encounter (Signed)
Are there any specific questions you need asked when pt is contacted?

## 2020-03-15 NOTE — Telephone Encounter (Signed)
Patient called stating she's come off insulin and she's lost some weight, she's been having a lot of headaches, and her readings are all over the place. Patient would like nurse to give her a call to discuss or she can communicate via mychart. Best call back number is [phone]

## 2020-03-29 ENCOUNTER — Telehealth: Payer: Self-pay | Admitting: Pharmacist

## 2020-03-29 NOTE — Progress Notes (Signed)
Chronic Care Management Pharmacy Assistant   Name: Bethany Miller  MRN: [id number] DOB: [birth year]  Reason for Encounter: Disease State  Patient Questions:  1.  Have you seen any other providers since your last visit? Yes  2.  Any changes in your medicines or health? Yes  PCP : Ann Held, DO   Their chronic conditions include: Diabetes, Hypertension, Hyperlipidemia, Depression/Anxiety, Insomnia, GERD, Allergic Rhinitis.  Office Visit: 01-31-2020 (PCP) Patient presented in the office with Dr. Etter Sjogren c/o b/l hip pain . Patient reports pain radiates down her L leg to knee; burning . Patient was prescribed Tizanidine 4 mg; one tab every six hours PRN for muscle spasms and Meloxicam 7.5 mg; one tab daily.  Consults: 02-28-2020 (Endo) Patient presented in the office with Dr. For DM management f/u. Patient reported taking  lantus on a sliding scale basis between 10-20 units. She also uses Glipizide on as needed basis and is prescribed metformin 4 tabs daily but she usually forgets the evening doses.  Notes indicate medication changes as followed:  Glipizide 10 mg BID and Metformin 500 mg two tabs daily only ( hold insulin) for the next 2 weeks and see how she does. She will contact me with her glucose readings in 2 weeks. Follow up in three months.  Allergies:   Allergies  Allergen Reactions  . Ozempic (0.25 Or 0.5 Mg-Dose) [Semaglutide(0.25 Or 0.5mg -Dos)] Diarrhea and Nausea And Vomiting    Pt stated, "I got dehydrated from this as well"    Medications: Outpatient Encounter Medications as of 03/29/2020  Medication Sig  . acetaminophen (TYLENOL) 650 MG CR tablet Take 650 mg by mouth every 8 (eight) hours as needed for pain.  Marland Kitchen aspirin EC 81 MG tablet Take 81 mg by mouth daily.  Marland Kitchen atorvastatin (LIPITOR) 40 MG tablet TAKE 1 TABLET EVERY DAY  . citalopram (CELEXA) 40 MG tablet TAKE 1 TABLET EVERY DAY  . docusate sodium (COLACE) 100 MG capsule Take 1 capsule (100 mg total) by mouth  2 (two) times daily as needed for mild constipation.  . fenofibrate 160 MG tablet Take 1 tablet (160 mg total) by mouth daily.  Marland Kitchen glipiZIDE (GLUCOTROL) 10 MG tablet Take 1 tablet (10 mg total) by mouth 2 (two) times daily before a meal.  . glucose blood (ACCU-CHEK GUIDE) test strip USE 1 STRIP TO CHECK GLUCOSE ONCE DAILY  . ibuprofen (ADVIL,MOTRIN) 800 MG tablet Take 800 mg by mouth 3 (three) times daily as needed for moderate pain.  . Insulin Admin Supplies MISC Use to inject insulin 2 times a day.  . insulin glargine (LANTUS SOLOSTAR) 100 UNIT/ML Solostar Pen Inject 10-20 Units into the skin daily. Inject 10-20 units under the skin once daily at bedtime.  . Insulin Pen Needle 32G X 4 MM MISC Use 1x a day  . Lancets (ACCU-CHEK MULTICLIX) lancets Use as instructed to test once a day DX E11.51  . lisinopril-hydrochlorothiazide (ZESTORETIC) 10-12.5 MG tablet TAKE 1 TABLET EVERY DAY  . Loratadine-Pseudoephedrine (CLARITIN-D 12 HOUR PO) Take 1 tablet by mouth daily as needed (sinus).   . Melatonin 3 MG TABS Take 8 mg by mouth at bedtime as needed (sleep).   . meloxicam (MOBIC) 7.5 MG tablet Take 1 tablet (7.5 mg total) by mouth daily.  . metFORMIN (GLUCOPHAGE-XR) 500 MG 24 hr tablet Take 2 tablets (1,000 mg total) by mouth daily with breakfast.  . Multiple Vitamins-Minerals (MULTIVITAMIN ADULT PO) Take 1 tablet by mouth daily.   Marland Kitchen  omeprazole (PRILOSEC) 20 MG capsule Take 1 capsule (20 mg total) by mouth daily.  Marland Kitchen tiZANidine (ZANAFLEX) 4 MG tablet Take 1 tablet (4 mg total) by mouth every 6 (six) hours as needed for muscle spasms.  . traZODone (DESYREL) 50 MG tablet    No facility-administered encounter medications on file as of 03/29/2020.    Current Diagnosis: Patient Active Problem List   Diagnosis Date Noted  . Type 2 diabetes mellitus with diabetic polyneuropathy, without long-term current use of insulin ([place]) 02/29/2020  . Uncontrolled type 2 diabetes mellitus with hyperglycemia ([place])  02/29/2020  . Type 2 diabetes mellitus with stage 3b chronic kidney disease, without long-term current use of insulin ([place]) 02/29/2020  . Dyslipidemia 02/29/2020  . Hyperlipidemia associated with type 2 diabetes mellitus ([place]) 01/31/2020  . Weakness 01/31/2020  . Hip pain, bilateral 01/31/2020  . low back pain with radiation 01/31/2020  . Stiffness of finger joint of right hand 08/30/2019  . Encounter for orthopedic follow-up care 08/16/2019  . Carpal tunnel syndrome of right wrist 05/11/2019  . Pain in right hand 05/10/2019  . Atypical chest pain 03/03/2018  . Primary osteoarthritis of right knee 02/26/2017  . Right knee DJD 02/26/2017  . Pre-operative clearance 01/09/2017  . Bronchitis 11/11/2016  . Obesity (BMI 30-39.9) 12/23/2013  . Sinusitis 09/05/2013  . UTI (lower urinary tract infection) 03/05/2012  . ANXIETY 09/26/2009  . SINUSITIS 09/26/2009  . OTHER ACUTE REACTIONS TO STRESS 09/03/2009  . Depression with anxiety 06/18/2009  . Hyperlipidemia LDL goal <70 04/16/2009  . DM (diabetes mellitus) type II uncontrolled, periph vascular disorder ([place]) 01/25/2009  . MYALGIA 01/25/2009  . HYPERGLYCEMIA, FASTING 05/08/2008  . ESSENTIAL HYPERTENSION, BENIGN 01/17/2008  . CHEST PAIN 01/17/2008  . INSOMNIA 12/31/2007  . MURMUR 12/31/2007    Goals Addressed   None     Recent Relevant Labs: Lab Results  Component Value Date/Time   HGBA1C 7.4 (A) 02/28/2020 09:25 AM   HGBA1C 9.2 (H) 10/06/2019 11:08 AM   HGBA1C 8.6 (H) 04/01/2019 09:31 AM   MICROALBUR 0.6 03/28/2016 04:26 PM   MICROALBUR 0.7 11/16/2015 11:21 AM    Kidney Function Lab Results  Component Value Date/Time   CREATININE 1.34 (H) 02/28/2020 10:10 AM   CREATININE 1.91 (H) 01/31/2020 10:03 AM   CREATININE 1.34 (H) 03/28/2016 04:26 PM   CREATININE 1.15 (H) 12/23/2013 04:17 PM   GFR 38.82 (L) 02/28/2020 10:10 AM   GFRNONAA 29 (L) 11/25/2018 05:40 PM   GFRAA 33 (L) 11/25/2018 05:40 PM     Chronic Care  Management   Outreach Note  04/03/2020 Name: Bethany Miller MRN: [id number] DOB: 1945/08/28  Referred by: Ann Held, DO Reason for referral : Chronic Care Management   Third unsuccessful telephone outreach was attempted today. The patient was referred to the pharmacist for assistance with care management and care coordination.    Fanny Skates, [place] Pharmacist Assistant [phone]

## 2020-04-03 ENCOUNTER — Ambulatory Visit: Payer: Medicare Other | Admitting: Family Medicine

## 2020-04-05 ENCOUNTER — Other Ambulatory Visit: Payer: Self-pay | Admitting: Family Medicine

## 2020-04-11 ENCOUNTER — Telehealth: Payer: Self-pay | Admitting: Pharmacist

## 2020-04-11 NOTE — Progress Notes (Addendum)
Verified Adherence Gap Information. Per insurance data, the patient is has met preventative care and screening for Clinical depression and compliant with breast cancer screening. Patient has not met her AWV screening. Ace/Arb and Statin medication adherence information not available.  Fanny Skates, [place] Pharmacist Assistant [phone]  Pt will be due for 6 month follow up November 2021. Reviewed by: De Blanch, PharmD Clinical Pharmacist [facility] [phone]

## 2020-04-25 ENCOUNTER — Telehealth: Payer: Self-pay | Admitting: Pharmacist

## 2020-04-25 NOTE — Progress Notes (Addendum)
Chronic Care Management Pharmacy Assistant   Name: Bethany Miller  MRN: [id number] DOB: [birth year]  Reason for Encounter: Disease State  Patient Questions:  1.  Have you seen any other providers since your last visit? Yes  2.  Any changes in your medicines or health? Yes   PCP : Ann Held, DO  Their chronic conditions include:Diabetes, Hypertension, Hyperlipidemia, Depression/Anxiety, Insomnia, GERD, Allergic Rhinitis.  Office Visit: 01-31-2020 (PCP) Patient presented in the office with Dr. Etter Sjogren c/o b/l hip pain . Patient reports pain radiates down her L leg to knee; burning . Patient was prescribed Tizanidine 4 mg; one tab every six hours PRN for muscle spasms and Meloxicam 7.5 mg; one tab daily.  Consults: 02-28-2020 (Endo) Patient presented in the office with Dr. For DM management f/u. Patient reported taking  lantus on a sliding scale basis between 10-20 units. She also uses Glipizide on as needed basis and is prescribed metformin 4 tabs daily but she usually forgets the evening doses. Notes indicate medication changes as followed: Glipizide 10 mg BID and Metformin 500 mg two tabs daily only ( hold insulin) for the next 2 weeks and see how she does. She will contact me with her glucose readings in 2 weeks. Follow up in three months.  Allergies:   Allergies  Allergen Reactions  . Ozempic (0.25 Or 0.5 Mg-Dose) [Semaglutide(0.25 Or 0.5mg -Dos)] Diarrhea and Nausea And Vomiting    Pt stated, "I got dehydrated from this as well"    Medications: Outpatient Encounter Medications as of 04/25/2020  Medication Sig  . acetaminophen (TYLENOL) 650 MG CR tablet Take 650 mg by mouth every 8 (eight) hours as needed for pain.  Marland Kitchen aspirin EC 81 MG tablet Take 81 mg by mouth daily.  Marland Kitchen atorvastatin (LIPITOR) 40 MG tablet TAKE 1 TABLET EVERY DAY  . citalopram (CELEXA) 40 MG tablet TAKE 1 TABLET EVERY DAY  . docusate sodium (COLACE) 100 MG capsule Take 1 capsule (100 mg total) by  mouth 2 (two) times daily as needed for mild constipation.  . fenofibrate 160 MG tablet Take 1 tablet (160 mg total) by mouth daily.  Marland Kitchen glipiZIDE (GLUCOTROL) 10 MG tablet Take 1 tablet (10 mg total) by mouth 2 (two) times daily before a meal.  . glucose blood (ACCU-CHEK GUIDE) test strip USE 1 STRIP TO CHECK GLUCOSE ONCE DAILY  . ibuprofen (ADVIL,MOTRIN) 800 MG tablet Take 800 mg by mouth 3 (three) times daily as needed for moderate pain.  . Insulin Admin Supplies MISC Use to inject insulin 2 times a day.  . insulin glargine (LANTUS SOLOSTAR) 100 UNIT/ML Solostar Pen Inject 10-20 Units into the skin daily. Inject 10-20 units under the skin once daily at bedtime.  . Insulin Pen Needle 32G X 4 MM MISC Use 1x a day  . Lancets (ACCU-CHEK MULTICLIX) lancets Use as instructed to test once a day DX E11.51  . lisinopril-hydrochlorothiazide (ZESTORETIC) 10-12.5 MG tablet TAKE 1 TABLET EVERY DAY  . Loratadine-Pseudoephedrine (CLARITIN-D 12 HOUR PO) Take 1 tablet by mouth daily as needed (sinus).   . Melatonin 3 MG TABS Take 8 mg by mouth at bedtime as needed (sleep).   . meloxicam (MOBIC) 7.5 MG tablet Take 1 tablet (7.5 mg total) by mouth daily.  . metFORMIN (GLUCOPHAGE-XR) 500 MG 24 hr tablet Take 2 tablets (1,000 mg total) by mouth daily with breakfast.  . Multiple Vitamins-Minerals (MULTIVITAMIN ADULT PO) Take 1 tablet by mouth daily.   Marland Kitchen omeprazole (PRILOSEC) 20  MG capsule Take 1 capsule (20 mg total) by mouth daily.  Marland Kitchen tiZANidine (ZANAFLEX) 4 MG tablet Take 1 tablet (4 mg total) by mouth every 6 (six) hours as needed for muscle spasms.  . traZODone (DESYREL) 50 MG tablet TAKE 1/2 TO 1 (ONE-HALF TO ONE) TABLET BY MOUTH AT BEDTIME AS NEEDED FOR  SLEEP   No facility-administered encounter medications on file as of 04/25/2020.    Current Diagnosis: Patient Active Problem List   Diagnosis Date Noted  . Type 2 diabetes mellitus with diabetic polyneuropathy, without long-term current use of insulin  ([place]) 02/29/2020  . Uncontrolled type 2 diabetes mellitus with hyperglycemia ([place]) 02/29/2020  . Type 2 diabetes mellitus with stage 3b chronic kidney disease, without long-term current use of insulin ([place]) 02/29/2020  . Dyslipidemia 02/29/2020  . Hyperlipidemia associated with type 2 diabetes mellitus ([place]) 01/31/2020  . Weakness 01/31/2020  . Hip pain, bilateral 01/31/2020  . low back pain with radiation 01/31/2020  . Stiffness of finger joint of right hand 08/30/2019  . Encounter for orthopedic follow-up care 08/16/2019  . Carpal tunnel syndrome of right wrist 05/11/2019  . Pain in right hand 05/10/2019  . Atypical chest pain 03/03/2018  . Primary osteoarthritis of right knee 02/26/2017  . Right knee DJD 02/26/2017  . Pre-operative clearance 01/09/2017  . Bronchitis 11/11/2016  . Obesity (BMI 30-39.9) 12/23/2013  . Sinusitis 09/05/2013  . UTI (lower urinary tract infection) 03/05/2012  . ANXIETY 09/26/2009  . SINUSITIS 09/26/2009  . OTHER ACUTE REACTIONS TO STRESS 09/03/2009  . Depression with anxiety 06/18/2009  . Hyperlipidemia LDL goal <70 04/16/2009  . DM (diabetes mellitus) type II uncontrolled, periph vascular disorder ([place]) 01/25/2009  . MYALGIA 01/25/2009  . HYPERGLYCEMIA, FASTING 05/08/2008  . ESSENTIAL HYPERTENSION, BENIGN 01/17/2008  . CHEST PAIN 01/17/2008  . INSOMNIA 12/31/2007  . MURMUR 12/31/2007    Goals Addressed   None    Recent Relevant Labs: Lab Results  Component Value Date/Time   HGBA1C 7.4 (A) 02/28/2020 09:25 AM   HGBA1C 9.2 (H) 10/06/2019 11:08 AM   HGBA1C 8.6 (H) 04/01/2019 09:31 AM   MICROALBUR 0.6 03/28/2016 04:26 PM   MICROALBUR 0.7 11/16/2015 11:21 AM    Kidney Function Lab Results  Component Value Date/Time   CREATININE 1.34 (H) 02/28/2020 10:10 AM   CREATININE 1.91 (H) 01/31/2020 10:03 AM   CREATININE 1.34 (H) 03/28/2016 04:26 PM   CREATININE 1.15 (H) 12/23/2013 04:17 PM   GFR 38.82 (L) 02/28/2020 10:10 AM   GFRNONAA 29 (L)  11/25/2018 05:40 PM   GFRAA 33 (L) 11/25/2018 05:40 PM    . Current antihyperglycemic regimen:  o Glipizide 10mg  twice daily (pending BG),  o metformin XR 500mg  #2 twice daily o Lantus 12-20 units daily HS     Chronic Care Management   Outreach Note  05/03/2020 Name: Bethany Miller MRN: [id number] DOB: 09-Oct-1945  Referred by: Ann Held, DO Reason for referral : Chronic Care Management   Third unsuccessful telephone outreach was attempted today. The patient was referred to the pharmacist for assistance with care management and care coordination. Left a message with the patient. Missed her last scheduled visit with the clinical pharmacist and was not available the last two attempts to complete a Disease State call.   Follow-Up:  Pharmacist Review   Fanny Skates, [place] Pharmacist Assistant [phone]

## 2020-05-03 ENCOUNTER — Ambulatory Visit (INDEPENDENT_AMBULATORY_CARE_PROVIDER_SITE_OTHER): Payer: Medicare Other | Admitting: Family Medicine

## 2020-05-03 ENCOUNTER — Encounter: Payer: Self-pay | Admitting: Family Medicine

## 2020-05-03 ENCOUNTER — Other Ambulatory Visit: Payer: Self-pay

## 2020-05-03 VITALS — BP 120/60 | HR 67 | Temp 97.9°F | Resp 18 | Ht 62.0 in | Wt 198.8 lb

## 2020-05-03 DIAGNOSIS — E1169 Type 2 diabetes mellitus with other specified complication: Secondary | ICD-10-CM | POA: Diagnosis not present

## 2020-05-03 DIAGNOSIS — M5441 Lumbago with sciatica, right side: Secondary | ICD-10-CM | POA: Diagnosis not present

## 2020-05-03 DIAGNOSIS — G4700 Insomnia, unspecified: Secondary | ICD-10-CM | POA: Diagnosis not present

## 2020-05-03 DIAGNOSIS — IMO0002 Reserved for concepts with insufficient information to code with codable children: Secondary | ICD-10-CM

## 2020-05-03 DIAGNOSIS — F418 Other specified anxiety disorders: Secondary | ICD-10-CM | POA: Diagnosis not present

## 2020-05-03 DIAGNOSIS — I10 Essential (primary) hypertension: Secondary | ICD-10-CM | POA: Diagnosis not present

## 2020-05-03 DIAGNOSIS — E1151 Type 2 diabetes mellitus with diabetic peripheral angiopathy without gangrene: Secondary | ICD-10-CM

## 2020-05-03 DIAGNOSIS — Z1211 Encounter for screening for malignant neoplasm of colon: Secondary | ICD-10-CM

## 2020-05-03 DIAGNOSIS — E1165 Type 2 diabetes mellitus with hyperglycemia: Secondary | ICD-10-CM

## 2020-05-03 DIAGNOSIS — E785 Hyperlipidemia, unspecified: Secondary | ICD-10-CM | POA: Diagnosis not present

## 2020-05-03 DIAGNOSIS — Z23 Encounter for immunization: Secondary | ICD-10-CM | POA: Diagnosis not present

## 2020-05-03 LAB — COMPREHENSIVE METABOLIC PANEL
AG Ratio: 1.6 (calc) (ref 1.0–2.5)
ALT: 11 U/L (ref 6–29)
AST: 12 U/L (ref 10–35)
Albumin: 3.9 g/dL (ref 3.6–5.1)
Alkaline phosphatase (APISO): 86 U/L (ref 37–153)
BUN/Creatinine Ratio: 15 (calc) (ref 6–22)
BUN: 22 mg/dL (ref 7–25)
CO2: 24 mmol/L (ref 20–32)
Calcium: 9.6 mg/dL (ref 8.6–10.4)
Chloride: 108 mmol/L (ref 98–110)
Creat: 1.51 mg/dL — ABNORMAL HIGH (ref 0.60–0.93)
Globulin: 2.4 g/dL (calc) (ref 1.9–3.7)
Glucose, Bld: 226 mg/dL — ABNORMAL HIGH (ref 65–99)
Potassium: 4.6 mmol/L (ref 3.5–5.3)
Sodium: 139 mmol/L (ref 135–146)
Total Bilirubin: 0.3 mg/dL (ref 0.2–1.2)
Total Protein: 6.3 g/dL (ref 6.1–8.1)

## 2020-05-03 LAB — LIPID PANEL
Cholesterol: 181 mg/dL (ref <200)
HDL: 36 mg/dL — ABNORMAL LOW (ref >=50)
LDL Cholesterol (Calc): 108 mg/dL (calc) — ABNORMAL HIGH
Non-HDL Cholesterol (Calc): 145 mg/dL (calc) — ABNORMAL HIGH (ref <130)
Total CHOL/HDL Ratio: 5 (calc) — ABNORMAL HIGH (ref <5.0)
Triglycerides: 247 mg/dL — ABNORMAL HIGH (ref <150)

## 2020-05-03 MED ORDER — TRAZODONE HCL 50 MG PO TABS: ORAL_TABLET | ORAL | 1 refills | Status: DC

## 2020-05-03 MED ORDER — MELOXICAM 7.5 MG PO TABS: 7.5000 mg | ORAL_TABLET | Freq: Every day | ORAL | 1 refills | Status: DC

## 2020-05-03 NOTE — Patient Instructions (Signed)
DASH Eating Plan DASH stands for "Dietary Approaches to Stop Hypertension." The DASH eating plan is a healthy eating plan that has been shown to reduce high blood pressure (hypertension). It may also reduce your risk for type 2 diabetes, heart disease, and stroke. The DASH eating plan may also help with weight loss. What are tips for following this plan?  General guidelines  Avoid eating more than 2,300 mg (milligrams) of salt (sodium) a day. If you have hypertension, you may need to reduce your sodium intake to 1,500 mg a day.  Limit alcohol intake to no more than 1 drink a day for nonpregnant women and 2 drinks a day for men. One drink equals 12 oz of beer, 5 oz of wine, or 1 oz of hard liquor.  Work with your health care provider to maintain a healthy body weight or to lose weight. Ask what an ideal weight is for you.  Get at least 30 minutes of exercise that causes your heart to beat faster (aerobic exercise) most days of the week. Activities may include walking, swimming, or biking.  Work with your health care provider or diet and nutrition specialist (dietitian) to adjust your eating plan to your individual calorie needs. Reading food labels   Check food labels for the amount of sodium per serving. Choose foods with less than 5 percent of the Daily Value of sodium. Generally, foods with less than 300 mg of sodium per serving fit into this eating plan.  To find whole grains, look for the word "whole" as the first word in the ingredient list. Shopping  Buy products labeled as "low-sodium" or "no salt added."  Buy fresh foods. Avoid canned foods and premade or frozen meals. Cooking  Avoid adding salt when cooking. Use salt-free seasonings or herbs instead of table salt or sea salt. Check with your health care provider or pharmacist before using salt substitutes.  Do not fry foods. Cook foods using healthy methods such as baking, boiling, grilling, and broiling instead.  Cook with  heart-healthy oils, such as olive, canola, soybean, or sunflower oil. Meal planning  Eat a balanced diet that includes: ? 5 or more servings of fruits and vegetables each day. At each meal, try to fill half of your plate with fruits and vegetables. ? Up to 6-8 servings of whole grains each day. ? Less than 6 oz of lean meat, poultry, or fish each day. A 3-oz serving of meat is about the same size as a deck of cards. One egg equals 1 oz. ? 2 servings of low-fat dairy each day. ? A serving of nuts, seeds, or beans 5 times each week. ? Heart-healthy fats. Healthy fats called Omega-3 fatty acids are found in foods such as flaxseeds and coldwater fish, like sardines, salmon, and mackerel.  Limit how much you eat of the following: ? Canned or prepackaged foods. ? Food that is high in trans fat, such as fried foods. ? Food that is high in saturated fat, such as fatty meat. ? Sweets, desserts, sugary drinks, and other foods with added sugar. ? Full-fat dairy products.  Do not salt foods before eating.  Try to eat at least 2 vegetarian meals each week.  Eat more home-cooked food and less restaurant, buffet, and fast food.  When eating at a restaurant, ask that your food be prepared with less salt or no salt, if possible. What foods are recommended? The items listed may not be a complete list. Talk with your dietitian about   what dietary choices are best for you. Grains Whole-grain or whole-wheat bread. Whole-grain or whole-wheat pasta. Brown rice. Oatmeal. Quinoa. Bulgur. Whole-grain and low-sodium cereals. Pita bread. Low-fat, low-sodium crackers. Whole-wheat flour tortillas. Vegetables Fresh or frozen vegetables (raw, steamed, roasted, or grilled). Low-sodium or reduced-sodium tomato and vegetable juice. Low-sodium or reduced-sodium tomato sauce and tomato paste. Low-sodium or reduced-sodium canned vegetables. Fruits All fresh, dried, or frozen fruit. Canned fruit in natural juice (without  added sugar). Meat and other protein foods Skinless chicken or turkey. Ground chicken or turkey. Pork with fat trimmed off. Fish and seafood. Egg whites. Dried beans, peas, or lentils. Unsalted nuts, nut butters, and seeds. Unsalted canned beans. Lean cuts of beef with fat trimmed off. Low-sodium, lean deli meat. Dairy Low-fat (1%) or fat-free (skim) milk. Fat-free, low-fat, or reduced-fat cheeses. Nonfat, low-sodium ricotta or cottage cheese. Low-fat or nonfat yogurt. Low-fat, low-sodium cheese. Fats and oils Soft margarine without trans fats. Vegetable oil. Low-fat, reduced-fat, or light mayonnaise and salad dressings (reduced-sodium). Canola, safflower, olive, soybean, and sunflower oils. Avocado. Seasoning and other foods Herbs. Spices. Seasoning mixes without salt. Unsalted popcorn and pretzels. Fat-free sweets. What foods are not recommended? The items listed may not be a complete list. Talk with your dietitian about what dietary choices are best for you. Grains Baked goods made with fat, such as croissants, muffins, or some breads. Dry pasta or rice meal packs. Vegetables Creamed or fried vegetables. Vegetables in a cheese sauce. Regular canned vegetables (not low-sodium or reduced-sodium). Regular canned tomato sauce and paste (not low-sodium or reduced-sodium). Regular tomato and vegetable juice (not low-sodium or reduced-sodium). Pickles. Olives. Fruits Canned fruit in a light or heavy syrup. Fried fruit. Fruit in cream or butter sauce. Meat and other protein foods Fatty cuts of meat. Ribs. Fried meat. Bacon. Sausage. Bologna and other processed lunch meats. Salami. Fatback. Hotdogs. Bratwurst. Salted nuts and seeds. Canned beans with added salt. Canned or smoked fish. Whole eggs or egg yolks. Chicken or turkey with skin. Dairy Whole or 2% milk, cream, and half-and-half. Whole or full-fat cream cheese. Whole-fat or sweetened yogurt. Full-fat cheese. Nondairy creamers. Whipped toppings.  Processed cheese and cheese spreads. Fats and oils Butter. Stick margarine. Lard. Shortening. Ghee. Bacon fat. Tropical oils, such as coconut, palm kernel, or palm oil. Seasoning and other foods Salted popcorn and pretzels. Onion salt, garlic salt, seasoned salt, table salt, and sea salt. Worcestershire sauce. Tartar sauce. Barbecue sauce. Teriyaki sauce. Soy sauce, including reduced-sodium. Steak sauce. Canned and packaged gravies. Fish sauce. Oyster sauce. Cocktail sauce. Horseradish that you find on the shelf. Ketchup. Mustard. Meat flavorings and tenderizers. Bouillon cubes. Hot sauce and Tabasco sauce. Premade or packaged marinades. Premade or packaged taco seasonings. Relishes. Regular salad dressings. Where to find more information:  National Heart, Lung, and Blood Institute: www.nhlbi.nih.gov  American Heart Association: www.heart.org Summary  The DASH eating plan is a healthy eating plan that has been shown to reduce high blood pressure (hypertension). It may also reduce your risk for type 2 diabetes, heart disease, and stroke.  With the DASH eating plan, you should limit salt (sodium) intake to 2,300 mg a day. If you have hypertension, you may need to reduce your sodium intake to 1,500 mg a day.  When on the DASH eating plan, aim to eat more fresh fruits and vegetables, whole grains, lean proteins, low-fat dairy, and heart-healthy fats.  Work with your health care provider or diet and nutrition specialist (dietitian) to adjust your eating plan to your   individual calorie needs. This information is not intended to replace advice given to you by your health care provider. Make sure you discuss any questions you have with your health care provider. Document Revised: 06/12/2017 Document Reviewed: 06/23/2016 Elsevier Patient Education  2020 [employer].  

## 2020-05-03 NOTE — Assessment & Plan Note (Addendum)
stable °

## 2020-05-03 NOTE — Assessment & Plan Note (Signed)
Per endo °

## 2020-05-03 NOTE — Progress Notes (Signed)
Patient ID: Bethany Miller, female    DOB: [birth year]  Age: 74 y.o. MRN: [id number]    Subjective:  Subjective  HPI Bethany Miller presents for f/u bp and chol.   Pt is seeing endo for dm.  HYPERTENSION   Blood pressure range-not checking  Chest pain- no      Dyspnea- no Lightheadedness- no   Edema- no  Other side effects - no   Medication compliance: good Low salt diet- yes     DIABETES    Blood Sugar ranges- running high  Polyuria- no New Visual problems- no  Hypoglycemic symptoms- no  Other side effects-no Medication compliance - good Last eye exam- due  Foot exam- foot   HYPERLIPIDEMIA  Medication compliance- good RUQ pain- no  Muscle aches- no Other side effects-no      Review of Systems  Constitutional: Negative for appetite change, diaphoresis, fatigue, fever and unexpected weight change.  HENT: Negative for congestion.   Eyes: Negative for pain, redness and visual disturbance.  Respiratory: Negative for cough, chest tightness, shortness of breath and wheezing.   Cardiovascular: Negative for chest pain, palpitations and leg swelling.  Gastrointestinal: Negative for abdominal pain, blood in stool and nausea.  Endocrine: Negative for cold intolerance, heat intolerance, polydipsia, polyphagia and polyuria.  Genitourinary: Negative for difficulty urinating, dysuria and frequency.  Skin: Negative for rash.  Allergic/Immunologic: Negative for environmental allergies.  Neurological: Negative for dizziness, light-headedness, numbness and headaches.  Psychiatric/Behavioral: The patient is not nervous/anxious.     History Past Medical History:  Diagnosis Date  . Diabetes mellitus   . Hyperlipidemia   . Hypertension   . 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   Her family history includes Alcohol abuse  in her father; Alzheimer's disease in her sister; Arthritis in her mother, sister, and another family member; Cancer in her brother and brother; Cancer (age of onset: 63) in her brother; Cancer (age of onset: 21) in her brother; Cirrhosis in her father; Heart disease in her brother and brother; Hyperlipidemia in her brother and brother; Hypertension in her brother and sister; Melanoma in an other family member; Throat cancer in her brother.She reports that she has never smoked. She has never used smokeless tobacco. She reports current alcohol use. She reports that she does not use drugs.  Current Outpatient Medications on File Prior to Visit  Medication Sig Dispense Refill  . acetaminophen (TYLENOL) 650 MG CR tablet Take 650 mg by mouth every 8 (eight) hours as needed for pain.    Marland Kitchen aspirin EC 81 MG tablet Take 81 mg by mouth daily.    Marland Kitchen atorvastatin (LIPITOR) 40 MG tablet TAKE 1 TABLET EVERY DAY 90 tablet 1  . citalopram (CELEXA) 40 MG tablet TAKE 1 TABLET EVERY DAY 90 tablet 3  . docusate sodium (COLACE) 100 MG capsule Take 1 capsule (100 mg total) by mouth 2 (two) times daily as needed for mild constipation. 30 capsule 1  . fenofibrate 160 MG tablet Take 1 tablet (160 mg total) by mouth daily. 30 tablet 2  . glipiZIDE (GLUCOTROL) 10 MG tablet Take 1 tablet (10 mg total) by mouth 2 (two) times daily before a meal. 180 tablet 1  . glucose blood (ACCU-CHEK GUIDE) test strip USE 1 STRIP TO CHECK GLUCOSE ONCE DAILY 100 each 4  . ibuprofen (ADVIL,MOTRIN) 800 MG tablet Take 800 mg  by mouth 3 (three) times daily as needed for moderate pain.    . Insulin Admin Supplies MISC Use to inject insulin 2 times a day. 180 each 0  . insulin glargine (LANTUS SOLOSTAR) 100 UNIT/ML Solostar Pen Inject 10-20 Units into the skin daily. Inject 10-20 units under the skin once daily at bedtime.    . Insulin Pen Needle 32G X 4 MM MISC Use 1x a day 100 each 3  . Lancets (ACCU-CHEK MULTICLIX) lancets Use as instructed to test  once a day DX E11.51 100 each 1  . lisinopril-hydrochlorothiazide (ZESTORETIC) 10-12.5 MG tablet TAKE 1 TABLET EVERY DAY 90 tablet 1  . Loratadine-Pseudoephedrine (CLARITIN-D 12 HOUR PO) Take 1 tablet by mouth daily as needed (sinus).     . Melatonin 3 MG TABS Take 8 mg by mouth at bedtime as needed (sleep).     . metFORMIN (GLUCOPHAGE-XR) 500 MG 24 hr tablet Take 2 tablets (1,000 mg total) by mouth daily with breakfast. 180 tablet 3  . Multiple Vitamins-Minerals (MULTIVITAMIN ADULT PO) Take 1 tablet by mouth daily.     Marland Kitchen omeprazole (PRILOSEC) 20 MG capsule Take 1 capsule (20 mg total) by mouth daily. 90 capsule 3  . tiZANidine (ZANAFLEX) 4 MG tablet Take 1 tablet (4 mg total) by mouth every 6 (six) hours as needed for muscle spasms. 30 tablet 0   No current facility-administered medications on file prior to visit.     Objective:  Objective  Physical Exam Vitals and nursing note reviewed.  Constitutional:      Appearance: She is well-developed.  HENT:     Head: Normocephalic and atraumatic.  Eyes:     Conjunctiva/sclera: Conjunctivae normal.  Neck:     Thyroid: No thyromegaly.     Vascular: No carotid bruit or JVD.  Cardiovascular:     Rate and Rhythm: Normal rate and regular rhythm.     Heart sounds: Normal heart sounds. No murmur heard.   Pulmonary:     Effort: Pulmonary effort is normal. No respiratory distress.     Breath sounds: Normal breath sounds. No wheezing or rales.  Chest:     Chest wall: No tenderness.  Musculoskeletal:     Cervical back: Normal range of motion and neck supple.  Neurological:     Mental Status: She is alert and oriented to person, place, and time.    Diabetic Foot Exam - Simple   Simple Foot Form Diabetic Foot exam was performed with the following findings: Yes 05/03/2020 10:30 AM  Visual Inspection No deformities, no ulcerations, no other skin breakdown bilaterally: Yes Sensation Testing Intact to touch and monofilament testing bilaterally:  Yes Pulse Check Posterior Tibialis and Dorsalis pulse intact bilaterally: Yes Comments     BP 120/60 (BP Location: Right Arm, Patient Position: Sitting, Cuff Size: Large)   Pulse 67   Temp 97.9 F (36.6 C) (Oral)   Resp 18   Ht 5\' 2"  (1.575 m)   Wt 198 lb 12.8 oz (90.2 kg)   SpO2 93%   BMI 36.36 kg/m  Wt Readings from Last 3 Encounters:  05/03/20 198 lb 12.8 oz (90.2 kg)  02/28/20 208 lb 6.4 oz (94.5 kg)  01/31/20 209 lb 9.6 oz (95.1 kg)     Lab Results  Component Value Date   WBC 7.3 01/31/2020   HGB 12.1 01/31/2020   HCT 35.5 (L) 01/31/2020   PLT 206.0 01/31/2020   GLUCOSE 142 (H) 02/28/2020   CHOL 164 01/31/2020   TRIG  182.0 (H) 01/31/2020   HDL 38.20 (L) 01/31/2020   LDLDIRECT 149.6 01/26/2013   LDLCALC 89 01/31/2020   ALT 11 01/31/2020   AST 13 01/31/2020   NA 137 02/28/2020   K 4.6 02/28/2020   CL 104 02/28/2020   CREATININE 1.34 (H) 02/28/2020   BUN 26 (H) 02/28/2020   CO2 27 02/28/2020   TSH 2.11 01/31/2020   INR 0.96 02/17/2017   HGBA1C 7.4 (A) 02/28/2020   MICROALBUR 0.6 03/28/2016    DG Lumbar Spine Complete  Result Date: 02/01/2020 CLINICAL DATA:  Back pain for several weeks, no known injury, initial encounter EXAM: LUMBAR SPINE - COMPLETE 4+ VIEW COMPARISON:  None. FINDINGS: Five lumbar type vertebral bodies are well visualized. Vertebral body height is well maintained. Multilevel facet hypertrophic changes and disc space narrowing is seen. Mild osteophytic changes are noted. No anterolisthesis is seen. No pars defects are noted. Aortic calcifications are seen without aneurysmal dilatation. IMPRESSION: Multilevel degenerative change without acute abnormality. Electronically Signed   By: Inez Catalina M.D.   On: 02/01/2020 09:14   MM 3D SCREEN BREAST BILATERAL  Result Date: 02/01/2020 CLINICAL DATA:  Screening. EXAM: DIGITAL SCREENING BILATERAL MAMMOGRAM WITH TOMO AND CAD COMPARISON:  Previous exam(s). ACR Breast Density Category b: There are  scattered areas of fibroglandular density. FINDINGS: There are no findings suspicious for malignancy. Images were processed with CAD. IMPRESSION: No mammographic evidence of malignancy. A result letter of this screening mammogram will be mailed directly to the patient. RECOMMENDATION: Screening mammogram in one year. (Code:[id number]-B-01Y) BI-RADS CATEGORY  1: Negative. Electronically Signed   By: Lovey Newcomer M.D.   On: 02/01/2020 14:51   DG HIPS BILAT W OR W/O PELVIS 2V  Result Date: 02/01/2020 CLINICAL DATA:  Bilateral hip pain for several weeks, no known injury, initial encounter EXAM: DG HIP (WITH OR WITHOUT PELVIS) 2V BILAT COMPARISON:  None. FINDINGS: Pelvic ring is intact. Mild degenerative changes of the hip joints are seen. No acute fracture or dislocation is noted. No soft tissue abnormality is seen. IMPRESSION: Mild degenerative change without acute abnormality. Electronically Signed   By: Inez Catalina M.D.   On: 02/01/2020 09:12     Assessment & Plan:  Plan  I am having Bethany Miller maintain her Multiple Vitamins-Minerals (MULTIVITAMIN ADULT PO), Loratadine-Pseudoephedrine (CLARITIN-D 12 HOUR PO), ibuprofen, melatonin, acetaminophen, docusate sodium, aspirin EC, accu-chek multiclix, glucose blood, Insulin Pen Needle, Insulin Admin Supplies, omeprazole, tiZANidine, fenofibrate, Lantus SoloStar, glipiZIDE, metFORMIN, lisinopril-hydrochlorothiazide, citalopram, atorvastatin, traZODone, and meloxicam.  Meds ordered this encounter  Medications  . traZODone (DESYREL) 50 MG tablet    Sig: TAKE 1/2 TO 1 (ONE-HALF TO ONE) TABLET BY MOUTH AT BEDTIME AS NEEDED FOR  SLEEP    Dispense:  90 tablet    Refill:  1  . meloxicam (MOBIC) 7.5 MG tablet    Sig: Take 1 tablet (7.5 mg total) by mouth daily.    Dispense:  90 tablet    Refill:  1    Problem List Items Addressed This Visit      Unprioritized   Depression with anxiety    stable      Relevant Medications   traZODone (DESYREL) 50 MG tablet    DM (diabetes mellitus) type II uncontrolled, periph vascular disorder ([place])    Per endo       ESSENTIAL HYPERTENSION, BENIGN    Well controlled, no changes to meds. Encouraged heart healthy diet such as the DASH diet and exercise as tolerated.  con't lisinopril  Hyperlipidemia associated with type 2 diabetes mellitus ([place]) - Primary   Relevant Orders   Lipid panel   Comprehensive metabolic panel   Hyperlipidemia LDL goal <70    Tolerating statin, encouraged heart healthy diet, avoid trans fats, minimize simple carbs and saturated fats. Increase exercise as tolerated  con't lipitor / fenofibrate      INSOMNIA   Relevant Medications   traZODone (DESYREL) 50 MG tablet   low back pain with radiation   Relevant Medications   traZODone (DESYREL) 50 MG tablet   meloxicam (MOBIC) 7.5 MG tablet   Uncontrolled type 2 diabetes mellitus with hyperglycemia ([facility])   Relevant Orders   Lipid panel   Comprehensive metabolic panel    Other Visit Diagnoses    Primary hypertension       Relevant Orders   Lipid panel   Comprehensive metabolic panel   Need for influenza vaccination       Relevant Orders   Flu Vaccine QUAD High Dose(Fluad) (Completed)   Colon cancer screening       Relevant Orders   Ambulatory referral to Gastroenterology      Follow-up: Return in about 6 months (around 11/01/2020), or if symptoms worsen or fail to improve, for hypertension, hyperlipidemia.  Ann Held, DO

## 2020-05-03 NOTE — Assessment & Plan Note (Addendum)
Well controlled, no changes to meds. Encouraged heart healthy diet such as the DASH diet and exercise as tolerated.  con't lisinopril 

## 2020-05-03 NOTE — Assessment & Plan Note (Addendum)
Tolerating statin, encouraged heart healthy diet, avoid trans fats, minimize simple carbs and saturated fats. Increase exercise as tolerated  con't lipitor / fenofibrate

## 2020-05-07 ENCOUNTER — Other Ambulatory Visit: Payer: Self-pay | Admitting: Family Medicine

## 2020-05-07 DIAGNOSIS — E1169 Type 2 diabetes mellitus with other specified complication: Secondary | ICD-10-CM

## 2020-05-09 ENCOUNTER — Other Ambulatory Visit: Payer: Self-pay

## 2020-05-09 DIAGNOSIS — E1169 Type 2 diabetes mellitus with other specified complication: Secondary | ICD-10-CM

## 2020-05-10 ENCOUNTER — Encounter: Payer: Self-pay | Admitting: Family Medicine

## 2020-05-29 ENCOUNTER — Encounter: Payer: Self-pay | Admitting: General Practice

## 2020-06-12 ENCOUNTER — Encounter: Payer: Self-pay | Admitting: Gastroenterology

## 2020-07-18 ENCOUNTER — Encounter: Payer: Self-pay | Admitting: Family Medicine

## 2020-07-18 DIAGNOSIS — Z1211 Encounter for screening for malignant neoplasm of colon: Secondary | ICD-10-CM

## 2020-07-19 NOTE — Telephone Encounter (Signed)
She should be she should double check that her ins will pay for it -- if she knows it will ok to order test

## 2020-07-20 ENCOUNTER — Other Ambulatory Visit: Payer: Self-pay

## 2020-07-20 DIAGNOSIS — Z1211 Encounter for screening for malignant neoplasm of colon: Secondary | ICD-10-CM

## 2020-07-27 ENCOUNTER — Other Ambulatory Visit: Payer: Self-pay | Admitting: Family Medicine

## 2020-07-27 ENCOUNTER — Encounter: Payer: Self-pay | Admitting: Family Medicine

## 2020-07-27 MED ORDER — FENOFIBRATE 160 MG PO TABS: 160.0000 mg | ORAL_TABLET | Freq: Every day | ORAL | 1 refills | Status: DC

## 2020-07-30 ENCOUNTER — Ambulatory Visit: Payer: Medicare Other | Admitting: Internal Medicine

## 2020-08-05 DIAGNOSIS — Z1211 Encounter for screening for malignant neoplasm of colon: Secondary | ICD-10-CM | POA: Diagnosis not present

## 2020-08-05 LAB — COLOGUARD: Cologuard: NEGATIVE

## 2020-08-10 ENCOUNTER — Other Ambulatory Visit: Payer: Self-pay

## 2020-08-14 ENCOUNTER — Ambulatory Visit: Payer: Medicare Other | Admitting: Internal Medicine

## 2020-08-14 ENCOUNTER — Encounter: Payer: Medicare Other | Admitting: Gastroenterology

## 2020-08-31 ENCOUNTER — Ambulatory Visit (INDEPENDENT_AMBULATORY_CARE_PROVIDER_SITE_OTHER): Payer: Medicare Other | Admitting: Internal Medicine

## 2020-08-31 ENCOUNTER — Other Ambulatory Visit: Payer: Self-pay

## 2020-08-31 ENCOUNTER — Encounter: Payer: Self-pay | Admitting: Internal Medicine

## 2020-08-31 VITALS — BP 120/78 | HR 65 | Ht 62.0 in | Wt 195.4 lb

## 2020-08-31 DIAGNOSIS — N1832 Chronic kidney disease, stage 3b: Secondary | ICD-10-CM | POA: Diagnosis not present

## 2020-08-31 DIAGNOSIS — E1122 Type 2 diabetes mellitus with diabetic chronic kidney disease: Secondary | ICD-10-CM | POA: Diagnosis not present

## 2020-08-31 DIAGNOSIS — E669 Obesity, unspecified: Secondary | ICD-10-CM

## 2020-08-31 DIAGNOSIS — E785 Hyperlipidemia, unspecified: Secondary | ICD-10-CM

## 2020-08-31 LAB — POCT GLYCOSYLATED HEMOGLOBIN (HGB A1C): Hemoglobin A1C: 8.1 % — AB (ref 4.0–5.6)

## 2020-08-31 MED ORDER — LANTUS SOLOSTAR 100 UNIT/ML ~~LOC~~ SOPN: 12.0000 [IU] | PEN_INJECTOR | Freq: Every day | SUBCUTANEOUS | 11 refills | Status: DC

## 2020-08-31 MED ORDER — GLIPIZIDE 5 MG PO TABS: 5.0000 mg | ORAL_TABLET | Freq: Two times a day (BID) | ORAL | 3 refills | Status: DC

## 2020-08-31 MED ORDER — METFORMIN HCL ER 500 MG PO TB24: 500.0000 mg | ORAL_TABLET | Freq: Two times a day (BID) | ORAL | 3 refills | Status: DC

## 2020-08-31 MED ORDER — INSULIN PEN NEEDLE 32G X 4 MM MISC: 3 refills | Status: DC

## 2020-08-31 NOTE — Patient Instructions (Addendum)
Please change: - Metformin ER 500 mg 2x a day   Decrease: - Glipizide 5 mg before breakfast and before a larger dinner  Add: - Lantus 10-12 units at bedtime  Please schedule an appt with Antonieta Iba with nutrition.  Try to see if you can get Trulicity - start at 5.91 mg weekly.  Please return in 3 months with your sugar log.

## 2020-08-31 NOTE — Progress Notes (Signed)
Patient ID: Bethany Miller, female   DOB: September 24, 1945, 75 y.o.   MRN: [id number]  This visit occurred during the SARS-CoV-2 public health emergency.  Safety protocols were in place, including screening questions prior to the visit, additional usage of staff PPE, and extensive cleaning of exam room while observing appropriate contact time as indicated for disinfecting solutions.   HPI: Bethany Miller is a 75 y.o.-year-old female, presenting for f/u for DM2, dx 2010, non-insulin-dependent, uncontrolled, with complications (CKD). Last visit 1 year and 9 months ago (virtual).  Since last OV, she saw Dr. Kelton Pillar. She would like to return to see me. he has [employer] part D for meds, [employer], and [employer] for the rest.   She had Covid19 last month - still has some fatigue.  Reviewed HbA1c levels: Lab Results  Component Value Date   HGBA1C 7.4 (A) 02/28/2020   HGBA1C 9.2 (H) 10/06/2019   HGBA1C 8.6 (H) 04/01/2019    Pt is on a regimen of: - Metformin ER 1000 mg 2x a day (2000 mg with dinner did not change the sugars) >> 1000 mg with breakfast - Glipizide 5 >> 10 mg 2x a day before breakfast and dinner - Lantus 12 units >> 10-14 units at night >> stopped 02/2020 by Dr. Kelton Pillar We tried Ozempic but she developed nausea and vomiting and ended up in the emergency room in 11/2018. We again tried [place]  - added again 04/2018 >> not affordable Stopped  Glipizide 5 mg 2x a day 06/2017 b/c lows, but restarted afterwards. Tradjenta and Alogliptin were not covered She was previously on Invokana, Jardiance 10 mg daily >> too expensive. Tried Cycloset >> too expensive Tried Januvia >>  too expensive.  Tried Metformin 500 mg po >> severe diarrhea. She was on Glimepiride >> hypoglycemia in the 60's on 2 mg daily. She had rapid acting insulin with steroid shots before.   Pt checks her sugars twice a day: - am: 189-200s >> 85x1, 120-285, 300 >> 141, 200s >> 140-202 - 2h after b'fast: 97, 164,  173 >> n/c - Lunch:  low 100s >> n/c >> 163 >> n/c - 2h after lunch: 160 >> n/c - Dinnertime:  83, 103 >> 57, 143-167 >> n/c >> 72 (06/2020) - 2h after dinner: 200s >> 128-250 >> 99, 200 >> 97-130 Lowest sugar was 56 x1 >> ...85 >> 99 >> 72;  she has hypoglycemia awareness in the 90s. Highest sugar was 321 >> 300 >> 300 >> 200s.  ReliOn meter.  -+ CKD, last BUN/creatinine:  Lab Results  Component Value Date   BUN 22 05/03/2020   CREATININE 1.51 (H) 05/03/2020   No MAU: Lab Results  Component Value Date   MICRALBCREAT 4 03/28/2016   MICRALBCREAT 0.6 11/16/2015   MICRALBCREAT 0.6 01/26/2013   MICRALBCREAT 0.3 03/24/2012   MICRALBCREAT 0.4 03/18/2011   MICRALBCREAT 11.4 10/29/2009   MICRALBCREAT 3.4 04/09/2009   MICRALBCREAT 17.1 01/25/2009   GFR: Lab Results  Component Value Date   GFRNONAA 29 (L) 11/25/2018   GFRNONAA 45 (L) 04/21/2017   GFRNONAA 54 (L) 02/27/2017   GFRNONAA 35 (L) 02/17/2017   GFRNONAA 52.79 (L) 06/19/2010   GFRNONAA 59.68 10/29/2009   GFRNONAA 53.52 07/03/2009   GFRNONAA 59.78 04/09/2009   GFRNONAA 59.82 01/25/2009   GFRNONAA 78 05/08/2008  On Lisinopril 10.  -+ HL; last set of lipids: Lab Results  Component Value Date   CHOL 181 05/03/2020   HDL 36 (L) 05/03/2020   LDLCALC 108 (  H) 05/03/2020   LDLDIRECT 149.6 01/26/2013   TRIG 247 (H) 05/03/2020   CHOLHDL 5.0 (H) 05/03/2020  On Lipitor 40, Fenofibrate 160. - last eye exam was in 04/2019: No DR -denies numbness and tingling in her feet.  She sees podiatry for ingrown toenails.  She also has a history of HTN. 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  ROS: Constitutional: no weight gain/no weight loss, no fatigue, no subjective hyperthermia, no subjective hypothermia Eyes: no blurry vision, no xerophthalmia ENT: no sore throat, no nodules palpated in neck, no dysphagia, no odynophagia, no hoarseness Cardiovascular: no CP/no SOB/no palpitations/no leg swelling Respiratory: no cough/no SOB/no  wheezing Gastrointestinal: no N/no V/no D/no C/no acid reflux Musculoskeletal: no muscle aches/no joint aches Skin: no rashes, no hair loss Neurological: no tremors/no numbness/no tingling/no dizziness  I reviewed pt's medications, allergies, PMH, social hx, family hx, and changes were documented in the history of present illness. Otherwise, unchanged from my initial visit note.  Past Medical History:  Diagnosis Date  . Diabetes mellitus   . Hyperlipidemia   . Hypertension   . Murmur    Past Surgical History:  Procedure Laterality Date  . ABDOMINAL HYSTERECTOMY    . BACK SURGERY    . CHOLECYSTECTOMY    . TONSILLECTOMY    . TOTAL KNEE ARTHROPLASTY    . TOTAL KNEE ARTHROPLASTY Right 02/26/2017   Procedure: RIGHT TOTAL KNEE ARTHROPLASTY;  Surgeon: Susa Day, MD;  Location: [facility];  Service: Orthopedics;  Laterality: Right;   Social History   Socioeconomic History  . Marital status: Widowed    Spouse name: Not on file  . Number of children: Not on file  . Years of education: Not on file  . Highest education level: Not on file  Occupational History  . Not on file  Tobacco Use  . Smoking status: Never Smoker  . Smokeless tobacco: Never Used  Substance and Sexual Activity  . Alcohol use: Yes    Comment: rare  . Drug use: No  . Sexual activity: Not Currently    Partners: Male  Other Topics Concern  . 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 acetaminophen (TYLENOL) 650 MG CR tablet Take 650 mg by mouth every 8 (eight) hours as needed for  pain.    Marland Kitchen aspirin EC 81 MG tablet Take 81 mg by mouth daily.    Marland Kitchen atorvastatin (LIPITOR) 40 MG tablet TAKE 1 TABLET EVERY DAY 90 tablet 1  . citalopram (CELEXA) 40 MG tablet TAKE 1 TABLET EVERY DAY 90 tablet 3  . docusate sodium (COLACE) 100 MG capsule Take 1 capsule (100 mg total) by mouth 2 (two) times daily as needed for mild constipation. 30 capsule 1  . fenofibrate 160 MG tablet Take 1 tablet (160 mg total) by mouth daily. 90 tablet 1  . glipiZIDE (GLUCOTROL) 10 MG tablet Take 1 tablet (10 mg total) by mouth 2 (two) times daily before a meal. 180 tablet 1  . glucose blood (ACCU-CHEK GUIDE) test strip USE 1  STRIP TO CHECK GLUCOSE ONCE DAILY 100 each 4  . ibuprofen (ADVIL,MOTRIN) 800 MG tablet Take 800 mg by mouth 3 (three) times daily as needed for moderate pain.    . Insulin Admin Supplies MISC Use to inject insulin 2 times a day. 180 each 0  . insulin glargine (LANTUS SOLOSTAR) 100 UNIT/ML Solostar Pen Inject 10-20 Units into the skin daily. Inject 10-20 units under the skin once daily at bedtime.    . Insulin Pen Needle 32G X 4 MM MISC Use 1x a day 100 each 3  . Lancets (ACCU-CHEK MULTICLIX) lancets Use as instructed to test once a day DX E11.51 100 each 1  . lisinopril-hydrochlorothiazide (ZESTORETIC) 10-12.5 MG tablet TAKE 1 TABLET EVERY DAY 90 tablet 1  . Loratadine-Pseudoephedrine (CLARITIN-D 12 HOUR PO) Take 1 tablet by mouth daily as needed (sinus).     . Melatonin 3 MG TABS Take 8 mg by mouth at bedtime as needed (sleep).     . meloxicam (MOBIC) 7.5 MG tablet Take 1 tablet (7.5 mg total) by mouth daily. 90 tablet 1  . metFORMIN (GLUCOPHAGE-XR) 500 MG 24 hr tablet Take 2 tablets (1,000 mg total) by mouth daily with breakfast. 180 tablet 3  . Multiple Vitamins-Minerals (MULTIVITAMIN ADULT PO) Take 1 tablet by mouth daily.     Marland Kitchen omeprazole (PRILOSEC) 20 MG capsule Take 1 capsule (20 mg total) by mouth daily. 90 capsule 3  . tiZANidine (ZANAFLEX) 4 MG tablet Take 1 tablet (4 mg total)  by mouth every 6 (six) hours as needed for muscle spasms. 30 tablet 0  . traZODone (DESYREL) 50 MG tablet TAKE 1/2 TO 1 (ONE-HALF TO ONE) TABLET BY MOUTH AT BEDTIME AS NEEDED FOR  SLEEP 90 tablet 1   No current facility-administered medications on file prior to visit.   Allergies  Allergen Reactions  . Ozempic (0.25 Or 0.5 Mg-Dose) [Semaglutide(0.25 Or 0.5mg -Dos)] Diarrhea and Nausea And Vomiting    Pt stated, "I got dehydrated from this as well"   Family History  Problem Relation Age of Onset  . Throat cancer Brother   . Heart disease Brother        MI  . Cancer Brother 80       throat,   . Hyperlipidemia Brother   . Cirrhosis Father   . Alcohol abuse Father   . Heart disease Brother   . Cancer Brother   . Arthritis Mother   . Hypertension Sister   . Arthritis Sister   . Alzheimer's disease Sister   . Cancer Brother   . Cancer Brother 59       melanoma  . Hypertension Brother   . Hyperlipidemia Brother   . Melanoma Other   . Arthritis Other    PE: BP 120/78   Pulse 65   Ht 5\' 2"  (1.575 m)   Wt 195 lb 6.4 oz (88.6 kg)   SpO2 95%   BMI 35.74 kg/m  Wt Readings from Last 3 Encounters:  08/31/20 195 lb 6.4 oz (88.6 kg)  05/03/20 198 lb 12.8 oz (90.2 kg)  02/28/20 208 lb 6.4 oz (94.5 kg)   Constitutional: overweight, in NAD Eyes: PERRLA, EOMI, no exophthalmos ENT: moist mucous membranes, no thyromegaly, no cervical lymphadenopathy Cardiovascular: RRR, No MRG Respiratory: CTA B Gastrointestinal: abdomen soft, NT, ND, BS+ Musculoskeletal: no deformities, strength intact in all 4 Skin: moist, warm, no rashes Neurological: no tremor with outstretched hands, DTR normal in all 4  ASSESSMENT: 1. DM2, non-insulin-dependent, uncontrolled, with long-term  complications: - CKD  2. HL   3. obesity class II  PLAN:  1. Patient with history of uncontrolled diabetes returns after long absence of almost 2 years.  At last visit, she was, on oral antidiabetic regimen with  Metformin, glipizide, but came off SGLT2 inhibitor due to price.  Of note, we tried to add a GLP-1 receptor agonist but she developed severe nausea and vomiting and had to go to the emergency room where she was hydrated.  Lipase was not elevated.  At last visit, we added long-acting insulin and I advised her how to titrate the dose up.  HbA1c was 10.4%.  She started insulin and sugars improved.  Also, her HbA1c gradually improved to 7.4% in 02/2020.  At that time, she decided to start seeing Dr. Kelton Pillar in the [facility] office, closer to home.  Dr. Kelton Pillar stopped Lantus, but sugars started to increase afterwards and remained higher than target.  - At today's visit, she is off the insulin and uses a lower dose of Metformin and a higher dose of glipizide than recommended at last visit.  Despite this, sugars are above target.  We discussed about different options for treatment and we decided to add back insulin for now.  However, in the meantime, she would be interested in restarting a GLP-1 receptor agonist, possibly Trulicity-given patient assistance forms.  However, I did advise her to check with her insurance to see if this is covered first.  We will start a low-dose to see how she tolerates it.  I also advised her to split Metformin into 2 doses as she now takes the entire dose in the morning.  To avoid low blood sugars, advised her to back off the glipizide from 10 mg twice a day to 5 mg in a.m. and only as needed 5 mg before a larger meal. -She is also interested in a referral to nutrition, which I placed today - I suggested to:  Patient Instructions  Please change: - Metformin ER 500 mg 2x a day   Decrease: - Glipizide 5 mg before breakfast and before a larger dinner  Add: - Lantus 10-12 units at bedtime  Please schedule an appt with Antonieta Iba with nutrition.  Try to see if you can get Trulicity - start at 9.67 mg weekly.  Please return in 3 months with your sugar log.   - we  checked her HbA1c: 8.1% (higher) - advised to check sugars at different times of the day - 1x a day, rotating check times - advised for yearly eye exams >> she is not UTD - return to clinic in 3 months     2. HL -Reviewed latest lipid panel from 04/2020: All fractions abnormal: Lab Results  Component Value Date   CHOL 181 05/03/2020   HDL 36 (L) 05/03/2020   LDLCALC 108 (H) 05/03/2020   LDLDIRECT 149.6 01/26/2013   TRIG 247 (H) 05/03/2020   CHOLHDL 5.0 (H) 05/03/2020  -Continues Lipitor 40 without side effects  3.  Obesity -Could not tolerate Ozempic due to nausea and vomiting -She is interested in retrying Trulicity, I am hoping that she can obtain this-this should also help with weight loss -She lost 13 pounds in the last 6 months  Philemon Kingdom, MD PhD [facility] Endocrinology

## 2020-09-17 ENCOUNTER — Encounter: Payer: Self-pay | Admitting: Family Medicine

## 2020-09-17 ENCOUNTER — Ambulatory Visit (INDEPENDENT_AMBULATORY_CARE_PROVIDER_SITE_OTHER): Payer: Medicare Other | Admitting: Family Medicine

## 2020-09-17 ENCOUNTER — Other Ambulatory Visit: Payer: Self-pay

## 2020-09-17 VITALS — BP 120/68 | HR 67 | Temp 97.7°F | Resp 18 | Ht 62.0 in | Wt 196.2 lb

## 2020-09-17 DIAGNOSIS — E785 Hyperlipidemia, unspecified: Secondary | ICD-10-CM

## 2020-09-17 DIAGNOSIS — J0140 Acute pansinusitis, unspecified: Secondary | ICD-10-CM | POA: Diagnosis not present

## 2020-09-17 DIAGNOSIS — H9313 Tinnitus, bilateral: Secondary | ICD-10-CM

## 2020-09-17 DIAGNOSIS — R300 Dysuria: Secondary | ICD-10-CM | POA: Insufficient documentation

## 2020-09-17 DIAGNOSIS — I10 Essential (primary) hypertension: Secondary | ICD-10-CM

## 2020-09-17 LAB — POC URINALSYSI DIPSTICK (AUTOMATED)
Bilirubin, UA: NEGATIVE
Blood, UA: NEGATIVE
Glucose, UA: NEGATIVE
Ketones, UA: NEGATIVE
Leukocytes, UA: NEGATIVE
Nitrite, UA: NEGATIVE
Protein, UA: NEGATIVE
Spec Grav, UA: 1.025 (ref 1.010–1.025)
Urobilinogen, UA: 0.2 E.U./dL
pH, UA: 5 (ref 5.0–8.0)

## 2020-09-17 MED ORDER — FLUTICASONE PROPIONATE 50 MCG/ACT NA SUSP
2.0000 | Freq: Every day | NASAL | 6 refills | Status: DC
Start: 2020-09-17 — End: 2022-11-18

## 2020-09-17 MED ORDER — AMOXICILLIN-POT CLAVULANATE 875-125 MG PO TABS: 1.0000 | ORAL_TABLET | Freq: Two times a day (BID) | ORAL | 0 refills | Status: DC

## 2020-09-17 NOTE — Assessment & Plan Note (Signed)
abx per orders flonase   

## 2020-09-17 NOTE — Assessment & Plan Note (Signed)
Check urine today 

## 2020-09-17 NOTE — Progress Notes (Signed)
Patient ID: Bethany Miller, female    DOB: 18-Jun-1946  Age: 75 y.o. MRN: [id number]    Subjective:  Subjective  HPI Bethany Miller presents for an office visit today. Bethany Miller complains of bilateral ear pain. Bethany Miller reports hearing a squeaking sounds, but denies hearing any ringing. Bethany Miller notes being dx with COVID recently,  and after Bethany Miller has been developing sinus infections, maxillary sinus pressure, and teeth pain. It has not resolved since it has started. Bethany Miller has been taking Flonase and Claritin to treat the symptoms. The patient denies any abdominal pain, chest pain, SOB, fever, chills, HA, back pain, or vaginal pain at this time.   Review of Systems  Constitutional: Negative for activity change, appetite change, chills, fatigue, fever and unexpected weight change.  HENT: Positive for dental problem, sinus pressure and sinus pain. Negative for ear pain, facial swelling and rhinorrhea.   Eyes: Negative for pain.  Respiratory: Negative for cough and shortness of breath.   Cardiovascular: Negative for chest pain and palpitations.  Gastrointestinal: Negative for abdominal pain, diarrhea and nausea.  Genitourinary: Negative for dysuria, flank pain, frequency, urgency, vaginal bleeding, vaginal discharge and vaginal pain.  Musculoskeletal: Negative for back pain and neck pain.  Skin: Negative for rash.  Neurological: Negative for dizziness and headaches.  Psychiatric/Behavioral: Negative for behavioral problems and dysphoric mood. The patient is not nervous/anxious.     History Past Medical History:  Diagnosis Date  . Diabetes mellitus   . Hyperlipidemia   . Hypertension   . Murmur     Bethany Miller has a past surgical history that includes Total knee arthroplasty; Back surgery; Tonsillectomy; Abdominal hysterectomy; Cholecystectomy; and Total knee arthroplasty (Right, 02/26/2017).   Bethany Miller family history includes Alcohol abuse in Bethany Miller father; Alzheimer's disease in Bethany Miller sister; Arthritis in Bethany Miller mother, sister,  and another family member; Cancer in Bethany Miller brother and brother; Cancer (age of onset: 45) in Bethany Miller brother; Cancer (age of onset: 31) in Bethany Miller brother; Cirrhosis in Bethany Miller father; Heart disease in Bethany Miller brother and brother; Hyperlipidemia in Bethany Miller brother and brother; Hypertension in Bethany Miller brother and sister; Melanoma in an other family member; Throat cancer in Bethany Miller brother.Bethany Miller reports that Bethany Miller has never smoked. Bethany Miller has never used smokeless tobacco. Bethany Miller reports current alcohol use. Bethany Miller reports that Bethany Miller does not use drugs.  Current Outpatient Medications on File Prior to Visit  Medication Sig Dispense Refill  . acetaminophen (TYLENOL) 650 MG CR tablet Take 650 mg by mouth every 8 (eight) hours as needed for pain.    Marland Kitchen aspirin EC 81 MG tablet Take 81 mg by mouth daily.    Marland Kitchen atorvastatin (LIPITOR) 40 MG tablet TAKE 1 TABLET EVERY DAY 90 tablet 1  . citalopram (CELEXA) 40 MG tablet TAKE 1 TABLET EVERY DAY 90 tablet 3  . docusate sodium (COLACE) 100 MG capsule Take 1 capsule (100 mg total) by mouth 2 (two) times daily as needed for mild constipation. 30 capsule 1  . fenofibrate 160 MG tablet Take 1 tablet (160 mg total) by mouth daily. 90 tablet 1  . glipiZIDE (GLUCOTROL) 5 MG tablet Take 1 tablet (5 mg total) by mouth 2 (two) times daily before a meal. 180 tablet 3  . glucose blood (ACCU-CHEK GUIDE) test strip USE 1 STRIP TO CHECK GLUCOSE ONCE DAILY 100 each 4  . ibuprofen (ADVIL,MOTRIN) 800 MG tablet Take 800 mg by mouth 3 (three) times daily as needed for moderate pain.    . Insulin Admin Supplies MISC Use to inject  insulin 2 times a day. 180 each 0  . insulin glargine (LANTUS SOLOSTAR) 100 UNIT/ML Solostar Pen Inject 12 Units into the skin daily. 15 mL 11  . Insulin Pen Needle 32G X 4 MM MISC Use 1x a day 100 each 3  . Lancets (ACCU-CHEK MULTICLIX) lancets Use as instructed to test once a day DX E11.51 100 each 1  . lisinopril-hydrochlorothiazide (ZESTORETIC) 10-12.5 MG tablet TAKE 1 TABLET EVERY DAY 90 tablet 1  .  Loratadine-Pseudoephedrine (CLARITIN-D 12 HOUR PO) Take 1 tablet by mouth daily as needed (sinus).     . meloxicam (MOBIC) 7.5 MG tablet Take 1 tablet (7.5 mg total) by mouth daily. 90 tablet 1  . metFORMIN (GLUCOPHAGE-XR) 500 MG 24 hr tablet Take 1 tablet (500 mg total) by mouth 2 (two) times daily. 180 tablet 3  . Multiple Vitamins-Minerals (MULTIVITAMIN ADULT PO) Take 1 tablet by mouth daily.     Marland Kitchen omeprazole (PRILOSEC) 20 MG capsule Take 1 capsule (20 mg total) by mouth daily. 90 capsule 3  . traZODone (DESYREL) 50 MG tablet TAKE 1/2 TO 1 (ONE-HALF TO ONE) TABLET BY MOUTH AT BEDTIME AS NEEDED FOR  SLEEP 90 tablet 1   No current facility-administered medications on file prior to visit.     Objective:  Objective  Physical Exam Constitutional:      Appearance: Bethany Miller.  HENT:     Right Ear: Tympanic membrane, ear canal and external ear normal. There is no impacted cerumen.     Left Ear: Tympanic membrane, ear canal and external ear normal. There is no impacted cerumen.     Nose: Congestion present.     Right Turbinates: Swollen.     Left Turbinates: Swollen.     Right Sinus: No maxillary sinus tenderness or frontal sinus tenderness.     Left Sinus: Maxillary sinus tenderness and frontal sinus tenderness present.     Mouth/Throat:     Pharynx: No posterior oropharyngeal erythema.  Neck:     Thyroid: No thyromegaly.  Cardiovascular:     Rate and Rhythm: Normal rate and regular rhythm.     Heart sounds: Normal heart sounds. No murmur heard.   Pulmonary:     Effort: Pulmonary effort is normal. No respiratory distress.     Breath sounds: Normal breath sounds. No wheezing.  Musculoskeletal:     Cervical back: Neck supple.  Skin:    General: Skin is warm and dry.  Neurological:     Mental Status: Bethany Miller is alert and oriented to person, place, and time.  Psychiatric:        Mood and Affect: Mood and affect normal.        Behavior: Behavior normal.         Thought Content: Thought content normal.        Judgment: Judgment normal.    BP 120/68 (BP Location: Left Arm, Patient Position: Sitting, Cuff Size: Large)   Pulse 67   Temp 97.7 F (36.5 C) (Oral)   Resp 18   Ht 5\' 2"  (1.575 m)   Wt 196 lb 3.2 oz (89 kg)   SpO2 98%   BMI 35.89 kg/m  Wt Readings from Last 3 Encounters:  09/17/20 196 lb 3.2 oz (89 kg)  08/31/20 195 lb 6.4 oz (88.6 kg)  05/03/20 198 lb 12.8 oz (90.2 kg)     Lab Results  Component Value Date   WBC 7.3 01/31/2020   HGB 12.1 01/31/2020   HCT 35.5 (  L) 01/31/2020   PLT 206.0 01/31/2020   GLUCOSE 226 (H) 05/03/2020   CHOL 181 05/03/2020   TRIG 247 (H) 05/03/2020   HDL 36 (L) 05/03/2020   LDLDIRECT 149.6 01/26/2013   LDLCALC 108 (H) 05/03/2020   ALT 11 05/03/2020   AST 12 05/03/2020   NA 139 05/03/2020   K 4.6 05/03/2020   CL 108 05/03/2020   CREATININE 1.51 (H) 05/03/2020   BUN 22 05/03/2020   CO2 24 05/03/2020   TSH 2.11 01/31/2020   INR 0.96 02/17/2017   HGBA1C 8.1 (A) 08/31/2020   MICROALBUR 0.6 03/28/2016    DG Lumbar Spine Complete  Result Date: 02/01/2020 CLINICAL DATA:  Back pain for several weeks, no known injury, initial encounter EXAM: LUMBAR SPINE - COMPLETE 4+ VIEW COMPARISON:  None. FINDINGS: Five lumbar type vertebral bodies are well visualized. Vertebral body height is well maintained. Multilevel facet hypertrophic changes and disc space narrowing is seen. Mild osteophytic changes are noted. No anterolisthesis is seen. No pars defects are noted. Aortic calcifications are seen without aneurysmal dilatation. IMPRESSION: Multilevel degenerative change without acute abnormality. Electronically Signed   By: Inez Catalina M.D.   On: 02/01/2020 09:14   MM 3D SCREEN BREAST BILATERAL  Result Date: 02/01/2020 CLINICAL DATA:  Screening. EXAM: DIGITAL SCREENING BILATERAL MAMMOGRAM WITH TOMO AND CAD COMPARISON:  Previous exam(s). ACR Breast Density Category b: There are scattered areas of  fibroglandular density. FINDINGS: There are no findings suspicious for malignancy. Images were processed with CAD. IMPRESSION: No mammographic evidence of malignancy. A result letter of this screening mammogram will be mailed directly to the patient. RECOMMENDATION: Screening mammogram in one year. (Code:[id number]-B-01Y) BI-RADS CATEGORY  1: Negative. Electronically Signed   By: Lovey Newcomer M.D.   On: 02/01/2020 14:51   DG HIPS BILAT W OR W/O PELVIS 2V  Result Date: 02/01/2020 CLINICAL DATA:  Bilateral hip pain for several weeks, no known injury, initial encounter EXAM: DG HIP (WITH OR WITHOUT PELVIS) 2V BILAT COMPARISON:  None. FINDINGS: Pelvic ring is intact. Mild degenerative changes of the hip joints are seen. No acute fracture or dislocation is noted. No soft tissue abnormality is seen. IMPRESSION: Mild degenerative change without acute abnormality. Electronically Signed   By: Inez Catalina M.D.   On: 02/01/2020 09:12     Assessment & Plan:  Plan    Meds ordered this encounter  Medications  . fluticasone (FLONASE) 50 MCG/ACT nasal spray    Sig: Place 2 sprays into both nostrils daily.    Dispense:  16 g    Refill:  6  . amoxicillin-clavulanate (AUGMENTIN) 875-125 MG tablet    Sig: Take 1 tablet by mouth 2 (two) times daily.    Dispense:  20 tablet    Refill:  0    Problem List Items Addressed This Visit      Unprioritized   Dysuria    Check urine today      Relevant Orders   POCT Urinalysis Dipstick (Automated)   Hyperlipidemia LDL goal <70    Encouraged heart healthy diet, increase exercise, avoid trans fats, consider a krill oil cap daily      Sinusitis   Relevant Medications   fluticasone (FLONASE) 50 MCG/ACT nasal spray   amoxicillin-clavulanate (AUGMENTIN) 875-125 MG tablet    Other Visit Diagnoses    Tinnitus of both ears    -  Primary   Relevant Orders   Ambulatory referral to ENT   Primary hypertension  Relevant Orders   Lipid panel   Comprehensive metabolic  panel      Follow-up: Return if symptoms worsen or fail to improve.   I,Alexis Bryant,acting as a Education administrator for [employer], DO.,have documented all relevant documentation on the behalf of Ann Held, DO,as directed by  Ann Held, DO while in the presence of [place], DO, have reviewed all documentation for this visit. The documentation on 09/17/20 for the exam, diagnosis, procedures, and orders are all accurate and complete.

## 2020-09-17 NOTE — Patient Instructions (Signed)

## 2020-09-17 NOTE — Assessment & Plan Note (Signed)
Encouraged heart healthy diet, increase exercise, avoid trans fats, consider a krill oil cap daily 

## 2020-09-18 ENCOUNTER — Other Ambulatory Visit: Payer: Self-pay | Admitting: Family Medicine

## 2020-09-18 DIAGNOSIS — N184 Chronic kidney disease, stage 4 (severe): Secondary | ICD-10-CM

## 2020-09-18 LAB — COMPREHENSIVE METABOLIC PANEL
ALT: 16 U/L (ref 0–35)
AST: 18 U/L (ref 0–37)
Albumin: 3.9 g/dL (ref 3.5–5.2)
Alkaline Phosphatase: 47 U/L (ref 39–117)
BUN: 33 mg/dL — ABNORMAL HIGH (ref 6–23)
CO2: 24 mEq/L (ref 19–32)
Calcium: 9.5 mg/dL (ref 8.4–10.5)
Chloride: 103 mEq/L (ref 96–112)
Creatinine, Ser: 1.69 mg/dL — ABNORMAL HIGH (ref 0.40–1.20)
GFR: 29.87 mL/min — ABNORMAL LOW (ref >60.00)
Glucose, Bld: 120 mg/dL — ABNORMAL HIGH (ref 70–99)
Potassium: 4.4 mEq/L (ref 3.5–5.1)
Sodium: 136 mEq/L (ref 135–145)
Total Bilirubin: 0.4 mg/dL (ref 0.2–1.2)
Total Protein: 6.5 g/dL (ref 6.0–8.3)

## 2020-09-18 LAB — LIPID PANEL
Cholesterol: 129 mg/dL (ref 0–200)
HDL: 43.7 mg/dL (ref >39.00)
LDL Cholesterol: 55 mg/dL (ref 0–99)
NonHDL: 85
Total CHOL/HDL Ratio: 3
Triglycerides: 149 mg/dL (ref 0.0–149.0)
VLDL: 29.8 mg/dL (ref 0.0–40.0)

## 2020-10-15 LAB — CBC AND DIFFERENTIAL
HCT: 31 — AB (ref 36–46)
Hemoglobin: 10.1 — AB (ref 12.0–16.0)
Neutrophils Absolute: 5
Platelets: 340 (ref 150–399)
WBC: 6.8

## 2020-10-15 LAB — BASIC METABOLIC PANEL
BUN: 28 — AB (ref 4–21)
CO2: 23 — AB (ref 13–22)
Chloride: 103 (ref 99–108)
Creatinine: 1.5 — AB (ref 0.5–1.1)
Glucose: 136
Potassium: 50 — AB (ref 3.4–5.3)
Sodium: 137 (ref 137–147)

## 2020-10-15 LAB — COMPREHENSIVE METABOLIC PANEL
Albumin: 4 (ref 3.5–5.0)
Calcium: 9.4 (ref 8.7–10.7)
Globulin: 2.1

## 2020-10-15 LAB — CBC: RBC: 3.48 — AB (ref 3.87–5.11)

## 2020-10-15 LAB — PROTIME-INR: Protime: 34 — AB (ref 10.0–13.8)

## 2020-10-15 LAB — IRON,TIBC AND FERRITIN PANEL
%SAT: 17
Iron: 68

## 2020-10-15 LAB — HEPATIC FUNCTION PANEL
ALT: 17 (ref 7–35)
AST: 20 (ref 13–35)
Alkaline Phosphatase: 51 (ref 25–125)
Bilirubin, Total: 0.3

## 2020-10-18 DIAGNOSIS — I129 Hypertensive chronic kidney disease with stage 1 through stage 4 chronic kidney disease, or unspecified chronic kidney disease: Secondary | ICD-10-CM | POA: Diagnosis not present

## 2020-10-18 DIAGNOSIS — M1990 Unspecified osteoarthritis, unspecified site: Secondary | ICD-10-CM | POA: Diagnosis not present

## 2020-10-18 DIAGNOSIS — E785 Hyperlipidemia, unspecified: Secondary | ICD-10-CM | POA: Diagnosis not present

## 2020-10-18 DIAGNOSIS — N184 Chronic kidney disease, stage 4 (severe): Secondary | ICD-10-CM | POA: Diagnosis not present

## 2020-10-18 DIAGNOSIS — E1122 Type 2 diabetes mellitus with diabetic chronic kidney disease: Secondary | ICD-10-CM | POA: Diagnosis not present

## 2020-10-18 DIAGNOSIS — N1832 Chronic kidney disease, stage 3b: Secondary | ICD-10-CM | POA: Diagnosis not present

## 2020-10-19 ENCOUNTER — Other Ambulatory Visit: Payer: Self-pay | Admitting: Internal Medicine

## 2020-10-19 DIAGNOSIS — N1832 Chronic kidney disease, stage 3b: Secondary | ICD-10-CM

## 2020-10-29 ENCOUNTER — Telehealth: Payer: Self-pay

## 2020-10-29 NOTE — Telephone Encounter (Signed)
Fax received from [employer] advising pt enrolled in program until the end of the 2022 calendar year.

## 2020-10-29 NOTE — Telephone Encounter (Signed)
Late entry 10/10/2020: [place] application faxed to [phone]

## 2020-11-01 ENCOUNTER — Ambulatory Visit: Payer: Medicare Other | Admitting: Family Medicine

## 2020-11-04 ENCOUNTER — Encounter: Payer: Self-pay | Admitting: Family Medicine

## 2020-11-05 ENCOUNTER — Other Ambulatory Visit: Payer: Self-pay

## 2020-11-05 DIAGNOSIS — G4700 Insomnia, unspecified: Secondary | ICD-10-CM

## 2020-11-05 MED ORDER — TRAZODONE HCL 50 MG PO TABS: ORAL_TABLET | ORAL | 1 refills | Status: DC

## 2020-11-06 ENCOUNTER — Ambulatory Visit: Payer: Medicare Other | Admitting: Family Medicine

## 2020-11-09 ENCOUNTER — Other Ambulatory Visit: Payer: Self-pay

## 2020-11-09 ENCOUNTER — Encounter: Payer: Medicare Other | Attending: Internal Medicine | Admitting: Dietician

## 2020-11-09 DIAGNOSIS — E1151 Type 2 diabetes mellitus with diabetic peripheral angiopathy without gangrene: Secondary | ICD-10-CM | POA: Diagnosis not present

## 2020-11-09 DIAGNOSIS — E1165 Type 2 diabetes mellitus with hyperglycemia: Secondary | ICD-10-CM | POA: Diagnosis not present

## 2020-11-09 DIAGNOSIS — IMO0002 Reserved for concepts with insufficient information to code with codable children: Secondary | ICD-10-CM

## 2020-11-09 NOTE — Progress Notes (Signed)
Medical Nutrition Therapy  Appointment Start time:  [phone]  Appointment End time:  [phone] Patient is here today alone.  Primary concerns today: Patient would like to lose weight.  She states that she does not eat red meat and is not much of a meat eater.  She loves beans and vegetables. Referral diagnosis: Type 2 diabetes Preferred learning style: no preference indicated Learning readiness: ready, change in progress   NUTRITION ASSESSMENT   Anthropometrics  62" 191 lbs 11/09/2020 per patient 200 lbs 3 weeks ago prior to Trulicity 540 lbs prior to Lantus and gained weight when she started this Goal:  140-150 lbs   Clinical Medical Hx: Type 2 Diabetes, stage 3 CKD Medications: see list to include Metformin and Trulicity.  She stopped the Glipizide (due to concern of low blood sugar) and stopped Lantus (due to weight gain) when she started the Trulicity Labs: J8J 1.9% 08/31/2020 increased from 7.4% 02/28/2020, GFR 29 09/27/2020 Fasting glucose usually around 120, 148 this am after pie last night, glucose 2 hours after a meal around 168.  Lifestyle & Dietary Hx Patient is going to be living alone soon.  She is currently shopping and cooking for herself.  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 She eats smaller meals to help with nausea on Trulicity.   She is not a meat lover.  She eats more beans and vegetables with some chicken and fish and avoids red meat.  Estimated daily fluid intake:  80 oz water daily Supplements: Vitamin D, Vitamin C, zinc, MVI, Vitamin B-12 Sleep: 9 hours Stress / self-care: very good Current average weekly physical activity: walks 2-3 days per week and playing with grandson once a week.  24-Hr Dietary Recall First Meal: egg, toast, occasional grits, occasional canadian bacon OR bagel and PB OR plain cheerios, fruit, 2% milk OR occasional biscuit  (digs out most of the middle), egg, sausage OR banana and PB Snack: apple or clementine or berries Second Meal: (often biggest meal) salad OR leftovers OR Chinese once per week (chicken, green beans, broccoli, small amount of noodles) OR fast food chicken sandwich OR kid's meal at [employer]: similar to am Third Meal: roast, potatoes, carrots, onions, peppers OR stir fry with white rice OR tuna OR cereal OR 1/2 Cheese steak and strawberry pie last night Snack: white cheddar popcorn last night but usually nothing Beverages: water, half and half sweet tea, coffee with sugar free creamers,   diluted juice, occasional regular soda  Estimated Energy Needs Calories: 1400 Protein: 45g   NUTRITION DIAGNOSIS  NB-1.1 Food and nutrition-related knowledge deficit As related to weight, type 2 diabetes, and CKD.  As evidenced by diet hx and patient report.   NUTRITION INTERVENTION  Nutrition education (E-1) on the following topics:  . Benefits of plant based eating on CKD and diabetes . Meal option ideas . Mindful eating . Carbohydrate containing beverage impact on blood glucose . Benefits and goals for exercise . Discussed her current medications and how they act.  Discussed lantus and glipizide as well which patient has chosen not to take.  Handouts Provided Include  How to Thrive:  A Guide for Your Journey with Diabetes by the ADA Meal Plan Card Snack list Diet and Diabetes:  Recipe for success from pcrm.org Mediterranean diet handout Diabetes Resource sheet   Learning Style & Readiness for Change  Teaching method utilized: Optician, dispensing  Demonstrated degree of understanding via: Teach Back  Barriers to learning/adherence to lifestyle change: none  Goals Established by Pt Mindfulness  Choices  Eat slowly  Stop when satisfied Before you eat a snack ask, "Am I hungry or eating for another reason?"   Rethink what you drink  Aim to be active most days (150 minutes or more  weekly)  Increase plants and small portions of meat (beef, chicken, fish, pork)  if you eat this.  It is best to avoid red meat.   MONITORING & EVALUATION Dietary intake, weekly physical activity, and habits prn.  Next Steps  Patient is to call as needed for questions and follow up with the type 2 diabetes support group as desired.  She has been added to the mailing list.

## 2020-11-09 NOTE — Patient Instructions (Addendum)
Mindfulness  Choices  Eat slowly  Stop when satisfied Before you eat a snack ask, "Am I hungry or eating for another reason?"   Rethink what you drink  Aim to be active most days (150 minutes or more weekly)  Increase plants and small portions of meat (beef, chicken, fish, pork)  if you eat this.  It is best to avoid red meat.

## 2020-11-12 ENCOUNTER — Ambulatory Visit
Admission: RE | Admit: 2020-11-12 | Discharge: 2020-11-12 | Disposition: A | Discharge status: Home / Self Care | Payer: Medicare Other | Source: Ambulatory Visit | Attending: Internal Medicine | Admitting: Internal Medicine

## 2020-11-12 DIAGNOSIS — N1832 Chronic kidney disease, stage 3b: Secondary | ICD-10-CM

## 2020-11-13 ENCOUNTER — Other Ambulatory Visit: Payer: Self-pay

## 2020-11-13 ENCOUNTER — Encounter: Payer: Self-pay | Admitting: Family Medicine

## 2020-11-13 ENCOUNTER — Ambulatory Visit (INDEPENDENT_AMBULATORY_CARE_PROVIDER_SITE_OTHER): Payer: Medicare Other | Admitting: Family Medicine

## 2020-11-13 DIAGNOSIS — E1151 Type 2 diabetes mellitus with diabetic peripheral angiopathy without gangrene: Secondary | ICD-10-CM | POA: Diagnosis not present

## 2020-11-13 DIAGNOSIS — E1165 Type 2 diabetes mellitus with hyperglycemia: Secondary | ICD-10-CM

## 2020-11-13 DIAGNOSIS — E785 Hyperlipidemia, unspecified: Secondary | ICD-10-CM | POA: Diagnosis not present

## 2020-11-13 DIAGNOSIS — F32A Depression, unspecified: Secondary | ICD-10-CM | POA: Diagnosis not present

## 2020-11-13 DIAGNOSIS — K219 Gastro-esophageal reflux disease without esophagitis: Secondary | ICD-10-CM

## 2020-11-13 DIAGNOSIS — I10 Essential (primary) hypertension: Secondary | ICD-10-CM | POA: Diagnosis not present

## 2020-11-13 DIAGNOSIS — G4700 Insomnia, unspecified: Secondary | ICD-10-CM

## 2020-11-13 DIAGNOSIS — M5441 Lumbago with sciatica, right side: Secondary | ICD-10-CM

## 2020-11-13 DIAGNOSIS — IMO0002 Reserved for concepts with insufficient information to code with codable children: Secondary | ICD-10-CM

## 2020-11-13 MED ORDER — FENOFIBRATE 160 MG PO TABS: 160.0000 mg | ORAL_TABLET | Freq: Every day | ORAL | 1 refills | Status: DC

## 2020-11-13 MED ORDER — ATORVASTATIN CALCIUM 40 MG PO TABS: 1.0000 | ORAL_TABLET | Freq: Every day | ORAL | 1 refills | Status: DC

## 2020-11-13 MED ORDER — MELOXICAM 7.5 MG PO TABS: 7.5000 mg | ORAL_TABLET | Freq: Every day | ORAL | 1 refills | Status: DC

## 2020-11-13 MED ORDER — TRAZODONE HCL 50 MG PO TABS: ORAL_TABLET | ORAL | 1 refills | Status: DC

## 2020-11-13 MED ORDER — CITALOPRAM HYDROBROMIDE 40 MG PO TABS: ORAL_TABLET | ORAL | 3 refills | Status: DC

## 2020-11-13 MED ORDER — LISINOPRIL-HYDROCHLOROTHIAZIDE 10-12.5 MG PO TABS: 1.0000 | ORAL_TABLET | Freq: Every day | ORAL | 1 refills | Status: DC

## 2020-11-13 MED ORDER — OMEPRAZOLE 20 MG PO CPDR: 20.0000 mg | DELAYED_RELEASE_CAPSULE | Freq: Every day | ORAL | 3 refills | Status: DC

## 2020-11-13 NOTE — Assessment & Plan Note (Signed)
Encouraged heart healthy diet, increase exercise, avoid trans fats, consider a krill oil cap daily 

## 2020-11-13 NOTE — Assessment & Plan Note (Signed)
Well controlled, no changes to meds. Encouraged heart healthy diet such as the DASH diet and exercise as tolerated.  °

## 2020-11-13 NOTE — Progress Notes (Signed)
Subjective:   By signing my name below, I, Shehryar Baig, attest that this documentation has been prepared under the direction and in the presence of Dr. Roma Schanz, DO. 11/13/2020      Patient ID: Bethany Miller, female    DOB: 1946/01/15, 75 y.o.   MRN: [id number]  Chief Complaint  Patient presents with  . Hypertension  . Hyperlipidemia  . Follow-up    HPI Patient is in today for a office visit. She reports doing well at this time. Her creatine measurements were good during her nephrologist appointment yesterday on Nov 12, 2020. She is planning to switch to a vegetarian diet to manage her kidney issues. Her dietician said her potassium levels was low. She is requesting a refill on 7.5 mg Meloxicam daily PO.  She denies having any fever, chills, ear pain, congestion, sinus pain, sore throat, eye pain, chest pain, palpations, leg swelling, cough, shortness of breath, wheezing, nausea, vomiting, diarrhea, constipation, blood in stool, dysuria, frequency, hematuria, dizziness, headaches at this time. She has started taking Trulicity to manage her diabetes. She reports that her symptoms have improved. She is UTD on her pneumonia vaccinations. She has received her 3rd Covid-19 vaccination.  Past Medical History:  Diagnosis Date  . Diabetes mellitus   . Hyperlipidemia   . Hypertension   . Murmur     Past Surgical History:  Procedure Laterality Date  . ABDOMINAL HYSTERECTOMY    . BACK SURGERY    . CHOLECYSTECTOMY    . TONSILLECTOMY    . TOTAL KNEE ARTHROPLASTY    . TOTAL KNEE ARTHROPLASTY Right 02/26/2017   Procedure: RIGHT TOTAL KNEE ARTHROPLASTY;  Surgeon: Susa Day, MD;  Location: [facility];  Service: Orthopedics;  Laterality: Right;    Family History  Problem Relation Age of Onset  . Throat cancer Brother   . Heart disease Brother        MI  . Cancer Brother 80       throat,   . Hyperlipidemia Brother   . Cirrhosis Father   . Alcohol abuse Father   . Heart  disease Brother   . Cancer Brother   . Arthritis Mother   . Hypertension Sister   . Arthritis Sister   . Alzheimer's disease Sister   . Cancer Brother   . Cancer Brother 60       melanoma  . Hypertension Brother   . Hyperlipidemia Brother   . Melanoma Other   . Arthritis Other     Social History   Socioeconomic History  . Marital status: Widowed    Spouse name: Not on file  . Number of children: Not on file  . Years of education: Not on file  . Highest education level: Not on file  Occupational History  . Not on file  Tobacco Use  . Smoking status: Never Smoker  . Smokeless tobacco: Never Used  Substance and Sexual Activity  . Alcohol use: Yes    Comment: rare  . Drug use: No  . Sexual activity: Not Currently    Partners: Male  Other Topics Concern  . 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 acetaminophen (TYLENOL) 650 MG CR tablet Take 650 mg by mouth every 8 (eight) hours as needed for pain.    . Ascorbic Acid (VITAMIN C) 100 MG tablet Take 100 mg by mouth daily.    Marland Kitchen aspirin EC 81 MG tablet Take 81 mg by mouth daily.    . cholecalciferol (VITAMIN D3) 25 MCG (1000 UNIT) tablet Take 2,000 Units by mouth daily.    Marland Kitchen docusate sodium (COLACE) 100 MG capsule Take 1 capsule (100 mg total) by mouth 2 (two) times daily as needed for mild constipation. 30 capsule 1  . Dulaglutide (TRULICITY) 3 SJ/6.2EZ SOPN Inject into the skin.    . fluticasone (FLONASE) 50 MCG/ACT nasal spray Place 2 sprays into both nostrils daily. 16 g 6  . glipiZIDE (GLUCOTROL) 5 MG  tablet Take 1 tablet (5 mg total) by mouth 2 (two) times daily before a meal. 180 tablet 3  . glucose blood (ACCU-CHEK GUIDE) test strip USE 1 STRIP TO CHECK GLUCOSE ONCE DAILY 100 each 4  . ibuprofen (ADVIL,MOTRIN) 800 MG tablet Take 800 mg by mouth 3 (three) times daily as needed for moderate pain.    . Insulin Admin Supplies MISC Use to inject insulin 2 times a day. 180 each 0  . insulin glargine (LANTUS SOLOSTAR) 100 UNIT/ML Solostar Pen Inject 12 Units into the skin daily. 15 mL 11  . Insulin Pen Needle 32G X 4 MM MISC Use 1x a day 100 each 3  . Lancets (ACCU-CHEK MULTICLIX) lancets Use as instructed to test once a day DX E11.51 100 each 1  . Loratadine-Pseudoephedrine (CLARITIN-D 12 HOUR PO) Take 1 tablet by mouth daily as needed (sinus).     . metFORMIN (GLUCOPHAGE-XR) 500 MG 24 hr tablet Take 1 tablet (500 mg total) by mouth 2 (two) times daily. 180 tablet 3  . Multiple Vitamins-Minerals (MULTIVITAMIN ADULT PO) Take 1 tablet by mouth daily.     . vitamin B-12 (CYANOCOBALAMIN) 100 MCG tablet Take 100 mcg by mouth daily.    Marland Kitchen zinc gluconate 50 MG tablet Take 50 mg by mouth daily.    Marland Kitchen atorvastatin (LIPITOR) 40 MG tablet TAKE 1 TABLET EVERY DAY 90 tablet 1  . citalopram (CELEXA) 40 MG tablet TAKE 1 TABLET EVERY DAY 90 tablet 3  . fenofibrate 160 MG tablet Take 1 tablet (160 mg total) by mouth daily. 90 tablet 1  . lisinopril-hydrochlorothiazide (ZESTORETIC) 10-12.5 MG tablet TAKE 1 TABLET EVERY DAY 90 tablet 1  . meloxicam (MOBIC) 7.5 MG tablet Take 1 tablet (7.5 mg total) by mouth daily. 90 tablet 1  . omeprazole (PRILOSEC) 20 MG capsule Take 1 capsule (20 mg total) by mouth daily. 90 capsule 3  . traZODone (DESYREL) 50 MG tablet TAKE 1/2 TO 1 (ONE-HALF TO ONE) TABLET BY MOUTH AT BEDTIME AS NEEDED FOR  SLEEP 90 tablet 1  . amoxicillin-clavulanate (AUGMENTIN) 875-125 MG tablet Take 1 tablet by mouth 2 (two) times daily. (Patient not taking: No sig reported) 20 tablet 0   No  facility-administered medications prior to visit.    Allergies  Allergen Reactions  . Ozempic (0.25 Or 0.5 Mg-Dose) [Semaglutide(0.25 Or 0.5mg -Dos)] Diarrhea and Nausea And Vomiting    Pt stated, "I got dehydrated from this as well"    Review of Systems  Constitutional: Negative for chills and fever.  HENT: Negative for congestion, ear pain,  sinus pain and sore throat.   Eyes: Negative for pain.  Respiratory: Negative for cough, shortness of breath and wheezing.   Cardiovascular: Negative for chest pain, palpitations and leg swelling.  Gastrointestinal: Negative for blood in stool, constipation, diarrhea, nausea and vomiting.  Genitourinary: Negative for dysuria, frequency and hematuria.  Neurological: Positive for dizziness. Negative for headaches.       Objective:    Physical Exam Vitals and nursing note reviewed.  Constitutional:      Appearance: Normal appearance.  HENT:     Head: Normocephalic and atraumatic.     Right Ear: External ear normal.     Left Ear: External ear normal.  Eyes:     Extraocular Movements: Extraocular movements intact.     Pupils: Pupils are equal, round, and reactive to light.  Cardiovascular:     Rate and Rhythm: Normal rate and regular rhythm.  Pulmonary:     Effort: Pulmonary effort is normal.     Breath sounds: Normal breath sounds.  Abdominal:     General: Bowel sounds are normal.  Skin:    General: Skin is warm and dry.  Neurological:     Mental Status: She is alert and oriented to person, place, and time.  Psychiatric:        Behavior: Behavior normal.     BP (!) 150/60 (BP Location: Right Arm, Patient Position: Sitting, Cuff Size: Large)   Pulse 66   Temp 98.3 F (36.8 C) (Oral)   Resp 18   Ht 5\' 2"  (1.575 m)   Wt 193 lb (87.5 kg)   SpO2 96%   BMI 35.30 kg/m  Wt Readings from Last 3 Encounters:  11/13/20 193 lb (87.5 kg)  11/09/20 191 lb (86.6 kg)  09/17/20 196 lb 3.2 oz (89 kg)    Diabetic Foot Exam - Simple   No  data filed    Lab Results  Component Value Date   WBC 6.8 10/15/2020   HGB 10.1 (A) 10/15/2020   HCT 31 (A) 10/15/2020   PLT 340 10/15/2020   GLUCOSE 120 (H) 09/17/2020   CHOL 129 09/17/2020   TRIG 149.0 09/17/2020   HDL 43.70 09/17/2020   LDLDIRECT 149.6 01/26/2013   LDLCALC 55 09/17/2020   ALT 17 10/15/2020   AST 20 10/15/2020   NA 137 10/15/2020   K 50.0 (A) 10/15/2020   CL 103 10/15/2020   CREATININE 1.5 (A) 10/15/2020   BUN 28 (A) 10/15/2020   CO2 23 (A) 10/15/2020   TSH 2.11 01/31/2020   INR 0.96 02/17/2017   HGBA1C 8.1 (A) 08/31/2020   MICROALBUR 0.6 03/28/2016    Lab Results  Component Value Date   TSH 2.11 01/31/2020   Lab Results  Component Value Date   WBC 6.8 10/15/2020   HGB 10.1 (A) 10/15/2020   HCT 31 (A) 10/15/2020   MCV 88.3 01/31/2020   PLT 340 10/15/2020   Lab Results  Component Value Date   NA 137 10/15/2020   K 50.0 (A) 10/15/2020   CO2 23 (A) 10/15/2020   GLUCOSE 120 (H) 09/17/2020   BUN 28 (A) 10/15/2020   CREATININE 1.5 (A) 10/15/2020   BILITOT 0.4 09/17/2020   ALKPHOS 51 10/15/2020   AST 20 10/15/2020   ALT 17 10/15/2020   PROT 6.5 09/17/2020   ALBUMIN 4.0 10/15/2020   CALCIUM 9.4 10/15/2020   ANIONGAP 13 11/25/2018   GFR 29.87 (L) 09/17/2020   Lab Results  Component Value Date   CHOL 129 09/17/2020  Lab Results  Component Value Date   HDL 43.70 09/17/2020   Lab Results  Component Value Date   LDLCALC 55 09/17/2020   Lab Results  Component Value Date   TRIG 149.0 09/17/2020   Lab Results  Component Value Date   CHOLHDL 3 09/17/2020   Lab Results  Component Value Date   HGBA1C 8.1 (A) 08/31/2020       Assessment & Plan:   Problem List Items Addressed This Visit      Unprioritized   DM (diabetes mellitus) type II uncontrolled, periph vascular disorder ([place])    hgba1c to be checked , minimize simple carbs. Increase exercise as tolerated. Continue current meds       Relevant Medications   fenofibrate  160 MG tablet   lisinopril-hydrochlorothiazide (ZESTORETIC) 10-12.5 MG tablet   atorvastatin (LIPITOR) 40 MG tablet   ESSENTIAL HYPERTENSION, BENIGN    Well controlled, no changes to meds. Encouraged heart healthy diet such as the DASH diet and exercise as tolerated.       Relevant Medications   fenofibrate 160 MG tablet   lisinopril-hydrochlorothiazide (ZESTORETIC) 10-12.5 MG tablet   atorvastatin (LIPITOR) 40 MG tablet   Hyperlipidemia LDL goal <70    Encouraged heart healthy diet, increase exercise, avoid trans fats, consider a krill oil cap daily       Relevant Medications   fenofibrate 160 MG tablet   lisinopril-hydrochlorothiazide (ZESTORETIC) 10-12.5 MG tablet   atorvastatin (LIPITOR) 40 MG tablet   INSOMNIA   Relevant Medications   traZODone (DESYREL) 50 MG tablet   low back pain with radiation   Relevant Medications   meloxicam (MOBIC) 7.5 MG tablet   traZODone (DESYREL) 50 MG tablet   citalopram (CELEXA) 40 MG tablet    Other Visit Diagnoses    Gastroesophageal reflux disease       Relevant Medications   omeprazole (PRILOSEC) 20 MG capsule   Essential hypertension       Relevant Medications   fenofibrate 160 MG tablet   lisinopril-hydrochlorothiazide (ZESTORETIC) 10-12.5 MG tablet   atorvastatin (LIPITOR) 40 MG tablet   Depression, unspecified depression type       Relevant Medications   traZODone (DESYREL) 50 MG tablet   citalopram (CELEXA) 40 MG tablet       Meds ordered this encounter  Medications  . meloxicam (MOBIC) 7.5 MG tablet    Sig: Take 1 tablet (7.5 mg total) by mouth daily.    Dispense:  90 tablet    Refill:  1  . omeprazole (PRILOSEC) 20 MG capsule    Sig: Take 1 capsule (20 mg total) by mouth daily.    Dispense:  90 capsule    Refill:  3  . fenofibrate 160 MG tablet    Sig: Take 1 tablet (160 mg total) by mouth daily.    Dispense:  90 tablet    Refill:  1  . lisinopril-hydrochlorothiazide (ZESTORETIC) 10-12.5 MG tablet    Sig: Take  1 tablet by mouth daily.    Dispense:  90 tablet    Refill:  1  . traZODone (DESYREL) 50 MG tablet    Sig: TAKE 1/2 TO 1 (ONE-HALF TO ONE) TABLET BY MOUTH AT BEDTIME AS NEEDED FOR  SLEEP    Dispense:  90 tablet    Refill:  1  . citalopram (CELEXA) 40 MG tablet    Sig: TAKE 1 TABLET EVERY DAY    Dispense:  90 tablet    Refill:  3  . atorvastatin (  LIPITOR) 40 MG tablet    Sig: Take 1 tablet (40 mg total) by mouth daily.    Dispense:  90 tablet    Refill:  1    I, Ann Held, DO, personally preformed the services described in this documentation.  All medical record entries made by the scribe were at my direction and in my presence.  I have reviewed the chart and discharge instructions (if applicable) and agree that the record reflects my personal performance and is accurate and complete. 11/13/2020   I,Shehryar Baig,acting as a scribe for Ann Held, DO.,have documented all relevant documentation on the behalf of Ann Held, DO,as directed by  Ann Held, DO while in the presence of Ann Held, DO.   Ann Held, DO

## 2020-11-13 NOTE — Assessment & Plan Note (Signed)
hgba1c to be checked, minimize simple carbs. Increase exercise as tolerated. Continue current meds  

## 2020-11-13 NOTE — Patient Instructions (Signed)

## 2020-12-07 ENCOUNTER — Other Ambulatory Visit: Payer: Self-pay

## 2020-12-07 ENCOUNTER — Encounter: Payer: Self-pay | Admitting: Internal Medicine

## 2020-12-07 ENCOUNTER — Ambulatory Visit (INDEPENDENT_AMBULATORY_CARE_PROVIDER_SITE_OTHER): Payer: Medicare Other | Admitting: Internal Medicine

## 2020-12-07 VITALS — BP 130/72 | HR 68 | Ht 62.0 in | Wt 193.5 lb

## 2020-12-07 DIAGNOSIS — E669 Obesity, unspecified: Secondary | ICD-10-CM

## 2020-12-07 DIAGNOSIS — E1122 Type 2 diabetes mellitus with diabetic chronic kidney disease: Secondary | ICD-10-CM

## 2020-12-07 DIAGNOSIS — E785 Hyperlipidemia, unspecified: Secondary | ICD-10-CM | POA: Diagnosis not present

## 2020-12-07 DIAGNOSIS — N1832 Chronic kidney disease, stage 3b: Secondary | ICD-10-CM

## 2020-12-07 LAB — POCT GLYCOSYLATED HEMOGLOBIN (HGB A1C): Hemoglobin A1C: 7 % — AB (ref 4.0–5.6)

## 2020-12-07 MED ORDER — GLIPIZIDE 5 MG PO TABS: 5.0000 mg | ORAL_TABLET | Freq: Every day | ORAL | 3 refills | Status: DC

## 2020-12-07 NOTE — Addendum Note (Signed)
Addended by: Jacqualin Combes on: 12/07/2020 02:18 PM   Modules accepted: Orders

## 2020-12-07 NOTE — Patient Instructions (Addendum)
Please change: - Metformin ER 1000 mg with dinner  Add: - Glipizide 5 mg 15 min before a large meal  Continue: - Trulicity 5.30 mg weekly  Check sugars 1x a day, rotating checks.  Let me know if sugars in am do not improve.  Please return in 3-4 months with your sugar log.

## 2020-12-07 NOTE — Progress Notes (Signed)
Patient ID: Bethany Miller, female   DOB: 09-03-1945, 75 y.o.   MRN: [id number]  This visit occurred during the SARS-CoV-2 public health emergency.  Safety protocols were in place, including screening questions prior to the visit, additional usage of staff PPE, and extensive cleaning of exam room while observing appropriate contact time as indicated for disinfecting solutions.   HPI: Bethany Miller is a 75 y.o.-year-old female, presenting for f/u for DM2, dx 2010, non-insulin-dependent, uncontrolled, with complications (CKD).  Last visit 3 months ago. She has [employer] part D for meds, [place], and [employer] for the rest.   Interim history: At last visit, she still had some fatigue after having had COVID-19 in 07/2020.  She now recovered well. No increased urination, blurry vision.  She does have a little nausea which started after starting Trulicity, but this is improving with every injection.  Reviewed HbA1c levels: Lab Results  Component Value Date   HGBA1C 8.1 (A) 08/31/2020   HGBA1C 7.4 (A) 02/28/2020   HGBA1C 9.2 (H) 10/06/2019    At last visit she was on: - Metformin ER 1000 mg 2x a day (2000 mg with dinner did not change the sugars) >> 1000 mg with breakfast - Glipizide 5 >> 10 mg 2x a day before breakfast and dinner - >> stopped 02/2020 by Dr. Kelton Pillar We tried Ozempic but she developed nausea and vomiting and ended up in the emergency room in 11/2018. We again tried [place]  - added again 04/2018 >> not affordable Stopped  Glipizide 5 mg 2x a day 06/2017 b/c lows, but restarted afterwards. Tradjenta and Alogliptin were not covered She was previously on Invokana, Jardiance 10 mg daily >> too expensive. Tried Cycloset >> too expensive Tried Januvia >>  too expensive.  Tried Metformin 500 mg po >> severe diarrhea. She was on Glimepiride >> hypoglycemia in the 60's on 2 mg daily. She had rapid acting insulin with steroid shots before.   We changed to: - Metformin ER 500  mg 2x a day  -  - Trulicity 3.81 mg weekly >> started ~7 weeks ago - >> stopped ~7 weeks ago  Pt checks her sugars twice a day: - am: 85x1, 120-285, 300 >> 141, 200s >> 140-202 >> 112-170 - 2h after b'fast: 97, 164, 173 >> n/c - Lunch:  low 100s >> n/c >> 163 >> n/c - 2h after lunch: 160 >> n/c - Dinnertime:  57, 143-167 >> n/c >> 72 (06/2020) >> n/c - 2h after dinner: 128-250 >> 99, 200 >> 97-130 >> n/c Lowest sugar was 56 x1 >> ...72 >> 112;  she has hypoglycemia awareness in the 90s. Highest sugar was 300 >> 200s >> 240 (steak and cheese)  ReliOn meter.  -+ CKD, last BUN/creatinine:  Lab Results  Component Value Date   BUN 28 (A) 10/15/2020   CREATININE 1.5 (A) 10/15/2020   No MAU: Lab Results  Component Value Date   MICRALBCREAT 4 03/28/2016   MICRALBCREAT 0.6 11/16/2015   MICRALBCREAT 0.6 01/26/2013   MICRALBCREAT 0.3 03/24/2012   MICRALBCREAT 0.4 03/18/2011   MICRALBCREAT 11.4 10/29/2009   MICRALBCREAT 3.4 04/09/2009   MICRALBCREAT 17.1 01/25/2009   GFR: Lab Results  Component Value Date   GFRNONAA 29 (L) 11/25/2018   GFRNONAA 45 (L) 04/21/2017   GFRNONAA 54 (L) 02/27/2017   GFRNONAA 35 (L) 02/17/2017   GFRNONAA 52.79 (L) 06/19/2010   GFRNONAA 59.68 10/29/2009   GFRNONAA 53.52 07/03/2009   GFRNONAA 59.78 04/09/2009   GFRNONAA 59.82  01/25/2009   GFRNONAA 78 05/08/2008  On Lisinopril 10.  -+ HL; last set of lipids: Lab Results  Component Value Date   CHOL 129 09/17/2020   HDL 43.70 09/17/2020   LDLCALC 55 09/17/2020   LDLDIRECT 149.6 01/26/2013   TRIG 149.0 09/17/2020   CHOLHDL 3 09/17/2020  On Lipitor 40, Fenofibrate 160.  - last eye exam was in 04/2019: No DR  -denies numbness and tingling in her feet.  She sees podiatry for ingrown toenails.  She also has a history of HTN. 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  ROS: + See HPI Constitutional: no weight gain/no weight loss, no fatigue, no subjective hyperthermia, no subjective hypothermia Eyes: no blurry  vision, no xerophthalmia ENT: no sore throat, no nodules palpated in neck, no dysphagia, no odynophagia, no hoarseness Cardiovascular: no CP/no SOB/no palpitations/no leg swelling Respiratory: no cough/no SOB/no wheezing Gastrointestinal: no N/+ V/no D/no C/no acid reflux Musculoskeletal: no muscle aches/no joint aches Skin: no rashes, no hair loss Neurological: no tremors/no numbness/no tingling/no dizziness  I reviewed pt's medications, allergies, PMH, social hx, family hx, and changes were documented in the history of present illness. Otherwise, unchanged from my initial visit note.  Past Medical History:  Diagnosis Date  . Diabetes mellitus   . Hyperlipidemia   . Hypertension   . Murmur    Past Surgical History:  Procedure Laterality Date  . ABDOMINAL HYSTERECTOMY    . BACK SURGERY    . CHOLECYSTECTOMY    . TONSILLECTOMY    . TOTAL KNEE ARTHROPLASTY    . TOTAL KNEE ARTHROPLASTY Right 02/26/2017   Procedure: RIGHT TOTAL KNEE ARTHROPLASTY;  Surgeon: Susa Day, MD;  Location: [facility];  Service: Orthopedics;  Laterality: Right;   Social History   Socioeconomic History  . Marital status: Widowed    Spouse name: Not on file  . Number of children: Not on file  . Years of education: Not on file  . Highest education level: Not on file  Occupational History  . Not on file  Tobacco Use  . Smoking status: Never Smoker  . Smokeless tobacco: Never Used  Substance and Sexual Activity  . Alcohol use: Yes    Comment: rare  . Drug use: No  . Sexual activity: Not Currently    Partners: Male  Other Topics Concern  . 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 acetaminophen (TYLENOL) 650 MG CR tablet Take 650 mg by mouth every 8 (eight) hours as needed for pain.    . Ascorbic Acid (VITAMIN C) 100 MG tablet Take 100 mg by mouth daily.    Marland Kitchen aspirin EC 81 MG tablet Take 81 mg by mouth daily.    Marland Kitchen atorvastatin (LIPITOR) 40 MG tablet Take 1 tablet (40 mg total) by mouth daily. 90 tablet 1  . cholecalciferol (VITAMIN D3) 25 MCG (1000 UNIT) tablet Take 2,000 Units by mouth daily.    . citalopram (CELEXA) 40 MG tablet TAKE 1 TABLET EVERY DAY 90 tablet 3  . docusate  sodium (COLACE) 100 MG capsule Take 1 capsule (100 mg total) by mouth 2 (two) times daily as needed for mild constipation. 30 capsule 1  . Dulaglutide (TRULICITY) 3 OM/7.6HM SOPN Inject into the skin.    . fenofibrate 160 MG tablet Take 1 tablet (160 mg total) by mouth daily. 90 tablet 1  . fluticasone (FLONASE) 50 MCG/ACT nasal spray Place 2 sprays into both nostrils daily. 16 g 6  . glipiZIDE (GLUCOTROL) 5 MG tablet Take 1 tablet (5 mg total) by mouth 2 (two) times daily before a meal. 180 tablet 3  . glucose blood (ACCU-CHEK GUIDE) test strip USE 1 STRIP TO CHECK GLUCOSE ONCE DAILY 100 each 4  . ibuprofen (ADVIL,MOTRIN) 800 MG tablet Take 800 mg by mouth 3 (three) times daily as needed for moderate pain.    . Insulin Admin Supplies MISC Use to inject insulin 2 times a day. 180 each 0  . insulin glargine (LANTUS SOLOSTAR) 100 UNIT/ML Solostar Pen Inject 12 Units into the skin daily. 15 mL 11  . Insulin Pen Needle 32G X 4 MM MISC Use 1x a day 100 each 3  . Lancets (ACCU-CHEK MULTICLIX) lancets Use as instructed to test once a day DX E11.51 100 each 1  . lisinopril-hydrochlorothiazide (ZESTORETIC) 10-12.5 MG tablet Take 1 tablet by mouth daily. 90 tablet 1  . Loratadine-Pseudoephedrine (CLARITIN-D 12 HOUR PO) Take 1 tablet by mouth daily as needed (sinus).     . meloxicam (MOBIC) 7.5  MG tablet Take 1 tablet (7.5 mg total) by mouth daily. 90 tablet 1  . metFORMIN (GLUCOPHAGE-XR) 500 MG 24 hr tablet Take 1 tablet (500 mg total) by mouth 2 (two) times daily. 180 tablet 3  . Multiple Vitamins-Minerals (MULTIVITAMIN ADULT PO) Take 1 tablet by mouth daily.     Marland Kitchen omeprazole (PRILOSEC) 20 MG capsule Take 1 capsule (20 mg total) by mouth daily. 90 capsule 3  . traZODone (DESYREL) 50 MG tablet TAKE 1/2 TO 1 (ONE-HALF TO ONE) TABLET BY MOUTH AT BEDTIME AS NEEDED FOR  SLEEP 90 tablet 1  . vitamin B-12 (CYANOCOBALAMIN) 100 MCG tablet Take 100 mcg by mouth daily.    Marland Kitchen zinc gluconate 50 MG tablet Take 50 mg by mouth daily.     No current facility-administered medications on file prior to visit.   Allergies  Allergen Reactions  . Ozempic (0.25 Or 0.5 Mg-Dose) [Semaglutide(0.25 Or 0.5mg -Dos)] Diarrhea and Nausea And Vomiting    Pt stated, "I got dehydrated from this as well"   Family History  Problem Relation Age of Onset  . Throat cancer Brother   . Heart disease Brother        MI  . Cancer Brother 80       throat,   . Hyperlipidemia Brother   . Cirrhosis Father   . Alcohol abuse Father   . Heart disease Brother   . Cancer Brother   . Arthritis Mother   . Hypertension Sister   . Arthritis Sister   . Alzheimer's disease Sister   . Cancer Brother   . Cancer Brother 68       melanoma  . Hypertension Brother   . Hyperlipidemia Brother   . Melanoma Other   . Arthritis Other    PE: BP 130/72   Pulse 68   Ht 5\' 2"  (1.575 m)   Wt 193 lb 8 oz (87.8 kg)   SpO2 98%   BMI 35.39 kg/m  Wt Readings from Last 3 Encounters:  12/07/20  193 lb 8 oz (87.8 kg)  11/13/20 193 lb (87.5 kg)  11/09/20 191 lb (86.6 kg)   Constitutional: overweight, in NAD Eyes: PERRLA, EOMI, no exophthalmos ENT: moist mucous membranes, no thyromegaly, no cervical lymphadenopathy Cardiovascular: RRR, No MRG Respiratory: CTA B Gastrointestinal: abdomen soft, NT, ND, BS+ Musculoskeletal: no  deformities, strength intact in all 4 Skin: moist, warm, no rashes Neurological: no tremor with outstretched hands, DTR normal in all 4  ASSESSMENT: 1. DM2, non-insulin-dependent, uncontrolled, with long-term complications: - CKD  2. HL   3. obesity class II  PLAN:  1. Patient with history of uncontrolled diabetes, returning 3 months ago after an absence of almost 2 years.  She was using a lower dose metformin and a higher dose of glipizide but she was taken off insulin by my colleague and she felt that her sugars worsened afterwards.  HbA1c was 8.1%, higher.  At that time, we added Lantus back, we decreased glipizide and, since she was interested in retrying a GLP-1 receptor agonist, I sent a prescription for low-dose Trulicity to her pharmacy.  She could not tolerate Ozempic in the past due to nausea and vomiting. -At this visit, she tells me that she was able to start Trulicity and she continues on the low-dose.  She stopped insulin when starting Trulicity, as she did not want to gain weight.  She did not use glipizide as advised since last visit. -At this visit, she only checks sugars in the morning, and did have increased lately and they are up to 170.  She is not checking sugars later in the day and I strongly advised her to start.  We did discuss about adding back insulin but she would not want to do so yet.  I advised her to move the entire metformin dose with dinner to hopefully improve her morning sugars further.  We also discussed about adding glipizide 5 mg before a larger meal, whether this is lunch or dinner.  For now, states she still has some nausea from Trulicity we will continue with a lower dose but at next visit, I plan to increase it to 1.5 mg weekly.  I advised her to let me know if the sugars in the morning do not improve after these measures, in which case, we will need to start low-dose insulin -At last visit I also referred her to nutrition.  She saw Antonieta Iba. - I  suggested to:  Patient Instructions  Please change: - Metformin ER 1000 mg with dinner  Add: - Glipizide 5 mg 15 min before a large meal  Continue: - Trulicity 9.73 mg weekly  Check sugars 1x a day, rotating checks.  Let me know if sugars in am do not improve.  Please return in 3-4 months with your sugar log.   - we checked her HbA1c: 7.0% (better) - advised to check sugars at different times of the day - 1-2x a day, rotating check times - advised for yearly eye exams >> she is UTD - return to clinic in 3-4 months    2. HL -Reviewed latest lipid panel from 08/2020 and lipids were much better: Lab Results  Component Value Date   CHOL 129 09/17/2020   HDL 43.70 09/17/2020   LDLCALC 55 09/17/2020   LDLDIRECT 149.6 01/26/2013   TRIG 149.0 09/17/2020   CHOLHDL 3 09/17/2020  -Continues Lipitor 40 mg and fenofibrate 160 daily without side effects  3.  Obesity -She could not tolerate Ozempic due to nausea and vomiting. -  At last visit, she was interested in retrying Trulicity.  I sent this to her pharmacy -she is now on a low-dose Trulicity -She lost 13 pounds in the 6 months before last visit -lost 2 lbs since last OV  Philemon Kingdom, MD PhD [facility]

## 2021-01-10 ENCOUNTER — Other Ambulatory Visit: Payer: Self-pay | Admitting: Internal Medicine

## 2021-01-10 ENCOUNTER — Encounter: Payer: Self-pay | Admitting: Internal Medicine

## 2021-01-10 MED ORDER — TRULICITY 1.5 MG/0.5ML ~~LOC~~ SOAJ: 1.5000 mg | SUBCUTANEOUS | 3 refills | Status: DC

## 2021-01-25 ENCOUNTER — Telehealth: Payer: Self-pay

## 2021-01-25 NOTE — Telephone Encounter (Signed)
Fax received states that the Celexa 40 and Omeprazole 40 interact.  Reference number: [id number]  Phone number: [phone]

## 2021-03-08 ENCOUNTER — Other Ambulatory Visit: Payer: Self-pay

## 2021-03-08 ENCOUNTER — Encounter: Payer: Self-pay | Admitting: Family Medicine

## 2021-03-08 ENCOUNTER — Ambulatory Visit (INDEPENDENT_AMBULATORY_CARE_PROVIDER_SITE_OTHER): Payer: Medicare Other | Admitting: Family Medicine

## 2021-03-08 VITALS — BP 118/78 | HR 73 | Temp 97.8°F | Resp 18 | Ht 62.0 in | Wt 186.6 lb

## 2021-03-08 DIAGNOSIS — E785 Hyperlipidemia, unspecified: Secondary | ICD-10-CM

## 2021-03-08 DIAGNOSIS — R202 Paresthesia of skin: Secondary | ICD-10-CM | POA: Diagnosis not present

## 2021-03-08 DIAGNOSIS — R5383 Other fatigue: Secondary | ICD-10-CM

## 2021-03-08 DIAGNOSIS — R0789 Other chest pain: Secondary | ICD-10-CM

## 2021-03-08 DIAGNOSIS — R8290 Unspecified abnormal findings in urine: Secondary | ICD-10-CM | POA: Diagnosis not present

## 2021-03-08 DIAGNOSIS — R200 Anesthesia of skin: Secondary | ICD-10-CM | POA: Insufficient documentation

## 2021-03-08 DIAGNOSIS — E1169 Type 2 diabetes mellitus with other specified complication: Secondary | ICD-10-CM | POA: Diagnosis not present

## 2021-03-08 LAB — POC URINALSYSI DIPSTICK (AUTOMATED)
Blood, UA: NEGATIVE
Glucose, UA: NEGATIVE
Nitrite, UA: NEGATIVE
Protein, UA: NEGATIVE
Spec Grav, UA: 1.025 (ref 1.010–1.025)
Urobilinogen, UA: 0.2 E.U./dL
pH, UA: 5 (ref 5.0–8.0)

## 2021-03-08 NOTE — Progress Notes (Addendum)
Subjective:   By signing my name below, I, Zite Okoli, attest that this documentation has been prepared under the direction and in the presence of Ann Held, DO. 03/08/2021    Patient ID: Bethany Miller, female    DOB: 1946/03/11, 75 y.o.   MRN: [id number]  Chief Complaint  Patient presents with   Fatigue    Pt states sxs going on for 3-4 weeks, Pt states weakness, sweating, and dizziness.     HPI Patient is in today for an office visit.  She reports extreme fatigue, dizziness after bending down and sudden sweats at work. She also reports tingling and numbness in her arms that spread up to her neck.  She recently started 1.5 mg Trulicity and is doing well on it. She thinks she has lost some weight because she lost her appetite.  She reports having some sinus issues and tested negative for Covid-19 after taking home tests.    EKG was done at this visit and remains unchanged.  Past Medical History:  Diagnosis Date   Diabetes mellitus    Hyperlipidemia    Hypertension    Murmur     Past Surgical History:  Procedure Laterality Date   ABDOMINAL HYSTERECTOMY     BACK SURGERY     CHOLECYSTECTOMY     TONSILLECTOMY     TOTAL KNEE ARTHROPLASTY     TOTAL KNEE ARTHROPLASTY Right 02/26/2017   Procedure: RIGHT TOTAL KNEE ARTHROPLASTY;  Surgeon: Susa Day, MD;  Location: [facility];  Service: Orthopedics;  Laterality: Right;    Family History  Problem Relation Age of Onset   Throat cancer Brother    Heart disease Brother        MI   Cancer Brother 41       throat,    Hyperlipidemia Brother    Cirrhosis Father    Alcohol abuse Father    Heart disease Brother    Cancer Brother    Arthritis Mother    Hypertension Sister    Arthritis Sister    Alzheimer's disease Sister    Cancer Brother    Cancer Brother 29       melanoma   Hypertension Brother    Hyperlipidemia Brother    Melanoma Other    Arthritis Other     Social History   Socioeconomic History    Marital status: Widowed    Spouse name: Not on file   Number of children: Not on file   Years of education: Not on file   Highest education level: Not on file  Occupational History   Not on file  Tobacco Use   Smoking status: Never   Smokeless tobacco: Never  Substance and Sexual Activity   Alcohol use: Yes    Comment: rare   Drug use: No   Sexual activity: Not Currently    Partners: Male  Other Topics Concern   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     Ascorbic Acid (VITAMIN C) 100 MG tablet Take 100 mg by mouth daily.     aspirin EC 81 MG tablet Take 81 mg by mouth daily.     atorvastatin (LIPITOR) 40 MG tablet Take 1 tablet (40 mg total) by mouth daily. 90 tablet 1   cholecalciferol (VITAMIN D3) 25 MCG (1000 UNIT) tablet Take 2,000 Units by mouth daily.     citalopram (CELEXA) 40 MG tablet TAKE 1 TABLET EVERY DAY 90 tablet 3   docusate sodium (COLACE) 100 MG capsule Take 1 capsule (100 mg total) by mouth 2 (two) times daily as needed for mild constipation. 30 capsule 1   Dulaglutide (TRULICITY) 1.5 CN/4.7SJ SOPN Inject 1.5 mg into the skin once a week. 6 mL 3   fenofibrate 160 MG tablet Take 1 tablet (160 mg total) by mouth daily. 90 tablet 1   fluticasone (FLONASE) 50 MCG/ACT nasal spray Place 2 sprays into both nostrils daily. 16 g 6   glipiZIDE (GLUCOTROL) 5 MG tablet Take 1 tablet (5 mg total) by mouth daily before supper. 90 tablet 3   glucose blood  (ACCU-CHEK GUIDE) test strip USE 1 STRIP TO CHECK GLUCOSE ONCE DAILY 100 each 4   ibuprofen (ADVIL,MOTRIN) 800 MG tablet Take 800 mg by mouth 3 (three) times daily as needed for moderate pain.     Insulin Admin Supplies MISC Use to inject insulin 2 times a day. 180 each 0   insulin glargine (LANTUS SOLOSTAR) 100 UNIT/ML Solostar Pen Inject 12 Units into the skin daily. 15 mL 11   Insulin Pen Needle 32G X 4 MM MISC Use 1x a day 100 each 3   Lancets (ACCU-CHEK MULTICLIX) lancets Use as instructed to test once a day DX E11.51 100 each 1   lisinopril-hydrochlorothiazide (ZESTORETIC) 10-12.5 MG tablet Take 1 tablet by mouth daily. 90 tablet 1   Loratadine-Pseudoephedrine (CLARITIN-D 12 HOUR PO) Take 1 tablet by mouth daily as needed (sinus).      meloxicam (MOBIC) 7.5 MG tablet Take 1 tablet (7.5 mg total) by mouth daily. 90 tablet 1   metFORMIN (GLUCOPHAGE-XR) 500 MG 24 hr tablet Take 1 tablet (500 mg total) by mouth 2 (two) times daily. 180 tablet 3   Multiple Vitamins-Minerals (MULTIVITAMIN ADULT PO) Take 1 tablet by mouth daily.      omeprazole (PRILOSEC) 20 MG capsule Take 1 capsule (20 mg total) by mouth daily. 90 capsule 3   traZODone (DESYREL) 50 MG tablet TAKE 1/2 TO 1 (ONE-HALF TO ONE) TABLET BY MOUTH AT BEDTIME AS NEEDED FOR  SLEEP 90 tablet 1   vitamin B-12 (CYANOCOBALAMIN) 100 MCG tablet Take 100 mcg by mouth daily.     zinc gluconate 50 MG tablet Take 50 mg by mouth daily.     No facility-administered medications prior to visit.    Allergies  Allergen Reactions   Ozempic (0.25 Or 0.5 Mg-Dose) [Semaglutide(0.25 Or 0.5mg -Dos)] Diarrhea and Nausea And Vomiting    Pt stated, "I got dehydrated from this as well"    Review of Systems  Constitutional:  Positive for malaise/fatigue. Negative for chills, diaphoresis and fever.       (+) sweats  Cardiovascular:  Negative for chest pain and palpitations.  Neurological:  Positive for dizziness and tingling.       (+) numbness       Objective:    Physical Exam Vitals and nursing note reviewed.  Constitutional:  General: She is not in acute distress.    Appearance: Normal appearance. She is not ill-appearing.  HENT:     Head: Normocephalic and atraumatic.     Right Ear: External ear normal.     Left Ear: External ear normal.  Eyes:     Extraocular Movements: Extraocular movements intact.     Pupils: Pupils are equal, round, and reactive to light.  Cardiovascular:     Rate and Rhythm: Normal rate and regular rhythm.     Pulses: Normal pulses.     Heart sounds: Normal heart sounds. No murmur heard.   No gallop.  Pulmonary:     Effort: Pulmonary effort is normal. No respiratory distress.     Breath sounds: Normal breath sounds. No wheezing, rhonchi or rales.  Abdominal:     General: Bowel sounds are normal. There is no distension.     Palpations: Abdomen is soft. There is no mass.     Tenderness: There is no abdominal tenderness. There is no guarding or rebound.     Hernia: No hernia is present.  Musculoskeletal:     Cervical back: Normal range of motion and neck supple.  Lymphadenopathy:     Cervical: No cervical adenopathy.  Skin:    General: Skin is warm and dry.  Neurological:     Mental Status: She is alert and oriented to person, place, and time.  Psychiatric:        Behavior: Behavior normal.    BP 118/78 (BP Location: Left Arm, Patient Position: Sitting, Cuff Size: Large)   Pulse 73   Temp 97.8 F (36.6 C) (Oral)   Resp 18   Ht 5\' 2"  (1.575 m)   Wt 186 lb 9.6 oz (84.6 kg)   SpO2 94%   BMI 34.13 kg/m  Wt Readings from Last 3 Encounters:  03/08/21 186 lb 9.6 oz (84.6 kg)  12/07/20 193 lb 8 oz (87.8 kg)  11/13/20 193 lb (87.5 kg)    Diabetic Foot Exam - Simple   No data filed    Lab Results  Component Value Date   WBC 6.8 10/15/2020   HGB 10.1 (A) 10/15/2020   HCT 31 (A) 10/15/2020   PLT 340 10/15/2020   GLUCOSE 120 (H) 09/17/2020   CHOL 129 09/17/2020   TRIG 149.0  09/17/2020   HDL 43.70 09/17/2020   LDLDIRECT 149.6 01/26/2013   LDLCALC 55 09/17/2020   ALT 17 10/15/2020   AST 20 10/15/2020   NA 137 10/15/2020   K 50.0 (A) 10/15/2020   CL 103 10/15/2020   CREATININE 1.5 (A) 10/15/2020   BUN 28 (A) 10/15/2020   CO2 23 (A) 10/15/2020   TSH 2.11 01/31/2020   INR 0.96 02/17/2017   HGBA1C 7.0 (A) 12/07/2020   MICROALBUR 0.6 03/28/2016    Lab Results  Component Value Date   TSH 2.11 01/31/2020   Lab Results  Component Value Date   WBC 6.8 10/15/2020   HGB 10.1 (A) 10/15/2020   HCT 31 (A) 10/15/2020   MCV 88.3 01/31/2020   PLT 340 10/15/2020   Lab Results  Component Value Date   NA 137 10/15/2020   K 50.0 (A) 10/15/2020   CO2 23 (A) 10/15/2020   GLUCOSE 120 (H) 09/17/2020   BUN 28 (A) 10/15/2020   CREATININE 1.5 (A) 10/15/2020   BILITOT 0.4 09/17/2020   ALKPHOS 51 10/15/2020   AST 20 10/15/2020   ALT 17 10/15/2020   PROT 6.5 09/17/2020   ALBUMIN 4.0 10/15/2020  CALCIUM 9.4 10/15/2020   ANIONGAP 13 11/25/2018   GFR 29.87 (L) 09/17/2020   Lab Results  Component Value Date   CHOL 129 09/17/2020   Lab Results  Component Value Date   HDL 43.70 09/17/2020   Lab Results  Component Value Date   LDLCALC 55 09/17/2020   Lab Results  Component Value Date   TRIG 149.0 09/17/2020   Lab Results  Component Value Date   CHOLHDL 3 09/17/2020   Lab Results  Component Value Date   HGBA1C 7.0 (A) 12/07/2020       Assessment & Plan:   Problem List Items Addressed This Visit       Unprioritized   Hyperlipidemia associated with type 2 diabetes mellitus ([place])   Relevant Orders   Comprehensive metabolic panel   Lipid panel   Atypical chest pain    ekg--- no change from previous Consider f/u cardiology if labs are normal  She saw them years ago and just stopped going      Relevant Orders   EKG 12-Lead (Completed)   Numbness and tingling    L arm and neck Occurs with exertion and heat       Relevant Orders   POCT  Urinalysis Dipstick (Automated) (Completed)   CBC with Differential/Platelet   Comprehensive metabolic panel   Lipid panel   Vitamin B12   Other fatigue - Primary    Check labs  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   I,Zite Okoli,acting as a Education administrator for [employer], DO.,have documented all relevant documentation on the behalf of Ann Held, DO,as directed by  Ann Held, DO while in the presence of [place], DO, personally preformed the services described in this documentation.  All medical record entries made by the scribe were at my direction and in my presence.  I have reviewed the chart and discharge instructions (if applicable) and agree that the record reflects my personal performance and is accurate and complete. 03/08/2021

## 2021-03-08 NOTE — Addendum Note (Signed)
Addended by: Sanda Linger on: 03/08/2021 04:25 PM   Modules accepted: Orders

## 2021-03-08 NOTE — Assessment & Plan Note (Signed)
ekg--- no change from previous Consider f/u cardiology if labs are normal  She saw them years ago and just stopped going

## 2021-03-08 NOTE — Assessment & Plan Note (Signed)
L arm and neck Occurs with exertion and heat

## 2021-03-08 NOTE — Assessment & Plan Note (Signed)
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

## 2021-03-08 NOTE — Patient Instructions (Signed)

## 2021-03-09 LAB — COMPREHENSIVE METABOLIC PANEL
AG Ratio: 1.5 (calc) (ref 1.0–2.5)
ALT: 11 U/L (ref 6–29)
AST: 11 U/L (ref 10–35)
Albumin: 3.9 g/dL (ref 3.6–5.1)
Alkaline phosphatase (APISO): 64 U/L (ref 37–153)
BUN/Creatinine Ratio: 18 (calc) (ref 6–22)
BUN: 26 mg/dL — ABNORMAL HIGH (ref 7–25)
CO2: 25 mmol/L (ref 20–32)
Calcium: 9.3 mg/dL (ref 8.6–10.4)
Chloride: 102 mmol/L (ref 98–110)
Creat: 1.42 mg/dL — ABNORMAL HIGH (ref 0.60–1.00)
Globulin: 2.6 g/dL (calc) (ref 1.9–3.7)
Glucose, Bld: 186 mg/dL — ABNORMAL HIGH (ref 65–99)
Potassium: 4.6 mmol/L (ref 3.5–5.3)
Sodium: 137 mmol/L (ref 135–146)
Total Bilirubin: 0.4 mg/dL (ref 0.2–1.2)
Total Protein: 6.5 g/dL (ref 6.1–8.1)

## 2021-03-09 LAB — CBC WITH DIFFERENTIAL/PLATELET
Absolute Monocytes: 530 cells/uL (ref 200–950)
Basophils Absolute: 23 cells/uL (ref 0–200)
Basophils Relative: 0.3 %
Eosinophils Absolute: 86 cells/uL (ref 15–500)
Eosinophils Relative: 1.1 %
HCT: 35.8 % (ref 35.0–45.0)
Hemoglobin: 12 g/dL (ref 11.7–15.5)
Lymphs Abs: 1591 cells/uL (ref 850–3900)
MCH: 29.4 pg (ref 27.0–33.0)
MCHC: 33.5 g/dL (ref 32.0–36.0)
MCV: 87.7 fL (ref 80.0–100.0)
MPV: 9.6 fL (ref 7.5–12.5)
Monocytes Relative: 6.8 %
Neutro Abs: 5569 cells/uL (ref 1500–7800)
Neutrophils Relative %: 71.4 %
Platelets: 308 10*3/uL (ref 140–400)
RBC: 4.08 10*6/uL (ref 3.80–5.10)
RDW: 13.2 % (ref 11.0–15.0)
Total Lymphocyte: 20.4 %
WBC: 7.8 10*3/uL (ref 3.8–10.8)

## 2021-03-09 LAB — TSH: TSH: 1.65 mIU/L (ref 0.40–4.50)

## 2021-03-09 LAB — URINE CULTURE
MICRO NUMBER:: 12296927
SPECIMEN QUALITY:: ADEQUATE

## 2021-03-09 LAB — LIPID PANEL
Cholesterol: 147 mg/dL (ref <200)
HDL: 36 mg/dL — ABNORMAL LOW (ref >=50)
LDL Cholesterol (Calc): 87 mg/dL (calc)
Non-HDL Cholesterol (Calc): 111 mg/dL (calc) (ref <130)
Total CHOL/HDL Ratio: 4.1 (calc) (ref <5.0)
Triglycerides: 137 mg/dL (ref <150)

## 2021-03-09 LAB — VITAMIN B12: Vitamin B-12: 730 pg/mL (ref 200–1100)

## 2021-04-14 DIAGNOSIS — Z23 Encounter for immunization: Secondary | ICD-10-CM | POA: Diagnosis not present

## 2021-04-14 DIAGNOSIS — U071 COVID-19: Secondary | ICD-10-CM | POA: Diagnosis not present

## 2021-04-16 ENCOUNTER — Encounter: Payer: Self-pay | Admitting: Internal Medicine

## 2021-04-16 ENCOUNTER — Other Ambulatory Visit: Payer: Self-pay

## 2021-04-16 ENCOUNTER — Ambulatory Visit (INDEPENDENT_AMBULATORY_CARE_PROVIDER_SITE_OTHER): Payer: Medicare Other | Admitting: Internal Medicine

## 2021-04-16 VITALS — BP 130/92 | HR 66 | Ht 62.0 in | Wt 185.0 lb

## 2021-04-16 DIAGNOSIS — E1122 Type 2 diabetes mellitus with diabetic chronic kidney disease: Secondary | ICD-10-CM

## 2021-04-16 DIAGNOSIS — E785 Hyperlipidemia, unspecified: Secondary | ICD-10-CM | POA: Diagnosis not present

## 2021-04-16 DIAGNOSIS — N1832 Chronic kidney disease, stage 3b: Secondary | ICD-10-CM

## 2021-04-16 DIAGNOSIS — E669 Obesity, unspecified: Secondary | ICD-10-CM | POA: Diagnosis not present

## 2021-04-16 LAB — POCT GLYCOSYLATED HEMOGLOBIN (HGB A1C): Hemoglobin A1C: 7.3 % — AB (ref 4.0–5.6)

## 2021-04-16 MED ORDER — LANTUS SOLOSTAR 100 UNIT/ML ~~LOC~~ SOPN: 12.0000 [IU] | PEN_INJECTOR | Freq: Every day | SUBCUTANEOUS | 11 refills | Status: DC

## 2021-04-16 MED ORDER — INSULIN PEN NEEDLE 32G X 4 MM MISC: 3 refills | Status: DC

## 2021-04-16 NOTE — Progress Notes (Signed)
Patient ID: Bethany Miller, female   DOB: 1946/03/27, 75 y.o.   MRN: [id number]  This visit occurred during the SARS-CoV-2 public health emergency.  Safety protocols were in place, including screening questions prior to the visit, additional usage of staff PPE, and extensive cleaning of exam room while observing appropriate contact time as indicated for disinfecting solutions.   HPI: Bethany Miller is a 75 y.o.-year-old female, presenting for f/u for DM2, dx 2010, non-insulin-dependent, uncontrolled, with complications (CKD).  Last visit 4 months ago. She has [employer] part D for meds, [employer], and [employer] for the rest.   Interim history: No increased urination, blurry vision, nausea, chest pain. She stopped Metformin b/c loose stools for 3-4 weeks and AP >> now restarted last night. She is under a lot of stress >> son getting married, moved out of the house.  Reviewed HbA1c levels: Lab Results  Component Value Date   HGBA1C 7.0 (A) 12/07/2020   HGBA1C 8.1 (A) 08/31/2020   HGBA1C 7.4 (A) 02/28/2020    Previously on: - Metformin ER 1000 mg 2x a day (2000 mg with dinner did not change the sugars) >> 1000 mg with breakfast - Glipizide 5 >> 10 mg 2x a day before breakfast and dinner - >> stopped 02/2020 by Dr. Kelton Pillar We tried Ozempic but she developed nausea and vomiting and ended up in the emergency room in 11/2018. We again tried [place]  - added again 04/2018 >> not affordable Stopped  Glipizide 5 mg 2x a day 06/2017 b/c lows, but restarted afterwards. Tradjenta and Alogliptin were not covered She was previously on Invokana, Jardiance 10 mg daily >> too expensive. Tried Cycloset >> too expensive Tried Januvia >>  too expensive.  Tried Metformin 500 mg po >> severe diarrhea. She was on Glimepiride >> hypoglycemia in the 60's on 2 mg daily. She had rapid acting insulin with steroid shots before.   At last visit, I advised her to take: - Metformin ER 500 mg 2x a day >> 1000  mg with dinner >> restarted - Glipizide 5 mg before a larger dinner - Trulicity 3.81 mg weekly >> started ~09/2020 >> 1.5 mg weekly Stopped Lantus ~09/2020.  Pt checks her sugars twice a day: - am: 141, 200s >> 140-202 >> 112-170 >> 130-269 - 2h after b'fast: 97, 164, 173 >> n/c - Lunch:  low 100s >> n/c >> 163 >> n/c - 2h after lunch: 160 >> n/c - Dinnertime:  72 (06/2020) >> n/c >> 122, 129 - 2h after dinner: 128-250 >> 99, 200 >> 97-130 >> n/c Lowest sugar was 56 x1 >> ...72 >> 112 >> 122;  she has hypoglycemia awareness in the 90s. Highest sugar was 300 >> 200s >> 240 (steak and cheese) >> 269.  ReliOn meter.  -+ CKD, last BUN/creatinine:  Lab Results  Component Value Date   BUN 26 (H) 03/08/2021   CREATININE 1.42 (H) 03/08/2021   No MAU: Lab Results  Component Value Date   MICRALBCREAT 4 03/28/2016   MICRALBCREAT 0.6 11/16/2015   MICRALBCREAT 0.6 01/26/2013   MICRALBCREAT 0.3 03/24/2012   MICRALBCREAT 0.4 03/18/2011   MICRALBCREAT 11.4 10/29/2009   MICRALBCREAT 3.4 04/09/2009   MICRALBCREAT 17.1 01/25/2009   GFR: Lab Results  Component Value Date   GFRNONAA 29 (L) 11/25/2018   GFRNONAA 45 (L) 04/21/2017   GFRNONAA 54 (L) 02/27/2017   GFRNONAA 35 (L) 02/17/2017   GFRNONAA 52.79 (L) 06/19/2010   GFRNONAA 59.68 10/29/2009   GFRNONAA 53.52 07/03/2009  GFRNONAA 59.78 04/09/2009   GFRNONAA 59.82 01/25/2009   GFRNONAA 78 05/08/2008  On Lisinopril 10.  -+ HL; last set of lipids: Lab Results  Component Value Date   CHOL 147 03/08/2021   HDL 36 (L) 03/08/2021   LDLCALC 87 03/08/2021   LDLDIRECT 149.6 01/26/2013   TRIG 137 03/08/2021   CHOLHDL 4.1 03/08/2021  On Lipitor 40, Fenofibrate 160.  - last eye exam was in 04/2019: No DR  -denies numbness and tingling in her feet.  She sees podiatry for ingrown toenails.  She also has a history of HTN. 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  ROS: + See HPI  I reviewed pt's medications, allergies, PMH, social hx, family hx, and  changes were documented in the history of present illness. Otherwise, unchanged from my initial visit note.  Past Medical History:  Diagnosis Date   Diabetes mellitus    Hyperlipidemia    Hypertension    Murmur    Past Surgical History:  Procedure Laterality Date   ABDOMINAL HYSTERECTOMY     BACK SURGERY     CHOLECYSTECTOMY     TONSILLECTOMY     TOTAL KNEE ARTHROPLASTY     TOTAL KNEE ARTHROPLASTY Right 02/26/2017   Procedure: RIGHT TOTAL KNEE ARTHROPLASTY;  Surgeon: Susa Day, MD;  Location: [facility];  Service: Orthopedics;  Laterality: Right;   Social History   Socioeconomic History   Marital status: Widowed    Spouse name: Not on file   Number of children: Not on file   Years of education: Not on file   Highest education level: Not on file  Occupational History   Not on file  Tobacco Use   Smoking status: Never   Smokeless tobacco: Never  Substance and Sexual Activity   Alcohol use: Yes    Comment: rare   Drug use: No   Sexual activity: Not Currently    Partners: Male  Other Topics Concern   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     Ascorbic Acid (VITAMIN C) 100 MG tablet Take 100 mg by mouth daily.     aspirin EC 81 MG tablet Take 81 mg by mouth daily.     atorvastatin (LIPITOR) 40 MG tablet Take 1 tablet (40 mg total) by mouth daily. 90 tablet 1   cholecalciferol (VITAMIN D3) 25 MCG (1000 UNIT) tablet Take 2,000 Units by  mouth daily.     citalopram (CELEXA) 40 MG tablet TAKE 1 TABLET EVERY DAY 90 tablet 3   docusate sodium (COLACE) 100 MG capsule Take 1 capsule (100 mg total) by mouth 2 (two) times daily as needed for mild constipation. 30 capsule 1   Dulaglutide (TRULICITY) 1.5 MW/1.0UV SOPN Inject 1.5 mg into the skin once a week. 6 mL 3   fenofibrate 160 MG tablet Take 1 tablet (160 mg total) by mouth daily. 90 tablet 1   fluticasone (FLONASE) 50  MCG/ACT nasal spray Place 2 sprays into both nostrils daily. 16 g 6   glipiZIDE (GLUCOTROL) 5 MG tablet Take 1 tablet (5 mg total) by mouth daily before supper. 90 tablet 3   glucose blood (ACCU-CHEK GUIDE) test strip USE 1 STRIP TO CHECK GLUCOSE ONCE DAILY 100 each 4   ibuprofen (ADVIL,MOTRIN) 800 MG tablet Take 800 mg by mouth 3 (three) times daily as needed for moderate pain.     Insulin Admin Supplies MISC Use to inject insulin 2 times a day. 180 each 0   insulin glargine (LANTUS SOLOSTAR) 100 UNIT/ML Solostar Pen Inject 12 Units into the skin daily. 15 mL 11   Insulin Pen Needle 32G X 4 MM MISC Use 1x a day 100 each 3   Lancets (ACCU-CHEK MULTICLIX) lancets Use as instructed to test once a day DX E11.51 100 each 1   lisinopril-hydrochlorothiazide (ZESTORETIC) 10-12.5 MG tablet Take 1 tablet by mouth daily. 90 tablet 1   Loratadine-Pseudoephedrine (CLARITIN-D 12 HOUR PO) Take 1 tablet by mouth daily as needed (sinus).      meloxicam (MOBIC) 7.5 MG tablet Take 1 tablet (7.5 mg total) by mouth daily. 90 tablet 1   metFORMIN (GLUCOPHAGE-XR) 500 MG 24 hr tablet Take 1 tablet (500 mg total) by mouth 2 (two) times daily. 180 tablet 3   Multiple Vitamins-Minerals (MULTIVITAMIN ADULT PO) Take 1 tablet by mouth daily.      omeprazole (PRILOSEC) 20 MG capsule Take 1 capsule (20 mg total) by mouth daily. 90 capsule 3   traZODone (DESYREL) 50 MG tablet TAKE 1/2 TO 1 (ONE-HALF TO ONE) TABLET BY MOUTH AT BEDTIME AS NEEDED FOR  SLEEP 90 tablet 1   vitamin B-12 (CYANOCOBALAMIN)  100 MCG tablet Take 100 mcg by mouth daily.     zinc gluconate 50 MG tablet Take 50 mg by mouth daily.     No current facility-administered medications on file prior to visit.   Allergies  Allergen Reactions   Ozempic (0.25 Or 0.5 Mg-Dose) [Semaglutide(0.25 Or 0.5mg -Dos)] Diarrhea and Nausea And Vomiting    Pt stated, "I got dehydrated from this as well"   Family History  Problem Relation Age of Onset   Throat cancer Brother    Heart disease Brother        MI   Cancer Brother 80       throat,    Hyperlipidemia Brother    Cirrhosis Father    Alcohol abuse Father    Heart disease Brother    Cancer Brother    Arthritis Mother    Hypertension Sister    Arthritis Sister    Alzheimer's disease Sister    Cancer Brother    Cancer Brother 77       melanoma   Hypertension Brother    Hyperlipidemia Brother    Melanoma Other    Arthritis Other    PE: BP (!) 130/92 (BP Location: Right Arm, Patient Position: Sitting, Cuff Size: Normal)   Pulse 66   Ht 5\' 2"  (1.575 m)   Wt 185 lb (83.9 kg)   SpO2 94%   BMI 33.84 kg/m  Wt Readings from Last 3 Encounters:  04/16/21 185 lb (83.9 kg)  03/08/21 186 lb 9.6 oz (84.6 kg)  12/07/20 193 lb 8 oz (87.8 kg)   Constitutional: overweight, in NAD Eyes: PERRLA, EOMI, no exophthalmos ENT: moist mucous membranes, no thyromegaly, no cervical lymphadenopathy Cardiovascular: RRR, No MRG Respiratory: CTA B Gastrointestinal: abdomen soft, NT, ND, BS+ Musculoskeletal: no deformities,  strength intact in all 4 Skin: moist, warm, no rashes Neurological: no tremor with outstretched hands, DTR normal in all 4  ASSESSMENT: 1. DM2, non-insulin-dependent, uncontrolled, with long-term complications: - CKD  2. HL   3. obesity class II  PLAN:  1. Patient with history of uncontrolled diabetes, on oral medication with metformin and prn sulfonylurea, and also weekly GLP-1 receptor agonist.  She could not tolerate Ozempic due to GI symptoms but she now  does well on Trulicity.  Of note, she stopped the insulin when starting Trulicity as she did not want to gain weight.  At last visit, she was only checking sugars in the morning and they were up to 170, above target, prior to our last visit.  She was not checking sugars later in the day and I strongly advised her to start.  She did not want to add back insulin.  In that case, I advised her to move the entire dose of metformin ER with dinner to hopefully her morning sugars and I also advised her to take glipizide before a large dinner.  I advised her to get in touch with me and that sugars did not improve afterwards so that we could increase the dose of Trulicity.  Of note, she is getting this through the patient assistance program. -Of note, she did see Antonieta Iba with nutrition earlier in the year -Latest HbA1c obtained at last visit was 7.0%, improved -At today's visit, however, sugars in the morning are higher, with quite a few spikes in the 200s.  She rarely has blood sugars in the 120s and 130s.  She had 2 blood sugar checks before dinner and these were at goal. -At today's visit, we discussed about splitting the metformin dose again into 2 doses, since she does have runny stools from taking the whole dose with dinner.  I did not recommend stopping it again unless she cannot tolerate it.  However, since sugars are so high, I recommended to add back insulin.  We will start at a low dose and I advised her how to titrate it up.  She has vials of Lantus at home and I sent a prescription for Gerkin's, in case these are affordable. - I suggested to:  Patient Instructions  Please change: - Metformin ER 500 mg 2x a day  Please continue: - Glipizide 5 mg 15 min before a large meal - Trulicity 1.5 mg weekly  Please start: - Lantus 10 units at bedtime and increase by 2 units every 2 days until sugars in am are consistently <140. Let me know if you reach to 24 units.  Please return in 3 months with your  sugar log.   - we checked her HbA1c: 7.3% (higher) - advised to check sugars at different times of the day - 1x a day, rotating check times - advised for yearly eye exams >> she is not UTD - return to clinic in 3 months    2. HL -Reviewed latest lipid panel from 02/2021: LDL above our goal of less than 70, HDL low: Lab Results  Component Value Date   CHOL 147 03/08/2021   HDL 36 (L) 03/08/2021   LDLCALC 87 03/08/2021   LDLDIRECT 149.6 01/26/2013   TRIG 137 03/08/2021   CHOLHDL 4.1 03/08/2021  -She continues on Lipitor 40 mg daily and fenofibrate 160 mg daily without side effects  3.  Obesity -She could not tolerate Ozempic due to nausea and vomiting. -We started Trulicity and she tolerates this well.  This should also help with weight loss -lost 2 lbs before last visit and 8 lbs since then  Philemon Kingdom, MD PhD [facility]

## 2021-04-16 NOTE — Patient Instructions (Addendum)
Please change: - Metformin ER 500 mg 2x a day  Please continue: - Glipizide 5 mg 15 min before a large meal - Trulicity 1.5 mg weekly  Please start: - Lantus 10 units at bedtime and increase by 2 units every 2 days until sugars in am are consistently <140. Let me know if you reach to 24 units.  Please return in 3 months with your sugar log.

## 2021-04-18 ENCOUNTER — Other Ambulatory Visit: Payer: Self-pay

## 2021-04-18 ENCOUNTER — Ambulatory Visit (INDEPENDENT_AMBULATORY_CARE_PROVIDER_SITE_OTHER): Payer: Medicare Other | Admitting: Podiatry

## 2021-04-18 ENCOUNTER — Ambulatory Visit (INDEPENDENT_AMBULATORY_CARE_PROVIDER_SITE_OTHER): Payer: Medicare Other

## 2021-04-18 ENCOUNTER — Encounter: Payer: Self-pay | Admitting: Podiatry

## 2021-04-18 DIAGNOSIS — M7661 Achilles tendinitis, right leg: Secondary | ICD-10-CM | POA: Diagnosis not present

## 2021-04-18 DIAGNOSIS — M79671 Pain in right foot: Secondary | ICD-10-CM

## 2021-04-18 DIAGNOSIS — M7731 Calcaneal spur, right foot: Secondary | ICD-10-CM | POA: Diagnosis not present

## 2021-04-18 NOTE — Progress Notes (Signed)
  Subjective:  Patient ID: Bethany Miller, female    DOB: [birth year],   MRN: [id number]  Chief Complaint  Patient presents with   Foot Problem    HEEL/ACHILLES PAIN right foot     75 y.o. female presents for right achilles heel pain that has been going on for about a month. Denies any injury. Has some minor swelling and pain when walking. Has tried icing and using voltaren gel. Voltaren has not helped much. Currently not taking any anti-inflammatory due to kidney disease . Denies any other pedal complaints. Denies n/v/f/c.   Past Medical History:  Diagnosis Date   Diabetes mellitus    Hyperlipidemia    Hypertension    Murmur     Objective:  Physical Exam: Vascular: DP/PT pulses 2/4 bilateral. CFT <3 seconds. Normal hair growth on digits. No edema.  Skin. No lacerations or abrasions bilateral feet.  Musculoskeletal: MMT 5/5 bilateral lower extremities in DF, PF, Inversion and Eversion. Deceased ROM in DF of ankle joint. Tender at achilles insertion of posterior heel. Pain with PF and DF of the ankle.  Neurological: Sensation intact to light touch.   Assessment:   1. Tendonitis, Achilles, right      Plan:  Patient was evaluated and treated and all questions answered. -Xrays reviewed. X-rays reviewed and discussed with patient. No acute fractures or dislocations noted. Minor spurring noted to posterior and inferior calcaneus.  -Discussed Achilles insertional tendonitis and treatment options with patient.  -Discussed stretching exercises. -Patient has meloxicam at home with discuss with nephrologist on risks and benefits of taking.  -Heel lifts provided and discussed proper shoewear.  -Discussed if no improvement will consider MRI/PT/EPAT/PRP injections.  -Patient to return to office as needed or sooner if condition worsens.   Lorenda Peck, DPM

## 2021-04-18 NOTE — Patient Instructions (Signed)

## 2021-04-22 ENCOUNTER — Other Ambulatory Visit: Payer: Self-pay | Admitting: Family Medicine

## 2021-04-22 DIAGNOSIS — E785 Hyperlipidemia, unspecified: Secondary | ICD-10-CM

## 2021-04-22 DIAGNOSIS — M5441 Lumbago with sciatica, right side: Secondary | ICD-10-CM

## 2021-04-22 DIAGNOSIS — I10 Essential (primary) hypertension: Secondary | ICD-10-CM

## 2021-05-07 ENCOUNTER — Other Ambulatory Visit: Payer: Self-pay | Admitting: Family Medicine

## 2021-05-07 DIAGNOSIS — G4700 Insomnia, unspecified: Secondary | ICD-10-CM

## 2021-05-16 ENCOUNTER — Ambulatory Visit: Payer: Medicare Other | Attending: Internal Medicine

## 2021-05-16 ENCOUNTER — Other Ambulatory Visit: Payer: Self-pay

## 2021-05-16 ENCOUNTER — Encounter: Payer: Self-pay | Admitting: Family Medicine

## 2021-05-16 ENCOUNTER — Ambulatory Visit (INDEPENDENT_AMBULATORY_CARE_PROVIDER_SITE_OTHER): Payer: Medicare Other | Admitting: Family Medicine

## 2021-05-16 ENCOUNTER — Ambulatory Visit (INDEPENDENT_AMBULATORY_CARE_PROVIDER_SITE_OTHER): Payer: Medicare Other

## 2021-05-16 VITALS — BP 130/62 | HR 59 | Temp 97.9°F | Resp 16 | Ht 62.0 in | Wt 185.2 lb

## 2021-05-16 VITALS — BP 130/62 | HR 59 | Temp 97.9°F | Resp 16 | Ht 62.0 in | Wt 185.0 lb

## 2021-05-16 DIAGNOSIS — E2839 Other primary ovarian failure: Secondary | ICD-10-CM | POA: Diagnosis not present

## 2021-05-16 DIAGNOSIS — Z1231 Encounter for screening mammogram for malignant neoplasm of breast: Secondary | ICD-10-CM

## 2021-05-16 DIAGNOSIS — I10 Essential (primary) hypertension: Secondary | ICD-10-CM

## 2021-05-16 DIAGNOSIS — M25562 Pain in left knee: Secondary | ICD-10-CM

## 2021-05-16 DIAGNOSIS — E785 Hyperlipidemia, unspecified: Secondary | ICD-10-CM | POA: Diagnosis not present

## 2021-05-16 DIAGNOSIS — Z23 Encounter for immunization: Secondary | ICD-10-CM

## 2021-05-16 DIAGNOSIS — E1169 Type 2 diabetes mellitus with other specified complication: Secondary | ICD-10-CM | POA: Diagnosis not present

## 2021-05-16 DIAGNOSIS — E1165 Type 2 diabetes mellitus with hyperglycemia: Secondary | ICD-10-CM

## 2021-05-16 DIAGNOSIS — Z792 Long term (current) use of antibiotics: Secondary | ICD-10-CM

## 2021-05-16 DIAGNOSIS — G8929 Other chronic pain: Secondary | ICD-10-CM | POA: Diagnosis not present

## 2021-05-16 DIAGNOSIS — Z0000 Encounter for general adult medical examination without abnormal findings: Secondary | ICD-10-CM | POA: Diagnosis not present

## 2021-05-16 LAB — LIPID PANEL
Cholesterol: 175 mg/dL (ref 0–200)
HDL: 47.3 mg/dL (ref >39.00)
LDL Cholesterol: 107 mg/dL — ABNORMAL HIGH (ref 0–99)
NonHDL: 127.43
Total CHOL/HDL Ratio: 4
Triglycerides: 103 mg/dL (ref 0.0–149.0)
VLDL: 20.6 mg/dL (ref 0.0–40.0)

## 2021-05-16 LAB — COMPREHENSIVE METABOLIC PANEL
ALT: 9 U/L (ref 0–35)
AST: 12 U/L (ref 0–37)
Albumin: 4.2 g/dL (ref 3.5–5.2)
Alkaline Phosphatase: 60 U/L (ref 39–117)
BUN: 24 mg/dL — ABNORMAL HIGH (ref 6–23)
CO2: 26 mEq/L (ref 19–32)
Calcium: 9.6 mg/dL (ref 8.4–10.5)
Chloride: 104 mEq/L (ref 96–112)
Creatinine, Ser: 1.33 mg/dL — ABNORMAL HIGH (ref 0.40–1.20)
GFR: 39.63 mL/min — ABNORMAL LOW (ref >60.00)
Glucose, Bld: 143 mg/dL — ABNORMAL HIGH (ref 70–99)
Potassium: 5.1 mEq/L (ref 3.5–5.1)
Sodium: 136 mEq/L (ref 135–145)
Total Bilirubin: 0.4 mg/dL (ref 0.2–1.2)
Total Protein: 6.6 g/dL (ref 6.0–8.3)

## 2021-05-16 LAB — CBC WITH DIFFERENTIAL/PLATELET
Basophils Absolute: 0 10*3/uL (ref 0.0–0.1)
Basophils Relative: 0.4 % (ref 0.0–3.0)
Eosinophils Absolute: 0.1 10*3/uL (ref 0.0–0.7)
Eosinophils Relative: 1.6 % (ref 0.0–5.0)
HCT: 36.7 % (ref 36.0–46.0)
Hemoglobin: 11.9 g/dL — ABNORMAL LOW (ref 12.0–15.0)
Lymphocytes Relative: 18.9 % (ref 12.0–46.0)
Lymphs Abs: 1.1 10*3/uL (ref 0.7–4.0)
MCHC: 32.5 g/dL (ref 30.0–36.0)
MCV: 89.6 fl (ref 78.0–100.0)
Monocytes Absolute: 0.3 10*3/uL (ref 0.1–1.0)
Monocytes Relative: 5.1 % (ref 3.0–12.0)
Neutro Abs: 4.3 10*3/uL (ref 1.4–7.7)
Neutrophils Relative %: 74 % (ref 43.0–77.0)
Platelets: 277 10*3/uL (ref 150.0–400.0)
RBC: 4.09 Mil/uL (ref 3.87–5.11)
RDW: 14.4 % (ref 11.5–15.5)
WBC: 5.8 10*3/uL (ref 4.0–10.5)

## 2021-05-16 MED ORDER — AMOXICILLIN 500 MG PO CAPS: ORAL_CAPSULE | ORAL | 2 refills | Status: DC

## 2021-05-16 NOTE — Assessment & Plan Note (Signed)
Well controlled, no changes to meds. Encouraged heart healthy diet such as the DASH diet and exercise as tolerated.  °

## 2021-05-16 NOTE — Assessment & Plan Note (Signed)
Tolerating statin, encouraged heart healthy diet, avoid trans fats, minimize simple carbs and saturated fats. Increase exercise as tolerated 

## 2021-05-16 NOTE — Patient Instructions (Addendum)

## 2021-05-16 NOTE — Progress Notes (Signed)
   Covid-19 Vaccination Clinic  Name:  Bethany Miller    MRN: [id number] DOB: 03-Jun-1946  05/16/2021  Bethany Miller was observed post Covid-19 immunization for 15 minutes without incident. She was provided with Vaccine Information Sheet and instruction to access the V-Safe system.   Bethany Miller was instructed to call 911 with any severe reactions post vaccine: Difficulty breathing  Swelling of face and throat  A fast heartbeat  A bad rash all over body  Dizziness and weakness   Immunizations Administered     Name Date Dose VIS Date Route   Pfizer Covid-19 Vaccine Bivalent Booster 05/16/2021 11:32 AM 0.3 mL 03/13/2021 Intramuscular   Manufacturer: [place]   Lot: [id number]   [place]: [phone]

## 2021-05-16 NOTE — Patient Instructions (Signed)
[name] , Thank you for taking time to come for your Medicare Wellness Visit. I appreciate your ongoing commitment to your health goals. Please review the following plan we discussed and let me know if I can assist you in the future.   Screening recommendations/referrals: Colonoscopy: Cologuard completed 08/05/2020-Due 08/06/2023 Mammogram: Ordered today. Bone Density: Declined Recommended yearly ophthalmology/optometry visit for glaucoma screening and checkup Recommended yearly dental visit for hygiene and checkup  Vaccinations: Influenza vaccine: Up to date Pneumococcal vaccine: Up to date Tdap vaccine:Discuss with pharmacy Shingles vaccine: Discuss with pharmacy   Covid-19:Booster available at the pharmacy  Advanced directives: Information given today  Conditions/risks identified: See problem list  Next appointment: Follow up in one year for your annual wellness visit    Preventive Care 65 Years and Older, Female Preventive care refers to lifestyle choices and visits with your health care provider that can promote health and wellness. What does preventive care include? A yearly physical exam. This is also called an annual well check. Dental exams once or twice a year. Routine eye exams. Ask your health care provider how often you should have your eyes checked. Personal lifestyle choices, including: Daily care of your teeth and gums. Regular physical activity. Eating a healthy diet. Avoiding tobacco and drug use. Limiting alcohol use. Practicing safe sex. Taking low-dose aspirin every day. Taking vitamin and mineral supplements as recommended by your health care provider. What happens during an annual well check? The services and screenings done by your health care provider during your annual well check will depend on your age, overall health, lifestyle risk factors, and family history of disease. Counseling  Your health care provider may ask you questions about  your: Alcohol use. Tobacco use. Drug use. Emotional well-being. Home and relationship well-being. Sexual activity. Eating habits. History of falls. Memory and ability to understand (cognition). Work and work Statistician. Reproductive health. Screening  You may have the following tests or measurements: Height, weight, and BMI. Blood pressure. Lipid and cholesterol levels. These may be checked every 5 years, or more frequently if you are over 59 years old. Skin check. Lung cancer screening. You may have this screening every year starting at age 54 if you have a 30-pack-year history of smoking and currently smoke or have quit within the past 15 years. Fecal occult blood test (FOBT) of the stool. You may have this test every year starting at age 54. Flexible sigmoidoscopy or colonoscopy. You may have a sigmoidoscopy every 5 years or a colonoscopy every 10 years starting at age 73. Hepatitis C blood test. Hepatitis B blood test. Sexually transmitted disease (STD) testing. Diabetes screening. This is done by checking your blood sugar (glucose) after you have not eaten for a while (fasting). You may have this done every 1-3 years. Bone density scan. This is done to screen for osteoporosis. You may have this done starting at age 57. Mammogram. This may be done every 1-2 years. Talk to your health care provider about how often you should have regular mammograms. Talk with your health care provider about your test results, treatment options, and if necessary, the need for more tests. Vaccines  Your health care provider may recommend certain vaccines, such as: Influenza vaccine. This is recommended every year. Tetanus, diphtheria, and acellular pertussis (Tdap, Td) vaccine. You may need a Td booster every 10 years. Zoster vaccine. You may need this after age 39. Pneumococcal 13-valent conjugate (PCV13) vaccine. One dose is recommended after age 16. Pneumococcal polysaccharide (PPSV23) vaccine.  One dose is recommended after age 55. Talk to your health care provider about which screenings and vaccines you need and how often you need them. This information is not intended to replace advice given to you by your health care provider. Make sure you discuss any questions you have with your health care provider. Document Released: 07/27/2015 Document Revised: 03/19/2016 Document Reviewed: 05/01/2015 Elsevier Interactive Patient Education  2017 [employer] Prevention in the Home Falls can cause injuries. They can happen to people of all ages. There are many things you can do to make your home safe and to help prevent falls. What can I do on the outside of my home? Regularly fix the edges of walkways and driveways and fix any cracks. Remove anything that might make you trip as you walk through a door, such as a raised step or threshold. Trim any bushes or trees on the path to your home. Use bright outdoor lighting. Clear any walking paths of anything that might make someone trip, such as rocks or tools. Regularly check to see if handrails are loose or broken. Make sure that both sides of any steps have handrails. Any raised decks and porches should have guardrails on the edges. Have any leaves, snow, or ice cleared regularly. Use sand or salt on walking paths during winter. Clean up any spills in your garage right away. This includes oil or grease spills. What can I do in the bathroom? Use night lights. Install grab bars by the toilet and in the tub and shower. Do not use towel bars as grab bars. Use non-skid mats or decals in the tub or shower. If you need to sit down in the shower, use a plastic, non-slip stool. Keep the floor dry. Clean up any water that spills on the floor as soon as it happens. Remove soap buildup in the tub or shower regularly. Attach bath mats securely with double-sided non-slip rug tape. Do not have throw rugs and other things on the floor that can make  you trip. What can I do in the bedroom? Use night lights. Make sure that you have a light by your bed that is easy to reach. Do not use any sheets or blankets that are too big for your bed. They should not hang down onto the floor. Have a firm chair that has side arms. You can use this for support while you get dressed. Do not have throw rugs and other things on the floor that can make you trip. What can I do in the kitchen? Clean up any spills right away. Avoid walking on wet floors. Keep items that you use a lot in easy-to-reach places. If you need to reach something above you, use a strong step stool that has a grab bar. Keep electrical cords out of the way. Do not use floor polish or wax that makes floors slippery. If you must use wax, use non-skid floor wax. Do not have throw rugs and other things on the floor that can make you trip. What can I do with my stairs? Do not leave any items on the stairs. Make sure that there are handrails on both sides of the stairs and use them. Fix handrails that are broken or loose. Make sure that handrails are as long as the stairways. Check any carpeting to make sure that it is firmly attached to the stairs. Fix any carpet that is loose or worn. Avoid having throw rugs at the top or bottom of the  stairs. If you do have throw rugs, attach them to the floor with carpet tape. Make sure that you have a light switch at the top of the stairs and the bottom of the stairs. If you do not have them, ask someone to add them for you. What else can I do to help prevent falls? Wear shoes that: Do not have high heels. Have rubber bottoms. Are comfortable and fit you well. Are closed at the toe. Do not wear sandals. If you use a stepladder: Make sure that it is fully opened. Do not climb a closed stepladder. Make sure that both sides of the stepladder are locked into place. Ask someone to hold it for you, if possible. Clearly mark and make sure that you can  see: Any grab bars or handrails. First and last steps. Where the edge of each step is. Use tools that help you move around (mobility aids) if they are needed. These include: Canes. Walkers. Scooters. Crutches. Turn on the lights when you go into a dark area. Replace any light bulbs as soon as they burn out. Set up your furniture so you have a clear path. Avoid moving your furniture around. If any of your floors are uneven, fix them. If there are any pets around you, be aware of where they are. Review your medicines with your doctor. Some medicines can make you feel dizzy. This can increase your chance of falling. Ask your doctor what other things that you can do to help prevent falls. This information is not intended to replace advice given to you by your health care provider. Make sure you discuss any questions you have with your health care provider. Document Released: 04/26/2009 Document Revised: 12/06/2015 Document Reviewed: 08/04/2014 Elsevier Interactive Patient Education  2017 [employer].

## 2021-05-16 NOTE — Progress Notes (Addendum)
Subjective:   By signing my name below, I, Shehryar Baig, attest that this documentation has been prepared under the direction and in the presence of Dr. Roma Schanz, DO. 05/16/2021    Patient ID: Bethany Miller, female    DOB: 1946-06-12, 75 y.o.   MRN: [id number]  Chief Complaint  Patient presents with  . Hypertension  . Hyperlipidemia  . Follow-up    Hypertension Pertinent negatives include no blurred vision, chest pain, headaches, malaise/fatigue, palpitations or shortness of breath.  Hyperlipidemia Associated symptoms include myalgias (Left lower leg). Pertinent negatives include no chest pain or shortness of breath.  Patient is in today for a office visit.   Leg pain- She reports having left leg pain. She is planning to make an appointment with her orthopedist surgeon to manage her pain.   Lab Results  Component Value Date   CHOL 147 03/08/2021   HDL 36 (L) 03/08/2021   LDLCALC 87 03/08/2021   LDLDIRECT 149.6 01/26/2013   TRIG 137 03/08/2021   CHOLHDL 4.1 03/08/2021   Dental pain- She reports having a abscess in her tooth and is was told to take amoxicillin to manage her infection. She is requesting amoxicillin. Blood pressure- Her blood pressure is doing well during this visit. He continues taking 10-12.5 mg Zestoretic daily PO and reports no new issues while taking it.  Blood sugar- She started taking insulin. She reports gaining weight since she started. She continues taking trulicity once every one.  Lab Results  Component Value Date   HGBA1C 7.3 (A) 04/16/2021   Immunizations- She is interested in receiving the Covid -19 vaccine after this visit.  Dexa- She is not interested in receiving a dexa scan.  Mammogram- She is planning on scheduling an upcomming appointment.   Past Medical History:  Diagnosis Date  . Diabetes mellitus   . Hyperlipidemia   . Hypertension   . Murmur     Past Surgical History:  Procedure Laterality Date  . ABDOMINAL  HYSTERECTOMY    . BACK SURGERY    . CHOLECYSTECTOMY    . TONSILLECTOMY    . TOTAL KNEE ARTHROPLASTY    . TOTAL KNEE ARTHROPLASTY Right 02/26/2017   Procedure: RIGHT TOTAL KNEE ARTHROPLASTY;  Surgeon: Susa Day, MD;  Location: [facility];  Service: Orthopedics;  Laterality: Right;    Family History  Problem Relation Age of Onset  . Throat cancer Brother   . Heart disease Brother        MI  . Cancer Brother 80       throat,   . Hyperlipidemia Brother   . Cirrhosis Father   . Alcohol abuse Father   . Heart disease Brother   . Cancer Brother   . Arthritis Mother   . Hypertension Sister   . Arthritis Sister   . Alzheimer's disease Sister   . Cancer Brother   . Cancer Brother 56       melanoma  . Hypertension Brother   . Hyperlipidemia Brother   . Melanoma Other   . Arthritis Other     Social History   Socioeconomic History  . Marital status: Widowed    Spouse name: Not on file  . Number of children: Not on file  . Years of education: Not on file  . Highest education level: Not on file  Occupational History  . Not on file  Tobacco Use  . Smoking status: Never  . Smokeless tobacco: Never  Substance and Sexual Activity  . Alcohol  use: Yes    Comment: rare  . Drug use: No  . Sexual activity: Not Currently    Partners: Male  Other Topics Concern  . 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 Difficulty of Paying Living Expenses: Not hard at all  Food Insecurity: No Food Insecurity  . Worried About Charity fundraiser in the Last Year: Never true  . Ran Out of Food in the Last Year: Never true  Transportation Needs: No Transportation Needs  . Lack of Transportation (Medical): No  . Lack of Transportation (Non-Medical): No  Physical Activity: Inactive  . Days of Exercise per Week: 0 days  . Minutes of Exercise per  Session: 0 min  Stress: No Stress Concern Present  . Feeling of Stress : Only a little  Social Connections: Moderately Isolated  . Frequency of Communication with Friends and Family: More than three times a week  . Frequency of Social Gatherings with Friends and Family: More than three times a week  . Attends Religious Services: Never  . Active Member of Clubs or Organizations: Yes  . Attends Archivist Meetings: More than 4 times per year  . Marital Status: Widowed  Intimate Partner Violence: Not At Risk  . Fear of Current or Ex-Partner: No  . Emotionally Abused: No  . Physically Abused: No  . Sexually Abused: No    Outpatient Medications Prior to Visit  Medication Sig Dispense Refill  . acetaminophen (TYLENOL) 650 MG CR tablet Take 650 mg by mouth every 8 (eight) hours as needed for pain.    . Ascorbic Acid (VITAMIN C) 100 MG tablet Take 100 mg by mouth daily.    Marland Kitchen aspirin EC 81 MG tablet Take 81 mg by mouth daily.    Marland Kitchen atorvastatin (LIPITOR) 40 MG tablet TAKE 1 TABLET DAILY 90 tablet 1  . cholecalciferol (VITAMIN D3) 25 MCG (1000 UNIT) tablet Take 2,000 Units by mouth daily.    . citalopram (CELEXA) 40 MG tablet TAKE 1 TABLET EVERY DAY 90 tablet 3  . docusate sodium (COLACE) 100 MG capsule Take 1 capsule (100 mg total) by mouth 2 (two) times daily as needed for mild constipation. 30 capsule 1  . Dulaglutide (TRULICITY) 1.5 QQ/7.6PP SOPN Inject 1.5 mg into the skin once a week. 6 mL 3  . fenofibrate 160 MG tablet Take 1 tablet (160 mg total) by mouth daily. 90 tablet 1  . fluticasone (FLONASE) 50 MCG/ACT nasal spray Place 2 sprays into both nostrils daily. 16 g 6  . glipiZIDE (GLUCOTROL) 5 MG tablet Take 1 tablet (5 mg total) by mouth daily before supper. 90 tablet 3  . glucose blood (ACCU-CHEK GUIDE) test strip USE 1 STRIP TO CHECK GLUCOSE ONCE DAILY 100 each 4  . Insulin Admin Supplies MISC Use to inject insulin 2 times a day. 180 each 0  . insulin glargine (LANTUS  SOLOSTAR) 100 UNIT/ML Solostar Pen Inject 12 Units into the skin daily. 15 mL 11  . Insulin Pen Needle 32G X 4 MM MISC Use 1x a day 100 each 3  . Lancets (ACCU-CHEK MULTICLIX) lancets Use as instructed to test once a day DX E11.51 100 each 1  . lisinopril-hydrochlorothiazide (ZESTORETIC) 10-12.5 MG tablet TAKE 1 TABLET DAILY 90 tablet 1  . Loratadine-Pseudoephedrine (CLARITIN-D 12 HOUR PO)  Take 1 tablet by mouth daily as needed (sinus).     . meloxicam (MOBIC) 7.5 MG tablet TAKE 1 TABLET DAILY 90 tablet 1  . metFORMIN (GLUCOPHAGE-XR) 500 MG 24 hr tablet Take 1 tablet (500 mg total) by mouth 2 (two) times daily. 180 tablet 3  . Multiple Vitamins-Minerals (MULTIVITAMIN ADULT PO) Take 1 tablet by mouth daily.     Marland Kitchen omeprazole (PRILOSEC) 20 MG capsule Take 1 capsule (20 mg total) by mouth daily. 90 capsule 3  . traZODone (DESYREL) 50 MG tablet TAKE 1/2 TO 1 (ONE-HALF TO ONE) TABLET BY MOUTH AT BEDTIME AS NEEDED FOR SLEEP 90 tablet 1  . vitamin B-12 (CYANOCOBALAMIN) 100 MCG tablet Take 100 mcg by mouth daily.    Marland Kitchen zinc gluconate 50 MG tablet Take 50 mg by mouth daily.    Marland Kitchen BINAXNOW COVID-19 AG HOME TEST KIT See admin instructions.    Marland Kitchen ibuprofen (ADVIL,MOTRIN) 800 MG tablet Take 800 mg by mouth 3 (three) times daily as needed for moderate pain.     No facility-administered medications prior to visit.    Allergies  Allergen Reactions  . Ozempic (0.25 Or 0.5 Mg-Dose) [Semaglutide(0.25 Or 0.5mg -Dos)] Diarrhea and Nausea And Vomiting    Pt stated, "I got dehydrated from this as well"    Review of Systems  Constitutional:  Negative for fever and malaise/fatigue.  HENT:  Negative for congestion.   Eyes:  Negative for blurred vision.  Respiratory:  Negative for shortness of breath.   Cardiovascular:  Negative for chest pain, palpitations and leg swelling.  Gastrointestinal:  Negative for abdominal pain, blood in stool and nausea.  Genitourinary:  Negative for dysuria and frequency.   Musculoskeletal:  Positive for myalgias (Left lower leg). Negative for falls.  Skin:  Negative for rash.  Neurological:  Negative for dizziness, loss of consciousness and headaches.  Endo/Heme/Allergies:  Negative for environmental allergies.  Psychiatric/Behavioral:  Negative for depression. The patient is not nervous/anxious.       Objective:    Physical Exam Vitals and nursing note reviewed.  Constitutional:      General: She is not in acute distress.    Appearance: Normal appearance. She is not ill-appearing.  HENT:     Head: Normocephalic and atraumatic.     Right Ear: External ear normal.     Left Ear: External ear normal.  Eyes:     Extraocular Movements: Extraocular movements intact.     Pupils: Pupils are equal, round, and reactive to light.  Cardiovascular:     Rate and Rhythm: Normal rate and regular rhythm.     Heart sounds: Normal heart sounds. No murmur heard.   No gallop.  Pulmonary:     Effort: Pulmonary effort is normal. No respiratory distress.     Breath sounds: Normal breath sounds. No wheezing or rales.  Skin:    General: Skin is warm and dry.  Neurological:     Mental Status: She is alert and oriented to person, place, and time.  Psychiatric:        Behavior: Behavior normal.        Judgment: Judgment normal.    BP 130/62   Pulse (!) 59   Temp 97.9 F (36.6 C)   Resp 16   Ht 5\' 2"  (1.575 m)   Wt 185 lb (83.9 kg)   SpO2 98%   BMI 33.84 kg/m  Wt Readings from Last 3 Encounters:  05/16/21 185 lb (83.9 kg)  05/16/21 185 lb 3.2 oz (  84 kg)  04/16/21 185 lb (83.9 kg)    Diabetic Foot Exam - Simple   No data filed    Lab Results  Component Value Date   WBC 7.8 03/08/2021   HGB 12.0 03/08/2021   HCT 35.8 03/08/2021   PLT 308 03/08/2021   GLUCOSE 186 (H) 03/08/2021   CHOL 147 03/08/2021   TRIG 137 03/08/2021   HDL 36 (L) 03/08/2021   LDLDIRECT 149.6 01/26/2013   LDLCALC 87 03/08/2021   ALT 11 03/08/2021   AST 11 03/08/2021   NA 137  03/08/2021   K 4.6 03/08/2021   CL 102 03/08/2021   CREATININE 1.42 (H) 03/08/2021   BUN 26 (H) 03/08/2021   CO2 25 03/08/2021   TSH 1.65 03/08/2021   INR 0.96 02/17/2017   HGBA1C 7.3 (A) 04/16/2021   MICROALBUR 0.6 03/28/2016    Lab Results  Component Value Date   TSH 1.65 03/08/2021   Lab Results  Component Value Date   WBC 7.8 03/08/2021   HGB 12.0 03/08/2021   HCT 35.8 03/08/2021   MCV 87.7 03/08/2021   PLT 308 03/08/2021   Lab Results  Component Value Date   NA 137 03/08/2021   K 4.6 03/08/2021   CO2 25 03/08/2021   GLUCOSE 186 (H) 03/08/2021   BUN 26 (H) 03/08/2021   CREATININE 1.42 (H) 03/08/2021   BILITOT 0.4 03/08/2021   ALKPHOS 51 10/15/2020   AST 11 03/08/2021   ALT 11 03/08/2021   PROT 6.5 03/08/2021   ALBUMIN 4.0 10/15/2020   CALCIUM 9.3 03/08/2021   ANIONGAP 13 11/25/2018   GFR 29.87 (L) 09/17/2020   Lab Results  Component Value Date   CHOL 147 03/08/2021   Lab Results  Component Value Date   HDL 36 (L) 03/08/2021   Lab Results  Component Value Date   LDLCALC 87 03/08/2021   Lab Results  Component Value Date   TRIG 137 03/08/2021   Lab Results  Component Value Date   CHOLHDL 4.1 03/08/2021   Lab Results  Component Value Date   HGBA1C 7.3 (A) 04/16/2021       Assessment & Plan:   Problem List Items Addressed This Visit       Unprioritized   ESSENTIAL HYPERTENSION, BENIGN    Well controlled, no changes to meds. Encouraged heart healthy diet such as the DASH diet and exercise as tolerated.       Hyperlipidemia associated with type 2 diabetes mellitus ([place])    Tolerating statin, encouraged heart healthy diet, avoid trans fats, minimize simple carbs and saturated fats. Increase exercise as tolerated      Uncontrolled type 2 diabetes mellitus with hyperglycemia ([place])    Per endo      Other Visit Diagnoses     Primary hypertension    -  Primary   Relevant Orders   Comprehensive metabolic panel   Lipid panel   CBC with  Differential/Platelet   Hyperlipidemia, unspecified hyperlipidemia type       Relevant Orders   Comprehensive metabolic panel   Lipid panel   CBC with Differential/Platelet   Chronic pain of left knee       Relevant Orders   Ambulatory referral to Orthopedic Surgery   Prophylactic antibiotic       Relevant Medications   amoxicillin (AMOXIL) 500 MG capsule   Estrogen deficiency       Relevant Orders   DG Bone Density        Meds ordered this  encounter  Medications  . amoxicillin (AMOXIL) 500 MG capsule    Sig: 4 po 1 hr prior to procedure    Dispense:  4 capsule    Refill:  2    I, Dr. Roma Schanz, DO, personally preformed the services described in this documentation.  All medical record entries made by the scribe were at my direction and in my presence.  I have reviewed the chart and discharge instructions (if applicable) and agree that the record reflects my personal performance and is accurate and complete. 05/16/2021   I,Shehryar Baig,acting as a scribe for Ann Held, DO.,have documented all relevant documentation on the behalf of Ann Held, DO,as directed by  Ann Held, DO while in the presence of Ann Held, DO.   Ann Held, DO

## 2021-05-16 NOTE — Progress Notes (Signed)
Subjective:   Bethany Miller is a 75 y.o. female who presents for Medicare Annual (Subsequent) preventive examination.  Review of Systems     Cardiac Risk Factors include: advanced age (>45mn, >>82women);diabetes mellitus;dyslipidemia;hypertension;obesity (BMI >30kg/m2)     Objective:    Today's Vitals   05/16/21 0856 05/16/21 0859  BP: 130/62   Pulse: (!) 59   Resp: 16   Temp: 97.9 F (36.6 C)   TempSrc: Temporal   SpO2: 98%   Weight: 185 lb 3.2 oz (84 kg)   Height: 5' 2"  (1.575 m)   PainSc:  2    Body mass index is 33.87 kg/m.  Advanced Directives 05/16/2021 11/09/2020 04/07/2019 11/25/2018 10/13/2017 04/21/2017 02/26/2017  Does Patient Have a Medical Advance Directive? No Yes No No No No No  Would patient like information on creating a medical advance directive? Yes (MAU/Ambulatory/Procedural Areas - Information given) - Yes (MAU/Ambulatory/Procedural Areas - Information given) - Yes (MAU/Ambulatory/Procedural Areas - Information given) - No - Patient declined    Current Medications (verified) Outpatient Encounter Medications as of 05/16/2021  Medication Sig   acetaminophen (TYLENOL) 650 MG CR tablet Take 650 mg by mouth every 8 (eight) hours as needed for pain.   Ascorbic Acid (VITAMIN C) 100 MG tablet Take 100 mg by mouth daily.   aspirin EC 81 MG tablet Take 81 mg by mouth daily.   atorvastatin (LIPITOR) 40 MG tablet TAKE 1 TABLET DAILY   cholecalciferol (VITAMIN D3) 25 MCG (1000 UNIT) tablet Take 2,000 Units by mouth daily.   citalopram (CELEXA) 40 MG tablet TAKE 1 TABLET EVERY DAY   docusate sodium (COLACE) 100 MG capsule Take 1 capsule (100 mg total) by mouth 2 (two) times daily as needed for mild constipation.   Dulaglutide (TRULICITY) 1.5 MJI/9.6VESOPN Inject 1.5 mg into the skin once a week.   fenofibrate 160 MG tablet Take 1 tablet (160 mg total) by mouth daily.   fluticasone (FLONASE) 50 MCG/ACT nasal spray Place 2 sprays into both nostrils daily.   glipiZIDE  (GLUCOTROL) 5 MG tablet Take 1 tablet (5 mg total) by mouth daily before supper.   glucose blood (ACCU-CHEK GUIDE) test strip USE 1 STRIP TO CHECK GLUCOSE ONCE DAILY   Insulin Admin Supplies MISC Use to inject insulin 2 times a day.   insulin glargine (LANTUS SOLOSTAR) 100 UNIT/ML Solostar Pen Inject 12 Units into the skin daily.   Insulin Pen Needle 32G X 4 MM MISC Use 1x a day   Lancets (ACCU-CHEK MULTICLIX) lancets Use as instructed to test once a day DX E11.51   lisinopril-hydrochlorothiazide (ZESTORETIC) 10-12.5 MG tablet TAKE 1 TABLET DAILY   Loratadine-Pseudoephedrine (CLARITIN-D 12 HOUR PO) Take 1 tablet by mouth daily as needed (sinus).    meloxicam (MOBIC) 7.5 MG tablet TAKE 1 TABLET DAILY   metFORMIN (GLUCOPHAGE-XR) 500 MG 24 hr tablet Take 1 tablet (500 mg total) by mouth 2 (two) times daily.   Multiple Vitamins-Minerals (MULTIVITAMIN ADULT PO) Take 1 tablet by mouth daily.    omeprazole (PRILOSEC) 20 MG capsule Take 1 capsule (20 mg total) by mouth daily.   traZODone (DESYREL) 50 MG tablet TAKE 1/2 TO 1 (ONE-HALF TO ONE) TABLET BY MOUTH AT BEDTIME AS NEEDED FOR SLEEP   vitamin B-12 (CYANOCOBALAMIN) 100 MCG tablet Take 100 mcg by mouth daily.   zinc gluconate 50 MG tablet Take 50 mg by mouth daily.   BINAXNOW COVID-19 AG HOME TEST KIT See admin instructions. (Patient not taking: No sig  reported)   ibuprofen (ADVIL,MOTRIN) 800 MG tablet Take 800 mg by mouth 3 (three) times daily as needed for moderate pain. (Patient not taking: No sig reported)   No facility-administered encounter medications on file as of 05/16/2021.    Allergies (verified) Ozempic (0.25 or 0.5 mg-dose) [semaglutide(0.25 or 0.17m-dos)]   History: Past Medical History:  Diagnosis Date   Diabetes mellitus    Hyperlipidemia    Hypertension    Murmur    Past Surgical History:  Procedure Laterality Date   ABDOMINAL HYSTERECTOMY     BACK SURGERY     CHOLECYSTECTOMY     TONSILLECTOMY     TOTAL KNEE  ARTHROPLASTY     TOTAL KNEE ARTHROPLASTY Right 02/26/2017   Procedure: RIGHT TOTAL KNEE ARTHROPLASTY;  Surgeon: BSusa Day MD;  Location: [facility];  Service: Orthopedics;  Laterality: Right;   Family History  Problem Relation Age of Onset   Throat cancer Brother    Heart disease Brother        MI   Cancer Brother 885      throat,    Hyperlipidemia Brother    Cirrhosis Father    Alcohol abuse Father    Heart disease Brother    Cancer Brother    Arthritis Mother    Hypertension Sister    Arthritis Sister    Alzheimer's disease Sister    Cancer Brother    Cancer Brother 582      melanoma   Hypertension Brother    Hyperlipidemia Brother    Melanoma Other    Arthritis Other    Social History   Socioeconomic History   Marital status: Widowed    Spouse name: Not on file   Number of children: Not on file   Years of education: Not on file   Highest education level: Not on file  Occupational History   Not on file  Tobacco Use   Smoking status: Never   Smokeless tobacco: Never  Substance and Sexual Activity   Alcohol use: Yes    Comment: rare   Drug use: No   Sexual activity: Not Currently    Partners: Male  Other Topics Concern   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   Caffeine use: coffee in the am   Social Determinants of Health   Financial Resource Strain: Low Risk    Difficulty of Paying Living Expenses: Not hard at all  Food Insecurity: No Food Insecurity   Worried About RCharity fundraiserin the Last Year: Never true   [place] Last [place]: Never true  Transportation Needs: No Transportation Needs   Lack of Transportation (Medical): No   Lack of Transportation (Non-Medical): No  Physical Activity: Inactive   Days of Exercise per Week: 0 days   Minutes of Exercise per Session: 0 min  Stress: No Stress Concern Present   Feeling of Stress : Only a little  Social Connections: Moderately Isolated    Frequency of Communication with Friends and Family: More than three times a week   Frequency of Social Gatherings with Friends and Family: More than three times a week   Attends Religious Services: Never   AMarine scientistor Organizations: Yes   Attends CMusic therapist More than 4 times per year   Marital Status: Widowed    Tobacco Counseling Counseling given: Not Answered   Clinical Intake:  Pre-visit preparation  completed: Yes  Pain : 0-10 Pain Score: 2  Pain Type: Acute pain Pain Location: Leg Pain Orientation: Left Pain Onset: 1 to 4 weeks ago Pain Frequency: Constant     BMI - recorded: 33.87 Nutritional Status: BMI > 30  Obese Nutritional Risks: None Diabetes: Yes CBG done?: No Did pt. bring in CBG monitor from home?: No  How often do you need to have someone help you when you read instructions, pamphlets, or other written materials from your doctor or pharmacy?: 1 - Never Diabetes:  Is the patient diabetic?  Yes  If diabetic, was a CBG obtained today?  No  Did the patient bring in their glucometer from home?  No  How often do you monitor your CBG's? daily.   Financial Strains and Diabetes Management:  Are you having any financial strains with the device, your supplies or your medication? No .  Does the patient want to be seen by Chronic Care Management for management of their diabetes?  No  Would the patient like to be referred to a Nutritionist or for Diabetic Management?  No   Diabetic Exams:  Diabetic Eye Exam: Overdue for diabetic eye exam. Pt has been advised about the importance in completing this exam. Patient plans to make an appt  Diabetic Foot Exam: Pt has been advised about the importance in completing this exam. To be completed by PCP   Interpreter Needed?: No  Information entered by :: Caroleen Hamman LPN   Activities of Daily Living In your present state of health, do you have any difficulty performing the  following activities: 05/16/2021  Hearing? N  Vision? N  Difficulty concentrating or making decisions? N  Walking or climbing stairs? N  Dressing or bathing? N  Doing errands, shopping? N  Preparing Food and eating ? N  Using the Toilet? N  In the past six months, have you accidently leaked urine? N  Do you have problems with loss of bowel control? N  Managing your Medications? N  Managing your Finances? N  Housekeeping or managing your Housekeeping? N  Some recent data might be hidden    Patient Care Team: Carollee Herter, Alferd Apa, DO as PCP - General Philemon Kingdom, MD as Consulting Physician (Internal Medicine) Roseanne Kaufman, MD as Consulting Physician (Orthopedic Surgery) Day, Melvenia Beam, [facility] (Inactive) as Pharmacist (Pharmacist)  Indicate any recent Medical Services you may have received from other than [facility] providers in the past year (date may be approximate).     Assessment:   This is a routine wellness examination for [place].  Hearing/Vision screen Hearing Screening - Comments:: No issues Vision Screening - Comments:: Last eye exam -over a year ago  Dietary issues and exercise activities discussed: Current Exercise Habits: The patient does not participate in regular exercise at present, Exercise limited by: orthopedic condition(s)   Goals Addressed             This Visit's Progress    Increase physical activity   Not on track    Patient Stated       Eat healthy       Depression Screen PHQ 2/9 Scores 05/16/2021 11/13/2020 11/09/2020 11/29/2019 04/07/2019 03/02/2018 10/13/2017  PHQ - 2 Score 0 0 0 1 0 0 0  PHQ- 9 Score - 0 - 2 - 2 -    Fall Risk Fall Risk  05/16/2021 11/09/2020 05/03/2020 04/07/2019 10/13/2017  Falls in the past year? 0 0 0 1 No  Comment - - - tripped in flip  flops and tripped over hose in yard -  Number falls in past yr: 0 - 0 1 -  Injury with Fall? 0 - 0 1 -  Follow up Falls prevention discussed - Falls evaluation completed Education provided;Falls  prevention discussed -    FALL RISK PREVENTION PERTAINING TO THE HOME:  Any stairs in or around the home? No  Home free of loose throw rugs in walkways, pet beds, electrical cords, etc? Yes  Adequate lighting in your home to reduce risk of falls? Yes   ASSISTIVE DEVICES UTILIZED TO PREVENT FALLS:  Life alert? No  Use of a cane, walker or w/c? No  Grab bars in the bathroom? Yes  Shower chair or bench in shower? No  Elevated toilet seat or a handicapped toilet? No   TIMED UP AND GO:  Was the test performed? Yes .  Length of time to ambulate 10 feet: 10 sec.   Gait steady and fast without use of assistive device  Cognitive Function:Normal cognitive status assessed by direct observation by this Nurse Health Advisor. No abnormalities found.   MMSE - Mini Mental State Exam 10/13/2017  Orientation to time 5  Orientation to Place 5  Registration 3  Attention/ Calculation 5  Recall 3  Language- name 2 objects 2  Language- repeat 1  Language- follow 3 step command 3  Language- read & follow direction 1  Write a sentence 1  Copy design 1  Total score 30        Immunizations Immunization History  Administered Date(s) Administered   Fluad Quad(high Dose 65+) 04/01/2019, 05/03/2020   Influenza Split 05/20/2011, 03/24/2012   Influenza Whole 04/16/2009, 04/30/2010   Influenza, High Dose Seasonal PF 03/28/2016, 04/29/2017, 03/31/2018   Influenza,inj,Quad PF,6+ Mos 04/08/2013   Influenza-Unspecified 04/18/2021   PFIZER(Purple Top)SARS-COV-2 Vaccination 08/04/2019, 08/25/2019   Pneumococcal Conjugate-13 03/28/2016   Pneumococcal Polysaccharide-23 04/01/2019   Td 11/26/2009   Zoster, Live 01/02/2010    TDAP status: Due, Education has been provided regarding the importance of this vaccine. Advised may receive this vaccine at local pharmacy or Health Dept. Aware to provide a copy of the vaccination record if obtained from local pharmacy or Health Dept. Verbalized acceptance and  understanding.  Flu Vaccine status: Up to date  Pneumococcal vaccine status: Up to date  Covid-19 vaccine status: Information provided on how to obtain vaccines.   Qualifies for Shingles Vaccine? Yes   Zostavax completed Yes   Shingrix Completed?: No.    Education has been provided regarding the importance of this vaccine. Patient has been advised to call insurance company to determine out of pocket expense if they have not yet received this vaccine. Advised may also receive vaccine at local pharmacy or Health Dept. Verbalized acceptance and understanding.  Screening Tests Health Maintenance  Topic Date Due   Zoster Vaccines- Shingrix (1 of 2) Never done   DEXA SCAN  Never done   COVID-19 Vaccine (3 - Booster for Pfizer series) 10/20/2019   TETANUS/TDAP  11/27/2019   OPHTHALMOLOGY EXAM  05/03/2020   FOOT EXAM  05/03/2021   HEMOGLOBIN A1C  10/15/2021   MAMMOGRAM  01/30/2022   Fecal DNA (Cologuard)  08/06/2023   Pneumonia Vaccine 38+ Years old  Completed   INFLUENZA VACCINE  Completed   Hepatitis C Screening  Completed   HPV VACCINES  Aged Out    Health Maintenance  Health Maintenance Due  Topic Date Due   Zoster Vaccines- Shingrix (1 of 2) Never done   DEXA  SCAN  Never done   COVID-19 Vaccine (3 - Booster for Pfizer series) 10/20/2019   TETANUS/TDAP  11/27/2019   OPHTHALMOLOGY EXAM  05/03/2020   FOOT EXAM  05/03/2021    Colorectal cancer screening: Type of screening: Cologuard. Completed 08/05/2020. Repeat every 3 years  Mammogram status: Ordered today. Pt provided with contact info and advised to call to schedule appt.   Bone Density status: Declined  Lung Cancer Screening: (Low Dose CT Chest recommended if Age 22-80 years, 30 pack-year currently smoking OR have quit w/in 15years.) does not qualify.     Additional Screening:  Hepatitis C Screening: Completed 03/24/2012  Vision Screening: Recommended annual ophthalmology exams for early detection of glaucoma and  other disorders of the eye. Is the patient up to date with their annual eye exam?  No  Who is the provider or what is the name of the office in which the patient attends annual eye exams? Pt unsure of name   Dental Screening: Recommended annual dental exams for proper oral hygiene  Community Resource Referral / Chronic Care Management: CRR required this visit?  No   CCM required this visit?  No      Plan:     I have personally reviewed and noted the following in the patient's chart:   Medical and social history Use of alcohol, tobacco or illicit drugs  Current medications and supplements including opioid prescriptions.  Functional ability and status Nutritional status Physical activity Advanced directives List of other physicians Hospitalizations, surgeries, and ER visits in previous 12 months Vitals Screenings to include cognitive, depression, and falls Referrals and appointments  In addition, I have reviewed and discussed with patient certain preventive protocols, quality metrics, and best practice recommendations. A written personalized care plan for preventive services as well as general preventive health recommendations were provided to patient.   Patient to access avs on mychart.  Marta Antu, LPN   15/01/2619  Nurse Health Advisor  Nurse Notes: None

## 2021-05-16 NOTE — Assessment & Plan Note (Signed)
Per endo °

## 2021-05-30 ENCOUNTER — Ambulatory Visit: Payer: Medicare Other | Admitting: Podiatry

## 2021-06-03 DIAGNOSIS — E1122 Type 2 diabetes mellitus with diabetic chronic kidney disease: Secondary | ICD-10-CM | POA: Diagnosis not present

## 2021-06-03 DIAGNOSIS — M1990 Unspecified osteoarthritis, unspecified site: Secondary | ICD-10-CM | POA: Diagnosis not present

## 2021-06-03 DIAGNOSIS — E785 Hyperlipidemia, unspecified: Secondary | ICD-10-CM | POA: Diagnosis not present

## 2021-06-03 DIAGNOSIS — N1832 Chronic kidney disease, stage 3b: Secondary | ICD-10-CM | POA: Diagnosis not present

## 2021-06-03 DIAGNOSIS — I129 Hypertensive chronic kidney disease with stage 1 through stage 4 chronic kidney disease, or unspecified chronic kidney disease: Secondary | ICD-10-CM | POA: Diagnosis not present

## 2021-06-04 ENCOUNTER — Other Ambulatory Visit (HOSPITAL_BASED_OUTPATIENT_CLINIC_OR_DEPARTMENT_OTHER): Payer: Self-pay

## 2021-06-04 DIAGNOSIS — Z23 Encounter for immunization: Secondary | ICD-10-CM | POA: Diagnosis not present

## 2021-06-04 MED ORDER — PFIZER COVID-19 VAC BIVALENT 30 MCG/0.3ML IM SUSP
INTRAMUSCULAR | 0 refills | Status: DC
  Filled 2021-06-04: qty 0.3, 1d supply, fill #0

## 2021-07-01 ENCOUNTER — Ambulatory Visit (HOSPITAL_BASED_OUTPATIENT_CLINIC_OR_DEPARTMENT_OTHER)
Admission: RE | Admit: 2021-07-01 | Discharge: 2021-07-01 | Disposition: A | Discharge status: Home / Self Care | Payer: Medicare Other | Source: Ambulatory Visit | Attending: Family Medicine | Admitting: Family Medicine

## 2021-07-01 ENCOUNTER — Other Ambulatory Visit: Payer: Self-pay

## 2021-07-01 ENCOUNTER — Encounter (HOSPITAL_BASED_OUTPATIENT_CLINIC_OR_DEPARTMENT_OTHER): Payer: Self-pay

## 2021-07-01 DIAGNOSIS — Z1231 Encounter for screening mammogram for malignant neoplasm of breast: Secondary | ICD-10-CM

## 2021-07-11 ENCOUNTER — Encounter: Payer: Self-pay | Admitting: Internal Medicine

## 2021-07-22 ENCOUNTER — Ambulatory Visit: Payer: Self-pay | Admitting: Pharmacist

## 2021-07-22 DIAGNOSIS — E1165 Type 2 diabetes mellitus with hyperglycemia: Secondary | ICD-10-CM

## 2021-07-22 NOTE — Chronic Care Management (AMB) (Signed)
Updated Chronic Care Management records. Looks like patient was enrolled in Chronic Care Management program 11/2019. She is still showing as enrolled but appears she cancelled appointment for 01/2020 states that she did not wish to reschedule.  Goals updated and removed clinical pharmacist from provider list.  Patient can re-enroll or re-engage with pharmacist if needed in the future.

## 2021-08-08 ENCOUNTER — Encounter: Payer: Self-pay | Admitting: Family Medicine

## 2021-08-08 ENCOUNTER — Ambulatory Visit (INDEPENDENT_AMBULATORY_CARE_PROVIDER_SITE_OTHER): Payer: Medicare Other | Admitting: Family Medicine

## 2021-08-08 VITALS — BP 126/60 | HR 62 | Temp 97.5°F | Resp 18 | Ht 62.0 in | Wt 193.4 lb

## 2021-08-08 DIAGNOSIS — E785 Hyperlipidemia, unspecified: Secondary | ICD-10-CM

## 2021-08-08 DIAGNOSIS — Z808 Family history of malignant neoplasm of other organs or systems: Secondary | ICD-10-CM | POA: Diagnosis not present

## 2021-08-08 DIAGNOSIS — J40 Bronchitis, not specified as acute or chronic: Secondary | ICD-10-CM | POA: Diagnosis not present

## 2021-08-08 MED ORDER — AZITHROMYCIN 250 MG PO TABS: ORAL_TABLET | ORAL | 0 refills | Status: DC

## 2021-08-08 MED ORDER — BENZONATATE 100 MG PO CAPS: 100.0000 mg | ORAL_CAPSULE | Freq: Three times a day (TID) | ORAL | 0 refills | Status: DC | PRN

## 2021-08-08 MED ORDER — FENOFIBRATE 160 MG PO TABS: 160.0000 mg | ORAL_TABLET | Freq: Every day | ORAL | 1 refills | Status: DC

## 2021-08-08 NOTE — Progress Notes (Signed)
Established Patient Office Visit  Subjective:  Patient ID: Bethany Miller, female    DOB: August 21, 1945  Age: 76 y.o. MRN: [id number]  CC:  Chief Complaint  Patient presents with   Cough    Pt states having sxs since Christmas. Pt states cough comes and goes. Pt states productive cough and fatigue. Pt states no fever.     HPI [name] presents for congestion and cough since Christmas.  No fever.   Cough is productive --- yellow.  She is taking mucinex and flonase --- dayquil and nyquil with no relief.  She is also hoarse  Past Medical History:  Diagnosis Date   Diabetes mellitus    Hyperlipidemia    Hypertension    Murmur     Past Surgical History:  Procedure Laterality Date   ABDOMINAL HYSTERECTOMY     BACK SURGERY     CHOLECYSTECTOMY     TONSILLECTOMY     TOTAL KNEE ARTHROPLASTY     TOTAL KNEE ARTHROPLASTY Right 02/26/2017   Procedure: RIGHT TOTAL KNEE ARTHROPLASTY;  Surgeon: Susa Day, MD;  Location: [facility];  Service: Orthopedics;  Laterality: Right;    Family History  Problem Relation Age of Onset   Throat cancer Brother    Heart disease Brother        MI   Cancer Brother 60       throat,    Hyperlipidemia Brother    Cirrhosis Father    Alcohol abuse Father    Heart disease Brother    Cancer Brother    Arthritis Mother    Hypertension Sister    Arthritis Sister    Alzheimer's disease Sister    Cancer Brother    Cancer Brother 21       melanoma   Hypertension Brother    Hyperlipidemia Brother    Melanoma Other    Arthritis Other     Social History   Socioeconomic History   Marital status: Widowed    Spouse name: Not on file   Number of children: Not on file   Years of education: Not on file   Highest education level: Not on file  Occupational History   Not on file  Tobacco Use   Smoking status: Never   Smokeless tobacco: Never  Substance and Sexual Activity   Alcohol use: Yes    Comment: rare   Drug use: No   Sexual activity: Not  Currently    Partners: Male  Other Topics Concern   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   Caffeine use: coffee in the am   Social Determinants of Health   Financial Resource Strain: Low Risk    Difficulty of Paying Living Expenses: Not hard at all  Food Insecurity: No Food Insecurity   Worried About Charity fundraiser in the Last Year: Never true   [place] in the Last Year: Never true  Transportation Needs: No Transportation Needs   Lack of Transportation (Medical): No   Lack of Transportation (Non-Medical): No  Physical Activity: Inactive   Days of Exercise per Week: 0 days   Minutes of Exercise per Session: 0 min  Stress: No Stress Concern Present   Feeling of Stress : Only a little  Social Connections: Moderately Isolated   Frequency of Communication with Friends and Family: More than three times a week   Frequency of Social Gatherings with Friends and Family: More  than three times a week   Attends Religious Services: Never   Active Member of Clubs or Organizations: Yes   Attends Archivist Meetings: More than 4 times per year   Marital Status: Widowed  Human resources officer Violence: Not At Risk   Fear of Current or Ex-Partner: No   Emotionally Abused: No   Physically Abused: No   Sexually Abused: No    Outpatient Medications Prior to Visit  Medication Sig Dispense Refill   acetaminophen (TYLENOL) 650 MG CR tablet Take 650 mg by mouth every 8 (eight) hours as needed for pain.     amoxicillin (AMOXIL) 500 MG capsule 4 po 1 hr prior to procedure 4 capsule 2   Ascorbic Acid (VITAMIN C) 100 MG tablet Take 100 mg by mouth daily.     aspirin EC 81 MG tablet Take 81 mg by mouth daily.     atorvastatin (LIPITOR) 40 MG tablet TAKE 1 TABLET DAILY 90 tablet 1   cholecalciferol (VITAMIN D3) 25 MCG (1000 UNIT) tablet Take 2,000 Units by mouth daily.     citalopram (CELEXA) 40 MG tablet TAKE 1 TABLET EVERY DAY 90 tablet  3   docusate sodium (COLACE) 100 MG capsule Take 1 capsule (100 mg total) by mouth 2 (two) times daily as needed for mild constipation. 30 capsule 1   Dulaglutide (TRULICITY) 1.5 DG/3.8VF SOPN Inject 1.5 mg into the skin once a week. 6 mL 3   fluticasone (FLONASE) 50 MCG/ACT nasal spray Place 2 sprays into both nostrils daily. 16 g 6   glipiZIDE (GLUCOTROL) 5 MG tablet Take 1 tablet (5 mg total) by mouth daily before supper. 90 tablet 3   glucose blood (ACCU-CHEK GUIDE) test strip USE 1 STRIP TO CHECK GLUCOSE ONCE DAILY 100 each 4   Insulin Admin Supplies MISC Use to inject insulin 2 times a day. 180 each 0   insulin glargine (LANTUS SOLOSTAR) 100 UNIT/ML Solostar Pen Inject 12 Units into the skin daily. 15 mL 11   Insulin Pen Needle 32G X 4 MM MISC Use 1x a day 100 each 3   Lancets (ACCU-CHEK MULTICLIX) lancets Use as instructed to test once a day DX E11.51 100 each 1   lisinopril-hydrochlorothiazide (ZESTORETIC) 10-12.5 MG tablet TAKE 1 TABLET DAILY 90 tablet 1   Loratadine-Pseudoephedrine (CLARITIN-D 12 HOUR PO) Take 1 tablet by mouth daily as needed (sinus).      meloxicam (MOBIC) 7.5 MG tablet TAKE 1 TABLET DAILY 90 tablet 1   metFORMIN (GLUCOPHAGE-XR) 500 MG 24 hr tablet Take 1 tablet (500 mg total) by mouth 2 (two) times daily. 180 tablet 3   Multiple Vitamins-Minerals (MULTIVITAMIN ADULT PO) Take 1 tablet by mouth daily.      omeprazole (PRILOSEC) 20 MG capsule Take 1 capsule (20 mg total) by mouth daily. 90 capsule 3   traZODone (DESYREL) 50 MG tablet TAKE 1/2 TO 1 (ONE-HALF TO ONE) TABLET BY MOUTH AT BEDTIME AS NEEDED FOR SLEEP 90 tablet 1   vitamin B-12 (CYANOCOBALAMIN) 100 MCG tablet Take 100 mcg by mouth daily.     zinc gluconate 50 MG tablet Take 50 mg by mouth daily.     fenofibrate 160 MG tablet Take 1 tablet (160 mg total) by mouth daily. 90 tablet 1   COVID-19 mRNA bivalent vaccine, Pfizer, (PFIZER COVID-19 VAC BIVALENT) injection Inject into the muscle. (Patient not taking:  Reported on 08/08/2021) 0.3 mL 0   No facility-administered medications prior to visit.    Allergies  Allergen  Reactions   Ozempic (0.25 Or 0.5 Mg-Dose) [Semaglutide(0.25 Or 0.5mg -Dos)] Diarrhea and Nausea And Vomiting    Pt stated, "I got dehydrated from this as well"    ROS Review of Systems  Constitutional:  Negative for chills and fever.  HENT:  Positive for congestion, postnasal drip, rhinorrhea and sinus pressure.   Respiratory:  Positive for cough and chest tightness. Negative for shortness of breath and wheezing.   Cardiovascular:  Negative for chest pain, palpitations and leg swelling.  Allergic/Immunologic: Negative for environmental allergies.     Objective:    Physical Exam Vitals and nursing note reviewed.  Constitutional:      Appearance: She is well-developed.  HENT:     Right Ear: External ear normal.     Left Ear: External ear normal.     Nose:     Right Sinus: No maxillary sinus tenderness.     Left Sinus: No maxillary sinus tenderness.  Eyes:     General:        Right eye: No discharge.        Left eye: No discharge.     Conjunctiva/sclera: Conjunctivae normal.  Cardiovascular:     Rate and Rhythm: Normal rate and regular rhythm.     Heart sounds: Normal heart sounds. No murmur heard. Pulmonary:     Effort: Pulmonary effort is normal. No respiratory distress.     Breath sounds: Normal breath sounds. No wheezing or rales.  Chest:     Chest wall: No tenderness.  Lymphadenopathy:     Cervical: No cervical adenopathy.  Neurological:     Mental Status: She is alert and oriented to person, place, and time.    BP 126/60 (BP Location: Left Arm, Patient Position: Sitting, Cuff Size: Large)    Pulse 62    Temp (!) 97.5 F (36.4 C) (Oral)    Resp 18    Ht 5\' 2"  (1.575 m)    Wt 193 lb 6.4 oz (87.7 kg)    SpO2 95%    BMI 35.37 kg/m  Wt Readings from Last 3 Encounters:  08/08/21 193 lb 6.4 oz (87.7 kg)  05/16/21 185 lb (83.9 kg)  05/16/21 185 lb 3.2 oz (84  kg)     Health Maintenance Due  Topic Date Due   Zoster Vaccines- Shingrix (1 of 2) Never done   DEXA SCAN  Never done   TETANUS/TDAP  11/27/2019   OPHTHALMOLOGY EXAM  05/03/2020   FOOT EXAM  05/03/2021    There are no preventive care reminders to display for this patient.  Lab Results  Component Value Date   TSH 1.65 03/08/2021   Lab Results  Component Value Date   WBC 5.8 05/16/2021   HGB 11.9 (L) 05/16/2021   HCT 36.7 05/16/2021   MCV 89.6 05/16/2021   PLT 277.0 05/16/2021   Lab Results  Component Value Date   NA 136 05/16/2021   K 5.1 05/16/2021   CO2 26 05/16/2021   GLUCOSE 143 (H) 05/16/2021   BUN 24 (H) 05/16/2021   CREATININE 1.33 (H) 05/16/2021   BILITOT 0.4 05/16/2021   ALKPHOS 60 05/16/2021   AST 12 05/16/2021   ALT 9 05/16/2021   PROT 6.6 05/16/2021   ALBUMIN 4.2 05/16/2021   CALCIUM 9.6 05/16/2021   ANIONGAP 13 11/25/2018   GFR 39.63 (L) 05/16/2021   Lab Results  Component Value Date   CHOL 175 05/16/2021   Lab Results  Component Value Date   HDL 47.30 05/16/2021  Lab Results  Component Value Date   LDLCALC 107 (H) 05/16/2021   Lab Results  Component Value Date   TRIG 103.0 05/16/2021   Lab Results  Component Value Date   CHOLHDL 4 05/16/2021   Lab Results  Component Value Date   HGBA1C 7.3 (A) 04/16/2021      Assessment & Plan:   Problem List Items Addressed This Visit       Unprioritized   Bronchitis - Primary   Relevant Medications   azithromycin (ZITHROMAX Z-PAK) 250 MG tablet   benzonatate (TESSALON PERLES) 100 MG capsule   Hyperlipidemia LDL goal <70   Relevant Medications   fenofibrate 160 MG tablet   Other Visit Diagnoses     Family history of skin cancer       Relevant Orders   Ambulatory referral to Dermatology       Meds ordered this encounter  Medications   fenofibrate 160 MG tablet    Sig: Take 1 tablet (160 mg total) by mouth daily.    Dispense:  90 tablet    Refill:  1   azithromycin  (ZITHROMAX Z-PAK) 250 MG tablet    Sig: As directed    Dispense:  6 each    Refill:  0   benzonatate (TESSALON PERLES) 100 MG capsule    Sig: Take 1 capsule (100 mg total) by mouth 3 (three) times daily as needed for cough.    Dispense:  20 capsule    Refill:  0    Follow-up: Return if symptoms worsen or fail to improve.    Ann Held, DO

## 2021-08-08 NOTE — Patient Instructions (Signed)
Acute Bronchitis, Adult °Acute bronchitis is sudden inflammation of the main airways (bronchi) that come off the windpipe (trachea) in the lungs. The swelling causes the airways to get smaller and make more mucus than normal. This can make it hard to breathe and can cause coughing or noisy breathing (wheezing). °Acute bronchitis may last several weeks. The cough may last longer. Allergies, asthma, and exposure to smoke may make the condition worse. °What are the causes? °This condition can be caused by germs and by substances that irritate the lungs, including: °Cold and flu viruses. The most common cause of this condition is the virus that causes the common cold. °Bacteria. This is less common. °Breathing in substances that irritate the lungs, including: °Smoke from cigarettes and other forms of tobacco. °Dust and pollen. °Fumes from household cleaning products, gases, or burned fuel. °Indoor or outdoor air pollution. °What increases the risk? °The following factors may make you more likely to develop this condition: °A weak body's defense system, also called the immune system. °A condition that affects your lungs and breathing, such as asthma. °What are the signs or symptoms? °Common symptoms of this condition include: °Coughing. This may bring up clear, yellow, or green mucus from your lungs (sputum). °Wheezing. °Runny or stuffy nose. °Having too much mucus in your lungs (chest congestion). °Shortness of breath. °Aches and pains, including sore throat or chest. °How is this diagnosed? °This condition is usually diagnosed based on: °Your symptoms and medical history. °A physical exam. °You may also have other tests, including tests to rule out other conditions, such as pneumonia. These tests include: °A test of lung function. °Test of a mucus sample to look for the presence of bacteria. °Tests to check the oxygen level in your blood. °Blood tests. °Chest X-ray. °How is this treated? °Most cases of acute bronchitis  clear up over time without treatment. Your health care provider may recommend: °Drinking more fluids to help thin your mucus so it is easier to cough up. °Taking inhaled medicine (inhaler) to improve air flow in and out of your lungs. °Using a vaporizer or a humidifier. These are machines that add water to the air to help you breathe better. °Taking a medicine that thins mucus and clears congestion (expectorant). °Taking a medicine that prevents or stops coughing (cough suppressant). °It is notcommon to take an antibiotic medicine for this condition. °Follow these instructions at home: ° °Take over-the-counter and prescription medicines only as told by your health care provider. °Use an inhaler, vaporizer, or humidifier as told by your health care provider. °Take two teaspoons (10 mL) of honey at bedtime to lessen coughing at night. °Drink enough fluid to keep your urine pale yellow. °Do not use any products that contain nicotine or tobacco. These products include cigarettes, chewing tobacco, and vaping devices, such as e-cigarettes. If you need help quitting, ask your health care provider. °Get plenty of rest. °Return to your normal activities as told by your health care provider. Ask your health care provider what activities are safe for you. °Keep all follow-up visits. This is important. °How is this prevented? °To lower your risk of getting this condition again: °Wash your hands often with soap and water for at least 20 seconds. If soap and water are not available, use hand sanitizer. °Avoid contact with people who have cold symptoms. °Try not to touch your mouth, nose, or eyes with your hands. °Avoid breathing in smoke or chemical fumes. Breathing smoke or chemical fumes will make your condition   worse. °Get the flu shot every year. °Contact a health care provider if: °Your symptoms do not improve after 2 weeks. °You have trouble coughing up the mucus. °Your cough keeps you awake at night. °You have a  fever. °Get help right away if you: °Cough up blood. °Feel pain in your chest. °Have severe shortness of breath. °Faint or keep feeling like you are going to faint. °Have a severe headache. °Have a fever or chills that get worse. °These symptoms may represent a serious problem that is an emergency. Do not wait to see if the symptoms will go away. Get medical help right away. Call your local emergency services (911 in the [place] Do not drive yourself to the hospital. °Summary °Acute bronchitis is inflammation of the main airways (bronchi) that come off the windpipe (trachea) in the lungs. The swelling causes the airways to get smaller and make more mucus than normal. °Drinking more fluids can help thin your mucus so it is easier to cough up. °Take over-the-counter and prescription medicines only as told by your health care provider. °Do not use any products that contain nicotine or tobacco. These products include cigarettes, chewing tobacco, and vaping devices, such as e-cigarettes. If you need help quitting, ask your health care provider. °Contact a health care provider if your symptoms do not improve after 2 weeks. °This information is not intended to replace advice given to you by your health care provider. Make sure you discuss any questions you have with your health care provider. °Document Revised: 10/31/2020 Document Reviewed: 10/31/2020 °Elsevier Patient Education © 2022 [employer] ° °

## 2021-08-13 ENCOUNTER — Ambulatory Visit: Payer: Medicare Other | Admitting: Internal Medicine

## 2021-08-29 DIAGNOSIS — Z96652 Presence of left artificial knee joint: Secondary | ICD-10-CM | POA: Diagnosis not present

## 2021-08-29 DIAGNOSIS — M25561 Pain in right knee: Secondary | ICD-10-CM | POA: Diagnosis not present

## 2021-08-29 DIAGNOSIS — Z96651 Presence of right artificial knee joint: Secondary | ICD-10-CM | POA: Diagnosis not present

## 2021-08-29 DIAGNOSIS — M25562 Pain in left knee: Secondary | ICD-10-CM | POA: Diagnosis not present

## 2021-08-30 ENCOUNTER — Other Ambulatory Visit: Payer: Self-pay | Admitting: Family Medicine

## 2021-08-30 ENCOUNTER — Encounter: Payer: Self-pay | Admitting: Family Medicine

## 2021-08-30 DIAGNOSIS — J40 Bronchitis, not specified as acute or chronic: Secondary | ICD-10-CM

## 2021-08-30 MED ORDER — AMOXICILLIN-POT CLAVULANATE 875-125 MG PO TABS: 1.0000 | ORAL_TABLET | Freq: Two times a day (BID) | ORAL | 0 refills | Status: DC

## 2021-08-30 NOTE — Telephone Encounter (Signed)
Pt seen on 08/08/21 for this concern. Please advise

## 2021-09-03 ENCOUNTER — Encounter: Payer: Self-pay | Admitting: Internal Medicine

## 2021-09-03 ENCOUNTER — Ambulatory Visit: Payer: Medicare Other | Admitting: Internal Medicine

## 2021-09-03 ENCOUNTER — Ambulatory Visit (INDEPENDENT_AMBULATORY_CARE_PROVIDER_SITE_OTHER): Payer: Medicare Other | Admitting: Internal Medicine

## 2021-09-03 ENCOUNTER — Other Ambulatory Visit: Payer: Self-pay

## 2021-09-03 VITALS — BP 118/62 | HR 58 | Ht 62.0 in | Wt 196.8 lb

## 2021-09-03 DIAGNOSIS — E785 Hyperlipidemia, unspecified: Secondary | ICD-10-CM | POA: Diagnosis not present

## 2021-09-03 DIAGNOSIS — E1122 Type 2 diabetes mellitus with diabetic chronic kidney disease: Secondary | ICD-10-CM

## 2021-09-03 DIAGNOSIS — N1832 Chronic kidney disease, stage 3b: Secondary | ICD-10-CM | POA: Diagnosis not present

## 2021-09-03 DIAGNOSIS — E669 Obesity, unspecified: Secondary | ICD-10-CM | POA: Diagnosis not present

## 2021-09-03 DIAGNOSIS — Z794 Long term (current) use of insulin: Secondary | ICD-10-CM | POA: Diagnosis not present

## 2021-09-03 LAB — POCT GLYCOSYLATED HEMOGLOBIN (HGB A1C): Hemoglobin A1C: 6.3 % — AB (ref 4.0–5.6)

## 2021-09-03 NOTE — Addendum Note (Signed)
Addended by: Lauralyn Primes on: 09/03/2021 11:01 AM   Modules accepted: Orders

## 2021-09-03 NOTE — Progress Notes (Signed)
Patient ID: Bethany Miller, female   DOB: 1946/06/21, 76 y.o.   MRN: [id number]  This visit occurred during the SARS-CoV-2 public health emergency.  Safety protocols were in place, including screening questions prior to the visit, additional usage of staff PPE, and extensive cleaning of exam room while observing appropriate contact time as indicated for disinfecting solutions.   HPI: Bethany Miller is a 76 y.o.-year-old female, presenting for f/u for DM2, dx 2010, insulin-dependent, uncontrolled, with complications (CKD).  Last visit 4 months ago. She has [employer] part D for meds, [employer], and [employer] for the rest.   Interim history: No increased urination, blurry vision, nausea, chest pain. She has congestion for few weeks. She did gain weight since last visit, after starting insulin [place].  Reviewed HbA1c levels: Lab Results  Component Value Date   HGBA1C 7.3 (A) 04/16/2021   HGBA1C 7.0 (A) 12/07/2020   HGBA1C 8.1 (A) 08/31/2020   HGBA1C 7.4 (A) 02/28/2020   HGBA1C 9.2 (H) 10/06/2019   HGBA1C 8.6 (H) 04/01/2019   HGBA1C 10.4 (H) 11/30/2018   HGBA1C 9.5 (A) 08/26/2018   HGBA1C 9.8 (A) 03/31/2018   HGBA1C 8.4 11/26/2017   HGBA1C 7.0 07/30/2017   HGBA1C 7.6 (H) 04/21/2017   HGBA1C 7.4 (H) 03/31/2017   HGBA1C 8.5 (H) 02/17/2017   HGBA1C 8.6 (H) 11/11/2016   HGBA1C 7.9% 07/03/2016   HGBA1C 8.8 (H) 03/28/2016   HGBA1C 8.1 (H) 11/16/2015   HGBA1C 7.3 (H) 12/23/2013   HGBA1C 7.2 (H) 09/26/2013     Previously on: - Metformin ER 1000 mg 2x a day (2000 mg with dinner did not change the sugars) >> 1000 mg with breakfast - Glipizide 5 >> 10 mg 2x a day before breakfast and dinner - >> stopped 02/2020 by Dr. Kelton Pillar We tried Ozempic but she developed nausea and vomiting and ended up in the emergency room in 11/2018. We again tried [place]  - added again 04/2018 >> not affordable Stopped  Glipizide 5 mg 2x a day 06/2017 b/c lows, but  restarted afterwards. Tradjenta and Alogliptin were not covered She was previously on Invokana, Jardiance 10 mg daily >> too expensive. Tried Cycloset >> too expensive Tried Januvia >>  too expensive.  Tried Metformin 500 mg po >> severe diarrhea. She was on Glimepiride >> hypoglycemia in the 60's on 2 mg daily. She had rapid acting insulin with steroid shots before.   At last visit, I advised her to take: - Metformin ER 500 mg 2x a day >> 1000 mg with dinner >> restarted >> 500 mg 2x a day >> 500 mg in am - >> not using - Trulicity 7.82 mg weekly >> started ~09/2020 >> 1.5 mg weekly - Lantus 10-12 units in HS - added back 04/2021 Stopped Lantus ~09/2020.  Pt checks her sugars twice a day: - am: 141, 200s >> 140-202 >> 112-170 >> 130-269 >> 98-120 - 2h after b'fast: 97, 164, 173 >> n/c - Lunch:  low 100s >> n/c >> 163 >> n/c - 2h after lunch: 160 >> n/c - Dinnertime:  72 (06/2020) >> n/c >> 122, 129 >> 90-125 - 2h after dinner: 128-250 >> 99, 200 >> 97-130 >> n/c Lowest sugar was 56 x1 >> ...122 >> 90;  she has hypoglycemia awareness in the 90s >> 70s. Highest sugar was 240 (steak and cheese) >> 269 >> 180 (sick).  ReliOn meter.  -+ CKD, last BUN/creatinine:  Lab Results  Component  Value Date   BUN 24 (H) 05/16/2021   CREATININE 1.33 (H) 05/16/2021   No MAU: 06/03/2021 (CCK): <4 Lab Results  Component Value Date   MICRALBCREAT 4 03/28/2016   MICRALBCREAT 0.6 11/16/2015   MICRALBCREAT 0.6 01/26/2013   MICRALBCREAT 0.3 03/24/2012   MICRALBCREAT 0.4 03/18/2011   MICRALBCREAT 11.4 10/29/2009   MICRALBCREAT 3.4 04/09/2009   MICRALBCREAT 17.1 01/25/2009   GFR: Lab Results  Component Value Date   GFRNONAA 29 (L) 11/25/2018   GFRNONAA 45 (L) 04/21/2017   GFRNONAA 54 (L) 02/27/2017   GFRNONAA 35 (L) 02/17/2017   GFRNONAA 52.79 (L) 06/19/2010   GFRNONAA 59.68 10/29/2009   GFRNONAA 53.52 07/03/2009   GFRNONAA 59.78 04/09/2009   GFRNONAA 59.82 01/25/2009   GFRNONAA 78  05/08/2008  On Lisinopril 10.  -+ HL; last set of lipids: Lab Results  Component Value Date   CHOL 175 05/16/2021   HDL 47.30 05/16/2021   LDLCALC 107 (H) 05/16/2021   LDLDIRECT 149.6 01/26/2013   TRIG 103.0 05/16/2021   CHOLHDL 4 05/16/2021  On Lipitor 40, Fenofibrate 160.  - last eye exam was in 04/2019: No DR  -denies numbness and tingling in her feet.  She sees podiatry for ingrown toenails.  She also has a history of HTN. 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  ROS: + See HPI  I reviewed pt's medications, allergies, PMH, social hx, family hx, and changes were documented in the history of present illness. Otherwise, unchanged from my initial visit note.  Past Medical History:  Diagnosis Date   Diabetes mellitus    Hyperlipidemia    Hypertension    Murmur    Past Surgical History:  Procedure Laterality Date   ABDOMINAL HYSTERECTOMY     BACK SURGERY     CHOLECYSTECTOMY     TONSILLECTOMY     TOTAL KNEE ARTHROPLASTY     TOTAL KNEE ARTHROPLASTY Right 02/26/2017   Procedure: RIGHT TOTAL KNEE ARTHROPLASTY;  Surgeon: Susa Day, MD;  Location: [facility];  Service: Orthopedics;  Laterality: Right;   Social History   Socioeconomic History   Marital status: Widowed    Spouse name: Not on file   Number of children: Not on file   Years of education: Not on file   Highest education level: Not on file  Occupational History   Not on file  Tobacco Use   Smoking status: Never   Smokeless tobacco: Never  Substance and Sexual Activity   Alcohol use: Yes    Comment: rare   Drug use: No   Sexual activity: Not Currently    Partners: Male  Other Topics Concern   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   Caffeine use: coffee in the am   Social Determinants of Health   Financial Resource Strain: Low Risk    Difficulty of Paying Living Expenses: Not hard at all  Food Insecurity: No Food Insecurity   Worried About Charity fundraiser in the  Last Year: Never true   [place] in the Last Year: Never true  Transportation Needs: No Transportation Needs   Lack of Transportation (Medical): No   Lack of Transportation (Non-Medical): No  Physical Activity: Inactive   Days of Exercise per Week: 0 days   Minutes of Exercise per Session: 0 min  Stress: No Stress Concern Present   Feeling of Stress : Only a little  Social Connections: Moderately Isolated   Frequency of Communication  with Friends and Family: More than three times a week   Frequency of Social Gatherings with Friends and Family: More than three times a week   Attends Religious Services: Never   Marine scientist or Organizations: Yes   Attends Music therapist: More than 4 times per year   Marital Status: Widowed  Human resources officer Violence: Not At Risk   Fear of Current or Ex-Partner: No   Emotionally Abused: No   Physically Abused: No   Sexually Abused: No   Current Outpatient Medications on File Prior to Visit  Medication Sig Dispense Refill   acetaminophen (TYLENOL) 650 MG CR tablet Take 650 mg by mouth every 8 (eight) hours as needed for pain.     amoxicillin (AMOXIL) 500 MG capsule 4 po 1 hr prior to procedure 4 capsule 2   amoxicillin-clavulanate (AUGMENTIN) 875-125 MG tablet Take 1 tablet by mouth 2 (two) times daily. 20 tablet 0   Ascorbic Acid (VITAMIN C) 100 MG tablet Take 100 mg by mouth daily.     aspirin EC 81 MG tablet Take 81 mg by mouth daily.     atorvastatin (LIPITOR) 40 MG tablet TAKE 1 TABLET DAILY 90 tablet 1   azithromycin (ZITHROMAX Z-PAK) 250 MG tablet As directed 6 each 0   benzonatate (TESSALON PERLES) 100 MG capsule Take 1 capsule (100 mg total) by mouth 3 (three) times daily as needed for cough. 20 capsule 0   cholecalciferol (VITAMIN D3) 25 MCG (1000 UNIT) tablet Take 2,000 Units by mouth daily.     citalopram (CELEXA) 40 MG tablet TAKE 1 TABLET EVERY DAY 90 tablet 3   docusate sodium (COLACE) 100 MG capsule Take  1 capsule (100 mg total) by mouth 2 (two) times daily as needed for mild constipation. 30 capsule 1   Dulaglutide (TRULICITY) 1.5 FG/1.8EX SOPN Inject 1.5 mg into the skin once a week. 6 mL 3   fenofibrate 160 MG tablet Take 1 tablet (160 mg total) by mouth daily. 90 tablet 1   fluticasone (FLONASE) 50 MCG/ACT nasal spray Place 2 sprays into both nostrils daily. 16 g 6   glipiZIDE (GLUCOTROL) 5 MG tablet Take 1 tablet (5 mg total) by mouth daily before supper. 90 tablet 3   glucose blood (ACCU-CHEK GUIDE) test strip USE 1 STRIP TO CHECK GLUCOSE ONCE DAILY 100 each 4   Insulin Admin Supplies MISC Use to inject insulin 2 times a day. 180 each 0   insulin glargine (LANTUS SOLOSTAR) 100 UNIT/ML Solostar Pen Inject 12 Units into the skin daily. 15 mL 11   Insulin Pen Needle 32G X 4 MM MISC Use 1x a day 100 each 3   Lancets (ACCU-CHEK MULTICLIX) lancets Use as instructed to test once a day DX E11.51 100 each 1   lisinopril-hydrochlorothiazide (ZESTORETIC) 10-12.5 MG tablet TAKE 1 TABLET DAILY 90 tablet 1   Loratadine-Pseudoephedrine (CLARITIN-D 12 HOUR PO) Take 1 tablet by mouth daily as needed (sinus).      meloxicam (MOBIC) 7.5 MG tablet TAKE 1 TABLET DAILY 90 tablet 1   metFORMIN (GLUCOPHAGE-XR) 500 MG 24 hr tablet Take 1 tablet (500 mg total) by mouth 2 (two) times daily. 180 tablet 3   Multiple Vitamins-Minerals (MULTIVITAMIN ADULT PO) Take 1 tablet by mouth daily.      omeprazole (PRILOSEC) 20 MG capsule Take 1 capsule (20 mg total) by mouth daily. 90 capsule 3   traZODone (DESYREL) 50 MG tablet TAKE 1/2 TO 1 (ONE-HALF TO ONE) TABLET  BY MOUTH AT BEDTIME AS NEEDED FOR SLEEP 90 tablet 1   vitamin B-12 (CYANOCOBALAMIN) 100 MCG tablet Take 100 mcg by mouth daily.     zinc gluconate 50 MG tablet Take 50 mg by mouth daily.     No current facility-administered medications on file prior to visit.   Allergies  Allergen Reactions   Ozempic (0.25 Or 0.5 Mg-Dose) [Semaglutide(0.25 Or 0.5mg -Dos)] Diarrhea  and Nausea And Vomiting    Pt stated, "I got dehydrated from this as well"   Family History  Problem Relation Age of Onset   Throat cancer Brother    Heart disease Brother        MI   Cancer Brother 31       throat,    Hyperlipidemia Brother    Cirrhosis Father    Alcohol abuse Father    Heart disease Brother    Cancer Brother    Arthritis Mother    Hypertension Sister    Arthritis Sister    Alzheimer's disease Sister    Cancer Brother    Cancer Brother 42       melanoma   Hypertension Brother    Hyperlipidemia Brother    Melanoma Other    Arthritis Other    PE: BP 118/62 (BP Location: Right Arm, Patient Position: Sitting, Cuff Size: Normal)    Pulse (!) 58    Ht 5\' 2"  (1.575 m)    Wt 196 lb 12.8 oz (89.3 kg)    SpO2 98%    BMI 36.00 kg/m  Wt Readings from Last 3 Encounters:  09/03/21 196 lb 12.8 oz (89.3 kg)  08/08/21 193 lb 6.4 oz (87.7 kg)  05/16/21 185 lb (83.9 kg)   Constitutional: overweight, in NAD Eyes: PERRLA, EOMI, no exophthalmos ENT: moist mucous membranes, no thyromegaly, no cervical lymphadenopathy Cardiovascular: RRR, No MRG Respiratory: CTA B Musculoskeletal: no deformities, strength intact in all 4 Skin: moist, warm, no rashes Neurological: no tremor with outstretched hands, DTR normal in all 4 Diabetic Foot Exam - Simple   Simple Foot Form Diabetic Foot exam was performed with the following findings: Yes 09/03/2021 10:20 AM  Visual Inspection No deformities, no ulcerations, no other skin breakdown bilaterally: Yes Sensation Testing Intact to touch and monofilament testing bilaterally: Yes Pulse Check Posterior Tibialis and Dorsalis pulse intact bilaterally: Yes Comments    ASSESSMENT: 1. DM2, insulin-dependent, uncontrolled, with long-term complications: - CKD  2. HL   3. Obesity class II  PLAN:  1. Patient with history of uncontrolled diabetes, on oral antidiabetic regimen with metformin and sulfonylurea before a larger meal, and also  on weekly GLP-1 receptor agonist and basal insulin added back at last visit.  Of note, she was on insulin before but she stopped it when added Trulicity as she did not want to gain weight. -She previously could not tolerate Ozempic due to GI side effects but she does tolerate Trulicity well.  -At last visit, HbA1c was 7.3%, increased from 7.0% and sugars in the morning were higher, with quite a few spikes in the 200s.  She was not checking consistently later in the day and I advised her to do so.  We discussed about splitting metformin in 2 doses since she did have runny stools when she was taking the whole dose before dinner.  I also recommended to restart a low-dose basal insulin and increase as needed. -At this visit, she continues on the same low-dose of Lantus, usually 10 units, occasionally increasing to 12 units  daily, which made a big difference in her blood sugars.  They are now almost all at goal, and she is really happy with the regimen.  She did not have to take glipizide before dinner since last visit.  For now, I advised her to continue the current regimen including Trulicity at the same dose. - I suggested to:  Patient Instructions  Please continue: - Metformin ER 500 mg 1x a day - Trulicity 1.5 mg weekly - Lantus 10-12 units at bedtime   Please return in 4 months with your sugar log.   - we checked her HbA1c: 6.3% (lower)  - advised to check sugars at different times of the day - 1x a day, rotating check times - advised for yearly eye exams >> she is not UTD -she has an appointment scheduled - foot exam performed today - return to clinic in 4 months   2. HL -Reviewed latest lipid panel from 05/2021: LDL still above target, the rest of the fractions at goal: Lab Results  Component Value Date   CHOL 175 05/16/2021   HDL 47.30 05/16/2021   LDLCALC 107 (H) 05/16/2021   LDLDIRECT 149.6 01/26/2013   TRIG 103.0 05/16/2021   CHOLHDL 4 05/16/2021  -She continues on Lipitor 40 mg  daily and fenofibrate 160 mg daily without side effects  3.  Obesity class II -She could not tolerate Ozempic due to nausea and vomiting -We started Trulicity and she tolerates this well.  This should also help with weight loss. -She lost 10 pounds before the last 2 visits combined, but gained 11 pounds since last visit after starting insulin.  We discussed that unfortunately, insulin is conducive to weight gain  Philemon Kingdom, MD PhD [facility] Endocrinology

## 2021-09-03 NOTE — Patient Instructions (Signed)
Please continue: - Metformin ER 500 mg 1x a day - Trulicity 1.5 mg weekly - Lantus 10-12 units at bedtime   Please return in 4 months with your sugar log.

## 2021-09-17 DIAGNOSIS — E119 Type 2 diabetes mellitus without complications: Secondary | ICD-10-CM | POA: Diagnosis not present

## 2021-09-17 DIAGNOSIS — Z7984 Long term (current) use of oral hypoglycemic drugs: Secondary | ICD-10-CM | POA: Diagnosis not present

## 2021-09-17 DIAGNOSIS — H25813 Combined forms of age-related cataract, bilateral: Secondary | ICD-10-CM | POA: Diagnosis not present

## 2021-10-10 ENCOUNTER — Encounter: Payer: Self-pay | Admitting: Internal Medicine

## 2021-10-18 ENCOUNTER — Other Ambulatory Visit: Payer: Self-pay | Admitting: Family Medicine

## 2021-10-18 DIAGNOSIS — I10 Essential (primary) hypertension: Secondary | ICD-10-CM

## 2021-10-18 DIAGNOSIS — E785 Hyperlipidemia, unspecified: Secondary | ICD-10-CM

## 2021-10-18 DIAGNOSIS — M5441 Lumbago with sciatica, right side: Secondary | ICD-10-CM

## 2021-10-23 ENCOUNTER — Other Ambulatory Visit: Payer: Self-pay | Admitting: Family Medicine

## 2021-10-23 DIAGNOSIS — G4700 Insomnia, unspecified: Secondary | ICD-10-CM

## 2021-11-12 ENCOUNTER — Encounter: Payer: Self-pay | Admitting: Internal Medicine

## 2021-11-12 DIAGNOSIS — Z794 Long term (current) use of insulin: Secondary | ICD-10-CM

## 2021-11-13 ENCOUNTER — Other Ambulatory Visit: Payer: Self-pay | Admitting: Internal Medicine

## 2021-11-13 DIAGNOSIS — Z794 Long term (current) use of insulin: Secondary | ICD-10-CM

## 2021-11-13 MED ORDER — TIRZEPATIDE 5 MG/0.5ML ~~LOC~~ SOAJ: 5.0000 mg | SUBCUTANEOUS | 2 refills | Status: DC

## 2021-11-14 ENCOUNTER — Ambulatory Visit (INDEPENDENT_AMBULATORY_CARE_PROVIDER_SITE_OTHER): Payer: Medicare Other | Admitting: Family Medicine

## 2021-11-14 ENCOUNTER — Encounter: Payer: Self-pay | Admitting: Family Medicine

## 2021-11-14 ENCOUNTER — Other Ambulatory Visit (HOSPITAL_COMMUNITY): Payer: Self-pay

## 2021-11-14 VITALS — BP 118/68 | HR 64 | Temp 97.3°F | Resp 18 | Ht 62.0 in | Wt 196.8 lb

## 2021-11-14 DIAGNOSIS — N1832 Chronic kidney disease, stage 3b: Secondary | ICD-10-CM | POA: Diagnosis not present

## 2021-11-14 DIAGNOSIS — E785 Hyperlipidemia, unspecified: Secondary | ICD-10-CM | POA: Diagnosis not present

## 2021-11-14 DIAGNOSIS — E559 Vitamin D deficiency, unspecified: Secondary | ICD-10-CM

## 2021-11-14 DIAGNOSIS — E538 Deficiency of other specified B group vitamins: Secondary | ICD-10-CM | POA: Diagnosis not present

## 2021-11-14 DIAGNOSIS — R5383 Other fatigue: Secondary | ICD-10-CM

## 2021-11-14 DIAGNOSIS — E1122 Type 2 diabetes mellitus with diabetic chronic kidney disease: Secondary | ICD-10-CM

## 2021-11-14 DIAGNOSIS — I10 Essential (primary) hypertension: Secondary | ICD-10-CM | POA: Diagnosis not present

## 2021-11-14 DIAGNOSIS — E1169 Type 2 diabetes mellitus with other specified complication: Secondary | ICD-10-CM

## 2021-11-14 LAB — TSH: TSH: 3.01 u[IU]/mL (ref 0.35–5.50)

## 2021-11-14 LAB — COMPREHENSIVE METABOLIC PANEL
ALT: 12 U/L (ref 0–35)
AST: 14 U/L (ref 0–37)
Albumin: 3.9 g/dL (ref 3.5–5.2)
Alkaline Phosphatase: 55 U/L (ref 39–117)
BUN: 32 mg/dL — ABNORMAL HIGH (ref 6–23)
CO2: 26 mEq/L (ref 19–32)
Calcium: 9 mg/dL (ref 8.4–10.5)
Chloride: 104 mEq/L (ref 96–112)
Creatinine, Ser: 1.8 mg/dL — ABNORMAL HIGH (ref 0.40–1.20)
GFR: 27.47 mL/min — ABNORMAL LOW (ref >60.00)
Glucose, Bld: 136 mg/dL — ABNORMAL HIGH (ref 70–99)
Potassium: 4.9 mEq/L (ref 3.5–5.1)
Sodium: 137 mEq/L (ref 135–145)
Total Bilirubin: 0.5 mg/dL (ref 0.2–1.2)
Total Protein: 6.5 g/dL (ref 6.0–8.3)

## 2021-11-14 LAB — CBC WITH DIFFERENTIAL/PLATELET
Basophils Absolute: 0 10*3/uL (ref 0.0–0.1)
Basophils Relative: 0.7 % (ref 0.0–3.0)
Eosinophils Absolute: 0.2 10*3/uL (ref 0.0–0.7)
Eosinophils Relative: 2.1 % (ref 0.0–5.0)
HCT: 35 % — ABNORMAL LOW (ref 36.0–46.0)
Hemoglobin: 11.6 g/dL — ABNORMAL LOW (ref 12.0–15.0)
Lymphocytes Relative: 19.5 % (ref 12.0–46.0)
Lymphs Abs: 1.4 10*3/uL (ref 0.7–4.0)
MCHC: 33 g/dL (ref 30.0–36.0)
MCV: 88 fl (ref 78.0–100.0)
Monocytes Absolute: 0.4 10*3/uL (ref 0.1–1.0)
Monocytes Relative: 5.2 % (ref 3.0–12.0)
Neutro Abs: 5.3 10*3/uL (ref 1.4–7.7)
Neutrophils Relative %: 72.5 % (ref 43.0–77.0)
Platelets: 304 10*3/uL (ref 150.0–400.0)
RBC: 3.98 Mil/uL (ref 3.87–5.11)
RDW: 14.4 % (ref 11.5–15.5)
WBC: 7.3 10*3/uL (ref 4.0–10.5)

## 2021-11-14 LAB — LIPID PANEL
Cholesterol: 141 mg/dL (ref 0–200)
HDL: 40.8 mg/dL
LDL Cholesterol: 79 mg/dL (ref 0–99)
NonHDL: 100.26
Total CHOL/HDL Ratio: 3
Triglycerides: 106 mg/dL (ref 0.0–149.0)
VLDL: 21.2 mg/dL (ref 0.0–40.0)

## 2021-11-14 LAB — VITAMIN B12: Vitamin B-12: 562 pg/mL (ref 211–911)

## 2021-11-14 LAB — VITAMIN D 25 HYDROXY (VIT D DEFICIENCY, FRACTURES): VITD: 43.42 ng/mL (ref 30.00–100.00)
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
## 2021-11-14 NOTE — Patient Instructions (Signed)

## 2021-11-14 NOTE — Assessment & Plan Note (Signed)
Encourage heart healthy diet such as MIND or DASH diet, increase exercise, avoid trans fats, simple carbohydrates and processed foods, consider a krill or fish or flaxseed oil cap daily.  °

## 2021-11-14 NOTE — Assessment & Plan Note (Signed)
Well controlled, no changes to meds. Encouraged heart healthy diet such as the DASH diet and exercise as tolerated.  °

## 2021-11-14 NOTE — Assessment & Plan Note (Signed)
Per endo °

## 2021-11-15 ENCOUNTER — Telehealth: Payer: Self-pay | Admitting: Pharmacy Technician

## 2021-11-15 ENCOUNTER — Other Ambulatory Visit (HOSPITAL_COMMUNITY): Payer: Self-pay

## 2021-11-15 NOTE — Telephone Encounter (Signed)
Patient Advocate Encounter ?  ?Received notification that prior authorization for [facility] 5MG/0.5ML pen-injectors is required. ?  ?PA submitted on 11/15/2021 ?Key [id number] ?Status is pending ?   ? ?Patient Advocate Encounter ? ?Prior Authorization for  [facility] 5MG/0.5ML pen-injectors has been approved.   ? ?PA# [id number] ?Effective dates: 10/16/2021 through 11/15/2022 ? ?Patients co-pay is $669.16.  ?Plan shows that patient is on high deductible plan but does not show remaining balance until met. ? ? ?Lady Deutscher, CPhT-Adv ?Pharmacy Patient Advocate Specialist ?[place] Patient Advocate Team ?Direct Number: [phone]  Fax: [phone] ? ? ?Lady Deutscher, CPhT-Adv ?Pharmacy Patient Advocate Specialist ?[place] Patient Advocate Team ?Direct Number: [phone]  Fax: [phone] ? ?

## 2021-11-21 DIAGNOSIS — Z808 Family history of malignant neoplasm of other organs or systems: Secondary | ICD-10-CM | POA: Diagnosis not present

## 2021-11-21 DIAGNOSIS — D044 Carcinoma in situ of skin of scalp and neck: Secondary | ICD-10-CM | POA: Diagnosis not present

## 2021-11-21 DIAGNOSIS — L820 Inflamed seborrheic keratosis: Secondary | ICD-10-CM | POA: Diagnosis not present

## 2021-11-21 DIAGNOSIS — Z129 Encounter for screening for malignant neoplasm, site unspecified: Secondary | ICD-10-CM | POA: Diagnosis not present

## 2021-11-21 DIAGNOSIS — D485 Neoplasm of uncertain behavior of skin: Secondary | ICD-10-CM | POA: Diagnosis not present

## 2021-11-21 DIAGNOSIS — L821 Other seborrheic keratosis: Secondary | ICD-10-CM | POA: Diagnosis not present

## 2021-12-03 DIAGNOSIS — E785 Hyperlipidemia, unspecified: Secondary | ICD-10-CM | POA: Diagnosis not present

## 2021-12-03 DIAGNOSIS — F419 Anxiety disorder, unspecified: Secondary | ICD-10-CM | POA: Diagnosis not present

## 2021-12-03 DIAGNOSIS — E1122 Type 2 diabetes mellitus with diabetic chronic kidney disease: Secondary | ICD-10-CM | POA: Diagnosis not present

## 2021-12-03 DIAGNOSIS — I129 Hypertensive chronic kidney disease with stage 1 through stage 4 chronic kidney disease, or unspecified chronic kidney disease: Secondary | ICD-10-CM | POA: Diagnosis not present

## 2021-12-03 DIAGNOSIS — F32A Depression, unspecified: Secondary | ICD-10-CM | POA: Diagnosis not present

## 2021-12-03 DIAGNOSIS — N1832 Chronic kidney disease, stage 3b: Secondary | ICD-10-CM | POA: Diagnosis not present

## 2021-12-06 ENCOUNTER — Encounter: Payer: Self-pay | Admitting: Family Medicine

## 2021-12-06 DIAGNOSIS — D044 Carcinoma in situ of skin of scalp and neck: Secondary | ICD-10-CM | POA: Insufficient documentation

## 2021-12-16 ENCOUNTER — Encounter: Payer: Self-pay | Admitting: Internal Medicine

## 2021-12-16 ENCOUNTER — Telehealth: Payer: Self-pay

## 2021-12-16 NOTE — Telephone Encounter (Signed)
OK 

## 2021-12-16 NOTE — Telephone Encounter (Signed)
Pt called to request upcoming appt be switched to virtual.

## 2021-12-16 NOTE — Telephone Encounter (Signed)
Contacted pt to advised her scheduled appt could be switched to virtual and pt advised she did not want her appt switched she was requesting a sooner appt and she was hoping it could be virtual. Advised pt provider's scheduled is booked and her appt scheduled 01/21/22 would be the soonest she could be seen. Pt states she was having low blood sugars and had some questions regarding her medication and when asked what questions pt refused to answer and hung up the phone.  I attempted to call pt back for further information and pt did not answer the phone. I left a detailed message letting pt know her original follow up appt is a in-person appt. I let pt know she can send any questions she may have regarding medications directly via MyChart.

## 2022-01-21 ENCOUNTER — Ambulatory Visit (INDEPENDENT_AMBULATORY_CARE_PROVIDER_SITE_OTHER): Payer: Medicare Other | Admitting: Internal Medicine

## 2022-01-21 ENCOUNTER — Telehealth: Payer: Self-pay | Admitting: Internal Medicine

## 2022-01-21 ENCOUNTER — Encounter: Payer: Self-pay | Admitting: Internal Medicine

## 2022-01-21 VITALS — BP 140/90 | HR 63 | Ht 62.0 in | Wt 204.2 lb

## 2022-01-21 DIAGNOSIS — N1832 Chronic kidney disease, stage 3b: Secondary | ICD-10-CM | POA: Diagnosis not present

## 2022-01-21 DIAGNOSIS — E785 Hyperlipidemia, unspecified: Secondary | ICD-10-CM

## 2022-01-21 DIAGNOSIS — Z794 Long term (current) use of insulin: Secondary | ICD-10-CM

## 2022-01-21 DIAGNOSIS — E1122 Type 2 diabetes mellitus with diabetic chronic kidney disease: Secondary | ICD-10-CM

## 2022-01-21 DIAGNOSIS — E669 Obesity, unspecified: Secondary | ICD-10-CM

## 2022-01-21 LAB — POCT GLYCOSYLATED HEMOGLOBIN (HGB A1C): Hemoglobin A1C: 6.9 % — AB (ref 4.0–5.6)

## 2022-01-21 MED ORDER — METFORMIN HCL ER 500 MG PO TB24: 500.0000 mg | ORAL_TABLET | Freq: Two times a day (BID) | ORAL | 3 refills | Status: DC

## 2022-01-21 MED ORDER — LANTUS SOLOSTAR 100 UNIT/ML ~~LOC~~ SOPN: 15.0000 [IU] | PEN_INJECTOR | Freq: Every day | SUBCUTANEOUS | 3 refills | Status: DC

## 2022-01-21 MED ORDER — EMPAGLIFLOZIN 25 MG PO TABS: 25.0000 mg | ORAL_TABLET | Freq: Every day | ORAL | 3 refills | Status: DC

## 2022-01-21 NOTE — Telephone Encounter (Signed)
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

## 2022-01-21 NOTE — Patient Instructions (Addendum)
Please continue: - Metformin 500 mg in am  Increase:  - Lantus 15 units daily  Add: - Jardiance 12.5 mg daily before b'fast  Please return in  3 months with your sugar log.

## 2022-01-21 NOTE — Progress Notes (Signed)
Patient ID: Bethany Miller, female   DOB: [birth year], 76 y.o.   MRN: [id number]  HPI: Bethany Miller is a 76 y.o.-year-old female, presenting for f/u for DM2, dx 2010, insulin-dependent, uncontrolled, with complications (CKD).  Last visit 4.5 months ago. She has [employer] part D for meds, [employer], and [employer] for the rest.   Interim history: No increased urination, blurry vision, nausea, chest pain. Last month she had to stop Trulicity because she did not receive it from the patient assistance program anymore.  [facility] is not supplying this anymore.  She had 1 low blood sugar after she used her neighbors higher Trulicity dose.  She only used this once.  No lows since then.  In fact, sugars have been higher. She gained 8 lbs since last OV.  She feels she is retaining fluid.  Reviewed HbA1c levels: Lab Results  Component Value Date   HGBA1C 6.3 (A) 09/03/2021   HGBA1C 7.3 (A) 04/16/2021   HGBA1C 7.0 (A) 12/07/2020   HGBA1C 8.1 (A) 08/31/2020   HGBA1C 7.4 (A) 02/28/2020   HGBA1C 9.2 (H) 10/06/2019   HGBA1C 8.6 (H) 04/01/2019   HGBA1C 10.4 (H) 11/30/2018   HGBA1C 9.5 (A) 08/26/2018   HGBA1C 9.8 (A) 03/31/2018   HGBA1C 8.4 11/26/2017   HGBA1C 7.0 07/30/2017   HGBA1C 7.6 (H) 04/21/2017   HGBA1C 7.4 (H) 03/31/2017   HGBA1C 8.5 (H) 02/17/2017   HGBA1C 8.6 (H) 11/11/2016   HGBA1C 7.9% 07/03/2016   HGBA1C 8.8 (H) 03/28/2016   HGBA1C 8.1 (H) 11/16/2015   HGBA1C 7.3 (H) 12/23/2013     Previously on: - Metformin ER 1000 mg 2x a day (2000 mg with dinner did not change the sugars) >> 1000 mg with breakfast - Glipizide 5 >> 10 mg 2x a day before breakfast and dinner - >> stopped 02/2020 by Dr. Kelton Pillar We tried Ozempic but she developed nausea and vomiting and ended up in the emergency room in 11/2018. We again tried [place]  - added again 04/2018 >> not affordable Stopped  Glipizide 5 mg 2x a day 06/2017 b/c lows, but restarted afterwards. Tradjenta and Alogliptin were not  covered She was previously on Invokana, Jardiance 10 mg daily >> too expensive. Tried Cycloset >> too expensive Tried Januvia >>  too expensive.  Tried Metformin 500 mg po >> severe diarrhea. She was on Glimepiride >> hypoglycemia in the 60's on 2 mg daily. She had rapid acting insulin with steroid shots before.   At last visit, I advised her to take: - Metformin ER 500 mg 2x a day >> 1000 mg with dinner >> restarted >> 500 mg 2x a day >> 500 mg in am - >> not using -  >> stopped 2/2 cost - Lantus 10-12 units in HS - added back 04/2021 >> 12 units daily Stopped Lantus ~09/2020.  Pt checks her sugars twice a day: - am: 141, 200s >> 140-202 >> 112-170 >> 130-269 >> 98-120 >> 177, 186, 196 - 2h after b'fast: 97, 164, 173 >> n/c - Lunch:  low 100s >> n/c >> 163 >> n/c - 2h after lunch: 160 >> n/c - Dinnertime:  72 (06/2020) >> n/c >> 122, 129 >> 90-125 >> n/c - 2h after dinner: 128-250 >> 99, 200 >> 97-130 >> n/c - nighttime: 368 Lowest sugar was 56 x1 >> ...122 >> 90 >> 56 x1 (on Trulicity - 3 mg weekly from a neighbor);  she has hypoglycemia awareness in the 90s >> 70s. Highest sugar was  240 (steak and cheese) >> 269 >> 180 (sick) >> 360.  ReliOn meter.  -+ CKD, last BUN/creatinine:  Lab Results  Component Value Date   BUN 32 (H) 11/14/2021   CREATININE 1.80 (H) 11/14/2021   No MAU: 06/03/2021 (CCK): <4 Lab Results  Component Value Date   MICRALBCREAT 4 03/28/2016   MICRALBCREAT 0.6 11/16/2015   MICRALBCREAT 0.6 01/26/2013   MICRALBCREAT 0.3 03/24/2012   MICRALBCREAT 0.4 03/18/2011   MICRALBCREAT 11.4 10/29/2009   MICRALBCREAT 3.4 04/09/2009   MICRALBCREAT 17.1 01/25/2009   GFR: Lab Results  Component Value Date   GFRNONAA 29 (L) 11/25/2018   GFRNONAA 45 (L) 04/21/2017   GFRNONAA 54 (L) 02/27/2017   GFRNONAA 35 (L) 02/17/2017   GFRNONAA 52.79 (L) 06/19/2010   GFRNONAA 59.68 10/29/2009   GFRNONAA 53.52 07/03/2009   GFRNONAA 59.78 04/09/2009   GFRNONAA 59.82  01/25/2009   GFRNONAA 78 05/08/2008  On Lisinopril 10.  -+ HL; last set of lipids: Lab Results  Component Value Date   CHOL 141 11/14/2021   HDL 40.80 11/14/2021   LDLCALC 79 11/14/2021   LDLDIRECT 149.6 01/26/2013   TRIG 106.0 11/14/2021   CHOLHDL 3 11/14/2021  On Lipitor 40, Fenofibrate 160.  - last eye exam was in 2023: No DR reportedly. + cataracts.  -denies numbness and tingling in her feet.  She sees podiatry for ingrown toenails.  Last foot exam 09/03/2021.  She also has a history of HTN. 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  ROS: + See HPI  I reviewed pt's medications, allergies, PMH, social hx, family hx, and changes were documented in the history of present illness. Otherwise, unchanged from my initial visit note.  Past Medical History:  Diagnosis Date   Diabetes mellitus    Hyperlipidemia    Hypertension    Murmur    Past Surgical History:  Procedure Laterality Date   ABDOMINAL HYSTERECTOMY     BACK SURGERY     CHOLECYSTECTOMY     TONSILLECTOMY     TOTAL KNEE ARTHROPLASTY     TOTAL KNEE ARTHROPLASTY Right 02/26/2017   Procedure: RIGHT TOTAL KNEE ARTHROPLASTY;  Surgeon: Susa Day, MD;  Location: [facility];  Service: Orthopedics;  Laterality: Right;   Social History   Socioeconomic History   Marital status: Widowed    Spouse name: Not on file   Number of children: Not on file   Years of education: Not on file   Highest education level: Not on file  Occupational History   Not on file  Tobacco Use   Smoking status: Never   Smokeless tobacco: Never  Substance and Sexual Activity   Alcohol use: Yes    Comment: rare   Drug use: No   Sexual activity: Not Currently    Partners: Male  Other Topics Concern   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   Caffeine use: coffee in the am   Social Determinants of Health   Financial Resource Strain: Low Risk  (05/16/2021)   Overall Financial Resource Strain (CARDIA)     Difficulty of Paying Living Expenses: Not hard at all  Food Insecurity: No Food Insecurity (05/16/2021)   Hunger Vital Sign    Worried About Running Out of Food in the Last Year: Never true    [place] in the Last Year: Never true  Transportation Needs: No Transportation Needs (05/16/2021)   PRAPARE - Hydrologist (Medical):  No    Lack of Transportation (Non-Medical): No  Physical Activity: Inactive (05/16/2021)   Exercise Vital Sign    Days of Exercise per Week: 0 days    Minutes of Exercise per Session: 0 min  Stress: No Stress Concern Present (05/16/2021)   Luck    Feeling of Stress : Only a little  Social Connections: Moderately Isolated (05/16/2021)   Social Connection and Isolation Panel [NHANES]    Frequency of Communication with Friends and Family: More than three times a week    Frequency of Social Gatherings with Friends and Family: More than three times a week    Attends Religious Services: Never    Marine scientist or Organizations: Yes    Attends Music therapist: More than 4 times per year    Marital Status: Widowed  Intimate Partner Violence: Not At Risk (05/16/2021)   Humiliation, Afraid, Rape, and Kick questionnaire    Fear of Current or Ex-Partner: No    Emotionally Abused: No    Physically Abused: No    Sexually Abused: No   Current Outpatient Medications on File Prior to Visit  Medication Sig Dispense Refill   acetaminophen (TYLENOL) 650 MG CR tablet Take 650 mg by mouth every 8 (eight) hours as needed for pain.     amoxicillin (AMOXIL) 500 MG capsule 4 po 1 hr prior to procedure 4 capsule 2   Ascorbic Acid (VITAMIN C) 100 MG tablet Take 100 mg by mouth daily.     aspirin EC 81 MG tablet Take 81 mg by mouth daily.     atorvastatin (LIPITOR) 40 MG tablet TAKE 1 TABLET DAILY 90 tablet 3   cholecalciferol (VITAMIN D3) 25 MCG (1000 UNIT) tablet  Take 2,000 Units by mouth daily.     citalopram (CELEXA) 40 MG tablet TAKE 1 TABLET EVERY DAY 90 tablet 3   docusate sodium (COLACE) 100 MG capsule Take 1 capsule (100 mg total) by mouth 2 (two) times daily as needed for mild constipation. 30 capsule 1   Dulaglutide (TRULICITY) 1.5 OQ/9.4TM SOPN Inject 1.5 mg into the skin once a week. 6 mL 3   fenofibrate 160 MG tablet Take 1 tablet (160 mg total) by mouth daily. 90 tablet 1   fluticasone (FLONASE) 50 MCG/ACT nasal spray Place 2 sprays into both nostrils daily. 16 g 6   glipiZIDE (GLUCOTROL) 5 MG tablet Take 1 tablet (5 mg total) by mouth daily before supper. 90 tablet 3   glucose blood (ACCU-CHEK GUIDE) test strip USE 1 STRIP TO CHECK GLUCOSE ONCE DAILY 100 each 4   Insulin Admin Supplies MISC Use to inject insulin 2 times a day. 180 each 0   insulin glargine (LANTUS SOLOSTAR) 100 UNIT/ML Solostar Pen Inject 12 Units into the skin daily. 15 mL 11   Insulin Pen Needle 32G X 4 MM MISC Use 1x a day 100 each 3   Lancets (ACCU-CHEK MULTICLIX) lancets Use as instructed to test once a day DX E11.51 100 each 1   lisinopril-hydrochlorothiazide (ZESTORETIC) 10-12.5 MG tablet TAKE 1 TABLET DAILY 90 tablet 3   Loratadine-Pseudoephedrine (CLARITIN-D 12 HOUR PO) Take 1 tablet by mouth daily as needed (sinus).      meloxicam (MOBIC) 7.5 MG tablet TAKE 1 TABLET DAILY 90 tablet 3   metFORMIN (GLUCOPHAGE-XR) 500 MG 24 hr tablet Take 1 tablet (500 mg total) by mouth 2 (two) times daily. 180 tablet 3   Multiple Vitamins-Minerals (  MULTIVITAMIN ADULT PO) Take 1 tablet by mouth daily.      omeprazole (PRILOSEC) 20 MG capsule Take 1 capsule (20 mg total) by mouth daily. 90 capsule 3   tirzepatide (MOUNJARO) 5 MG/0.5ML Pen Inject 5 mg into the skin once a week. 2 mL 2   traZODone (DESYREL) 50 MG tablet TAKE 1/2 TO 1 TABLET BY MOUTH AT BEDTIME AS NEEDED FOR SLEEP 90 tablet 1   vitamin B-12 (CYANOCOBALAMIN) 100 MCG tablet Take 100 mcg by mouth daily.     zinc gluconate  50 MG tablet Take 50 mg by mouth daily.     No current facility-administered medications on file prior to visit.   Allergies  Allergen Reactions   Ozempic (0.25 Or 0.5 Mg-Dose) [Semaglutide(0.25 Or 0.'5mg'$ -Dos)] Diarrhea and Nausea And Vomiting    Pt stated, "I got dehydrated from this as well"   Family History  Problem Relation Age of Onset   Throat cancer Brother    Heart disease Brother        MI   Cancer Brother 80       throat,    Hyperlipidemia Brother    Cirrhosis Father    Alcohol abuse Father    Heart disease Brother    Cancer Brother    Arthritis Mother    Hypertension Sister    Arthritis Sister    Alzheimer's disease Sister    Cancer Brother    Cancer Brother 31       melanoma   Hypertension Brother    Hyperlipidemia Brother    Melanoma Other    Arthritis Other    PE: BP 140/90 (BP Location: Left Arm, Patient Position: Sitting, Cuff Size: Normal)   Pulse 63   Ht '5\' 2"'$  (1.575 m)   Wt 204 lb 3.2 oz (92.6 kg)   SpO2 90%   BMI 37.35 kg/m  Wt Readings from Last 3 Encounters:  01/21/22 204 lb 3.2 oz (92.6 kg)  11/14/21 196 lb 12.8 oz (89.3 kg)  09/03/21 196 lb 12.8 oz (89.3 kg)   Constitutional: overweight, in NAD Eyes: PERRLA, EOMI, no exophthalmos ENT: moist mucous membranes, no thyromegaly, no cervical lymphadenopathy Cardiovascular: RRR, No MRG, B LE edema  Respiratory: CTA B Musculoskeletal: no deformities Skin: moist, warm, no rashes Neurological: no tremor with outstretched hands  ASSESSMENT: 1. DM2, insulin-dependent, uncontrolled, with long-term complications: - CKD  2. HL   3. Obesity class II  PLAN:  1. Patient with history of uncontrolled diabetes, on oral antidiabetic regimen with metformin, also previously on weekly GLP-1 receptor agonist and basal insulin, with improved control in the last year.  At last visit, HbA1c was excellent, at 6.3%.  She did not have to take glipizide before dinner, so I took it off her list.  We did not change  her regimen.  Unfortunately, since then, she had to come off Trulicity due to lack of availability approximately 1 month ago.  She was previously not able to tolerate Ozempic. -At today's visit, she does not have many blood sugar checks since stopping Trulicity, but whenever she checked these were quite high.  At this visit, I advised her to increase the dose of Lantus but we also discussed about restarting Jardiance.  I am hoping that she can get this either from the pharmacy or from the patient assistance program.  We also gave her 2 weeks worth of samples.  I advised her to start at 10 mg of Jardiance daily and then switch to 12.5 mg.  If she does well with these, we can increase the dose to 25 mg daily.  We will continue the same metformin dose for now. - I suggested to:  Patient Instructions  Please continue: - Metformin 500 mg in am  Increase:  - Lantus 15 units daily  Add: - Jardiance 12.5 mg daily before b'fast  Please return in  3 months with your sugar log.   - we checked her HbA1c: 6.9% (higher) - advised to check sugars at different times of the day - 1x a day, rotating check times - advised for yearly eye exams >> she is UTD - return to clinic in 4 months   2. HL -Reviewed latest lipid panel from 11/2021: LDL slightly above our target of less than 70: Lab Results  Component Value Date   CHOL 141 11/14/2021   HDL 40.80 11/14/2021   LDLCALC 79 11/14/2021   LDLDIRECT 149.6 01/26/2013   TRIG 106.0 11/14/2021   CHOLHDL 3 11/14/2021  -She continues on Lipitor 40 mg daily and fenofibrate 160 mg daily without side effects.  3.  Obesity class II -She could not tolerate Ozempic due to nausea and vomiting, however, she tolerated Trulicity well - unfortunately, this is not covered for her -She gained 11 pounds after starting insulin, before last visit. - now she gained 8 lbs off Trulicity -We will try to start Jardiance which should also help with weight loss  Philemon Kingdom,  MD PhD [facility] End[facility]

## 2022-02-17 ENCOUNTER — Other Ambulatory Visit: Payer: Self-pay | Admitting: Internal Medicine

## 2022-02-17 ENCOUNTER — Telehealth: Payer: Self-pay

## 2022-02-17 MED ORDER — TRULICITY 1.5 MG/0.5ML ~~LOC~~ SOAJ: 1.5000 mg | SUBCUTANEOUS | 3 refills | Status: DC

## 2022-02-17 NOTE — Telephone Encounter (Signed)
Bethany Miller, I put the prescription for Trulicity in the chart, but I am not sure if she wants [place] to send it to the patient assistance program or to the pharmacy.  If he needs to be sent to [facility], will need to fill out the paperwork.

## 2022-02-17 NOTE — Telephone Encounter (Signed)
Patient states that [name] will cover the Trulicity and just needs a script sent over. Patient would rather do the Trulicity instead of the Jardiance .

## 2022-02-18 NOTE — Telephone Encounter (Signed)
Patient notified that script has been sent for the Trulicity to [facility].

## 2022-03-06 ENCOUNTER — Telehealth: Payer: Self-pay

## 2022-03-06 NOTE — Telephone Encounter (Signed)
Patient reports diarrhea since Monday. She started trulicity a week ago and thinks it may be from the medication. Imodium not helping.  Will like to see Dr. Etter Sjogren and no appointments available.

## 2022-03-07 NOTE — Telephone Encounter (Signed)
Spoke with patient. Pt states sxs have improved and she is going out of town. Pt advised to call back next week if sxs return

## 2022-03-20 DIAGNOSIS — H25813 Combined forms of age-related cataract, bilateral: Secondary | ICD-10-CM | POA: Diagnosis not present

## 2022-03-25 DIAGNOSIS — L570 Actinic keratosis: Secondary | ICD-10-CM | POA: Diagnosis not present

## 2022-03-25 DIAGNOSIS — Z86008 Personal history of in-situ neoplasm of other site: Secondary | ICD-10-CM | POA: Diagnosis not present

## 2022-05-01 ENCOUNTER — Encounter: Payer: Self-pay | Admitting: Family Medicine

## 2022-05-01 ENCOUNTER — Encounter: Payer: Self-pay | Admitting: Internal Medicine

## 2022-05-01 ENCOUNTER — Ambulatory Visit (INDEPENDENT_AMBULATORY_CARE_PROVIDER_SITE_OTHER): Payer: Medicare Other | Admitting: Internal Medicine

## 2022-05-01 VITALS — BP 120/74 | HR 66 | Ht 62.0 in | Wt 194.2 lb

## 2022-05-01 DIAGNOSIS — Z794 Long term (current) use of insulin: Secondary | ICD-10-CM

## 2022-05-01 DIAGNOSIS — E785 Hyperlipidemia, unspecified: Secondary | ICD-10-CM

## 2022-05-01 DIAGNOSIS — K219 Gastro-esophageal reflux disease without esophagitis: Secondary | ICD-10-CM

## 2022-05-01 DIAGNOSIS — E669 Obesity, unspecified: Secondary | ICD-10-CM | POA: Diagnosis not present

## 2022-05-01 DIAGNOSIS — E1122 Type 2 diabetes mellitus with diabetic chronic kidney disease: Secondary | ICD-10-CM | POA: Diagnosis not present

## 2022-05-01 DIAGNOSIS — N1832 Chronic kidney disease, stage 3b: Secondary | ICD-10-CM

## 2022-05-01 DIAGNOSIS — Z23 Encounter for immunization: Secondary | ICD-10-CM

## 2022-05-01 LAB — POCT GLYCOSYLATED HEMOGLOBIN (HGB A1C): Hemoglobin A1C: 7 % — AB (ref 4.0–5.6)

## 2022-05-01 MED ORDER — METFORMIN HCL ER 500 MG PO TB24: 500.0000 mg | ORAL_TABLET | Freq: Two times a day (BID) | ORAL | 3 refills | Status: DC

## 2022-05-01 MED ORDER — LANTUS SOLOSTAR 100 UNIT/ML ~~LOC~~ SOPN: 10.0000 [IU] | PEN_INJECTOR | Freq: Every day | SUBCUTANEOUS | 3 refills | Status: DC

## 2022-05-01 NOTE — Progress Notes (Signed)
Patient ID: Bethany Miller, female   DOB: [birth year], 76 y.o.   MRN: [id number]  HPI: Bethany Miller is a 76 y.o.-year-old female, presenting for f/u for DM2, dx 2010, insulin-dependent, uncontrolled, with complications (CKD).  Last visit 3.5 months ago. She has [employer] part D for meds, [employer], and [employer] for the rest.   Interim history: No increased urination, blurry vision, nchest pain. For last visit, she had to stop Trulicity because she did not receive it from the patient assistance program anymore.  Now back on it  - had some nausea and diarrhea, improving.  Reviewed HbA1c levels: Lab Results  Component Value Date   HGBA1C 6.9 (A) 01/21/2022   HGBA1C 6.3 (A) 09/03/2021   HGBA1C 7.3 (A) 04/16/2021   HGBA1C 7.0 (A) 12/07/2020   HGBA1C 8.1 (A) 08/31/2020   HGBA1C 7.4 (A) 02/28/2020   HGBA1C 9.2 (H) 10/06/2019   HGBA1C 8.6 (H) 04/01/2019   HGBA1C 10.4 (H) 11/30/2018   HGBA1C 9.5 (A) 08/26/2018   HGBA1C 9.8 (A) 03/31/2018   HGBA1C 8.4 11/26/2017   HGBA1C 7.0 07/30/2017   HGBA1C 7.6 (H) 04/21/2017   HGBA1C 7.4 (H) 03/31/2017   HGBA1C 8.5 (H) 02/17/2017   HGBA1C 8.6 (H) 11/11/2016   HGBA1C 7.9% 07/03/2016   HGBA1C 8.8 (H) 03/28/2016   HGBA1C 8.1 (H) 11/16/2015   Previously on: - Metformin ER 1000 mg 2x a day (2000 mg with dinner did not change the sugars) >> 1000 mg with breakfast - Glipizide 5 >> 10 mg 2x a day before breakfast and dinner - >> stopped 02/2020 by Dr. Kelton Pillar We tried Ozempic but she developed nausea and vomiting and ended up in the emergency room in 11/2018. We again tried [place]  - added again 04/2018 >> not affordable Stopped  Glipizide 5 mg 2x a day 06/2017 b/c lows, but restarted afterwards. Tradjenta and Alogliptin were not covered She was previously on Invokana, Jardiance 10 mg daily >> too expensive. Tried Cycloset >> too expensive Tried Januvia >>  too expensive.  Tried Metformin 500 mg po >> severe diarrhea. She was on Glimepiride  >> hypoglycemia in the 60's on 2 mg daily. She had rapid acting insulin with steroid shots before.   At last visit, I advised her to take: - Metformin ER 500 mg 2x a day >> .Marland KitchenMarland Kitchen500 mg in am >> 500 mg 2x a day - Jardiance 12.5 mg daily >> increased urination >> vulvar irritation >> stopped - Lantus 10-12 units in HS - added back 04/2021 >> 12 >> 15 >> 10-12 units daily, - Trulicity 1.5 mg weekly- restarted (PAP) Stopped Lantus ~09/2020>> restarted. Previously on glipizide 5 mg before a larger dinner.  Pt checks her sugars twice a day: - am: 130-269 >> 98-120 >> 177, 186, 196 >> 88-140, ave 110-120, 188 - raisins - 2h after b'fast: 97, 164, 173 >> n/c - Lunch:  low 100s >> n/c >> 163 >> n/c - 2h after lunch: 160 >> n/c - Dinnertime: 72  >> n/c >> 122, 129 >> 90-125 >> n/c - 2h after dinner: 128-250 >> 99, 200 >> 97-130 >> n/c - nighttime: 368 >> n/c >> 130 Lowest sugar was 56 x1 (on Trulicity - 3 mg weekly from a neighbor) >> 88;  she has hypoglycemia awareness in the 90s >> 70s. Highest sugar was 180 (sick) >> 360 >> 188  ReliOn meter.  -+ CKD, last BUN/creatinine:  Lab Results  Component Value Date   BUN 32 (H) 11/14/2021  CREATININE 1.80 (H) 11/14/2021   No MAU: 06/03/2021 (CCK): <4 Lab Results  Component Value Date   MICRALBCREAT 4 03/28/2016   MICRALBCREAT 0.6 11/16/2015   MICRALBCREAT 0.6 01/26/2013   MICRALBCREAT 0.3 03/24/2012   MICRALBCREAT 0.4 03/18/2011   MICRALBCREAT 11.4 10/29/2009   MICRALBCREAT 3.4 04/09/2009   MICRALBCREAT 17.1 01/25/2009   GFR: Lab Results  Component Value Date   GFRNONAA 29 (L) 11/25/2018   GFRNONAA 45 (L) 04/21/2017   GFRNONAA 54 (L) 02/27/2017   GFRNONAA 35 (L) 02/17/2017   GFRNONAA 52.79 (L) 06/19/2010   GFRNONAA 59.68 10/29/2009   GFRNONAA 53.52 07/03/2009   GFRNONAA 59.78 04/09/2009   GFRNONAA 59.82 01/25/2009   GFRNONAA 78 05/08/2008  On Lisinopril 10.  -+ HL; last set of lipids: Lab Results  Component Value Date    CHOL 141 11/14/2021   HDL 40.80 11/14/2021   LDLCALC 79 11/14/2021   LDLDIRECT 149.6 01/26/2013   TRIG 106.0 11/14/2021   CHOLHDL 3 11/14/2021  On Lipitor 40, Fenofibrate 160.  - last eye exam was in 2023: No DR reportedly. + cataracts.  -denies numbness and tingling in her feet.  She sees podiatry for ingrown toenails.  Last foot exam 09/03/2021.  She also has a history of HTN. 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  ROS: + See HPI  I reviewed pt's medications, allergies, PMH, social hx, family hx, and changes were documented in the history of present illness. Otherwise, unchanged from my initial visit note.  Past Medical History:  Diagnosis Date   Diabetes mellitus    Hyperlipidemia    Hypertension    Murmur    Past Surgical History:  Procedure Laterality Date   ABDOMINAL HYSTERECTOMY     BACK SURGERY     CHOLECYSTECTOMY     TONSILLECTOMY     TOTAL KNEE ARTHROPLASTY     TOTAL KNEE ARTHROPLASTY Right 02/26/2017   Procedure: RIGHT TOTAL KNEE ARTHROPLASTY;  Surgeon: Susa Day, MD;  Location: [facility];  Service: Orthopedics;  Laterality: Right;   Social History   Socioeconomic History   Marital status: Widowed    Spouse name: Not on file   Number of children: Not on file   Years of education: Not on file   Highest education level: Not on file  Occupational History   Not on file  Tobacco Use   Smoking status: Never   Smokeless tobacco: Never  Substance and Sexual Activity   Alcohol use: Yes    Comment: rare   Drug use: No   Sexual activity: Not Currently    Partners: Male  Other Topics Concern   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   Caffeine use: coffee in the am   Social Determinants of Health   Financial Resource Strain: Low Risk  (05/16/2021)   Overall Financial Resource Strain (CARDIA)    Difficulty of Paying Living Expenses: Not hard at all  Food Insecurity: No Food Insecurity (05/16/2021)   Hunger Vital Sign     Worried About Running Out of Food in the Last Year: Never true    [place] in the Last Year: Never true  Transportation Needs: No Transportation Needs (05/16/2021)   PRAPARE - Hydrologist (Medical): No    Lack of Transportation (Non-Medical): No  Physical Activity: Inactive (05/16/2021)   Exercise Vital Sign    Days of Exercise per Week: 0 days    Minutes of  Exercise per Session: 0 min  Stress: No Stress Concern Present (05/16/2021)   [place]    Feeling of Stress : Only a little  Social Connections: Moderately Isolated (05/16/2021)   Social Connection and Isolation Panel [NHANES]    Frequency of Communication with Friends and Family: More than three times a week    Frequency of Social Gatherings with Friends and Family: More than three times a week    Attends Religious Services: Never    Marine scientist or Organizations: Yes    Attends Music therapist: More than 4 times per year    Marital Status: Widowed  Intimate Partner Violence: Not At Risk (05/16/2021)   Humiliation, Afraid, Rape, and Kick questionnaire    Fear of Current or Ex-Partner: No    Emotionally Abused: No    Physically Abused: No    Sexually Abused: No   Current Outpatient Medications on File Prior to Visit  Medication Sig Dispense Refill   acetaminophen (TYLENOL) 650 MG CR tablet Take 650 mg by mouth every 8 (eight) hours as needed for pain.     amoxicillin (AMOXIL) 500 MG capsule 4 po 1 hr prior to procedure 4 capsule 2   Ascorbic Acid (VITAMIN C) 100 MG tablet Take 100 mg by mouth daily.     aspirin EC 81 MG tablet Take 81 mg by mouth daily.     atorvastatin (LIPITOR) 40 MG tablet TAKE 1 TABLET DAILY 90 tablet 3   cholecalciferol (VITAMIN D3) 25 MCG (1000 UNIT) tablet Take 2,000 Units by mouth daily.     citalopram (CELEXA) 40 MG tablet TAKE 1 TABLET EVERY DAY 90 tablet 3   docusate sodium  (COLACE) 100 MG capsule Take 1 capsule (100 mg total) by mouth 2 (two) times daily as needed for mild constipation. 30 capsule 1   Dulaglutide (TRULICITY) 1.5 DX/4.1OI SOPN Inject 1.5 mg into the skin once a week. 12 mL 3   empagliflozin (JARDIANCE) 25 MG TABS tablet Take 1 tablet (25 mg total) by mouth daily before breakfast. 90 tablet 3   fenofibrate 160 MG tablet Take 1 tablet (160 mg total) by mouth daily. 90 tablet 1   fluticasone (FLONASE) 50 MCG/ACT nasal spray Place 2 sprays into both nostrils daily. 16 g 6   glucose blood (ACCU-CHEK GUIDE) test strip USE 1 STRIP TO CHECK GLUCOSE ONCE DAILY 100 each 4   Insulin Admin Supplies MISC Use to inject insulin 2 times a day. 180 each 0   insulin glargine (LANTUS SOLOSTAR) 100 UNIT/ML Solostar Pen Inject 15 Units into the skin daily. 30 mL 3   Insulin Pen Needle 32G X 4 MM MISC Use 1x a day 100 each 3   Lancets (ACCU-CHEK MULTICLIX) lancets Use as instructed to test once a day DX E11.51 100 each 1   lisinopril-hydrochlorothiazide (ZESTORETIC) 10-12.5 MG tablet TAKE 1 TABLET DAILY 90 tablet 3   Loratadine-Pseudoephedrine (CLARITIN-D 12 HOUR PO) Take 1 tablet by mouth daily as needed (sinus).      meloxicam (MOBIC) 7.5 MG tablet TAKE 1 TABLET DAILY 90 tablet 3   metFORMIN (GLUCOPHAGE-XR) 500 MG 24 hr tablet Take 1 tablet (500 mg total) by mouth 2 (two) times daily. 180 tablet 3   Multiple Vitamins-Minerals (MULTIVITAMIN ADULT PO) Take 1 tablet by mouth daily.      omeprazole (PRILOSEC) 20 MG capsule Take 1 capsule (20 mg total) by mouth daily. 90 capsule 3  traZODone (DESYREL) 50 MG tablet TAKE 1/2 TO 1 TABLET BY MOUTH AT BEDTIME AS NEEDED FOR SLEEP 90 tablet 1   vitamin B-12 (CYANOCOBALAMIN) 100 MCG tablet Take 100 mcg by mouth daily.     zinc gluconate 50 MG tablet Take 50 mg by mouth daily.     No current facility-administered medications on file prior to visit.   Allergies  Allergen Reactions   Ozempic (0.25 Or 0.5 Mg-Dose)  [Semaglutide(0.25 Or 0.'5mg'$ -Dos)] Diarrhea and Nausea And Vomiting    Pt stated, "I got dehydrated from this as well"   Family History  Problem Relation Age of Onset   Throat cancer Brother    Heart disease Brother        MI   Cancer Brother 80       throat,    Hyperlipidemia Brother    Cirrhosis Father    Alcohol abuse Father    Heart disease Brother    Cancer Brother    Arthritis Mother    Hypertension Sister    Arthritis Sister    Alzheimer's disease Sister    Cancer Brother    Cancer Brother 24       melanoma   Hypertension Brother    Hyperlipidemia Brother    Melanoma Other    Arthritis Other    PE: BP 120/74 (BP Location: Left Arm, Patient Position: Sitting, Cuff Size: Normal)   Pulse 66   Ht '5\' 2"'$  (1.575 m)   Wt 194 lb 3.2 oz (88.1 kg)   SpO2 95%   BMI 35.52 kg/m  Wt Readings from Last 3 Encounters:  05/01/22 194 lb 3.2 oz (88.1 kg)  01/21/22 204 lb 3.2 oz (92.6 kg)  11/14/21 196 lb 12.8 oz (89.3 kg)   Constitutional: overweight, in NAD Eyes:EOMI, no exophthalmos ENT: no thyromegaly, no cervical lymphadenopathy Cardiovascular: RRR, No MRG, B LE edema  Respiratory: CTA B Musculoskeletal: no deformities Skin: no rashes Neurological: no tremor with outstretched hands Diabetic Foot Exam - Simple   Simple Foot Form Diabetic Foot exam was performed with the following findings: Yes 05/01/2022  2:22 PM  Visual Inspection No deformities, no ulcerations, no other skin breakdown bilaterally: Yes Sensation Testing Intact to touch and monofilament testing bilaterally: Yes Pulse Check Posterior Tibialis and Dorsalis pulse intact bilaterally: Yes Comments    ASSESSMENT: 1. DM2, insulin-dependent, uncontrolled, with long-term complications: - CKD  2. HL   3. Obesity class II  PLAN:  1. Patient with history of uncontrolled diabetes, on oral antidiabetic regimen with metformin, and also long-acting insulin, which we increased at last visit.  She was previously  on Trulicity, with excellent HbA1c, at 6.3%, however, she had to come off Trulicity due to lack of availability and then coverage.  We discussed about trying to get it from the patient assistance program and we gave her samples at last visit.  We also discussed about starting Jardiance.  HbA1c at last visit was higher, at 6.9%, but still at goal. - at today's visit, sugars appear to be at goal in the morning, except for occasional higher numbers when she has dietary indiscretions (chocolate covered raisins).  I recommended against eating dried fruit due to the high concentration of sugar. -However, otherwise, she feels she is doing well on the current regimen.  She has had some nausea and diarrhea after starting Trulicity, but she is not doing well.  She is trying to take 2 metformin tablets a day.  In some days, she only gets 1, but  trying to remember to take 2.  We will continue current regimen for now. - I suggested to:  Patient Instructions  Please continue: - Metformin 500 mg 2x a day - Lantus 10-12 units daily - Trulicity 1.5 mg weekly   Please return in  4 months with your sugar log.   - we checked her HbA1c: 7.0% (slightly higher) - advised to check sugars at different times of the day - 1x a day, rotating check times - advised for yearly eye exams >> she is UTD - return to clinic in 4 months   2. HL -Reviewed the latest lipid panel from 11/2021: Fractions at goal except LDL slightly above our target of less than 70: Lab Results  Component Value Date   CHOL 141 11/14/2021   HDL 40.80 11/14/2021   LDLCALC 79 11/14/2021   LDLDIRECT 149.6 01/26/2013   TRIG 106.0 11/14/2021   CHOLHDL 3 11/14/2021  -She is on Lipitor 40 mg daily and fenofibrate 160 mg daily without side effects  3.  Obesity class II -She could not tolerate Ozempic due to nausea and vomiting, but she was tolerating Trulicity well.  Unfortunately, this was not covered for her  -At last visit we tried to start  [place] which would have also help with weight loss, however, afterwards, she contacted [place] to try to obtain Trulicity from the patient assistance program. -At last visit, she was up 8 pounds after stopping Trulicity -lost 10 lbs since last OV  + flu shot today  Philemon Kingdom, MD PhD [facility]

## 2022-05-01 NOTE — Patient Instructions (Addendum)
Please continue: - Metformin 500 mg2x a day - Lantus 10-12 units daily - Trulicity 1.5 mg weekly   Please return in  4 months with your sugar log.

## 2022-05-02 MED ORDER — OMEPRAZOLE 20 MG PO CPDR: 20.0000 mg | DELAYED_RELEASE_CAPSULE | Freq: Every day | ORAL | 1 refills | Status: DC

## 2022-05-20 ENCOUNTER — Encounter: Payer: Self-pay | Admitting: Family Medicine

## 2022-05-20 ENCOUNTER — Ambulatory Visit (INDEPENDENT_AMBULATORY_CARE_PROVIDER_SITE_OTHER): Payer: Medicare Other | Admitting: Family Medicine

## 2022-05-20 VITALS — BP 120/70 | HR 58 | Temp 97.8°F | Resp 18 | Ht 62.0 in | Wt 194.8 lb

## 2022-05-20 DIAGNOSIS — E559 Vitamin D deficiency, unspecified: Secondary | ICD-10-CM | POA: Diagnosis not present

## 2022-05-20 DIAGNOSIS — N184 Chronic kidney disease, stage 4 (severe): Secondary | ICD-10-CM

## 2022-05-20 DIAGNOSIS — N1832 Chronic kidney disease, stage 3b: Secondary | ICD-10-CM | POA: Diagnosis not present

## 2022-05-20 DIAGNOSIS — E538 Deficiency of other specified B group vitamins: Secondary | ICD-10-CM

## 2022-05-20 DIAGNOSIS — E1122 Type 2 diabetes mellitus with diabetic chronic kidney disease: Secondary | ICD-10-CM

## 2022-05-20 DIAGNOSIS — E1169 Type 2 diabetes mellitus with other specified complication: Secondary | ICD-10-CM | POA: Diagnosis not present

## 2022-05-20 DIAGNOSIS — I10 Essential (primary) hypertension: Secondary | ICD-10-CM

## 2022-05-20 DIAGNOSIS — E785 Hyperlipidemia, unspecified: Secondary | ICD-10-CM

## 2022-05-20 DIAGNOSIS — E1142 Type 2 diabetes mellitus with diabetic polyneuropathy: Secondary | ICD-10-CM

## 2022-05-20 DIAGNOSIS — Z8249 Family history of ischemic heart disease and other diseases of the circulatory system: Secondary | ICD-10-CM | POA: Diagnosis not present

## 2022-05-20 LAB — CBC WITH DIFFERENTIAL/PLATELET
Basophils Absolute: 0 10*3/uL (ref 0.0–0.1)
Basophils Relative: 0.5 % (ref 0.0–3.0)
Eosinophils Absolute: 0.1 10*3/uL (ref 0.0–0.7)
Eosinophils Relative: 2 % (ref 0.0–5.0)
HCT: 37 % (ref 36.0–46.0)
Hemoglobin: 12.2 g/dL (ref 12.0–15.0)
Lymphocytes Relative: 22.3 % (ref 12.0–46.0)
Lymphs Abs: 1.4 10*3/uL (ref 0.7–4.0)
MCHC: 32.9 g/dL (ref 30.0–36.0)
MCV: 88.6 fl (ref 78.0–100.0)
Monocytes Absolute: 0.3 10*3/uL (ref 0.1–1.0)
Monocytes Relative: 4.9 % (ref 3.0–12.0)
Neutro Abs: 4.5 10*3/uL (ref 1.4–7.7)
Neutrophils Relative %: 70.3 % (ref 43.0–77.0)
Platelets: 242 10*3/uL (ref 150.0–400.0)
RBC: 4.18 Mil/uL (ref 3.87–5.11)
RDW: 14.7 % (ref 11.5–15.5)
WBC: 6.4 10*3/uL (ref 4.0–10.5)

## 2022-05-20 LAB — LIPID PANEL
Cholesterol: 149 mg/dL (ref 0–200)
HDL: 40.3 mg/dL (ref >39.00)
LDL Cholesterol: 84 mg/dL (ref 0–99)
NonHDL: 108.9
Total CHOL/HDL Ratio: 4
Triglycerides: 123 mg/dL (ref 0.0–149.0)
VLDL: 24.6 mg/dL (ref 0.0–40.0)

## 2022-05-20 LAB — COMPREHENSIVE METABOLIC PANEL
ALT: 12 U/L (ref 0–35)
AST: 16 U/L (ref 0–37)
Albumin: 3.9 g/dL (ref 3.5–5.2)
Alkaline Phosphatase: 98 U/L (ref 39–117)
BUN: 21 mg/dL (ref 6–23)
CO2: 26 mEq/L (ref 19–32)
Calcium: 9.1 mg/dL (ref 8.4–10.5)
Chloride: 104 mEq/L (ref 96–112)
Creatinine, Ser: 1.16 mg/dL (ref 0.40–1.20)
GFR: 46.37 mL/min — ABNORMAL LOW (ref >60.00)
Glucose, Bld: 96 mg/dL (ref 70–99)
Potassium: 4.7 mEq/L (ref 3.5–5.1)
Sodium: 137 mEq/L (ref 135–145)
Total Bilirubin: 0.5 mg/dL (ref 0.2–1.2)
Total Protein: 6.5 g/dL (ref 6.0–8.3)

## 2022-05-20 LAB — VITAMIN D 25 HYDROXY (VIT D DEFICIENCY, FRACTURES): VITD: 45.42 ng/mL (ref 30.00–100.00)

## 2022-05-20 LAB — VITAMIN B12: Vitamin B-12: 632 pg/mL (ref 211–911)

## 2022-05-20 NOTE — Progress Notes (Addendum)
Subjective:   By signing my name below, I, Shehryar Baig, attest that this documentation has been prepared under the direction and in the presence of Ann Held, DO. 05/20/2022    Patient ID: Bethany Miller, female    DOB: 10/22/1945, 76 y.o.   MRN: [id number]  Chief Complaint  Patient presents with   Hypertension   Hyperlipidemia   Follow-up    Hypertension Pertinent negatives include no chest pain, headaches, malaise/fatigue, palpitations or shortness of breath.  Hyperlipidemia Pertinent negatives include no chest pain, myalgias or shortness of breath.   Patient is in today for a follow up visit.   She is requesting to complete blood work during this visit. She reports her twin brother is having heart issues and is under going an open heart procedure in the future. She is inquiring if she can have any similar conditions. She has occasional arm numbness but thinks her nerves may be the cause for it. She is UTD on flu vaccines this year. She is eligible for the shingrix vaccine and is interested in receiving it at her pharmacy. She is interested in receiving the new Covid-19 booster vaccine.  She is not interested in a bone density scan this year. She is UTD on vision care.    Past Medical History:  Diagnosis Date   Diabetes mellitus    Hyperlipidemia    Hypertension    Murmur     Past Surgical History:  Procedure Laterality Date   ABDOMINAL HYSTERECTOMY     BACK SURGERY     CHOLECYSTECTOMY     TONSILLECTOMY     TOTAL KNEE ARTHROPLASTY     TOTAL KNEE ARTHROPLASTY Right 02/26/2017   Procedure: RIGHT TOTAL KNEE ARTHROPLASTY;  Surgeon: Susa Day, MD;  Location: [facility];  Service: Orthopedics;  Laterality: Right;    Family History  Problem Relation Age of Onset   Throat cancer Brother    Heart disease Brother        MI   Cancer Brother 12       throat,    Hyperlipidemia Brother    Cirrhosis Father    Alcohol abuse Father    Heart disease Brother     Cancer Brother    Arthritis Mother    Hypertension Sister    Arthritis Sister    Alzheimer's disease Sister    Cancer Brother    Cancer Brother 12       melanoma   Hypertension Brother    Hyperlipidemia Brother    Melanoma Other    Arthritis Other     Social History   Socioeconomic History   Marital status: Widowed    Spouse name: Not on file   Number of children: Not on file   Years of education: Not on file   Highest education level: Not on file  Occupational History   Not on file  Tobacco Use   Smoking status: Never   Smokeless tobacco: Never  Substance and Sexual Activity   Alcohol use: Yes    Comment: rare   Drug use: No   Sexual activity: Not Currently    Partners: Male  Other Topics Concern   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   Caffeine use: coffee in the am   Social Determinants of Health   Financial Resource Strain: Low Risk  (05/16/2021)   Overall Financial Resource Strain (CARDIA)    Difficulty of Paying Living Expenses:  Not hard at all  Food Insecurity: No Food Insecurity (05/16/2021)   Hunger Vital Sign    Worried About Running Out of Food in the Last Year: Never true    Ran Out of Food in the Last Year: Never true  Transportation Needs: No Transportation Needs (05/16/2021)   PRAPARE - Hydrologist (Medical): No    Lack of Transportation (Non-Medical): No  Physical Activity: Inactive (05/16/2021)   Exercise Vital Sign    Days of Exercise per Week: 0 days    Minutes of Exercise per Session: 0 min  Stress: No Stress Concern Present (05/16/2021)   [place]    Feeling of Stress : Only a little  Social Connections: Moderately Isolated (05/16/2021)   Social Connection and Isolation Panel [NHANES]    Frequency of Communication with Friends and Family: More than three times a week    Frequency of Social  Gatherings with Friends and Family: More than three times a week    Attends Religious Services: Never    Marine scientist or Organizations: Yes    Attends Music therapist: More than 4 times per year    Marital Status: Widowed  Intimate Partner Violence: Not At Risk (05/16/2021)   Humiliation, Afraid, Rape, and Kick questionnaire    Fear of Current or Ex-Partner: No    Emotionally Abused: No    Physically Abused: No    Sexually Abused: No    Outpatient Medications Prior to Visit  Medication Sig Dispense Refill   acetaminophen (TYLENOL) 650 MG CR tablet Take 650 mg by mouth every 8 (eight) hours as needed for pain.     amoxicillin (AMOXIL) 500 MG capsule 4 po 1 hr prior to procedure 4 capsule 2   Ascorbic Acid (VITAMIN C) 100 MG tablet Take 100 mg by mouth daily.     aspirin EC 81 MG tablet Take 81 mg by mouth daily.     atorvastatin (LIPITOR) 40 MG tablet TAKE 1 TABLET DAILY 90 tablet 3   cholecalciferol (VITAMIN D3) 25 MCG (1000 UNIT) tablet Take 2,000 Units by mouth daily.     citalopram (CELEXA) 40 MG tablet TAKE 1 TABLET EVERY DAY 90 tablet 3   docusate sodium (COLACE) 100 MG capsule Take 1 capsule (100 mg total) by mouth 2 (two) times daily as needed for mild constipation. 30 capsule 1   Dulaglutide (TRULICITY) 1.5 XB/9.3JQ SOPN Inject 1.5 mg into the skin once a week. 12 mL 3   fenofibrate 160 MG tablet Take 1 tablet (160 mg total) by mouth daily. 90 tablet 1   fluticasone (FLONASE) 50 MCG/ACT nasal spray Place 2 sprays into both nostrils daily. 16 g 6   glucose blood (ACCU-CHEK GUIDE) test strip USE 1 STRIP TO CHECK GLUCOSE ONCE DAILY 100 each 4   Insulin Admin Supplies MISC Use to inject insulin 2 times a day. 180 each 0   insulin glargine (LANTUS SOLOSTAR) 100 UNIT/ML Solostar Pen Inject 10-15 Units into the skin daily. 30 mL 3   Insulin Pen Needle 32G X 4 MM MISC Use 1x a day 100 each 3   Lancets (ACCU-CHEK MULTICLIX) lancets Use as instructed to test once a  day DX E11.51 100 each 1   lisinopril-hydrochlorothiazide (ZESTORETIC) 10-12.5 MG tablet TAKE 1 TABLET DAILY 90 tablet 3   Loratadine-Pseudoephedrine (CLARITIN-D 12 HOUR PO) Take 1 tablet by mouth daily as needed (sinus).  meloxicam (MOBIC) 7.5 MG tablet TAKE 1 TABLET DAILY 90 tablet 3   metFORMIN (GLUCOPHAGE-XR) 500 MG 24 hr tablet Take 1 tablet (500 mg total) by mouth 2 (two) times daily. 180 tablet 3   Multiple Vitamins-Minerals (MULTIVITAMIN ADULT PO) Take 1 tablet by mouth daily.      omeprazole (PRILOSEC) 20 MG capsule Take 1 capsule (20 mg total) by mouth daily. 90 capsule 1   traZODone (DESYREL) 50 MG tablet TAKE 1/2 TO 1 TABLET BY MOUTH AT BEDTIME AS NEEDED FOR SLEEP 90 tablet 1   vitamin B-12 (CYANOCOBALAMIN) 100 MCG tablet Take 100 mcg by mouth daily.     zinc gluconate 50 MG tablet Take 50 mg by mouth daily.     No facility-administered medications prior to visit.    Allergies  Allergen Reactions   Ozempic (0.25 Or 0.5 Mg-Dose) [Semaglutide(0.25 Or 0.'5mg'$ -Dos)] Diarrhea and Nausea And Vomiting    Pt stated, "I got dehydrated from this as well"    Review of Systems  Constitutional:  Negative for chills, fever and malaise/fatigue.  HENT:  Negative for congestion and hearing loss.   Eyes:  Negative for discharge.  Respiratory:  Negative for cough, sputum production and shortness of breath.   Cardiovascular:  Negative for chest pain, palpitations and leg swelling.  Gastrointestinal:  Negative for abdominal pain, blood in stool, constipation, diarrhea, heartburn, nausea and vomiting.  Genitourinary:  Negative for dysuria, frequency, hematuria and urgency.  Musculoskeletal:  Negative for back pain, falls and myalgias.  Skin:  Negative for rash.  Neurological:  Negative for dizziness, sensory change, loss of consciousness, weakness and headaches.  Endo/Heme/Allergies:  Negative for environmental allergies. Does not bruise/bleed easily.  Psychiatric/Behavioral:  Negative for  depression and suicidal ideas. The patient is not nervous/anxious and does not have insomnia.        Objective:    Physical Exam Vitals and nursing note reviewed.  Constitutional:      General: She is not in acute distress.    Appearance: Normal appearance. She is well-developed. She is not ill-appearing.  HENT:     Head: Normocephalic and atraumatic.     Right Ear: External ear normal.     Left Ear: External ear normal.  Eyes:     Extraocular Movements: Extraocular movements intact.     Conjunctiva/sclera: Conjunctivae normal.     Pupils: Pupils are equal, round, and reactive to light.  Neck:     Thyroid: No thyromegaly.     Vascular: No carotid bruit or JVD.  Cardiovascular:     Rate and Rhythm: Normal rate and regular rhythm.     Heart sounds: Normal heart sounds. No murmur heard.    No gallop.  Pulmonary:     Effort: Pulmonary effort is normal. No respiratory distress.     Breath sounds: Normal breath sounds. No wheezing or rales.  Chest:     Chest wall: No tenderness.  Musculoskeletal:     Cervical back: Normal range of motion and neck supple.  Skin:    General: Skin is warm and dry.  Neurological:     Mental Status: She is alert and oriented to person, place, and time.  Psychiatric:        Judgment: Judgment normal.     BP 120/70 (BP Location: Left Arm, Patient Position: Sitting, Cuff Size: Normal)   Pulse (!) 58   Temp 97.8 F (36.6 C) (Oral)   Resp 18   Ht '5\' 2"'$  (1.575 m)   Wt 194  lb 12.8 oz (88.4 kg)   SpO2 96%   BMI 35.63 kg/m  Wt Readings from Last 3 Encounters:  05/20/22 194 lb 12.8 oz (88.4 kg)  05/01/22 194 lb 3.2 oz (88.1 kg)  01/21/22 204 lb 3.2 oz (92.6 kg)    Diabetic Foot Exam - Simple   No data filed    Lab Results  Component Value Date   WBC 7.3 11/14/2021   HGB 11.6 (L) 11/14/2021   HCT 35.0 (L) 11/14/2021   PLT 304.0 11/14/2021   GLUCOSE 136 (H) 11/14/2021   CHOL 141 11/14/2021   TRIG 106.0 11/14/2021   HDL 40.80 11/14/2021    LDLDIRECT 149.6 01/26/2013   LDLCALC 79 11/14/2021   ALT 12 11/14/2021   AST 14 11/14/2021   NA 137 11/14/2021   K 4.9 11/14/2021   CL 104 11/14/2021   CREATININE 1.80 (H) 11/14/2021   BUN 32 (H) 11/14/2021   CO2 26 11/14/2021   TSH 3.01 11/14/2021   INR 0.96 02/17/2017   HGBA1C 7.0 (A) 05/01/2022   MICROALBUR 0.6 03/28/2016    Lab Results  Component Value Date   TSH 3.01 11/14/2021   Lab Results  Component Value Date   WBC 7.3 11/14/2021   HGB 11.6 (L) 11/14/2021   HCT 35.0 (L) 11/14/2021   MCV 88.0 11/14/2021   PLT 304.0 11/14/2021   Lab Results  Component Value Date   NA 137 11/14/2021   K 4.9 11/14/2021   CO2 26 11/14/2021   GLUCOSE 136 (H) 11/14/2021   BUN 32 (H) 11/14/2021   CREATININE 1.80 (H) 11/14/2021   BILITOT 0.5 11/14/2021   ALKPHOS 55 11/14/2021   AST 14 11/14/2021   ALT 12 11/14/2021   PROT 6.5 11/14/2021   ALBUMIN 3.9 11/14/2021   CALCIUM 9.0 11/14/2021   ANIONGAP 13 11/25/2018   GFR 27.47 (L) 11/14/2021   Lab Results  Component Value Date   CHOL 141 11/14/2021   Lab Results  Component Value Date   HDL 40.80 11/14/2021   Lab Results  Component Value Date   LDLCALC 79 11/14/2021   Lab Results  Component Value Date   TRIG 106.0 11/14/2021   Lab Results  Component Value Date   CHOLHDL 3 11/14/2021   Lab Results  Component Value Date   HGBA1C 7.0 (A) 05/01/2022       Assessment & Plan:   Problem List Items Addressed This Visit       Unprioritized   Type 2 diabetes mellitus with stage 3b chronic kidney disease, without long-term current use of insulin ([place])   Relevant Orders   Ambulatory referral to Cardiology   Type 2 diabetes mellitus with diabetic polyneuropathy, without long-term current use of insulin ([place])    hgba1c to be checked -- , minimize simple carbs. Increase exercise as tolerated. Continue current meds       Hyperlipidemia associated with type 2 diabetes mellitus ([place])    Encourage heart healthy diet  such as MIND or DASH diet, increase exercise, avoid trans fats, simple carbohydrates and processed foods, consider a krill or fish or flaxseed oil cap daily.        Relevant Orders   CBC with Differential/Platelet   Comprehensive metabolic panel   Lipid panel   Vitamin B12   VITAMIN D 25 Hydroxy (Vit-D Deficiency, Fractures)   Ambulatory referral to Cardiology   Essential hypertension, benign    Well controlled, no changes to meds. Encouraged heart healthy diet such as the DASH diet  and exercise as tolerated.        Other Visit Diagnoses     Primary hypertension    -  Primary   Relevant Orders   CBC with Differential/Platelet   Comprehensive metabolic panel   Lipid panel   Vitamin B12   VITAMIN D 25 Hydroxy (Vit-D Deficiency, Fractures)   Ambulatory referral to Cardiology   Vitamin D deficiency       Relevant Orders   VITAMIN D 25 Hydroxy (Vit-D Deficiency, Fractures)   B12 deficiency       Relevant Orders   Vitamin B12   Chronic renal failure, stage 4 (severe) (H[facility])       Family history of early CAD       Relevant Orders   Ambulatory referral to Cardiology        No orders of the defined types were placed in this encounter.   IAnn Held, DO, personally preformed the services described in this documentation.  All medical record entries made by the scribe were at my direction and in my presence.  I have reviewed the chart and discharge instructions (if applicable) and agree that the record reflects my personal performance and is accurate and complete. 05/20/2022   I,Shehryar Baig,acting as a scribe for Ann Held, DO.,have documented all relevant documentation on the behalf of Ann Held, DO,as directed by  Ann Held, DO while in the presence of Ann Held, DO.   Ann Held, DO

## 2022-05-20 NOTE — Assessment & Plan Note (Signed)
Well controlled, no changes to meds. Encouraged heart healthy diet such as the DASH diet and exercise as tolerated.  °

## 2022-05-20 NOTE — Patient Instructions (Signed)

## 2022-05-20 NOTE — Assessment & Plan Note (Signed)
Encourage heart healthy diet such as MIND or DASH diet, increase exercise, avoid trans fats, simple carbohydrates and processed foods, consider a krill or fish or flaxseed oil cap daily.  °

## 2022-05-20 NOTE — Assessment & Plan Note (Signed)
hgba1c to be checked, minimize simple carbs. Increase exercise as tolerated. Continue current meds  

## 2022-05-30 ENCOUNTER — Telehealth: Payer: Self-pay | Admitting: Family Medicine

## 2022-05-30 NOTE — Telephone Encounter (Signed)
Left message for patient to call back and schedule Medicare Annual Wellness Visit (AWV) either virtually or phone  . Left  my Herbie Drape number [phone]   Last AWV 05/16/21 please schedule with Nurse Health Adviser 2   45 min for awv-i and in office appointments 30 min for awv-s  phone/virtual appointments

## 2022-06-10 ENCOUNTER — Encounter: Payer: Self-pay | Admitting: Family Medicine

## 2022-06-10 DIAGNOSIS — N1832 Chronic kidney disease, stage 3b: Secondary | ICD-10-CM | POA: Diagnosis not present

## 2022-06-10 DIAGNOSIS — I129 Hypertensive chronic kidney disease with stage 1 through stage 4 chronic kidney disease, or unspecified chronic kidney disease: Secondary | ICD-10-CM | POA: Diagnosis not present

## 2022-06-10 DIAGNOSIS — E1122 Type 2 diabetes mellitus with diabetic chronic kidney disease: Secondary | ICD-10-CM | POA: Diagnosis not present

## 2022-06-10 DIAGNOSIS — E785 Hyperlipidemia, unspecified: Secondary | ICD-10-CM | POA: Diagnosis not present

## 2022-06-25 NOTE — Progress Notes (Unsigned)
Cardiology Office Note:    Date:  06/26/2022   ID:  Bethany Miller, DOB 1946-01-24, MRN [id number]  PCP:  Carollee Herter, [place] Providers Cardiologist:  None     Referring MD: Carollee Herter, Alferd Apa, *   CC: FHX of CAD Consulted for the evaluation of Family history of CAD at the behest of Dr. Carollee Herter  History of Present Illness:    Bethany Miller is a 76 y.o. female with a hx of HTN, HLD with DM, and family history of heart disease.  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  She would like to get checked out.  Patient notes that she is feeling well.   When she has a bit of fatigue and DOE in certain situations.  Has had no chest pain, chest pressure, chest tightness, chest stinging.She gets arm pain and numbness for years that is independent of activity.   No DOE when going for a walk or with her grandchildren.  Walked 1.5 miles for a Christmas Parade.  DOE on when [employer] and yard work.  No shortness of breath at rest.  No PND or orthopnea.  No weight gain, leg swelling , or abdominal swelling.  No syncope or near syncope . Notes  no palpitations or funny heart beats.     Patient reports prior cardiac testing including distant stress test (negative) and echo that was WNL.   Past Medical History:  Diagnosis Date   Diabetes mellitus    Hyperlipidemia    Hypertension    Murmur     Past Surgical History:  Procedure Laterality Date   ABDOMINAL HYSTERECTOMY     BACK SURGERY     CHOLECYSTECTOMY     TONSILLECTOMY     TOTAL KNEE ARTHROPLASTY     TOTAL KNEE ARTHROPLASTY Right 02/26/2017   Procedure: RIGHT TOTAL KNEE ARTHROPLASTY;  Surgeon: Susa Day, MD;  Location: [facility];  Service: Orthopedics;  Laterality: Right;    Current Medications: Current Meds  Medication Sig   acetaminophen (TYLENOL) 650 MG CR tablet Take 650 mg by mouth every 8 (eight) hours as needed for pain.   Ascorbic Acid (VITAMIN C) 100  MG tablet Take 100 mg by mouth daily.   aspirin EC 81 MG tablet Take 81 mg by mouth daily.   cholecalciferol (VITAMIN D3) 25 MCG (1000 UNIT) tablet Take 2,000 Units by mouth daily.   citalopram (CELEXA) 40 MG tablet TAKE 1 TABLET EVERY DAY   docusate sodium (COLACE) 100 MG capsule Take 1 capsule (100 mg total) by mouth 2 (two) times daily as needed for mild constipation.   Dulaglutide (TRULICITY) 1.5 NG/2.9BM SOPN Inject 1.5 mg into the skin once a week.   fenofibrate 160 MG tablet Take 1 tablet (160 mg total) by mouth daily.   fluticasone (FLONASE) 50 MCG/ACT nasal spray Place 2 sprays into both nostrils daily.   glucose blood (ACCU-CHEK GUIDE) test strip USE 1 STRIP TO CHECK GLUCOSE ONCE DAILY   Insulin Admin Supplies MISC Use to inject insulin 2 times a day.   insulin glargine (LANTUS SOLOSTAR) 100 UNIT/ML Solostar Pen Inject 10-15 Units into the skin daily.   Insulin Pen Needle 32G X 4 MM MISC Use 1x a day   Lancets (ACCU-CHEK MULTICLIX) lancets Use as instructed to test once a day DX E11.51   lisinopril-hydrochlorothiazide (ZESTORETIC) 10-12.5 MG tablet TAKE 1 TABLET DAILY   Loratadine-Pseudoephedrine (CLARITIN-D 12  HOUR PO) Take 1 tablet by mouth daily as needed (sinus).    meloxicam (MOBIC) 7.5 MG tablet TAKE 1 TABLET DAILY   metFORMIN (GLUCOPHAGE-XR) 500 MG 24 hr tablet Take 1 tablet (500 mg total) by mouth 2 (two) times daily.   Multiple Vitamins-Minerals (MULTIVITAMIN ADULT PO) Take 1 tablet by mouth daily.    omeprazole (PRILOSEC) 20 MG capsule Take 1 capsule (20 mg total) by mouth daily.   traZODone (DESYREL) 50 MG tablet TAKE 1/2 TO 1 TABLET BY MOUTH AT BEDTIME AS NEEDED FOR SLEEP   vitamin B-12 (CYANOCOBALAMIN) 100 MCG tablet Take 100 mcg by mouth daily.   zinc gluconate 50 MG tablet Take 50 mg by mouth daily.   [DISCONTINUED] amoxicillin (AMOXIL) 500 MG capsule 4 po 1 hr prior to procedure   [DISCONTINUED] atorvastatin (LIPITOR) 40 MG tablet TAKE 1 TABLET DAILY   [DISCONTINUED]  atorvastatin (LIPITOR) 80 MG tablet Take 1 tablet (80 mg total) by mouth daily.   [DISCONTINUED] LORazepam (ATIVAN) 1 MG tablet Take 1 tablet (1 mg total) by mouth once for 1 dose.     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   Caffeine use: coffee in the am   Social Determinants of Health   Financial Resource Strain: Low Risk  (05/16/2021)   Overall Financial Resource Strain (CARDIA)    Difficulty of Paying Living Expenses: Not hard at all  Food Insecurity: No Food Insecurity (05/16/2021)   Hunger Vital Sign    Worried About Running Out of Food in the Last Year: Never true    [place] in the Last Year: Never true  Transportation Needs: No Transportation Needs (05/16/2021)   PRAPARE - Hydrologist (Medical): No    Lack of Transportation (Non-Medical): No  Physical Activity: Inactive (05/16/2021)   Exercise Vital Sign    Days of Exercise per Week: 0 days    Minutes of Exercise per Session: 0 min  Stress: No Stress Concern Present (05/16/2021)   [place]    Feeling of Stress : Only a little  Social Connections: Moderately Isolated (05/16/2021)   Social Connection and Isolation Panel [NHANES]    Frequency of Communication with Friends and Family: More than three times a week    Frequency of Social  Gatherings with Friends and Family: More than three times a week    Attends Religious Services: Never    Marine scientist or Organizations: Yes    Attends Music therapist: More than 4 times per year    Marital Status: Widowed    Social: used to see Dr. Lia Foyer and loved seeing him  Family History: The patient's family history includes Alcohol abuse in her father; Alzheimer's  disease in her sister; Arthritis in her mother, sister, and another family member; Cancer in her brother and brother; Cancer (age of onset: 52) in her brother; Cancer (age of onset: 37) in her brother; Cirrhosis in her father; Heart disease in her brother and brother; Hyperlipidemia in her brother and brother; Hypertension in her brother and sister; Melanoma in an other family member; Throat cancer in her brother.  ROS:   Please see the history of present illness.     All other systems reviewed and are negative.  EKGs/Labs/Other Studies Reviewed:    The following studies were reviewed today:  EKG:  EKG is  ordered today.  The ekg ordered today demonstrates  06/26/22: Sinus bradycardia rate 52  Cardiac Studies & Procedures       ECHOCARDIOGRAM  ECHOCARDIOGRAM COMPLETE 05/15/2017  Narrative *[facility]* [place], [place] [address] [phone]  ------------------------------------------------------------------- Transthoracic Echocardiography  Patient:    Makira, Holleman MR #:       [id number] Study Date: 05/15/2017 Gender:     F Age:        89 Height:     157.5 cm Weight:     90.3 kg BSA:        2.03 m^2 Pt. Status: Room:  [place], [place] ATTENDING    [place], [place] R ORDERING     [place], [place] R REFERRING    [place], Kendrick Fries R SONOGRAPHER  Cardell Peach, RDCS  cc:  ------------------------------------------------------------------- LV EF: 60% -    65%  ------------------------------------------------------------------- History:   PMH:  Shortness of breath  Murmur.  Risk factors:  Hypertension. Diabetes mellitus. Dyslipidemia.  ------------------------------------------------------------------- Study Conclusions  - Left ventricle: The cavity size was normal. Systolic function was normal. The estimated ejection fraction was in the range of 60% to 65%. Wall motion was normal; there were no regional wall motion abnormalities.  Impressions:  - Normal systolic function, Impaired relaxation. Unremarkable study.  ------------------------------------------------------------------- Study data:  No prior study was available for comparison.  Study status:  Routine.  Procedure:  Transthoracic echocardiography. Image quality was adequate.  Study completion:  There were no complications.          Transthoracic echocardiography.  M-mode, complete 2D, spectral Doppler, and color Doppler.  Birthdate: Patient birthdate: 20-Feb-1946.  Age:  Patient is 76 yr old.  Sex: Gender: female.    BMI: 36.4 kg/m^2.  Blood pressure:     113/69 Patient status:  Outpatient.  Study date:  Study date: 05/15/2017. Study time: 01:24 PM.  Location:  Bedside.  -------------------------------------------------------------------  ------------------------------------------------------------------- Left ventricle:  Impaired left ventricular relaxation The cavity size was normal. Systolic function was normal. The estimated ejection fraction was in the range of 60% to 65%. Wall motion was normal; there were no regional wall motion abnormalities.  ------------------------------------------------------------------- Aortic valve:   Trileaflet; normal thickness leaflets. Mobility was not restricted.  Doppler:  Transvalvular velocity was within the normal range. There was no stenosis. There was no  regurgitation.  ------------------------------------------------------------------- Aorta:  Aortic root: The aortic root was normal in size.  ------------------------------------------------------------------- Mitral valve:   Structurally normal valve.   Mobility was not restricted.  Doppler:  Transvalvular velocity was within the normal range. There was no evidence for stenosis. There was no regurgitation.    Peak gradient (D): 2 mm Hg.  ------------------------------------------------------------------- Left atrium:  The atrium was normal in size.  ------------------------------------------------------------------- Right ventricle:  The cavity size was normal.  Wall thickness was normal. Systolic function was normal.  ------------------------------------------------------------------- Pulmonic valve:    No significant valve disease.    Doppler: Transvalvular velocity was within the normal range. There was no evidence for stenosis.  ------------------------------------------------------------------- Tricuspid valve:   Structurally normal valve.    Doppler: Transvalvular velocity was within the normal range. There was no regurgitation.  ------------------------------------------------------------------- Pulmonary artery:   The main pulmonary artery was normal-sized. Systolic pressure was within the normal range.  ------------------------------------------------------------------- Right atrium:  The atrium was normal in size.  ------------------------------------------------------------------- Pericardium:  There was no pericardial effusion.  ------------------------------------------------------------------- Systemic veins: Inferior vena cava: The vessel was normal in size.  ------------------------------------------------------------------- Measurements  Left ventricle                          Value        Reference LV ID, ED, PLAX chordal         (L)     40.9  mm     43  - 52 LV ID, ES, PLAX chordal         (L)     21.5  mm     23 - 38 LV fx shortening, PLAX chordal          47    %      >=29 LV PW thickness, ED                     11.8  mm     ---------- IVS/LV PW ratio, ED                     0.71         <=1.3 Stroke volume, 2D                       69    ml     ---------- Stroke volume/bsa, 2D                   34    ml/m^2 ---------- LV e&', lateral                          10.7  cm/s   ---------- LV E/e&', lateral                        7.38         ---------- LV e&', medial                           6.64  cm/s   ---------- LV E/e&', medial                         11.9         ---------- LV e&', average                          8.67  cm/s   ---------- LV E/e&', average                        9.11         ----------  Ventricular septum                      Value  Reference IVS thickness, ED                       8.41  mm     ----------  LVOT                                    Value        Reference LVOT ID, S                              17    mm     ---------- LVOT area                               2.27  cm^2   ---------- LVOT peak velocity, S                   139   cm/s   ---------- LVOT mean velocity, S                   97.7  cm/s   ---------- LVOT VTI, S                             30.3  cm     ---------- LVOT peak gradient, S                   8     mm Hg  ----------  Aorta                                   Value        Reference Aortic root ID, ED                      21    mm     ----------  Left atrium                             Value        Reference LA ID, A-P, ES                          36    mm     ---------- LA ID/bsa, A-P                          1.77  cm/m^2 <=2.2 LA volume, S                            35.2  ml     ---------- LA volume/bsa, S                        17.3  ml/m^2 ---------- LA volume, ES, 1-p A4C                  31.3  ml     ---------- LA volume/bsa, ES, 1-p A4C              15.4  ml/m^2 ---------- LA  volume, ES, 1-p A2C                  38.3  ml     ---------- LA volume/bsa, ES, 1-p A2C              18.9  ml/m^2 ----------  Mitral valve                            Value        Reference Mitral E-wave peak velocity             79    cm/s   ---------- Mitral A-wave peak velocity             98.2  cm/s   ---------- Mitral deceleration time        (H)     285   ms     150 - 230 Mitral peak gradient, D                 2     mm Hg  ---------- Mitral E/A ratio, peak                  0.8          ----------  Right atrium                            Value        Reference RA ID, S-I, ES, A4C                     46.2  mm     34 - 49 RA area, ES, A4C                        12    cm^2   8.3 - 19.5 RA volume, ES, A/L                      26.4  ml     ---------- RA volume/bsa, ES, A/L                  13    ml/m^2 ----------  Systemic veins                          Value        Reference Estimated CVP                           3     mm Hg  ----------  Right ventricle                         Value        Reference RV ID, minor axis, ED, A4C base         32.6  mm     ---------- TAPSE                                   19.5  mm     ---------- RV s&', lateral, S                       16.7  cm/s   ----------  Legend: (L)  and  (H)  mark values outside specified reference range.  ------------------------------------------------------------------- Prepared and Electronically Authenticated by  Jyl Heinz, MD 2018-11-02T15:42:25              Recent Labs: 11/14/2021: TSH 3.01 05/20/2022: ALT 12; BUN 21; Creatinine, Ser 1.16; Hemoglobin 12.2; Platelets 242.0; Potassium 4.7; Sodium 137  Recent Lipid Panel    Component Value Date/Time   CHOL 149 05/20/2022 1047   TRIG 123.0 05/20/2022 1047   TRIG 172 (H) 06/23/2006 0942   HDL 40.30 05/20/2022 1047   CHOLHDL 4 05/20/2022 1047   VLDL 24.6 05/20/2022 1047   LDLCALC 84 05/20/2022 1047   LDLCALC 87 03/08/2021 1549   LDLDIRECT 149.6 01/26/2013  1420    Physical Exam:    VS:  BP 130/64   Pulse (!) 52   Ht '5\' 2"'$  (1.575 m)   Wt 195 lb (88.5 kg)   SpO2 95%   BMI 35.67 kg/m     Wt Readings from Last 3 Encounters:  06/26/22 195 lb (88.5 kg)  05/20/22 194 lb 12.8 oz (88.4 kg)  05/01/22 194 lb 3.2 oz (88.1 kg)    GEN:  Well nourished, well developed in no acute distress HEENT: Normal NECK: No JVD CARDIAC: regular bradycardia with systolic murmur,  no rubs, gallops RESPIRATORY:  Clear to auscultation without rales, wheezing or rhonchi  ABDOMEN: Soft, non-tender, non-distended MUSCULOSKELETAL:  No edema; No deformity  SKIN: Warm and dry NEUROLOGIC:  Alert and oriented x 3 PSYCHIATRIC:  Normal affect   ASSESSMENT:    1. DOE (dyspnea on exertion)   2. Essential hypertension, benign   3. MURMUR   4. Coronary artery calcification   5. Aortic atherosclerosis ([place])   6. Mixed hyperlipidemia    PLAN:    DOE Family history of CAD - PET study - (NPO at midnight/hold beta blocker in AM); discussed risks, benefits, and alternatives of the diagnostic procedure including chest pain, arrhythmia, and death.  Patient amenable for testing.  CAC Aortic atherosclerosis HLD with DM - reviewed 2018 CT with patient - will increase to atorvastatin 80 mg and labs in three months; discussed SE risks  HTN - no change in therapy  Aortic sclerosis Heart murmur - monitor      Six months with me   Medication Adjustments/Labs and Tests Ordered: Current medicines are reviewed at length with the patient today.  Concerns regarding medicines are outlined above.  Orders Placed This Encounter  Procedures   NM PET CT CARDIAC PERFUSION MULTI W/ABSOLUTE BLOODFLOW   ALT   Lipid panel   Cardiac Stress Test: Informed Consent Details: Physician/Practitioner Attestation; Transcribe to consent form and obtain patient signature   EKG 12-Lead   Meds ordered this encounter  Medications   DISCONTD: atorvastatin (LIPITOR) 80 MG tablet     Sig: Take 1 tablet (80 mg total) by mouth daily.    Dispense:  90 tablet    Refill:  3   atorvastatin (LIPITOR) 80 MG tablet    Sig: Take 1 tablet (80 mg total) by mouth daily.    Dispense:  90 tablet    Refill:  3   DISCONTD: LORazepam (ATIVAN) 1 MG tablet    Sig: Take 1 tablet (1 mg total) by mouth once for 1 dose.    Dispense:  1 tablet    Refill:  0    Patient Instructions  Medication Instructions:  Your physician has recommended you make the following change in your medication:  Increase atorvastatin to '80mg'$  daily  *If you need a refill on your cardiac medications before your next appointment, please call your pharmacy*   Lab Work: Lipids, ALT 3 months If you have labs (blood work) drawn today and your tests are completely normal, you will receive your results only by: [place] (if you have MyChart) OR A paper copy in the mail If you have any lab test that is abnormal or we need to change your treatment, we will call you to review the results.   Testing/Procedures: Cardiac Pet scan    Follow-Up: At [facility], you and your health needs are our priority.  As part of our continuing mission to provide you with exceptional heart care, we have created designated Provider Care Teams.  These Care Teams include your primary Cardiologist (physician) and Advanced Practice Providers (APPs -  Physician Assistants and Nurse Practitioners) who all work together to provide you with the care you need, when you need it.  We recommend signing up for the patient portal called "MyChart".  Sign up information is provided on this After Visit Summary.  MyChart is used to connect with patients for Virtual Visits (Telemedicine).  Patients are able to view lab/test results, encounter notes, upcoming appointments, etc.  Non-urgent messages can be sent to your provider as well.   To learn more about what you can do with MyChart, go to [email or web address].    Your next  appointment:   6 month(s)  The format for your next appointment:   In Person  Provider:   Dr. Gasper Sells   Other Instructions How to Prepare for Your Cardiac PET/CT Stress Test:  1. Please do not take these medications before your test:   Medications that may interfere with the cardiac pharmacological stress agent (ex. nitrates - including erectile dysfunction medications, isosorbide mononitrate or beta-blockers) the day of the exam. (Erectile dysfunction medication should be held for at least 72 hrs prior to test) Theophylline containing medications for 12 hours. Dipyridamole 48 hours prior to the test. Your remaining medications may be taken with water.  2. Nothing to eat or drink, except water, 3 hours prior to arrival time.   NO caffeine/decaffeinated products, or chocolate 12 hours prior to arrival.  3. NO perfume, cologne or lotion  4. Total time is 1 to 2 hours; you may want to bring reading material for the waiting time.  5. Please report to Admitting at the [facility] Main Entrance 30 minutes early for your test.  [address], [place] [address]  Diabetic Preparation:  Hold oral medications. You may take NPH and Lantus insulin. Do not take Humalog or Humulin R (Regular Insulin) the day of your test. Check blood sugars prior to leaving the house. If able to eat breakfast prior to 3 hour fasting, you may take all medications, including your insulin, Do not worry if you miss your breakfast dose of insulin - start at your next meal.  IF YOU THINK YOU MAY BE PREGNANT, OR ARE NURSING PLEASE INFORM THE TECHNOLOGIST.  In preparation for your appointment, medication and supplies will be purchased.  Appointment availability is limited, so if you need to cancel or reschedule, please call the Radiology Department at [phone]  24 hours in advance to avoid a cancellation fee of $100.00  What to Expect After you Arrive:  Once you arrive and check in  for your appointment, you will be taken to a preparation room within the Radiology Department.  A technologist or Nurse will obtain your medical history, verify that you are correctly prepped for the exam, and explain the procedure.  Afterwards,  an IV will be started in your arm and electrodes will be placed on your skin for EKG monitoring during the stress portion of the exam. Then you will be escorted to the PET/CT scanner.  There, staff will get you positioned on the scanner and obtain a blood pressure and EKG.  During the exam, you will continue to be connected to the EKG and blood pressure machines.  A small, safe amount of a radioactive tracer will be injected in your IV to obtain a series of pictures of your heart along with an injection of a stress agent.    After your Exam:  It is recommended that you eat a meal and drink a caffeinated beverage to counter act any effects of the stress agent.  Drink plenty of fluids for the remainder of the day and urinate frequently for the first couple of hours after the exam.  Your doctor will inform you of your test results within 7-10 business days.  For questions about your test or how to prepare for your test, please call: Marchia Bond, Cardiac Imaging Nurse Navigator  Gordy Clement, Cardiac Imaging Nurse Navigator Office: [phone]               Signed, Werner Lean, MD  06/26/2022 11:02 AM    [place]

## 2022-06-26 ENCOUNTER — Ambulatory Visit: Payer: Medicare Other | Attending: Internal Medicine | Admitting: Internal Medicine

## 2022-06-26 ENCOUNTER — Ambulatory Visit: Payer: Medicare Other | Admitting: Internal Medicine

## 2022-06-26 ENCOUNTER — Encounter: Payer: Self-pay | Admitting: Internal Medicine

## 2022-06-26 VITALS — BP 130/64 | HR 52 | Ht 62.0 in | Wt 195.0 lb

## 2022-06-26 DIAGNOSIS — I2584 Coronary atherosclerosis due to calcified coronary lesion: Secondary | ICD-10-CM

## 2022-06-26 DIAGNOSIS — I700 Atherosclerosis of aorta: Secondary | ICD-10-CM | POA: Insufficient documentation

## 2022-06-26 DIAGNOSIS — I2510 Atherosclerotic heart disease of native coronary artery without angina pectoris: Secondary | ICD-10-CM | POA: Insufficient documentation

## 2022-06-26 DIAGNOSIS — R011 Cardiac murmur, unspecified: Secondary | ICD-10-CM | POA: Diagnosis not present

## 2022-06-26 DIAGNOSIS — R0609 Other forms of dyspnea: Secondary | ICD-10-CM | POA: Insufficient documentation

## 2022-06-26 DIAGNOSIS — I10 Essential (primary) hypertension: Secondary | ICD-10-CM

## 2022-06-26 DIAGNOSIS — E782 Mixed hyperlipidemia: Secondary | ICD-10-CM

## 2022-06-26 MED ORDER — ATORVASTATIN CALCIUM 80 MG PO TABS: 80.0000 mg | ORAL_TABLET | Freq: Every day | ORAL | 3 refills | Status: DC

## 2022-06-26 MED ORDER — LORAZEPAM 1 MG PO TABS: 1.0000 mg | ORAL_TABLET | Freq: Once | ORAL | 0 refills | Status: DC

## 2022-06-26 NOTE — Patient Instructions (Addendum)
Medication Instructions:  Your physician has recommended you make the following change in your medication:   Increase atorvastatin to '80mg'$  daily  *If you need a refill on your cardiac medications before your next appointment, please call your pharmacy*   Lab Work: Lipids, ALT 3 months If you have labs (blood work) drawn today and your tests are completely normal, you will receive your results only by: [place] (if you have MyChart) OR A paper copy in the mail If you have any lab test that is abnormal or we need to change your treatment, we will call you to review the results.   Testing/Procedures: Cardiac Pet scan    Follow-Up: At [facility], you and your health needs are our priority.  As part of our continuing mission to provide you with exceptional heart care, we have created designated Provider Care Teams.  These Care Teams include your primary Cardiologist (physician) and Advanced Practice Providers (APPs -  Physician Assistants and Nurse Practitioners) who all work together to provide you with the care you need, when you need it.  We recommend signing up for the patient portal called "MyChart".  Sign up information is provided on this After Visit Summary.  MyChart is used to connect with patients for Virtual Visits (Telemedicine).  Patients are able to view lab/test results, encounter notes, upcoming appointments, etc.  Non-urgent messages can be sent to your provider as well.   To learn more about what you can do with MyChart, go to [email or web address].    Your next appointment:   6 month(s)  The format for your next appointment:   In Person  Provider:   Dr. Gasper Sells   Other Instructions How to Prepare for Your Cardiac PET/CT Stress Test:  1. Please do not take these medications before your test:   Medications that may interfere with the cardiac pharmacological stress agent (ex. nitrates - including erectile dysfunction medications, isosorbide  mononitrate or beta-blockers) the day of the exam. (Erectile dysfunction medication should be held for at least 72 hrs prior to test) Theophylline containing medications for 12 hours. Dipyridamole 48 hours prior to the test. Your remaining medications may be taken with water.  2. Nothing to eat or drink, except water, 3 hours prior to arrival time.   NO caffeine/decaffeinated products, or chocolate 12 hours prior to arrival.  3. NO perfume, cologne or lotion  4. Total time is 1 to 2 hours; you may want to bring reading material for the waiting time.  5. Please report to Admitting at the [facility] Main Entrance 30 minutes early for your test.  [place], [place] [address]  Diabetic Preparation:  Hold oral medications. You may take NPH and Lantus insulin. Do not take Humalog or Humulin R (Regular Insulin) the day of your test. Check blood sugars prior to leaving the house. If able to eat breakfast prior to 3 hour fasting, you may take all medications, including your insulin, Do not worry if you miss your breakfast dose of insulin - start at your next meal.  IF YOU THINK YOU MAY BE PREGNANT, OR ARE NURSING PLEASE INFORM THE TECHNOLOGIST.  In preparation for your appointment, medication and supplies will be purchased.  Appointment availability is limited, so if you need to cancel or reschedule, please call the Radiology Department at [phone]  24 hours in advance to avoid a cancellation fee of $100.00  What to Expect After you Arrive:  Once you arrive and check in  for your appointment, you will be taken to a preparation room within the Radiology Department.  A technologist or Nurse will obtain your medical history, verify that you are correctly prepped for the exam, and explain the procedure.  Afterwards,  an IV will be started in your arm and electrodes will be placed on your skin for EKG monitoring during the stress portion of the exam. Then you will be  escorted to the PET/CT scanner.  There, staff will get you positioned on the scanner and obtain a blood pressure and EKG.  During the exam, you will continue to be connected to the EKG and blood pressure machines.  A small, safe amount of a radioactive tracer will be injected in your IV to obtain a series of pictures of your heart along with an injection of a stress agent.    After your Exam:  It is recommended that you eat a meal and drink a caffeinated beverage to counter act any effects of the stress agent.  Drink plenty of fluids for the remainder of the day and urinate frequently for the first couple of hours after the exam.  Your doctor will inform you of your test results within 7-10 business days.  For questions about your test or how to prepare for your test, please call: Marchia Bond, Cardiac Imaging Nurse Navigator  Gordy Clement, Cardiac Imaging Nurse Navigator Office: [phone]

## 2022-07-11 ENCOUNTER — Encounter: Payer: Self-pay | Admitting: Internal Medicine

## 2022-07-11 ENCOUNTER — Encounter: Payer: Self-pay | Admitting: Family Medicine

## 2022-07-29 ENCOUNTER — Other Ambulatory Visit: Payer: Self-pay | Admitting: Family Medicine

## 2022-07-29 DIAGNOSIS — G4700 Insomnia, unspecified: Secondary | ICD-10-CM

## 2022-08-29 ENCOUNTER — Encounter (HOSPITAL_COMMUNITY): Payer: Self-pay | Admitting: Internal Medicine

## 2022-09-03 ENCOUNTER — Ambulatory Visit (INDEPENDENT_AMBULATORY_CARE_PROVIDER_SITE_OTHER): Payer: Medicare Other | Admitting: *Deleted

## 2022-09-03 DIAGNOSIS — Z0000 Encounter for general adult medical examination without abnormal findings: Secondary | ICD-10-CM

## 2022-09-03 NOTE — Progress Notes (Signed)
Subjective:  Pt completed ADLs, Fall risk, & SDOH during e-check in on 09/02/22.  Answers verified with pt.    Bethany Miller is a 77 y.o. female who presents for Medicare Annual (Subsequent) preventive examination.  I connected with  Bethany Miller on 09/03/22 by a audio enabled telemedicine application and verified that I am speaking with the correct person using two identifiers.  Patient Location: Home  Provider Location: Office/Clinic  I discussed the limitations of evaluation and management by telemedicine. The patient expressed understanding and agreed to proceed.   Review of Systems    Defer to PCP Cardiac Risk Factors include: advanced age (>75mn, >>69women);diabetes mellitus;dyslipidemia;hypertension;obesity (BMI >30kg/m2)     Objective:    There were no vitals filed for this visit. There is no height or weight on file to calculate BMI.     09/03/2022    9:03 AM 05/16/2021    9:04 AM 11/09/2020    9:23 AM 04/07/2019    8:55 AM 11/25/2018    5:17 PM 10/13/2017   11:30 AM 04/21/2017    9:40 PM  Advanced Directives  Does Patient Have a Medical Advance Directive? No No Yes No No No No  Would patient like information on creating a medical advance directive? No - Patient declined Yes (MAU/Ambulatory/Procedural Areas - Information given)  Yes (MAU/Ambulatory/Procedural Areas - Information given)  Yes (MAU/Ambulatory/Procedural Areas - Information given)     Current Medications (verified) Outpatient Encounter Medications as of 09/03/2022  Medication Sig   traZODone (DESYREL) 50 MG tablet TAKE 1/2 TO 1 TABLET BY MOUTH AT BEDTIME AS NEEDED FOR SLEEP   acetaminophen (TYLENOL) 650 MG CR tablet Take 650 mg by mouth every 8 (eight) hours as needed for pain.   Ascorbic Acid (VITAMIN C) 100 MG tablet Take 100 mg by mouth daily.   aspirin EC 81 MG tablet Take 81 mg by mouth daily.   atorvastatin (LIPITOR) 80 MG tablet Take 1 tablet (80 mg total) by mouth daily.   cholecalciferol  (VITAMIN D3) 25 MCG (1000 UNIT) tablet Take 2,000 Units by mouth daily.   citalopram (CELEXA) 40 MG tablet TAKE 1 TABLET EVERY DAY   docusate sodium (COLACE) 100 MG capsule Take 1 capsule (100 mg total) by mouth 2 (two) times daily as needed for mild constipation.   Dulaglutide (TRULICITY) 1.5 M0000000SOPN Inject 1.5 mg into the skin once a week.   fluticasone (FLONASE) 50 MCG/ACT nasal spray Place 2 sprays into both nostrils daily.   glucose blood (ACCU-CHEK GUIDE) test strip USE 1 STRIP TO CHECK GLUCOSE ONCE DAILY   Insulin Admin Supplies MISC Use to inject insulin 2 times a day.   insulin glargine (LANTUS SOLOSTAR) 100 UNIT/ML Solostar Pen Inject 10-15 Units into the skin daily.   Insulin Pen Needle 32G X 4 MM MISC Use 1x a day   Lancets (ACCU-CHEK MULTICLIX) lancets Use as instructed to test once a day DX E11.51   lisinopril-hydrochlorothiazide (ZESTORETIC) 10-12.5 MG tablet TAKE 1 TABLET DAILY   Loratadine-Pseudoephedrine (CLARITIN-D 12 HOUR PO) Take 1 tablet by mouth daily as needed (sinus).    meloxicam (MOBIC) 7.5 MG tablet TAKE 1 TABLET DAILY   metFORMIN (GLUCOPHAGE-XR) 500 MG 24 hr tablet Take 1 tablet (500 mg total) by mouth 2 (two) times daily.   Multiple Vitamins-Minerals (MULTIVITAMIN ADULT PO) Take 1 tablet by mouth daily.    omeprazole (PRILOSEC) 20 MG capsule Take 1 capsule (20 mg total) by mouth daily.   vitamin B-12 (  CYANOCOBALAMIN) 100 MCG tablet Take 100 mcg by mouth daily.   zinc gluconate 50 MG tablet Take 50 mg by mouth daily.   [DISCONTINUED] fenofibrate 160 MG tablet Take 1 tablet (160 mg total) by mouth daily.   No facility-administered encounter medications on file as of 09/03/2022.    Allergies (verified) Ozempic (0.25 or 0.5 mg-dose) [semaglutide(0.25 or 0.29m-dos)]   History: Past Medical History:  Diagnosis Date   Diabetes mellitus    Hyperlipidemia    Hypertension    Murmur    Past Surgical History:  Procedure Laterality Date   ABDOMINAL  HYSTERECTOMY     BACK SURGERY     CHOLECYSTECTOMY     TONSILLECTOMY     TOTAL KNEE ARTHROPLASTY     TOTAL KNEE ARTHROPLASTY Right 02/26/2017   Procedure: RIGHT TOTAL KNEE ARTHROPLASTY;  Surgeon: BSusa Day MD;  Location: [facility];  Service: Orthopedics;  Laterality: Right;   Family History  Problem Relation Age of Onset   Throat cancer Brother    Heart disease Brother        MI   Cancer Brother 865      throat,    Hyperlipidemia Brother    Cirrhosis Father    Alcohol abuse Father    Heart disease Brother    Cancer Brother    Arthritis Mother    Hypertension Sister    Arthritis Sister    Alzheimer's disease Sister    Cancer Brother    Cancer Brother 512      melanoma   Hypertension Brother    Hyperlipidemia Brother    Melanoma Other    Arthritis Other    Social History   Socioeconomic History   Marital status: Widowed    Spouse name: Not on file   Number of children: Not on file   Years of education: Not on file   Highest education level: Not on file  Occupational History   Not on file  Tobacco Use   Smoking status: Never   Smokeless tobacco: Never  Substance and Sexual Activity   Alcohol use: Yes    Comment: rare   Drug use: No   Sexual activity: Not Currently    Partners: Male  Other Topics Concern   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   Caffeine use: coffee in the am   Social Determinants of Health   Financial Resource Strain: Low Risk  (09/02/2022)   Overall Financial Resource Strain (CARDIA)    Difficulty of Paying Living Expenses: Not very hard  Food Insecurity: No Food Insecurity (09/02/2022)   Hunger Vital Sign    Worried About Running Out of Food in the Last Year: Never true    Ran Out of Food in the Last Year: Never true  Transportation Needs: No Transportation Needs (09/02/2022)   PRAPARE - THydrologist(Medical): No    Lack of Transportation (Non-Medical): No   Physical Activity: Insufficiently Active (09/02/2022)   Exercise Vital Sign    Days of Exercise per Week: 2 days    Minutes of Exercise per Session: 30 min  Stress: No Stress Concern Present (09/02/2022)   [place]   Feeling of Stress : Only a little  Social Connections: Moderately Integrated (09/02/2022)   Social Connection and Isolation Panel [NHANES]    Frequency of Communication with Friends and Family: More than three times  a week    Frequency of Social Gatherings with Friends and Family: More than three times a week    Attends Religious Services: More than 4 times per year    Active Member of Clubs or Organizations: Yes    Attends Archivist Meetings: More than 4 times per year    Marital Status: Widowed    Tobacco Counseling Counseling given: Not Answered   Clinical Intake:  Pre-visit preparation completed: Yes  Pain : No/denies pain  Nutritional Risks: None Diabetes: Yes CBG done?: No Did pt. bring in CBG monitor from home?: No  How often do you need to have someone help you when you read instructions, pamphlets, or other written materials from your doctor or pharmacy?: 1 - Never   Activities of Daily Living    09/02/2022    5:54 PM  In your present state of health, do you have any difficulty performing the following activities:  Hearing? 0  Vision? 1  Difficulty concentrating or making decisions? 0  Walking or climbing stairs? 0  Dressing or bathing? 0  Doing errands, shopping? 0  Preparing Food and eating ? N  Using the Toilet? N  In the past six months, have you accidently leaked urine? Y  Do you have problems with loss of bowel control? N  Managing your Medications? N  Managing your Finances? Y  Housekeeping or managing your Housekeeping? N    Patient Care Team: Carollee Herter, Alferd Apa, DO as PCP - General Philemon Kingdom, MD as Consulting Physician (Internal  Medicine) Roseanne Kaufman, MD as Consulting Physician (Orthopedic Surgery) Susa Day, MD as Consulting Physician (Orthopedic Surgery)  Indicate any recent Medical Services you may have received from other than [facility] providers in the past year (date may be approximate).     Assessment:   This is a routine wellness examination for [place].  Hearing/Vision screen No results found.  Dietary issues and exercise activities discussed: Current Exercise Habits: Home exercise routine, Type of exercise: Other - see comments (uses stepper), Time (Minutes): 20, Frequency (Times/Week): 7, Weekly Exercise (Minutes/Week): 140, Intensity: Mild, Exercise limited by: None identified   Goals Addressed   None    Depression Screen    09/03/2022    9:06 AM 05/20/2022   10:16 AM 05/16/2021    9:07 AM 11/13/2020   10:32 AM 11/09/2020    9:23 AM 11/29/2019    5:04 PM 04/07/2019    8:56 AM  PHQ 2/9 Scores  PHQ - 2 Score 0 0 0 0 0 1 0  PHQ- 9 Score    0  2     Fall Risk    09/02/2022    5:54 PM 05/20/2022   10:16 AM 05/16/2021    9:06 AM 11/09/2020    9:23 AM 05/03/2020   10:18 AM  Fall Risk   Falls in the past year? 0 0 0 0 0  Number falls in past yr: 0 0 0  0  Injury with Fall? 0 0 0  0  Risk for fall due to : No Fall Risks      Follow up Falls evaluation completed Falls evaluation completed Falls prevention discussed  Falls evaluation completed    [place]:  Any stairs in or around the home? No  Home free of loose throw rugs in walkways, pet beds, electrical cords, etc? Yes  Adequate lighting in your home to reduce risk of falls? Yes   ASSISTIVE DEVICES UTILIZED TO  PREVENT FALLS:  Life alert? No  Use of a cane, walker or w/c? No  Grab bars in the bathroom? Yes  Shower chair or bench in shower? No  Elevated toilet seat or a handicapped toilet? No   TIMED UP AND GO:  Was the test performed?  No, audio visit .   Cognitive Function:    10/13/2017    11:47 AM  MMSE - Mini Mental State Exam  Orientation to time 5  Orientation to Place 5  Registration 3  Attention/ Calculation 5  Recall 3  Language- name 2 objects 2  Language- repeat 1  Language- follow 3 step command 3  Language- read & follow direction 1  Write a sentence 1  Copy design 1  Total score 30        09/03/2022    9:15 AM  6CIT Screen  What Year? 0 points  What month? 0 points  What time? 0 points  Count back from 20 0 points  Months in reverse 2 points  Repeat phrase 0 points  Total Score 2 points    Immunizations Immunization History  Administered Date(s) Administered   Fluad Quad(high Dose 65+) 04/01/2019, 05/03/2020, 05/01/2022   Influenza Split 05/20/2011, 03/24/2012   Influenza Whole 04/16/2009, 04/30/2010   Influenza, High Dose Seasonal PF 03/28/2016, 04/29/2017, 03/31/2018   Influenza,inj,Quad PF,6+ Mos 04/08/2013   Influenza-Unspecified 04/18/2021   PFIZER(Purple Top)SARS-COV-2 Vaccination 08/04/2019, 08/25/2019   Pfizer Covid-19 Vaccine Bivalent Booster 78yr & up 05/16/2021   Pneumococcal Conjugate-13 03/28/2016   Pneumococcal Polysaccharide-23 04/01/2019   Respiratory Syncytial Virus Vaccine,Recomb Aduvanted(Arexvy) 06/23/2022   Td 11/26/2009   Zoster, Live 01/02/2010    TDAP status: Due, Education has been provided regarding the importance of this vaccine. Advised may receive this vaccine at local pharmacy or Health Dept. Aware to provide a copy of the vaccination record if obtained from local pharmacy or Health Dept. Verbalized acceptance and understanding.  Flu Vaccine status: Up to date  Pneumococcal vaccine status: Up to date  Covid-19 vaccine status: Information provided on how to obtain vaccines.   Qualifies for Shingles Vaccine? Yes   Zostavax completed Yes   Shingrix Completed?: No.    Education has been provided regarding the importance of this vaccine. Patient has been advised to call insurance company to determine out of  pocket expense if they have not yet received this vaccine. Advised may also receive vaccine at local pharmacy or Health Dept. Verbalized acceptance and understanding.  Screening Tests Health Maintenance  Topic Date Due   Zoster Vaccines- Shingrix (1 of 2) Never done   Diabetic kidney evaluation - Urine ACR  03/28/2017   DTaP/Tdap/Td (2 - Tdap) 11/27/2019   OPHTHALMOLOGY EXAM  05/03/2020   COVID-19 Vaccine (4 - 2023-24 season) 03/14/2022   Medicare Annual Wellness (AWV)  05/16/2022   DEXA SCAN  05/21/2023 (Originally 08/21/2012)   HEMOGLOBIN A1C  10/31/2022   FOOT EXAM  05/02/2023   Diabetic kidney evaluation - eGFR measurement  05/21/2023   Fecal DNA (Cologuard)  08/06/2023   Pneumonia Vaccine 75 Years old  Completed   INFLUENZA VACCINE  Completed   Hepatitis C Screening  Completed   HPV VACCINES  Aged Out   COLONOSCOPY (Pts [phone] coverage will need to be confirmed)  Discontinued    Health Maintenance  Health Maintenance Due  Topic Date Due   Zoster Vaccines- Shingrix (1 of 2) Never done   Diabetic kidney evaluation - Urine ACR  03/28/2017   DTaP/Tdap/Td (2 -  Tdap) 11/27/2019   OPHTHALMOLOGY EXAM  05/03/2020   COVID-19 Vaccine (4 - 2023-24 season) 03/14/2022   Medicare Annual Wellness (AWV)  05/16/2022    Colorectal cancer screening: Type of screening: Cologuard. Completed 08/05/20. Repeat every 3 years  Mammogram status: Completed 07/01/21. Repeat every year  Bone Density status: pt declined  Lung Cancer Screening: (Low Dose CT Chest recommended if Age 21-80 years, 30 pack-year currently smoking OR have quit w/in 15years.) does not qualify.    Additional Screening:  Hepatitis C Screening: does qualify; Completed 03/24/12  Vision Screening: Recommended annual ophthalmology exams for early detection of glaucoma and other disorders of the eye. Is the patient up to date with their annual eye exam?  Yes  Who is the provider or what is the name of the office in  which the patient attends annual eye exams? Dr. Quintin Alto If pt is not established with a provider, would they like to be referred to a provider to establish care? No .   Dental Screening: Recommended annual dental exams for proper oral hygiene  Community Resource Referral / Chronic Care Management: CRR required this visit?  No   CCM required this visit?  No      Plan:     I have personally reviewed and noted the following in the patient's chart:   Medical and social history Use of alcohol, tobacco or illicit drugs  Current medications and supplements including opioid prescriptions. Patient is not currently taking opioid prescriptions. Functional ability and status Nutritional status Physical activity Advanced directives List of other physicians Hospitalizations, surgeries, and ER visits in previous 12 months Vitals Screenings to include cognitive, depression, and falls Referrals and appointments  In addition, I have reviewed and discussed with patient certain preventive protocols, quality metrics, and best practice recommendations. A written personalized care plan for preventive services as well as general preventive health recommendations were provided to patient.   Due to this being a telephonic visit, the after visit summary with patients personalized plan was offered to patient via mail or my-chart. Patient would like to access on my-chart.  Beatris Ship, [place]   09/03/2022   Nurse Notes: None

## 2022-09-03 NOTE — Patient Instructions (Signed)
[name] , Thank you for taking time to come for your Medicare Wellness Visit. I appreciate your ongoing commitment to your health goals. Please review the following plan we discussed and let me know if I can assist you in the future.   These are the goals we discussed:  Goals      Increase physical activity     Patient Stated     Eat healthy     Weight (lb) < 170 lb (77.1 kg)     Exercise at least 2x/ week. Eat healthier.         This is a list of the screening recommended for you and due dates:  Health Maintenance  Topic Date Due   Zoster (Shingles) Vaccine (1 of 2) Never done   Yearly kidney health urinalysis for diabetes  03/28/2017   DTaP/Tdap/Td vaccine (2 - Tdap) 11/27/2019   Eye exam for diabetics  05/03/2020   COVID-19 Vaccine (4 - 2023-24 season) 03/14/2022   DEXA scan (bone density measurement)  05/21/2023*   Hemoglobin A1C  10/31/2022   Complete foot exam   05/02/2023   Yearly kidney function blood test for diabetes  05/21/2023   Cologuard (Stool DNA test)  08/06/2023   Medicare Annual Wellness Visit  09/04/2023   Pneumonia Vaccine  Completed   Flu Shot  Completed   Hepatitis C Screening: USPSTF Recommendation to screen - Ages 18-79 yo.  Completed   HPV Vaccine  Aged Out   Colon Cancer Screening  Discontinued  *Topic was postponed. The date shown is not the original due date.     Next appointment: Follow up in one year for your annual wellness visit.   Preventive Care 75 Years and Older, Female Preventive care refers to lifestyle choices and visits with your health care provider that can promote health and wellness. What does preventive care include? A yearly physical exam. This is also called an annual well check. Dental exams once or twice a year. Routine eye exams. Ask your health care provider how often you should have your eyes checked. Personal lifestyle choices, including: Daily care of your teeth and gums. Regular physical activity. Eating a  healthy diet. Avoiding tobacco and drug use. Limiting alcohol use. Practicing safe sex. Taking low-dose aspirin every day. Taking vitamin and mineral supplements as recommended by your health care provider. What happens during an annual well check? The services and screenings done by your health care provider during your annual well check will depend on your age, overall health, lifestyle risk factors, and family history of disease. Counseling  Your health care provider may ask you questions about your: Alcohol use. Tobacco use. Drug use. Emotional well-being. Home and relationship well-being. Sexual activity. Eating habits. History of falls. Memory and ability to understand (cognition). Work and work Statistician. Reproductive health. Screening  You may have the following tests or measurements: Height, weight, and BMI. Blood pressure. Lipid and cholesterol levels. These may be checked every 5 years, or more frequently if you are over 31 years old. Skin check. Lung cancer screening. You may have this screening every year starting at age 75 if you have a 30-pack-year history of smoking and currently smoke or have quit within the past 15 years. Fecal occult blood test (FOBT) of the stool. You may have this test every year starting at age 75. Flexible sigmoidoscopy or colonoscopy. You may have a sigmoidoscopy every 5 years or a colonoscopy every 10 years starting at age 75. Hepatitis C blood test.  Hepatitis B blood test. Sexually transmitted disease (STD) testing. Diabetes screening. This is done by checking your blood sugar (glucose) after you have not eaten for a while (fasting). You may have this done every 1-3 years. Bone density scan. This is done to screen for osteoporosis. You may have this done starting at age 75. Mammogram. This may be done every 1-2 years. Talk to your health care provider about how often you should have regular mammograms. Talk with your health care  provider about your test results, treatment options, and if necessary, the need for more tests. Vaccines  Your health care provider may recommend certain vaccines, such as: Influenza vaccine. This is recommended every year. Tetanus, diphtheria, and acellular pertussis (Tdap, Td) vaccine. You may need a Td booster every 10 years. Zoster vaccine. You may need this after age 44. Pneumococcal 13-valent conjugate (PCV13) vaccine. One dose is recommended after age 75. Pneumococcal polysaccharide (PPSV23) vaccine. One dose is recommended after age 75. Talk to your health care provider about which screenings and vaccines you need and how often you need them. This information is not intended to replace advice given to you by your health care provider. Make sure you discuss any questions you have with your health care provider. Document Released: 07/27/2015 Document Revised: 03/19/2016 Document Reviewed: 05/01/2015 Elsevier Interactive Patient Education  2017 [place] Prevention in the Home Falls can cause injuries. They can happen to people of all ages. There are many things you can do to make your home safe and to help prevent falls. What can I do on the outside of my home? Regularly fix the edges of walkways and driveways and fix any cracks. Remove anything that might make you trip as you walk through a door, such as a raised step or threshold. Trim any bushes or trees on the path to your home. Use bright outdoor lighting. Clear any walking paths of anything that might make someone trip, such as rocks or tools. Regularly check to see if handrails are loose or broken. Make sure that both sides of any steps have handrails. Any raised decks and porches should have guardrails on the edges. Have any leaves, snow, or ice cleared regularly. Use sand or salt on walking paths during winter. Clean up any spills in your garage right away. This includes oil or grease spills. What can I do in the  bathroom? Use night lights. Install grab bars by the toilet and in the tub and shower. Do not use towel bars as grab bars. Use non-skid mats or decals in the tub or shower. If you need to sit down in the shower, use a plastic, non-slip stool. Keep the floor dry. Clean up any water that spills on the floor as soon as it happens. Remove soap buildup in the tub or shower regularly. Attach bath mats securely with double-sided non-slip rug tape. Do not have throw rugs and other things on the floor that can make you trip. What can I do in the bedroom? Use night lights. Make sure that you have a light by your bed that is easy to reach. Do not use any sheets or blankets that are too big for your bed. They should not hang down onto the floor. Have a firm chair that has side arms. You can use this for support while you get dressed. Do not have throw rugs and other things on the floor that can make you trip. What can I do in the kitchen? Clean up  any spills right away. Avoid walking on wet floors. Keep items that you use a lot in easy-to-reach places. If you need to reach something above you, use a strong step stool that has a grab bar. Keep electrical cords out of the way. Do not use floor polish or wax that makes floors slippery. If you must use wax, use non-skid floor wax. Do not have throw rugs and other things on the floor that can make you trip. What can I do with my stairs? Do not leave any items on the stairs. Make sure that there are handrails on both sides of the stairs and use them. Fix handrails that are broken or loose. Make sure that handrails are as long as the stairways. Check any carpeting to make sure that it is firmly attached to the stairs. Fix any carpet that is loose or worn. Avoid having throw rugs at the top or bottom of the stairs. If you do have throw rugs, attach them to the floor with carpet tape. Make sure that you have a light switch at the top of the stairs and the  bottom of the stairs. If you do not have them, ask someone to add them for you. What else can I do to help prevent falls? Wear shoes that: Do not have high heels. Have rubber bottoms. Are comfortable and fit you well. Are closed at the toe. Do not wear sandals. If you use a stepladder: Make sure that it is fully opened. Do not climb a closed stepladder. Make sure that both sides of the stepladder are locked into place. Ask someone to hold it for you, if possible. Clearly mark and make sure that you can see: Any grab bars or handrails. First and last steps. Where the edge of each step is. Use tools that help you move around (mobility aids) if they are needed. These include: Canes. Walkers. Scooters. Crutches. Turn on the lights when you go into a dark area. Replace any light bulbs as soon as they burn out. Set up your furniture so you have a clear path. Avoid moving your furniture around. If any of your floors are uneven, fix them. If there are any pets around you, be aware of where they are. Review your medicines with your doctor. Some medicines can make you feel dizzy. This can increase your chance of falling. Ask your doctor what other things that you can do to help prevent falls. This information is not intended to replace advice given to you by your health care provider. Make sure you discuss any questions you have with your health care provider. Document Released: 04/26/2009 Document Revised: 12/06/2015 Document Reviewed: 08/04/2014 Elsevier Interactive Patient Education  2017 [employer].

## 2022-09-09 ENCOUNTER — Ambulatory Visit: Payer: Medicare Other | Admitting: Internal Medicine

## 2022-09-09 ENCOUNTER — Telehealth (HOSPITAL_COMMUNITY): Payer: Self-pay | Admitting: Emergency Medicine

## 2022-09-09 ENCOUNTER — Encounter: Payer: Self-pay | Admitting: Internal Medicine

## 2022-09-09 VITALS — BP 120/80 | HR 67 | Ht 62.0 in | Wt 192.6 lb

## 2022-09-09 DIAGNOSIS — E669 Obesity, unspecified: Secondary | ICD-10-CM

## 2022-09-09 DIAGNOSIS — Z794 Long term (current) use of insulin: Secondary | ICD-10-CM

## 2022-09-09 DIAGNOSIS — N1832 Chronic kidney disease, stage 3b: Secondary | ICD-10-CM | POA: Diagnosis not present

## 2022-09-09 DIAGNOSIS — E1122 Type 2 diabetes mellitus with diabetic chronic kidney disease: Secondary | ICD-10-CM

## 2022-09-09 DIAGNOSIS — E785 Hyperlipidemia, unspecified: Secondary | ICD-10-CM

## 2022-09-09 LAB — POCT GLYCOSYLATED HEMOGLOBIN (HGB A1C): Hemoglobin A1C: 6.6 % — AB (ref 4.0–5.6)

## 2022-09-09 MED ORDER — INSULIN GLARGINE 100 UNIT/ML ~~LOC~~ SOLN: 10.0000 [IU] | Freq: Every day | SUBCUTANEOUS | 11 refills | Status: DC

## 2022-09-09 MED ORDER — LANTUS SOLOSTAR 100 UNIT/ML ~~LOC~~ SOPN: 10.0000 [IU] | PEN_INJECTOR | Freq: Every day | SUBCUTANEOUS | 3 refills | Status: DC

## 2022-09-09 NOTE — Progress Notes (Signed)
Patient ID: Bethany Miller, female   DOB: 1945/08/29, 77 y.o.   MRN: [id number]  HPI: Bethany Miller is a 77 y.o.-year-old female, presenting for f/u for DM2, dx 2010, insulin-dependent, uncontrolled, with complications (CKD).  Last visit 4 months ago. She has [employer] part D for meds, [employer], and [employer] for the rest.   Interim history: No increased urination, blurry vision, chest pain.  Reviewed HbA1c levels: Lab Results  Component Value Date   HGBA1C 7.0 (A) 05/01/2022   HGBA1C 6.9 (A) 01/21/2022   HGBA1C 6.3 (A) 09/03/2021   HGBA1C 7.3 (A) 04/16/2021   HGBA1C 7.0 (A) 12/07/2020   HGBA1C 8.1 (A) 08/31/2020   HGBA1C 7.4 (A) 02/28/2020   HGBA1C 9.2 (H) 10/06/2019   HGBA1C 8.6 (H) 04/01/2019   HGBA1C 10.4 (H) 11/30/2018   HGBA1C 9.5 (A) 08/26/2018   HGBA1C 9.8 (A) 03/31/2018   HGBA1C 8.4 11/26/2017   HGBA1C 7.0 07/30/2017   HGBA1C 7.6 (H) 04/21/2017   HGBA1C 7.4 (H) 03/31/2017   HGBA1C 8.5 (H) 02/17/2017   HGBA1C 8.6 (H) 11/11/2016   HGBA1C 7.9% 07/03/2016   HGBA1C 8.8 (H) 03/28/2016   Previously on: - Metformin ER 1000 mg 2x a day (2000 mg with dinner did not change the sugars) >> 1000 mg with breakfast - Glipizide 5 >> 10 mg 2x a day before breakfast and dinner - >> stopped 02/2020 by Dr. Kelton Pillar We tried Ozempic but she developed nausea and vomiting and ended up in the emergency room in 11/2018. We again tried [place]  - added again 04/2018 >> not affordable Stopped  Glipizide 5 mg 2x a day 06/2017 b/c lows, but restarted afterwards. Tradjenta and Alogliptin were not covered She was previously on Invokana, Jardiance 10 mg daily >> too expensive. Tried Cycloset >> too expensive Tried Januvia >>  too expensive.  Tried Metformin 500 mg po >> severe diarrhea. She was on Glimepiride >> hypoglycemia in the 60's on 2 mg daily. She had rapid acting insulin with steroid shots before.   At last visit, I advised her to take: - Metformin ER 500 mg 2x a day >> .Marland KitchenMarland Kitchen500  mg in am >> 500 mg 1-2x a day (2/2 diarrhea) - Lantus 10-12 units in HS - added back 04/2021 >> 12 >> 15 >> 10-12 units daily - Trulicity 1.5 mg weekly (PAP) Stopped Lantus ~09/2020 >> restarted. Previously on glipizide 5 mg before a larger dinner. She was previously on Jardiance but this caused increased urination and vulvar irritation so she stopped.  Pt checks her sugars twice a day: - am:  177, 186, 196 >> 88-140, ave 110-120, 188 >> 90s-128, 130, 169 - cantaloupe - 2h after b'fast: 97, 164, 173 >> n/c - Lunch:  low 100s >> n/c >> 163 >> n/c - 2h after lunch: 160 >> n/c  - Dinnertime: 72  >> n/c >> 122, 129 >> 90-125 >> n/c >> 90s-110 - 2h after dinner: 128-250 >> 99, 200 >> 97-130 >> n/c >> 130-140 - nighttime: 368 >> n/c >> 130 >> n/c Lowest sugar was 56 x1 (on Trulicity - 3 mg weekly from a neighbor) >> 88 >> 90;  she has hypoglycemia awareness in the 90s >> 70s. Highest sugar was 180 (sick) >> 360 >> 188 (raisins in chocolate) >> 169  ReliOn meter.  -+ CKD, last BUN/creatinine:  Lab Results  Component Value Date   BUN 21 05/20/2022   CREATININE 1.16 05/20/2022   No MAU: 06/03/2021 (CCK): <4 Lab Results  Component Value Date   MICRALBCREAT 4 03/28/2016   MICRALBCREAT 0.6 11/16/2015   MICRALBCREAT 0.6 01/26/2013   MICRALBCREAT 0.3 03/24/2012   MICRALBCREAT 0.4 03/18/2011   MICRALBCREAT 11.4 10/29/2009   MICRALBCREAT 3.4 04/09/2009   MICRALBCREAT 17.1 01/25/2009   GFR: Lab Results  Component Value Date   GFRNONAA 29 (L) 11/25/2018   GFRNONAA 45 (L) 04/21/2017   GFRNONAA 54 (L) 02/27/2017   GFRNONAA 35 (L) 02/17/2017   GFRNONAA 52.79 (L) 06/19/2010   GFRNONAA 59.68 10/29/2009   GFRNONAA 53.52 07/03/2009   GFRNONAA 59.78 04/09/2009   GFRNONAA 59.82 01/25/2009   GFRNONAA 78 05/08/2008  On Lisinopril 10.  -+ HL; last set of lipids: Lab Results  Component Value Date   CHOL 149 05/20/2022   HDL 40.30 05/20/2022   LDLCALC 84 05/20/2022   LDLDIRECT 149.6  01/26/2013   TRIG 123.0 05/20/2022   CHOLHDL 4 05/20/2022  On Lipitor 40, Fenofibrate 160.  - last eye exam was in 2023: No DR reportedly. + cataracts.  - no numbness and tingling in her feet.  She sees podiatry for ingrown toenails.  Last foot exam 05/01/2022.  She also has a history of HTN. 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  ROS: + See HPI  I reviewed pt's medications, allergies, PMH, social hx, family hx, and changes were documented in the history of present illness. Otherwise, unchanged from my initial visit note.  Past Medical History:  Diagnosis Date   Diabetes mellitus    Hyperlipidemia    Hypertension    Murmur    Past Surgical History:  Procedure Laterality Date   ABDOMINAL HYSTERECTOMY     BACK SURGERY     CHOLECYSTECTOMY     TONSILLECTOMY     TOTAL KNEE ARTHROPLASTY     TOTAL KNEE ARTHROPLASTY Right 02/26/2017   Procedure: RIGHT TOTAL KNEE ARTHROPLASTY;  Surgeon: Susa Day, MD;  Location: [facility];  Service: Orthopedics;  Laterality: Right;   Social History   Socioeconomic History   Marital status: Widowed    Spouse name: Not on file   Number of children: Not on file   Years of education: Not on file   Highest education level: Not on file  Occupational History   Not on file  Tobacco Use   Smoking status: Never   Smokeless tobacco: Never  Substance and Sexual Activity   Alcohol use: Yes    Comment: rare   Drug use: No   Sexual activity: Not Currently    Partners: Male  Other Topics Concern   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   Caffeine use: coffee in the am   Social Determinants of Health   Financial Resource Strain: Low Risk  (09/02/2022)   Overall Financial Resource Strain (CARDIA)    Difficulty of Paying Living Expenses: Not very hard  Food Insecurity: No Food Insecurity (09/02/2022)   Hunger Vital Sign    Worried About Running Out of Food in the Last Year: Never true    Ran Out of Food in the Last  Year: Never true  Transportation Needs: No Transportation Needs (09/02/2022)   PRAPARE - Hydrologist (Medical): No    Lack of Transportation (Non-Medical): No  Physical Activity: Insufficiently Active (09/02/2022)   Exercise Vital Sign    Days of Exercise per Week: 2 days    Minutes of Exercise per Session: 30 min  Stress: No Stress Concern Present (09/02/2022)  [place] Questionnaire    Feeling of Stress : Only a little  Social Connections: Moderately Integrated (09/02/2022)   Social Connection and Isolation Panel [NHANES]    Frequency of Communication with Friends and Family: More than three times a week    Frequency of Social Gatherings with Friends and Family: More than three times a week    Attends Religious Services: More than 4 times per year    Active Member of [employer] or Organizations: Yes    Attends Archivist Meetings: More than 4 times per year    Marital Status: Widowed  Intimate Partner Violence: Not At Risk (09/03/2022)   Humiliation, Afraid, Rape, and Kick questionnaire    Fear of Current or Ex-Partner: No    Emotionally Abused: No    Physically Abused: No    Sexually Abused: No   Current Outpatient Medications on File Prior to Visit  Medication Sig Dispense Refill   acetaminophen (TYLENOL) 650 MG CR tablet Take 650 mg by mouth every 8 (eight) hours as needed for pain.     Ascorbic Acid (VITAMIN C) 100 MG tablet Take 100 mg by mouth daily.     aspirin EC 81 MG tablet Take 81 mg by mouth daily.     atorvastatin (LIPITOR) 80 MG tablet Take 1 tablet (80 mg total) by mouth daily. 90 tablet 3   cholecalciferol (VITAMIN D3) 25 MCG (1000 UNIT) tablet Take 2,000 Units by mouth daily.     citalopram (CELEXA) 40 MG tablet TAKE 1 TABLET EVERY DAY 90 tablet 3   docusate sodium (COLACE) 100 MG capsule Take 1 capsule (100 mg total) by mouth 2 (two) times daily as needed for mild constipation.  30 capsule 1   Dulaglutide (TRULICITY) 1.5 0000000 SOPN Inject 1.5 mg into the skin once a week. 12 mL 3   fluticasone (FLONASE) 50 MCG/ACT nasal spray Place 2 sprays into both nostrils daily. 16 g 6   glucose blood (ACCU-CHEK GUIDE) test strip USE 1 STRIP TO CHECK GLUCOSE ONCE DAILY 100 each 4   Insulin Admin Supplies MISC Use to inject insulin 2 times a day. 180 each 0   insulin glargine (LANTUS SOLOSTAR) 100 UNIT/ML Solostar Pen Inject 10-15 Units into the skin daily. 30 mL 3   Insulin Pen Needle 32G X 4 MM MISC Use 1x a day 100 each 3   Lancets (ACCU-CHEK MULTICLIX) lancets Use as instructed to test once a day DX E11.51 100 each 1   lisinopril-hydrochlorothiazide (ZESTORETIC) 10-12.5 MG tablet TAKE 1 TABLET DAILY 90 tablet 3   Loratadine-Pseudoephedrine (CLARITIN-D 12 HOUR PO) Take 1 tablet by mouth daily as needed (sinus).      meloxicam (MOBIC) 7.5 MG tablet TAKE 1 TABLET DAILY 90 tablet 3   metFORMIN (GLUCOPHAGE-XR) 500 MG 24 hr tablet Take 1 tablet (500 mg total) by mouth 2 (two) times daily. 180 tablet 3   Multiple Vitamins-Minerals (MULTIVITAMIN ADULT PO) Take 1 tablet by mouth daily.      omeprazole (PRILOSEC) 20 MG capsule Take 1 capsule (20 mg total) by mouth daily. 90 capsule 1   traZODone (DESYREL) 50 MG tablet TAKE 1/2 TO 1 TABLET BY MOUTH AT BEDTIME AS NEEDED FOR SLEEP 90 tablet 1   vitamin B-12 (CYANOCOBALAMIN) 100 MCG tablet Take 100 mcg by mouth daily.     zinc gluconate 50 MG tablet Take 50 mg by mouth daily.     No current facility-administered medications on file prior  to visit.   Allergies  Allergen Reactions   Ozempic (0.25 Or 0.5 Mg-Dose) [Semaglutide(0.25 Or 0.'5mg'$ -Dos)] Diarrhea and Nausea And Vomiting    Pt stated, "I got dehydrated from this as well"   Family History  Problem Relation Age of Onset   Throat cancer Brother    Heart disease Brother        MI   Cancer Brother 80       throat,    Hyperlipidemia Brother    Cirrhosis Father    Alcohol abuse  Father    Heart disease Brother    Cancer Brother    Arthritis Mother    Hypertension Sister    Arthritis Sister    Alzheimer's disease Sister    Cancer Brother    Cancer Brother 22       melanoma   Hypertension Brother    Hyperlipidemia Brother    Melanoma Other    Arthritis Other    PE: BP 120/80 (BP Location: Right Arm, Patient Position: Sitting, Cuff Size: Normal)   Pulse 67   Ht '5\' 2"'$  (1.575 m)   Wt 192 lb 9.6 oz (87.4 kg)   SpO2 96%   BMI 35.23 kg/m  Wt Readings from Last 3 Encounters:  09/09/22 192 lb 9.6 oz (87.4 kg)  06/26/22 195 lb (88.5 kg)  05/20/22 194 lb 12.8 oz (88.4 kg)   Constitutional: overweight, in NAD Eyes:EOMI, no exophthalmos ENT: no thyromegaly, no cervical lymphadenopathy Cardiovascular: RRR, No MRG, B LE edema  Respiratory: CTA B Musculoskeletal: no deformities Skin: no rashes Neurological: no tremor with outstretched hands  ASSESSMENT: 1. DM2, insulin-dependent, uncontrolled, with long-term complications: - CKD  2. HL   3. Obesity class II  PLAN:  1. Patient with history of uncontrolled diabetes, on oral antidiabetic regimen with metformin, weekly GLP-1 receptor agonist and daily long-acting insulin, with fair control.  At last visit, HbA1c was higher, at 7.0%, after having had higher blood sugars after dietary indiscretions (chocolate covered raisins).  I recommended against eating dried fruit due to the high concentration of sugar.  However, otherwise, her regimen was appropriate so I did not recommend a change.  She had some nausea and diarrhea after starting Trulicity after a period being off the medication, but the symptoms were improving. - at today's visit, blood sugars are almost all at goal, with few hyperglycemic exceptions after dietary indiscretions.  No need to change the regimen for now, especially since her HbA1c is lower. - We will resubmit her information to the [place] patient assistance program for Trulicity - she also  wanted me to print her prescription for Lantus vial to see if this will be cheaper for her.  Otherwise, I sent a prescription for Lantus pen needles to [facility]. - I suggested to:  Patient Instructions  Please continue: - Metformin 500 mg 2x a day - Lantus 10-12 units daily - Trulicity 1.5 mg weekly   Please return in  6 months with your sugar log.   - we checked her HbA1c: 6.6% (lower) - advised to check sugars at different times of the day - 1x a day, rotating check times - advised for yearly eye exams >> she is UTD - return to clinic in 6 months - per her preference   2. HL -Reviewed the latest lipid panel from 05/2022: LDL above our target of less than 70, otherwise fractions at goal Lab Results  Component Value Date   CHOL 149 05/20/2022   HDL 40.30 05/20/2022  LDLCALC 84 05/20/2022   LDLDIRECT 149.6 01/26/2013   TRIG 123.0 05/20/2022   CHOLHDL 4 05/20/2022  -She is on Lipitor 40 mg daily and fenofibrate 160 mg daily without side effects  3.  Obesity class II -She could not tolerate Ozempic due to nausea and vomiting, but she was tolerating Trulicity well.  Unfortunately, this was not covered for her  -She now obtains Trulicity through the patient assistance program -Before last visit, she lost 10 pounds; since then she lost 2 lbs  Philemon Kingdom, MD PhD [facility] Endocrinology

## 2022-09-09 NOTE — Patient Instructions (Addendum)
Please continue: - Metformin 500 mg 2x a day - Lantus 10-12 units daily - Trulicity 1.5 mg weekly   Please return in 6 months with your sugar log.

## 2022-09-09 NOTE — Telephone Encounter (Signed)
Reaching out to patient to offer assistance regarding upcoming cardiac imaging study; pt verbalizes understanding of appt date/time, parking situation and where to check in, pre-test NPO status and medications ordered, and verified current allergies; name and call back number provided for further questions should they arise Karys Meckley RN Navigator Cardiac Imaging [facility] [phone] office [phone] cell 

## 2022-09-10 ENCOUNTER — Encounter (HOSPITAL_COMMUNITY)
Admission: RE | Admit: 2022-09-10 | Discharge: 2022-09-10 | Disposition: A | Discharge status: Home / Self Care | Payer: Medicare Other | Source: Ambulatory Visit | Attending: Internal Medicine | Admitting: Internal Medicine

## 2022-09-10 DIAGNOSIS — R0609 Other forms of dyspnea: Secondary | ICD-10-CM | POA: Diagnosis present

## 2022-09-10 DIAGNOSIS — R011 Cardiac murmur, unspecified: Secondary | ICD-10-CM | POA: Insufficient documentation

## 2022-09-10 DIAGNOSIS — I10 Essential (primary) hypertension: Secondary | ICD-10-CM | POA: Diagnosis present

## 2022-09-10 DIAGNOSIS — I2510 Atherosclerotic heart disease of native coronary artery without angina pectoris: Secondary | ICD-10-CM | POA: Diagnosis present

## 2022-09-10 DIAGNOSIS — I700 Atherosclerosis of aorta: Secondary | ICD-10-CM | POA: Insufficient documentation

## 2022-09-10 DIAGNOSIS — E782 Mixed hyperlipidemia: Secondary | ICD-10-CM | POA: Insufficient documentation

## 2022-09-10 DIAGNOSIS — I2584 Coronary atherosclerosis due to calcified coronary lesion: Secondary | ICD-10-CM | POA: Diagnosis present

## 2022-09-10 LAB — NM PET CT CARDIAC PERFUSION MULTI W/ABSOLUTE BLOODFLOW
LV dias vol: 93 mL (ref 46–106)
LV sys vol: 19 mL
MBFR: 2.58
Nuc Rest EF: 74 %
Nuc Stress EF: 80 %
Peak HR: 81 {beats}/min
Rest HR: 63 {beats}/min
Rest MBF: 0.76 ml/g/min
Rest Nuclear Isotope Dose: 22.6 mCi
ST Depression (mm): 0 mm
Stress MBF: 1.96 ml/g/min
Stress Nuclear Isotope Dose: 22.5 mCi
TID: 1.09

## 2022-09-10 MED ORDER — REGADENOSON 0.4 MG/5ML IV SOLN
0.4000 mg | Freq: Once | INTRAVENOUS | Status: AC
  Administered 2022-09-10: 0.4 mg via INTRAVENOUS

## 2022-09-10 MED ORDER — RUBIDIUM RB82 GENERATOR (RUBYFILL)
20.0000 | PACK | Freq: Once | INTRAVENOUS | Status: AC
  Administered 2022-09-10: 22.6 via INTRAVENOUS

## 2022-09-10 MED ORDER — RUBIDIUM RB82 GENERATOR (RUBYFILL)
20.0000 | PACK | Freq: Once | INTRAVENOUS | Status: AC
  Administered 2022-09-10: 22.53 via INTRAVENOUS

## 2022-09-22 ENCOUNTER — Encounter: Payer: Self-pay | Admitting: Internal Medicine

## 2022-09-22 DIAGNOSIS — Z794 Long term (current) use of insulin: Secondary | ICD-10-CM

## 2022-09-22 MED FILL — Regadenoson IV Inj 0.4 MG/5ML (0.08 MG/ML): INTRAVENOUS | Qty: 5 | Status: AC

## 2022-09-23 MED ORDER — TRULICITY 1.5 MG/0.5ML ~~LOC~~ SOAJ: 1.5000 mg | SUBCUTANEOUS | 2 refills | Status: DC

## 2022-09-25 ENCOUNTER — Encounter: Payer: Self-pay | Admitting: Internal Medicine

## 2022-09-30 ENCOUNTER — Encounter: Payer: Self-pay | Admitting: Internal Medicine

## 2022-10-02 ENCOUNTER — Telehealth: Payer: Self-pay

## 2022-10-02 MED ORDER — TRULICITY 0.75 MG/0.5ML ~~LOC~~ SOAJ: 0.7500 mg | SUBCUTANEOUS | 3 refills | Status: DC

## 2022-10-02 NOTE — Telephone Encounter (Signed)
Pt confirmed .75 mg dose. Rx sent.

## 2022-10-02 NOTE — Telephone Encounter (Signed)
If we can use 0.75 mg x 2 a week, that would be ideal.  If not, we will need to know patient's preference.  I would tend to suggest the higher dose.

## 2022-10-02 NOTE — Telephone Encounter (Signed)
Addressed in separate encounter.

## 2022-10-02 NOTE — Telephone Encounter (Signed)
Pt contacted to confirm which option. Waiting to hear back.

## 2022-10-02 NOTE — Telephone Encounter (Signed)
Pt is on the 1.5 mg dose of Trulicity and they are on backorder for it. They have 0.75, 3.0, and 4.5 mg doses available.

## 2022-11-10 LAB — HM DIABETES EYE EXAM

## 2022-11-18 ENCOUNTER — Ambulatory Visit (INDEPENDENT_AMBULATORY_CARE_PROVIDER_SITE_OTHER): Payer: Medicare Other | Admitting: Family Medicine

## 2022-11-18 ENCOUNTER — Encounter: Payer: Self-pay | Admitting: Family Medicine

## 2022-11-18 ENCOUNTER — Other Ambulatory Visit (HOSPITAL_BASED_OUTPATIENT_CLINIC_OR_DEPARTMENT_OTHER): Payer: Self-pay | Admitting: Family Medicine

## 2022-11-18 VITALS — BP 120/60 | HR 58 | Temp 97.8°F | Resp 18 | Ht 62.0 in | Wt 195.4 lb

## 2022-11-18 DIAGNOSIS — E1122 Type 2 diabetes mellitus with diabetic chronic kidney disease: Secondary | ICD-10-CM

## 2022-11-18 DIAGNOSIS — J0140 Acute pansinusitis, unspecified: Secondary | ICD-10-CM

## 2022-11-18 DIAGNOSIS — I10 Essential (primary) hypertension: Secondary | ICD-10-CM | POA: Diagnosis not present

## 2022-11-18 DIAGNOSIS — E785 Hyperlipidemia, unspecified: Secondary | ICD-10-CM | POA: Diagnosis not present

## 2022-11-18 DIAGNOSIS — I700 Atherosclerosis of aorta: Secondary | ICD-10-CM

## 2022-11-18 DIAGNOSIS — Z7985 Long-term (current) use of injectable non-insulin antidiabetic drugs: Secondary | ICD-10-CM

## 2022-11-18 DIAGNOSIS — E1169 Type 2 diabetes mellitus with other specified complication: Secondary | ICD-10-CM | POA: Diagnosis not present

## 2022-11-18 DIAGNOSIS — Z0000 Encounter for general adult medical examination without abnormal findings: Secondary | ICD-10-CM | POA: Diagnosis not present

## 2022-11-18 DIAGNOSIS — E2839 Other primary ovarian failure: Secondary | ICD-10-CM

## 2022-11-18 DIAGNOSIS — E559 Vitamin D deficiency, unspecified: Secondary | ICD-10-CM

## 2022-11-18 DIAGNOSIS — R0609 Other forms of dyspnea: Secondary | ICD-10-CM

## 2022-11-18 DIAGNOSIS — N1832 Chronic kidney disease, stage 3b: Secondary | ICD-10-CM

## 2022-11-18 DIAGNOSIS — I2584 Coronary atherosclerosis due to calcified coronary lesion: Secondary | ICD-10-CM

## 2022-11-18 DIAGNOSIS — I2510 Atherosclerotic heart disease of native coronary artery without angina pectoris: Secondary | ICD-10-CM

## 2022-11-18 DIAGNOSIS — M5441 Lumbago with sciatica, right side: Secondary | ICD-10-CM

## 2022-11-18 DIAGNOSIS — E538 Deficiency of other specified B group vitamins: Secondary | ICD-10-CM

## 2022-11-18 DIAGNOSIS — K219 Gastro-esophageal reflux disease without esophagitis: Secondary | ICD-10-CM

## 2022-11-18 DIAGNOSIS — Z1231 Encounter for screening mammogram for malignant neoplasm of breast: Secondary | ICD-10-CM

## 2022-11-18 DIAGNOSIS — F411 Generalized anxiety disorder: Secondary | ICD-10-CM | POA: Diagnosis not present

## 2022-11-18 DIAGNOSIS — R011 Cardiac murmur, unspecified: Secondary | ICD-10-CM

## 2022-11-18 DIAGNOSIS — D044 Carcinoma in situ of skin of scalp and neck: Secondary | ICD-10-CM

## 2022-11-18 DIAGNOSIS — E782 Mixed hyperlipidemia: Secondary | ICD-10-CM

## 2022-11-18 DIAGNOSIS — F32A Depression, unspecified: Secondary | ICD-10-CM

## 2022-11-18 DIAGNOSIS — G4700 Insomnia, unspecified: Secondary | ICD-10-CM

## 2022-11-18 LAB — COMPREHENSIVE METABOLIC PANEL
ALT: 16 U/L (ref 0–35)
AST: 15 U/L (ref 0–37)
Albumin: 3.8 g/dL (ref 3.5–5.2)
Alkaline Phosphatase: 100 U/L (ref 39–117)
BUN: 19 mg/dL (ref 6–23)
CO2: 28 mEq/L (ref 19–32)
Calcium: 8.8 mg/dL (ref 8.4–10.5)
Chloride: 104 mEq/L (ref 96–112)
Creatinine, Ser: 1.19 mg/dL (ref 0.40–1.20)
GFR: 44.81 mL/min — ABNORMAL LOW (ref >60.00)
Glucose, Bld: 188 mg/dL — ABNORMAL HIGH (ref 70–99)
Potassium: 4.3 mEq/L (ref 3.5–5.1)
Sodium: 139 mEq/L (ref 135–145)
Total Bilirubin: 0.5 mg/dL (ref 0.2–1.2)
Total Protein: 6.4 g/dL (ref 6.0–8.3)

## 2022-11-18 LAB — LIPID PANEL
Cholesterol: 146 mg/dL (ref 0–200)
HDL: 40.6 mg/dL (ref >39.00)
LDL Cholesterol: 78 mg/dL (ref 0–99)
NonHDL: 105.28
Total CHOL/HDL Ratio: 4
Triglycerides: 134 mg/dL (ref 0.0–149.0)
VLDL: 26.8 mg/dL (ref 0.0–40.0)

## 2022-11-18 LAB — MICROALBUMIN / CREATININE URINE RATIO
Creatinine,U: 136.4 mg/dL
Microalb Creat Ratio: 1 mg/g (ref 0.0–30.0)
Microalb, Ur: 1.4 mg/dL (ref 0.0–1.9)

## 2022-11-18 LAB — CBC WITH DIFFERENTIAL/PLATELET
Basophils Absolute: 0 10*3/uL (ref 0.0–0.1)
Basophils Relative: 0.8 % (ref 0.0–3.0)
Eosinophils Absolute: 0.2 10*3/uL (ref 0.0–0.7)
Eosinophils Relative: 3 % (ref 0.0–5.0)
HCT: 37.3 % (ref 36.0–46.0)
Hemoglobin: 12.3 g/dL (ref 12.0–15.0)
Lymphocytes Relative: 23.7 % (ref 12.0–46.0)
Lymphs Abs: 1.4 10*3/uL (ref 0.7–4.0)
MCHC: 33.1 g/dL (ref 30.0–36.0)
MCV: 89.2 fl (ref 78.0–100.0)
Monocytes Absolute: 0.4 10*3/uL (ref 0.1–1.0)
Monocytes Relative: 7.1 % (ref 3.0–12.0)
Neutro Abs: 3.9 10*3/uL (ref 1.4–7.7)
Neutrophils Relative %: 65.4 % (ref 43.0–77.0)
Platelets: 237 10*3/uL (ref 150.0–400.0)
RBC: 4.18 Mil/uL (ref 3.87–5.11)
RDW: 14.2 % (ref 11.5–15.5)
WBC: 5.9 10*3/uL (ref 4.0–10.5)

## 2022-11-18 MED ORDER — ATORVASTATIN CALCIUM 80 MG PO TABS: 80.0000 mg | ORAL_TABLET | Freq: Every day | ORAL | 3 refills | Status: DC

## 2022-11-18 MED ORDER — TRAZODONE HCL 50 MG PO TABS
25.0000 mg | ORAL_TABLET | Freq: Every evening | ORAL | 1 refills | Status: DC | PRN
Start: 2022-11-18 — End: 2023-04-30

## 2022-11-18 MED ORDER — LISINOPRIL-HYDROCHLOROTHIAZIDE 10-12.5 MG PO TABS
1.0000 | ORAL_TABLET | Freq: Every day | ORAL | 3 refills | Status: DC
Start: 2022-11-18 — End: 2023-11-09

## 2022-11-18 MED ORDER — FLUTICASONE PROPIONATE 50 MCG/ACT NA SUSP: 2.0000 | Freq: Every day | NASAL | 6 refills | Status: AC

## 2022-11-18 MED ORDER — OMEPRAZOLE 20 MG PO CPDR: 20.0000 mg | DELAYED_RELEASE_CAPSULE | Freq: Every day | ORAL | 1 refills | Status: DC

## 2022-11-18 MED ORDER — MELOXICAM 7.5 MG PO TABS: 7.5000 mg | ORAL_TABLET | Freq: Every day | ORAL | 3 refills | Status: DC

## 2022-11-18 MED ORDER — CITALOPRAM HYDROBROMIDE 40 MG PO TABS: ORAL_TABLET | ORAL | 3 refills | Status: DC

## 2022-11-18 NOTE — Assessment & Plan Note (Signed)
Stable-- she is under a lot of stress--- her son has been having some marital problems and hip pain

## 2022-11-18 NOTE — Assessment & Plan Note (Signed)
Tolerating statin, encouraged heart healthy diet, avoid trans fats, minimize simple carbs and saturated fats. Increase exercise as tolerated 

## 2022-11-18 NOTE — Assessment & Plan Note (Signed)
Well controlled, no changes to meds. Encouraged heart healthy diet such as the DASH diet and exercise as tolerated.  °

## 2022-11-18 NOTE — Assessment & Plan Note (Signed)
Ghm utd Check labs  Health Maintenance  Topic Date Due   Zoster Vaccines- Shingrix (1 of 2) Never done   Diabetic kidney evaluation - Urine ACR  03/28/2017   DTaP/Tdap/Td (2 - Tdap) 11/27/2019   OPHTHALMOLOGY EXAM  05/03/2020   COVID-19 Vaccine (4 - 2023-24 season) 12/04/2022 (Originally 03/14/2022)   DEXA SCAN  05/21/2023 (Originally 08/21/2012)   INFLUENZA VACCINE  02/12/2023   HEMOGLOBIN A1C  03/10/2023   FOOT EXAM  05/02/2023   Diabetic kidney evaluation - eGFR measurement  05/21/2023   Fecal DNA (Cologuard)  08/06/2023   Medicare Annual Wellness (AWV)  09/04/2023   Pneumonia Vaccine 1+ Years old  Completed   Hepatitis C Screening  Completed   HPV VACCINES  Aged Out   COLONOSCOPY (Pts 45-71yrs Insurance coverage will need to be confirmed)  Discontinued

## 2022-11-18 NOTE — Assessment & Plan Note (Signed)
Per dermatology. 

## 2022-11-18 NOTE — Progress Notes (Signed)
Subjective:   By signing my name below, I, Shehryar Baig, attest that this documentation has been prepared under the direction and in the presence of Donato Schultz, DO. 11/18/2022   Patient ID: Bethany Miller, female    DOB: 1946/03/14, 77 y.o.   MRN: [id number]  Chief Complaint  Patient presents with   Annual Exam    Pt states fasting     HPI Patient is in today for a comprehensive physical exam.   She reports having an upcomming cataracts procedure.  She continues following up with her dermatologist and endocrinologist regularly.  She denies fever, new moles, congestion, sinus pain, sore throat, chest pain, palpitations, cough, shortness of breath, wheezing, nausea, vomiting, abdominal pain, diarrhea, constipation, dysuria, frequency, hematuria, new muscle pain, new joint pain, or headaches at this time.  She has no changes to family medical history.  She is participating in regular exercise. She is taking 0.75 mg Trulicity at this time due to her pharmacy not have her correct dose in stock. She has gained weight since she had to decrease her dosage. She also reports her blood sugars are elevated while taking this dosage.  Wt Readings from Last 3 Encounters:  11/18/22 195 lb 6.4 oz (88.6 kg)  09/09/22 192 lb 9.6 oz (87.4 kg)  06/26/22 195 lb (88.5 kg)   Lab Results  Component Value Date   HGBA1C 6.6 (A) 09/09/2022   She is due for shingles and tetanus vaccine and is interested in receiving them at her pharmacy.  Mammogram was last completed 07/01/2021. Results are normal. Repeat in 1 year.  She is UTD on vision care.  She is due for dental care and is interested in scheduling an appointment.   Past Medical History:  Diagnosis Date   Diabetes mellitus    Hyperlipidemia    Hypertension    Murmur     Past Surgical History:  Procedure Laterality Date   ABDOMINAL HYSTERECTOMY     Miller SURGERY     CHOLECYSTECTOMY     TONSILLECTOMY     TOTAL KNEE ARTHROPLASTY      TOTAL KNEE ARTHROPLASTY Right 02/26/2017   Procedure: RIGHT TOTAL KNEE ARTHROPLASTY;  Surgeon: Jene Every, MD;  Location: [facility];  Service: Orthopedics;  Laterality: Right;    Family History  Problem Relation Age of Onset   Throat cancer Brother    Heart disease Brother        MI   Cancer Brother 49       throat,    Hyperlipidemia Brother    Cirrhosis Father    Alcohol abuse Father    Heart disease Brother    Cancer Brother    Arthritis Mother    Hypertension Sister    Arthritis Sister    Alzheimer's disease Sister    Cancer Brother    Cancer Brother 50       melanoma   Hypertension Brother    Hyperlipidemia Brother    Melanoma Other    Arthritis Other     Social History   Socioeconomic History   Marital status: Widowed    Spouse name: Not on file   Number of children: Not on file   Years of education: Not on file   Highest education level: Not on file  Occupational History   Not on file  Tobacco Use   Smoking status: Never   Smokeless tobacco: Never  Substance and Sexual Activity   Alcohol use: Yes    Comment:  rare   Drug use: No   Sexual activity: Not Currently    Partners: Male  Other Topics Concern   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   Caffeine use: coffee in the am   Social Determinants of Health   Financial Resource Strain: Low Risk  (09/02/2022)   Overall Financial Resource Strain (CARDIA)    Difficulty of Paying Living Expenses: Not very hard  Food Insecurity: No Food Insecurity (09/02/2022)   Hunger Vital Sign    Worried About Running Out of Food in the Last Year: Never true    Ran Out of Food in the Last Year: Never true  Transportation Needs: No Transportation Needs (09/02/2022)   PRAPARE - Administrator, Civil Service (Medical): No    Lack of Transportation (Non-Medical): No  Physical Activity: Insufficiently Active (09/02/2022)   Exercise Vital Sign    Days of Exercise per  Week: 2 days    Minutes of Exercise per Session: 30 min  Stress: No Stress Concern Present (09/02/2022)   [employer] of Occupational Health - Occupational Stress Questionnaire    Feeling of Stress : Only a little  Social Connections: Moderately Integrated (09/02/2022)   Social Connection and Isolation Panel [NHANES]    Frequency of Communication with Friends and Family: More than three times a week    Frequency of Social Gatherings with Friends and Family: More than three times a week    Attends Religious Services: More than 4 times per year    Active Member of [employer] or Organizations: Yes    Attends Banker Meetings: More than 4 times per year    Marital Status: Widowed  Intimate Partner Violence: Not At Risk (09/03/2022)   Humiliation, Afraid, Rape, and Kick questionnaire    Fear of Current or Ex-Partner: No    Emotionally Abused: No    Physically Abused: No    Sexually Abused: No    Outpatient Medications Prior to Visit  Medication Sig Dispense Refill   acetaminophen (TYLENOL) 650 MG CR tablet Take 650 mg by mouth every 8 (eight) hours as needed for pain.     Ascorbic Acid (VITAMIN C) 100 MG tablet Take 100 mg by mouth daily.     aspirin EC 81 MG tablet Take 81 mg by mouth daily.     cholecalciferol (VITAMIN D3) 25 MCG (1000 UNIT) tablet Take 2,000 Units by mouth daily.     docusate sodium (COLACE) 100 MG capsule Take 1 capsule (100 mg total) by mouth 2 (two) times daily as needed for mild constipation. 30 capsule 1   Dulaglutide (TRULICITY) 0.75 MG/0.5ML SOPN Inject 0.75 mg into the skin once a week. 6 mL 3   Dulaglutide (TRULICITY) 1.5 MG/0.5ML SOPN Inject 1.5 mg into the skin once a week. 8 mL 2   glucose blood (ACCU-CHEK GUIDE) test strip USE 1 STRIP TO CHECK GLUCOSE ONCE DAILY 100 each 4   Insulin Admin Supplies MISC Use to inject insulin 2 times a day. 180 each 0   insulin glargine (LANTUS SOLOSTAR) 100 UNIT/ML Solostar Pen Inject 10-15 Units into the skin  daily. 30 mL 3   insulin glargine (LANTUS) 100 UNIT/ML injection Inject 0.1-0.15 mLs (10-15 Units total) into the skin daily. 10 mL 11   Insulin Pen Needle 32G X 4 MM MISC Use 1x a day 100 each 3   Lancets (ACCU-CHEK MULTICLIX) lancets Use as instructed to test once a  day DX E11.51 100 each 1   Loratadine-Pseudoephedrine (CLARITIN-D 12 HOUR PO) Take 1 tablet by mouth daily as needed (sinus).      metFORMIN (GLUCOPHAGE-XR) 500 MG 24 hr tablet Take 1 tablet (500 mg total) by mouth 2 (two) times daily. 180 tablet 3   Multiple Vitamins-Minerals (MULTIVITAMIN ADULT PO) Take 1 tablet by mouth daily.      vitamin B-12 (CYANOCOBALAMIN) 100 MCG tablet Take 100 mcg by mouth daily.     zinc gluconate 50 MG tablet Take 50 mg by mouth daily.     atorvastatin (LIPITOR) 80 MG tablet Take 1 tablet (80 mg total) by mouth daily. 90 tablet 3   citalopram (CELEXA) 40 MG tablet TAKE 1 TABLET EVERY DAY 90 tablet 3   fluticasone (FLONASE) 50 MCG/ACT nasal spray Place 2 sprays into both nostrils daily. 16 g 6   lisinopril-hydrochlorothiazide (ZESTORETIC) 10-12.5 MG tablet TAKE 1 TABLET DAILY 90 tablet 3   meloxicam (MOBIC) 7.5 MG tablet TAKE 1 TABLET DAILY 90 tablet 3   omeprazole (PRILOSEC) 20 MG capsule Take 1 capsule (20 mg total) by mouth daily. 90 capsule 1   traZODone (DESYREL) 50 MG tablet TAKE 1/2 TO 1 TABLET BY MOUTH AT BEDTIME AS NEEDED FOR SLEEP 90 tablet 1   No facility-administered medications prior to visit.    Allergies  Allergen Reactions   Ozempic (0.25 Or 0.5 Mg-Dose) [Semaglutide(0.25 Or 0.5mg -Dos)] Diarrhea and Nausea And Vomiting    Pt stated, "I got dehydrated from this as well"    Review of Systems  Constitutional:  Negative for fever.  HENT:  Negative for congestion, sinus pain and sore throat.   Respiratory:  Negative for cough, shortness of breath and wheezing.   Cardiovascular:  Negative for chest pain and palpitations.  Gastrointestinal:  Negative for abdominal pain, constipation,  diarrhea, nausea and vomiting.  Genitourinary:  Negative for dysuria, frequency and hematuria.  Musculoskeletal:        (-)new muscle pain (-)new joint pain  Skin:        (-)New moles  Neurological:  Negative for headaches.       Objective:    Physical Exam Constitutional:      General: She is not in acute distress.    Appearance: Normal appearance. She is well-developed. She is not ill-appearing.  HENT:     Head: Normocephalic and atraumatic.     Right Ear: Tympanic membrane, ear canal and external ear normal.     Left Ear: Tympanic membrane, ear canal and external ear normal.     Nose: Nose normal.  Eyes:     Extraocular Movements: Extraocular movements intact.     Pupils: Pupils are equal, round, and reactive to light.  Cardiovascular:     Rate and Rhythm: Normal rate and regular rhythm.     Heart sounds: Normal heart sounds. No murmur heard.    No gallop.  Pulmonary:     Effort: Pulmonary effort is normal. No respiratory distress.     Breath sounds: Normal breath sounds. No wheezing or rales.  Chest:     Chest wall: No tenderness.  Abdominal:     General: Bowel sounds are normal. There is no distension.     Palpations: Abdomen is soft.     Tenderness: There is no abdominal tenderness. There is no guarding.  Musculoskeletal:        General: Normal range of motion.     Cervical Miller: Normal range of motion and neck supple.  Skin:  General: Skin is warm and dry.  Neurological:     General: No focal deficit present.     Mental Status: She is alert and oriented to person, place, and time.  Psychiatric:        Behavior: Behavior normal.        Thought Content: Thought content normal.        Judgment: Judgment normal.     BP 120/60 (BP Location: Left Arm, Patient Position: Sitting, Cuff Size: Large)   Pulse (!) 58   Temp 97.8 F (36.6 C) (Oral)   Resp 18   Ht 5\' 2"  (1.575 m)   Wt 195 lb 6.4 oz (88.6 kg)   SpO2 94%   BMI 35.74 kg/m  Wt Readings from Last 3  Encounters:  11/18/22 195 lb 6.4 oz (88.6 kg)  09/09/22 192 lb 9.6 oz (87.4 kg)  06/26/22 195 lb (88.5 kg)       Assessment & Plan:  Preventative health care Assessment & Plan: Ghm utd Check labs  Health Maintenance  Topic Date Due   Zoster Vaccines- Shingrix (1 of 2) Never done   Diabetic kidney evaluation - Urine ACR  03/28/2017   DTaP/Tdap/Td (2 - Tdap) 11/27/2019   OPHTHALMOLOGY EXAM  05/03/2020   COVID-19 Vaccine (4 - 2023-24 season) 12/04/2022 (Originally 03/14/2022)   DEXA SCAN  05/21/2023 (Originally 08/21/2012)   INFLUENZA VACCINE  02/12/2023   HEMOGLOBIN A1C  03/10/2023   FOOT EXAM  05/02/2023   Diabetic kidney evaluation - eGFR measurement  05/21/2023   Fecal DNA (Cologuard)  08/06/2023   Medicare Annual Wellness (AWV)  09/04/2023   Pneumonia Vaccine 40+ Years old  Completed   Hepatitis C Screening  Completed   HPV VACCINES  Aged Out   COLONOSCOPY (Pts 45-61yrs Insurance coverage will need to be confirmed)  Discontinued      Primary hypertension -     Lipid panel -     CBC with Differential/Platelet -     Comprehensive metabolic panel  Hyperlipidemia associated with type 2 diabetes mellitus (HCC) -     Lipid panel -     CBC with Differential/Platelet -     Comprehensive metabolic panel  Type 2 diabetes mellitus with stage 3b chronic kidney disease, without long-term current use of insulin (HCC) -     Microalbumin / creatinine urine ratio  B12 deficiency  Vitamin D deficiency  Estrogen deficiency -     DG Bone Density; Future  Mixed hyperlipidemia -     Atorvastatin Calcium; Take 1 tablet (80 mg total) by mouth daily.  Dispense: 90 tablet; Refill: 3  Essential hypertension, benign Assessment & Plan: Well controlled, no changes to meds. Encouraged heart healthy diet such as the DASH diet and exercise as tolerated.    Orders: -     Atorvastatin Calcium; Take 1 tablet (80 mg total) by mouth daily.  Dispense: 90 tablet; Refill: 3  MURMUR -      Atorvastatin Calcium; Take 1 tablet (80 mg total) by mouth daily.  Dispense: 90 tablet; Refill: 3  Aortic atherosclerosis (HCC) Assessment & Plan: Check labs  Cholesterol control important   Orders: -     Atorvastatin Calcium; Take 1 tablet (80 mg total) by mouth daily.  Dispense: 90 tablet; Refill: 3  DOE (dyspnea on exertion) -     Atorvastatin Calcium; Take 1 tablet (80 mg total) by mouth daily.  Dispense: 90 tablet; Refill: 3  Coronary artery calcification -     Atorvastatin  Calcium; Take 1 tablet (80 mg total) by mouth daily.  Dispense: 90 tablet; Refill: 3  Depression, unspecified depression type -     Citalopram Hydrobromide; TAKE 1 TABLET EVERY DAY  Dispense: 90 tablet; Refill: 3  Acute non-recurrent pansinusitis -     Fluticasone Propionate; Place 2 sprays into both nostrils daily.  Dispense: 16 g; Refill: 6  Essential hypertension -     Lisinopril-hydroCHLOROthiazide; Take 1 tablet by mouth daily.  Dispense: 90 tablet; Refill: 3  low Miller pain with radiation -     Meloxicam; Take 1 tablet (7.5 mg total) by mouth daily.  Dispense: 90 tablet; Refill: 3  Gastroesophageal reflux disease -     Omeprazole; Take 1 capsule (20 mg total) by mouth daily.  Dispense: 90 capsule; Refill: 1  Insomnia, unspecified type -     traZODone HCl; Take 0.5-1 tablets (25-50 mg total) by mouth at bedtime as needed. for sleep  Dispense: 90 tablet; Refill: 1  ANXIETY Assessment & Plan: Stable-- she is under a lot of stress--- her son has been having some marital problems and hip pain    Hyperlipidemia LDL goal <70 Assessment & Plan: Tolerating statin, encouraged heart healthy diet, avoid trans fats, minimize simple carbs and saturated fats. 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  All medical record  entries made by the scribe were at my direction and in my presence.  I have reviewed the chart and discharge instructions (if applicable) and agree that the record reflects my personal performance and is accurate and complete. 11/18/2022   I,Shehryar Baig,acting as a scribe for Donato Schultz, DO.,have documented all relevant documentation on the behalf of Donato Schultz, DO,as directed by  Donato Schultz, DO while in the presence of Donato Schultz, DO.   Donato Schultz, DO

## 2022-11-18 NOTE — Assessment & Plan Note (Signed)
Check labs  Cholesterol control important

## 2022-11-24 ENCOUNTER — Encounter: Payer: Self-pay | Admitting: Internal Medicine

## 2022-11-24 MED ORDER — METFORMIN HCL ER 500 MG PO TB24: 500.0000 mg | ORAL_TABLET | Freq: Two times a day (BID) | ORAL | 3 refills | Status: DC

## 2022-11-26 ENCOUNTER — Inpatient Hospital Stay (HOSPITAL_BASED_OUTPATIENT_CLINIC_OR_DEPARTMENT_OTHER): Admission: RE | Admit: 2022-11-26 | Payer: Medicare Other | Source: Ambulatory Visit

## 2022-11-26 ENCOUNTER — Other Ambulatory Visit (HOSPITAL_BASED_OUTPATIENT_CLINIC_OR_DEPARTMENT_OTHER): Payer: Medicare Other

## 2022-12-24 ENCOUNTER — Encounter (HOSPITAL_BASED_OUTPATIENT_CLINIC_OR_DEPARTMENT_OTHER): Payer: Self-pay

## 2022-12-24 ENCOUNTER — Ambulatory Visit (HOSPITAL_BASED_OUTPATIENT_CLINIC_OR_DEPARTMENT_OTHER)
Admission: RE | Admit: 2022-12-24 | Discharge: 2022-12-24 | Disposition: A | Discharge status: Home / Self Care | Payer: Medicare Other | Source: Ambulatory Visit | Attending: Family Medicine | Admitting: Family Medicine

## 2022-12-24 DIAGNOSIS — E2839 Other primary ovarian failure: Secondary | ICD-10-CM | POA: Insufficient documentation

## 2022-12-24 DIAGNOSIS — Z1231 Encounter for screening mammogram for malignant neoplasm of breast: Secondary | ICD-10-CM | POA: Diagnosis present

## 2022-12-31 ENCOUNTER — Other Ambulatory Visit: Payer: Self-pay

## 2022-12-31 MED ORDER — ALENDRONATE SODIUM 70 MG PO TABS: 70.0000 mg | ORAL_TABLET | ORAL | 3 refills | Status: DC

## 2023-01-23 ENCOUNTER — Other Ambulatory Visit: Payer: Self-pay | Admitting: Family Medicine

## 2023-01-23 DIAGNOSIS — G4700 Insomnia, unspecified: Secondary | ICD-10-CM

## 2023-03-10 ENCOUNTER — Encounter: Payer: Self-pay | Admitting: Internal Medicine

## 2023-03-10 ENCOUNTER — Ambulatory Visit: Payer: Medicare Other | Admitting: Internal Medicine

## 2023-03-10 VITALS — BP 138/70 | HR 74 | Ht 62.0 in | Wt 196.0 lb

## 2023-03-10 DIAGNOSIS — E1122 Type 2 diabetes mellitus with diabetic chronic kidney disease: Secondary | ICD-10-CM

## 2023-03-10 DIAGNOSIS — N1832 Chronic kidney disease, stage 3b: Secondary | ICD-10-CM

## 2023-03-10 DIAGNOSIS — E785 Hyperlipidemia, unspecified: Secondary | ICD-10-CM | POA: Diagnosis not present

## 2023-03-10 DIAGNOSIS — Z794 Long term (current) use of insulin: Secondary | ICD-10-CM

## 2023-03-10 DIAGNOSIS — Z7984 Long term (current) use of oral hypoglycemic drugs: Secondary | ICD-10-CM

## 2023-03-10 DIAGNOSIS — Z7985 Long-term (current) use of injectable non-insulin antidiabetic drugs: Secondary | ICD-10-CM

## 2023-03-10 DIAGNOSIS — E669 Obesity, unspecified: Secondary | ICD-10-CM

## 2023-03-10 LAB — POCT GLYCOSYLATED HEMOGLOBIN (HGB A1C): Hemoglobin A1C: 8.8 % — AB (ref 4.0–5.6)

## 2023-03-10 MED ORDER — FREESTYLE LIBRE 3 SENSOR MISC: 1.0000 | 3 refills | Status: DC

## 2023-03-10 MED ORDER — FREESTYLE LIBRE 3 READER DEVI: 1.0000 | Freq: Once | 0 refills | Status: AC

## 2023-03-10 NOTE — Patient Instructions (Addendum)
Please continue: - Metformin 1000 mg 2x a day  Increase: - Lantus 18 units daily - Trulicity 3 mg weekly   Please return in 3 months with your sugar log.

## 2023-03-10 NOTE — Addendum Note (Signed)
Addended by: Pollie Meyer on: 03/10/2023 03:18 PM   Modules accepted: Orders

## 2023-03-10 NOTE — Progress Notes (Signed)
Patient ID: Bethany Miller, female   DOB: [birth year], 77 y.o.   MRN: [id number]  HPI: Bethany Miller is a 77 y.o.-year-old female, presenting for f/u for DM2, dx 2010, insulin-dependent, uncontrolled, with complications (CKD).  Last visit 6 months ago. She has [employer] part D for meds, [employer], and [employer] for the rest.   Interim history: No increased urination, chest pain.  She had blurry vision after her cataract surgery. She lost 2 of her siblings in July: Her older sister and younger brother. She had cataract sx >> on steroid drops >> sugars higher.  Reviewed HbA1c levels: Lab Results  Component Value Date   HGBA1C 6.6 (A) 09/09/2022   HGBA1C 7.0 (A) 05/01/2022   HGBA1C 6.9 (A) 01/21/2022   HGBA1C 6.3 (A) 09/03/2021   HGBA1C 7.3 (A) 04/16/2021   HGBA1C 7.0 (A) 12/07/2020   HGBA1C 8.1 (A) 08/31/2020   HGBA1C 7.4 (A) 02/28/2020   HGBA1C 9.2 (H) 10/06/2019   HGBA1C 8.6 (H) 04/01/2019   HGBA1C 10.4 (H) 11/30/2018   HGBA1C 9.5 (A) 08/26/2018   HGBA1C 9.8 (A) 03/31/2018   HGBA1C 8.4 11/26/2017   HGBA1C 7.0 07/30/2017   HGBA1C 7.6 (H) 04/21/2017   HGBA1C 7.4 (H) 03/31/2017   HGBA1C 8.5 (H) 02/17/2017   HGBA1C 8.6 (H) 11/11/2016   HGBA1C 7.9% 07/03/2016   Previously on: - Metformin ER 1000 mg 2x a day (2000 mg with dinner did not change the sugars) >> 1000 mg with breakfast - Glipizide 5 >> 10 mg 2x a day before breakfast and dinner - >> stopped 02/2020 by Dr. Lonzo Cloud We tried Ozempic but she developed nausea and vomiting and ended up in the emergency room in 11/2018. We again tried [place]  - added again 04/2018 >> not affordable Stopped  Glipizide 5 mg 2x a day 06/2017 b/c lows, but restarted afterwards. Tradjenta and Alogliptin were not covered She was previously on Invokana, Jardiance 10 mg daily >> too expensive. Tried Cycloset >> too expensive Tried Januvia >>  too expensive.  Tried Metformin 500 mg po >> severe diarrhea. She was on Glimepiride >>  hypoglycemia in the 60's on 2 mg daily. She had rapid acting insulin with steroid shots before.   Now on: - Metformin ER 500 mg 2x a day >> .Marland KitchenMarland Kitchen500 mg in am >> 500 mg 2-4x a day (occas. diarrhea) - Lantus 10-12 units in HS - added Miller 04/2021 >> 12 >> 15 >> 10-12 >> 15 units daily - Trulicity 1.5 mg weekly (PAP) >> 0.75 mg Stopped Lantus ~09/2020 >> restarted. Previously on glipizide 5 mg before a larger dinner. She was previously on Jardiance but this caused increased urination and vulvar irritation so she stopped.  Pt checks her sugars twice a day: - am:  88-140, ave 110-120, 188 >> 90s-128, 130, 169 - cantaloupe >> 153-200s - 2h after b'fast: 97, 164, 173 >> n/c - Lunch:  low 100s >> n/c >> 163 >> n/c - 2h after lunch: 160 >> n/c  - Dinnertime: 122, 129 >> 90-125 >> n/c >> 90s-110 >> n/c - 2h after dinner: 99, 200 >> 97-130 >> n/c >> 130-140 >> 150-200 - nighttime: 368 >> n/c >> 130 >> n/c Lowest sugar was 56 x1 (on Trulicity - 3 mg weekly from a neighbor) >> 88 >> 90 >> 91;  she has hypoglycemia awareness in the 90s >> 70s. Highest sugar was 180 (sick) >> 360 >> 188 (raisins in chocolate) >> 169 >> upper 200  ReliOn meter.  -+  CKD, last BUN/creatinine:  Lab Results  Component Value Date   BUN 19 11/18/2022   CREATININE 1.19 11/18/2022   No MAU: Lab Results  Component Value Date   MICRALBCREAT 1.0 11/18/2022   MICRALBCREAT 4 03/28/2016   MICRALBCREAT 0.6 11/16/2015   MICRALBCREAT 0.6 01/26/2013   MICRALBCREAT 0.3 03/24/2012   MICRALBCREAT 0.4 03/18/2011   MICRALBCREAT 11.4 10/29/2009   MICRALBCREAT 3.4 04/09/2009   MICRALBCREAT 17.1 01/25/2009  06/03/2021 (CCK): <4  GFR: Lab Results  Component Value Date   GFRNONAA 29 (L) 11/25/2018   GFRNONAA 45 (L) 04/21/2017   GFRNONAA 54 (L) 02/27/2017   GFRNONAA 35 (L) 02/17/2017   GFRNONAA 52.79 (L) 06/19/2010   GFRNONAA 59.68 10/29/2009   GFRNONAA 53.52 07/03/2009   GFRNONAA 59.78 04/09/2009   GFRNONAA 59.82 01/25/2009    GFRNONAA 78 05/08/2008  On Lisinopril 10.  -+ HL; last set of lipids: Lab Results  Component Value Date   CHOL 146 11/18/2022   HDL 40.60 11/18/2022   LDLCALC 78 11/18/2022   LDLDIRECT 149.6 01/26/2013   TRIG 134.0 11/18/2022   CHOLHDL 4 11/18/2022  On Lipitor 40, Fenofibrate 160.  - last eye exam was in 2024: No DR reportedly. She had cataract surgery OS in 01/2023.  - no numbness and tingling in her feet.  She sees podiatry for ingrown toenails.  Last foot exam 05/01/2022.  She also has a history of HTN. 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  ROS: + See HPI  I reviewed pt's medications, allergies, PMH, social hx, family hx, and changes were documented in the history of present illness. Otherwise, unchanged from my initial visit note.  Past Medical History:  Diagnosis Date   Diabetes mellitus    Hyperlipidemia    Hypertension    Murmur    Past Surgical History:  Procedure Laterality Date   ABDOMINAL HYSTERECTOMY     Miller SURGERY     CHOLECYSTECTOMY     TONSILLECTOMY     TOTAL KNEE ARTHROPLASTY     TOTAL KNEE ARTHROPLASTY Right 02/26/2017   Procedure: RIGHT TOTAL KNEE ARTHROPLASTY;  Surgeon: Jene Every, MD;  Location: [facility];  Service: Orthopedics;  Laterality: Right;   Social History   Socioeconomic History   Marital status: Widowed    Spouse name: Not on file   Number of children: Not on file   Years of education: Not on file   Highest education level: Not on file  Occupational History   Not on file  Tobacco Use   Smoking status: Never   Smokeless tobacco: Never  Substance and Sexual Activity   Alcohol use: Yes    Comment: rare   Drug use: No   Sexual activity: Not Currently    Partners: Male  Other Topics Concern   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   Caffeine use: coffee in the am   Social Determinants of Health   Financial Resource Strain: Low Risk  (09/02/2022)   Overall Financial Resource Strain  (CARDIA)    Difficulty of Paying Living Expenses: Not very hard  Food Insecurity: No Food Insecurity (09/02/2022)   Hunger Vital Sign    Worried About Running Out of Food in the Last Year: Never true    Ran Out of Food in the Last Year: Never true  Transportation Needs: No Transportation Needs (09/02/2022)   PRAPARE - Administrator, Civil Service (Medical): No    Lack of Transportation (Non-Medical):  No  Physical Activity: Insufficiently Active (09/02/2022)   Exercise Vital Sign    Days of Exercise per Week: 2 days    Minutes of Exercise per Session: 30 min  Stress: No Stress Concern Present (09/02/2022)   [employer] of Occupational Health - Occupational Stress Questionnaire    Feeling of Stress : Only a little  Social Connections: Unknown (11/13/2022)   [facility]   Social Network    Social Network: Not on file  Intimate Partner Violence: Not At Risk (01/13/2023)   Received from [facility], [facility]   HITS    Over the last 12 months how often did your partner physically hurt you?: 1    Over the last 12 months how often did your partner insult you or talk down to you?: 1    Over the last 12 months how often did your partner threaten you with physical harm?: 1    Over the last 12 months how often did your partner scream or curse at you?: 1   Current Outpatient Medications on File Prior to Visit  Medication Sig Dispense Refill   acetaminophen (TYLENOL) 650 MG CR tablet Take 650 mg by mouth every 8 (eight) hours as needed for pain.     alendronate (FOSAMAX) 70 MG tablet Take 1 tablet (70 mg total) by mouth every 7 (seven) days. Take with a full glass of water on an empty stomach. 12 tablet 3   Ascorbic Acid (VITAMIN C) 100 MG tablet Take 100 mg by mouth daily.     aspirin EC 81 MG tablet Take 81 mg by mouth daily.     atorvastatin (LIPITOR) 80 MG tablet Take 1 tablet (80 mg total) by mouth daily. 90 tablet 3   cholecalciferol  (VITAMIN D3) 25 MCG (1000 UNIT) tablet Take 2,000 Units by mouth daily.     citalopram (CELEXA) 40 MG tablet TAKE 1 TABLET EVERY DAY 90 tablet 3   docusate sodium (COLACE) 100 MG capsule Take 1 capsule (100 mg total) by mouth 2 (two) times daily as needed for mild constipation. 30 capsule 1   Dulaglutide (TRULICITY) 0.75 MG/0.5ML SOPN Inject 0.75 mg into the skin once a week. 6 mL 3   Dulaglutide (TRULICITY) 1.5 MG/0.5ML SOPN Inject 1.5 mg into the skin once a week. 8 mL 2   fluticasone (FLONASE) 50 MCG/ACT nasal spray Place 2 sprays into both nostrils daily. 16 g 6   glucose blood (ACCU-CHEK GUIDE) test strip USE 1 STRIP TO CHECK GLUCOSE ONCE DAILY 100 each 4   Insulin Admin Supplies MISC Use to inject insulin 2 times a day. 180 each 0   insulin glargine (LANTUS SOLOSTAR) 100 UNIT/ML Solostar Pen Inject 10-15 Units into the skin daily. 30 mL 3   insulin glargine (LANTUS) 100 UNIT/ML injection Inject 0.1-0.15 mLs (10-15 Units total) into the skin daily. 10 mL 11   Insulin Pen Needle 32G X 4 MM MISC Use 1x a day 100 each 3   Lancets (ACCU-CHEK MULTICLIX) lancets Use as instructed to test once a day DX E11.51 100 each 1   lisinopril-hydrochlorothiazide (ZESTORETIC) 10-12.5 MG tablet Take 1 tablet by mouth daily. 90 tablet 3   Loratadine-Pseudoephedrine (CLARITIN-D 12 HOUR PO) Take 1 tablet by mouth daily as needed (sinus).      meloxicam (MOBIC) 7.5 MG tablet Take 1 tablet (7.5 mg total) by mouth daily. 90 tablet 3   metFORMIN (GLUCOPHAGE-XR) 500 MG 24 hr tablet Take 1 tablet (500  mg total) by mouth 2 (two) times daily. 180 tablet 3   Multiple Vitamins-Minerals (MULTIVITAMIN ADULT PO) Take 1 tablet by mouth daily.      omeprazole (PRILOSEC) 20 MG capsule Take 1 capsule (20 mg total) by mouth daily. 90 capsule 1   traZODone (DESYREL) 50 MG tablet Take 0.5-1 tablets (25-50 mg total) by mouth at bedtime as needed. for sleep 90 tablet 1   vitamin B-12 (CYANOCOBALAMIN) 100 MCG tablet Take 100 mcg by  mouth daily.     zinc gluconate 50 MG tablet Take 50 mg by mouth daily.     No current facility-administered medications on file prior to visit.   Allergies  Allergen Reactions   Ozempic (0.25 Or 0.5 Mg-Dose) [Semaglutide(0.25 Or 0.5mg -Dos)] Diarrhea and Nausea And Vomiting    Pt stated, "I got dehydrated from this as well"   Family History  Problem Relation Age of Onset   Throat cancer Brother    Heart disease Brother        MI   Cancer Brother 80       throat,    Hyperlipidemia Brother    Cirrhosis Father    Alcohol abuse Father    Heart disease Brother    Cancer Brother    Arthritis Mother    Hypertension Sister    Arthritis Sister    Alzheimer's disease Sister    Cancer Brother    Cancer Brother 50       melanoma   Hypertension Brother    Hyperlipidemia Brother    Melanoma Other    Arthritis Other    PE: BP 138/70   Pulse 74   Ht 5\' 2"  (1.575 m)   Wt 196 lb (88.9 kg)   SpO2 93%   BMI 35.85 kg/m  Wt Readings from Last 3 Encounters:  03/10/23 196 lb (88.9 kg)  11/18/22 195 lb 6.4 oz (88.6 kg)  09/09/22 192 lb 9.6 oz (87.4 kg)   Constitutional: overweight, in NAD Eyes:EOMI, no exophthalmos ENT: no thyromegaly, no cervical lymphadenopathy Cardiovascular: RRR, No MRG, B LE edema  Respiratory: CTA B Musculoskeletal: no deformities Skin: no rashes Neurological: no tremor with outstretched hands Diabetic Foot Exam - Simple   Simple Foot Form Diabetic Foot exam was performed with the following findings: Yes 03/10/2023 11:03 AM  Visual Inspection No deformities, no ulcerations, no other skin breakdown bilaterally: Yes Sensation Testing Intact to touch and monofilament testing bilaterally: Yes Pulse Check Posterior Tibialis and Dorsalis pulse intact bilaterally: Yes Comments    ASSESSMENT: 1. DM2, insulin-dependent, uncontrolled, with long-term complications: - CKD  2. HL   3. Obesity class II  PLAN:  1. Patient with history of uncontrolled type 2  diabetes, on oral medication with metformin,, weekly GLP-1 receptor agonist and daily long-acting insulin, with fair control.  At last visit, HbA1c was lower, at 6.6%, decreased from 7.0%.  Sugars are almost all at goal, with few hyperglycemic exceptions after dietary indiscretions.  We did not change her regimen.  She wanted me to print her prescription for Lantus vial to see if this would be cheaper for her. -At today's visit, sugars are higher, while up to the 200s.  Upon questioning, she had significant stress since last visit with 2 of her siblings passing away in 01/2023.  She also had cataract surgery last month and has been on steroid drops, which, we discussed, can increase her blood sugars.  She has increased her metformin dose and also her Lantus dose but unfortunately she  is taking only a low-dose Trulicity due to the lack of availability.  In the last week, she doubled up on the dose (1.5 mg weekly) and she thinks she is better blood sugars.  She tolerates the dose well.  At today's visit I advised her to increase the dose of Lantus (but she may Miller off after the sugars start improving) and also increase the dose of Trulicity to 3 mg weekly.  We resubmitted her patient assistance application for this dose. - I suggested to:  Patient Instructions  Please continue: - Metformin 1000 mg 2x a day  Increase: - Lantus 18 units daily - Trulicity 3 mg weekly   Please return in 3 months with your sugar log.   - we checked her HbA1c: 8.8% (higher) - advised to check sugars at different times of the day - 1x a day, rotating check times - advised for yearly eye exams >> she is UTD - return to clinic in 3 months   2. HL -Reviewed lipid panel from 11/2022: LDL slightly above our target of less than 70, otherwise fractions at goal: Lab Results  Component Value Date   CHOL 146 11/18/2022   HDL 40.60 11/18/2022   LDLCALC 78 11/18/2022   LDLDIRECT 149.6 01/26/2013   TRIG 134.0 11/18/2022    CHOLHDL 4 11/18/2022  -She is on Lipitor 40 mg daily and fenofibrate 160 mg daily without side effects  3.  Obesity class II -She could not tolerate Ozempic due to nausea and vomiting but she is tolerating Trulicity well.  This was not covered for her but now obtaining it with the patient assistance program.   -She lost 12 pounds before the last 2 visits combined -Since last visit she gained 4 pounds -we are increasing the dose of her Trulicity  Carlus Pavlov, MD PhD [facility]

## 2023-04-06 ENCOUNTER — Encounter: Payer: Self-pay | Admitting: Internal Medicine

## 2023-04-06 MED ORDER — TRULICITY 3 MG/0.5ML ~~LOC~~ SOAJ: 3.0000 mg | SUBCUTANEOUS | 3 refills | Status: DC

## 2023-04-25 ENCOUNTER — Other Ambulatory Visit: Payer: Self-pay | Admitting: Family Medicine

## 2023-04-25 DIAGNOSIS — K219 Gastro-esophageal reflux disease without esophagitis: Secondary | ICD-10-CM

## 2023-04-30 ENCOUNTER — Other Ambulatory Visit: Payer: Self-pay | Admitting: Family Medicine

## 2023-04-30 DIAGNOSIS — G4700 Insomnia, unspecified: Secondary | ICD-10-CM

## 2023-05-08 IMAGING — DX DG FOOT COMPLETE 3+V*R*
3 series · 3 of 3 positions shown · non-contrast
Comparison: None.

CLINICAL DATA: Pain in  posterior RIGHT heel

EXAM:
RIGHT FOOT COMPLETE - 3+ VIEW

[foot ap wb]
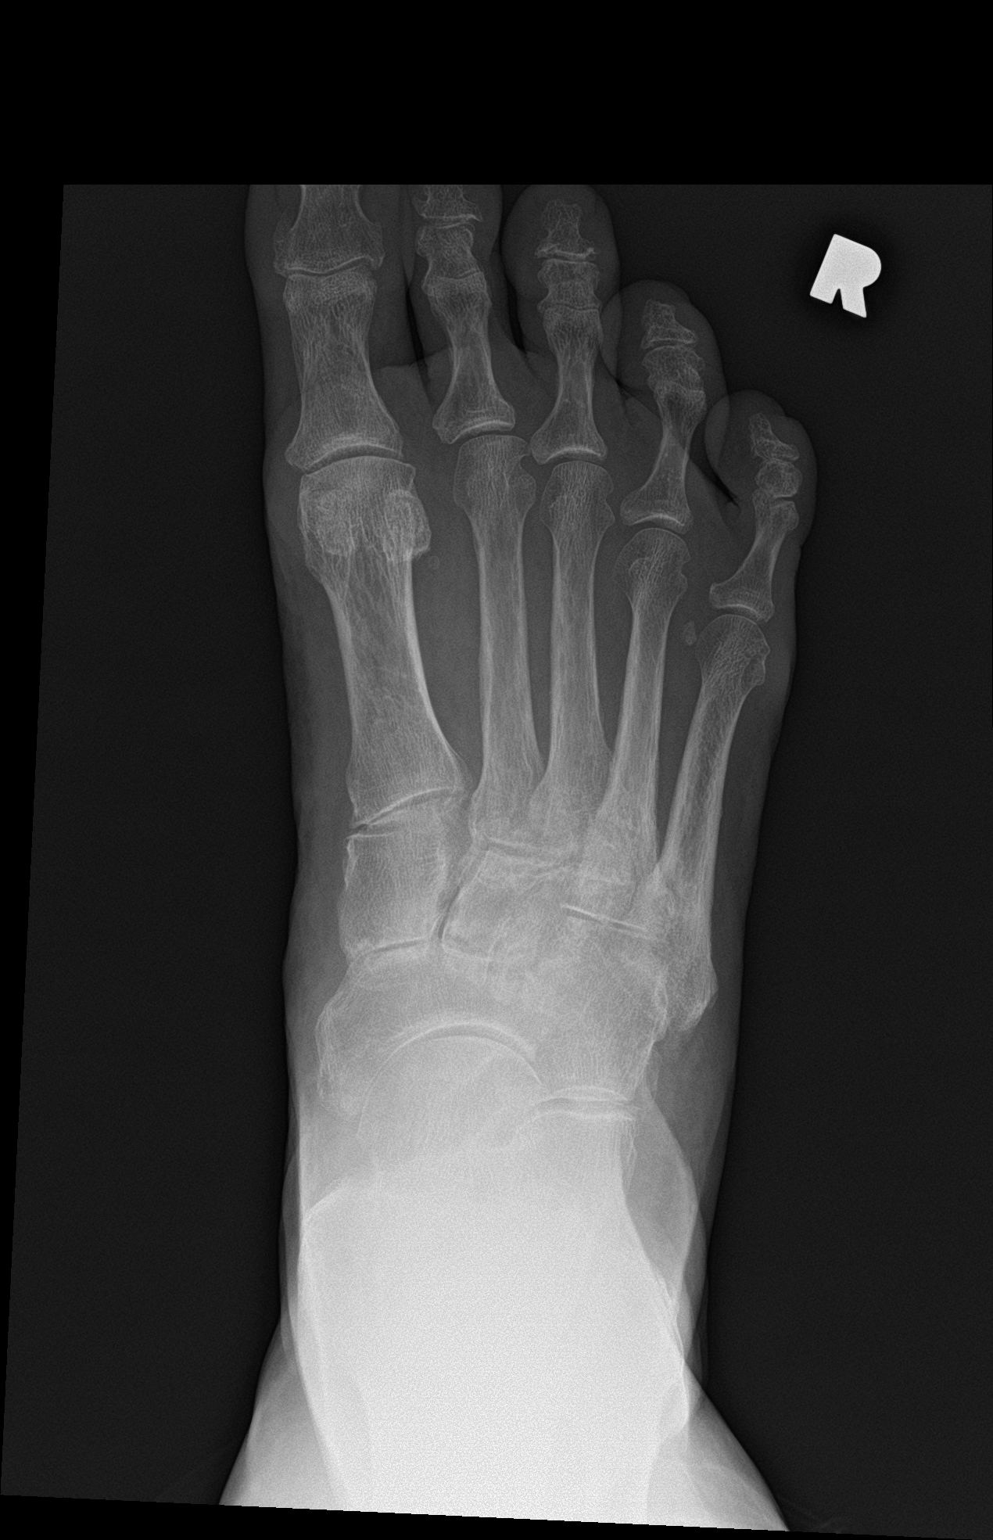

[foot obl wb]
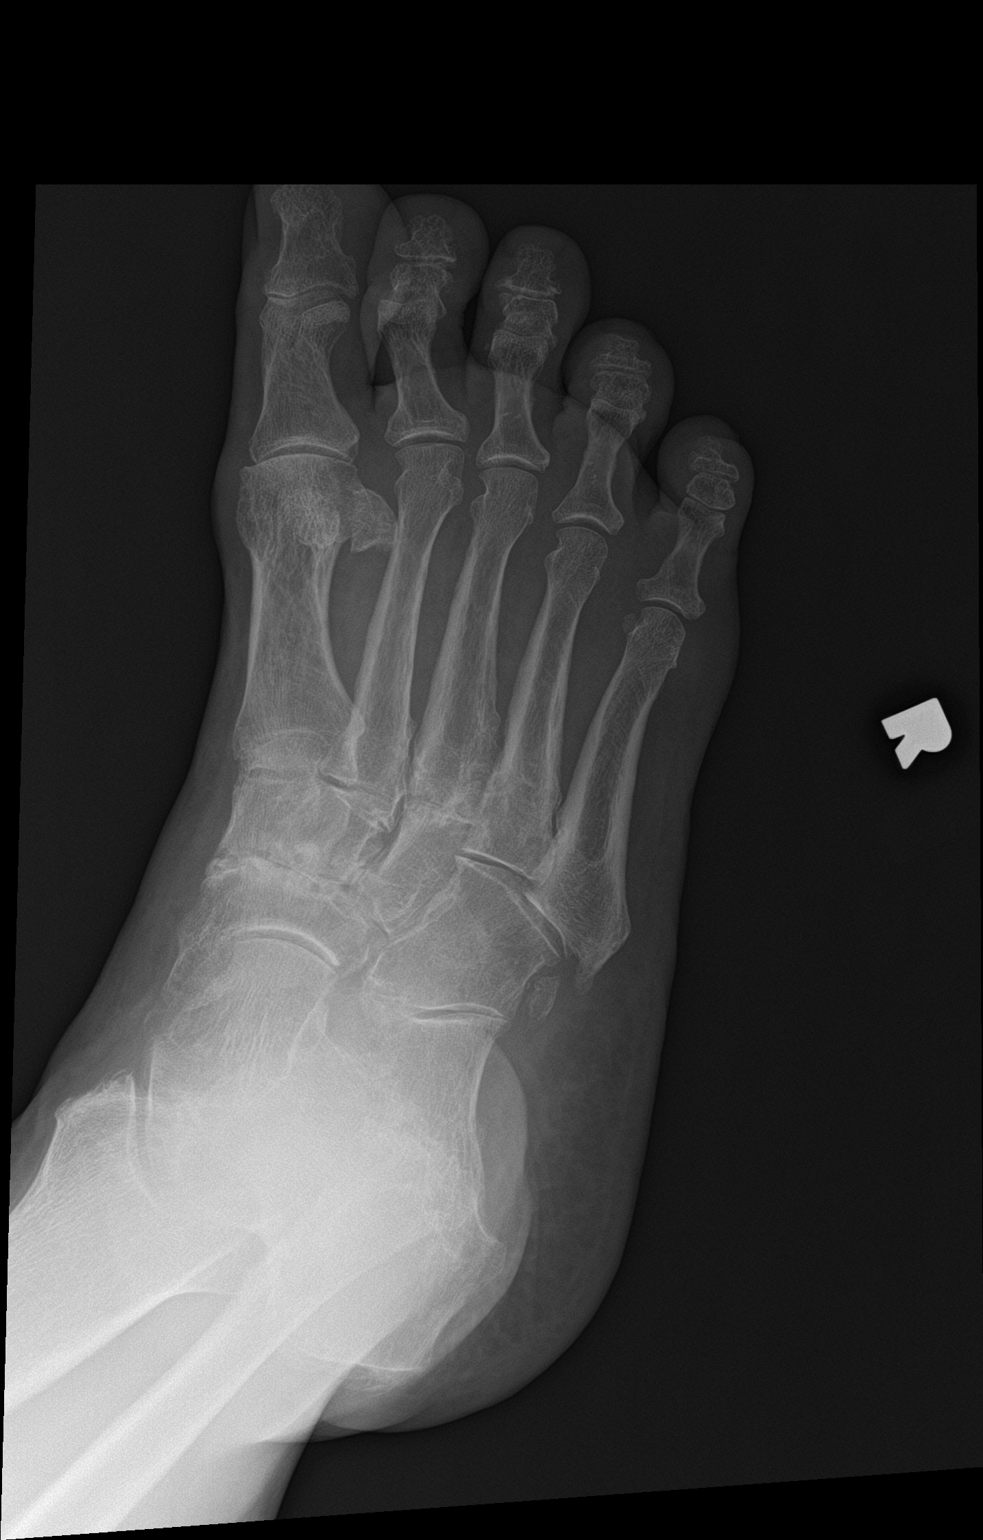

[foot lat wb]
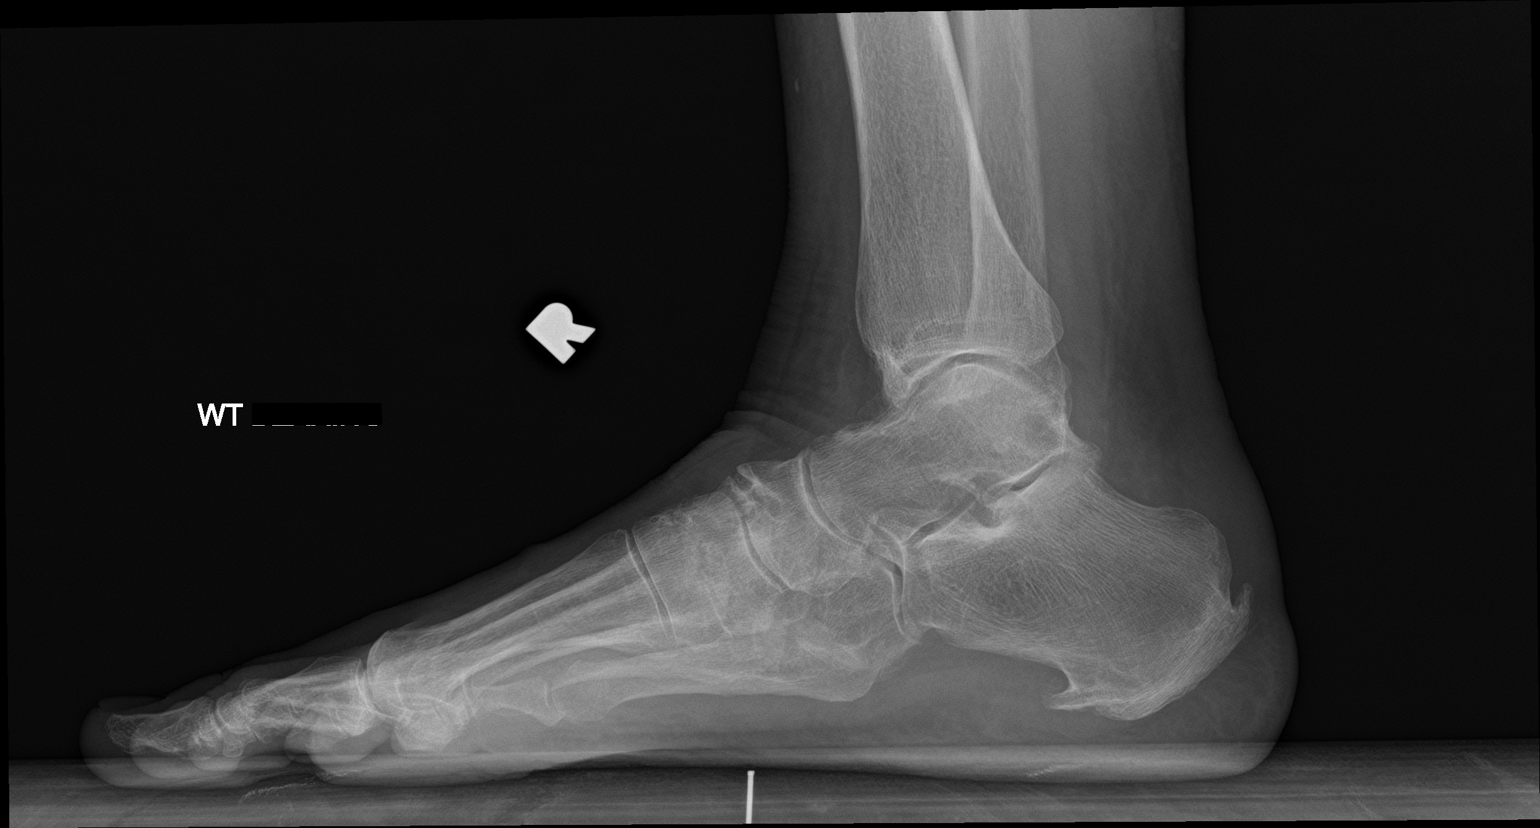

[3 of 3 positions shown; findings below may reference images not displayed]

FINDINGS: No fracture or dislocation of mid foot or forefoot. The phalanges
are normal. The calcaneus is normal. No soft tissue abnormality.

Enthesopathic spurring along the plantar aspect of the calcaneus as
well as the Achilles insertion.
IMPRESSION: Enthesopathic spurring of the calcaneus.

## 2023-05-14 ENCOUNTER — Encounter: Payer: Self-pay | Admitting: Family Medicine

## 2023-05-14 ENCOUNTER — Ambulatory Visit: Payer: Medicare Other | Admitting: Family Medicine

## 2023-05-14 VITALS — BP 122/68 | HR 78 | Temp 97.8°F | Resp 18 | Ht 62.0 in | Wt 192.0 lb

## 2023-05-14 DIAGNOSIS — N1832 Chronic kidney disease, stage 3b: Secondary | ICD-10-CM

## 2023-05-14 DIAGNOSIS — E1122 Type 2 diabetes mellitus with diabetic chronic kidney disease: Secondary | ICD-10-CM

## 2023-05-14 DIAGNOSIS — E785 Hyperlipidemia, unspecified: Secondary | ICD-10-CM

## 2023-05-14 DIAGNOSIS — I700 Atherosclerosis of aorta: Secondary | ICD-10-CM

## 2023-05-14 DIAGNOSIS — I10 Essential (primary) hypertension: Secondary | ICD-10-CM

## 2023-05-14 DIAGNOSIS — E538 Deficiency of other specified B group vitamins: Secondary | ICD-10-CM

## 2023-05-14 DIAGNOSIS — Z7985 Long-term (current) use of injectable non-insulin antidiabetic drugs: Secondary | ICD-10-CM

## 2023-05-14 DIAGNOSIS — E1169 Type 2 diabetes mellitus with other specified complication: Secondary | ICD-10-CM

## 2023-05-14 DIAGNOSIS — E559 Vitamin D deficiency, unspecified: Secondary | ICD-10-CM

## 2023-05-14 DIAGNOSIS — E1142 Type 2 diabetes mellitus with diabetic polyneuropathy: Secondary | ICD-10-CM

## 2023-05-14 LAB — CBC WITH DIFFERENTIAL/PLATELET
Basophils Absolute: 0 10*3/uL (ref 0.0–0.1)
Basophils Relative: 0.4 % (ref 0.0–3.0)
Eosinophils Absolute: 0.1 10*3/uL (ref 0.0–0.7)
Eosinophils Relative: 1.6 % (ref 0.0–5.0)
HCT: 37.6 % (ref 36.0–46.0)
Hemoglobin: 12.2 g/dL (ref 12.0–15.0)
Lymphocytes Relative: 16.2 % (ref 12.0–46.0)
Lymphs Abs: 1.3 10*3/uL (ref 0.7–4.0)
MCHC: 32.5 g/dL (ref 30.0–36.0)
MCV: 90.2 fL (ref 78.0–100.0)
Monocytes Absolute: 0.5 10*3/uL (ref 0.1–1.0)
Monocytes Relative: 6.8 % (ref 3.0–12.0)
Neutro Abs: 5.8 10*3/uL (ref 1.4–7.7)
Neutrophils Relative %: 75 % (ref 43.0–77.0)
Platelets: 225 10*3/uL (ref 150.0–400.0)
RBC: 4.17 Mil/uL (ref 3.87–5.11)
RDW: 14.6 % (ref 11.5–15.5)
WBC: 7.8 10*3/uL (ref 4.0–10.5)

## 2023-05-14 LAB — COMPREHENSIVE METABOLIC PANEL
ALT: 15 U/L (ref 0–35)
AST: 16 U/L (ref 0–37)
Albumin: 4 g/dL (ref 3.5–5.2)
Alkaline Phosphatase: 99 U/L (ref 39–117)
BUN: 23 mg/dL (ref 6–23)
CO2: 29 meq/L (ref 19–32)
Calcium: 9.3 mg/dL (ref 8.4–10.5)
Chloride: 101 meq/L (ref 96–112)
Creatinine, Ser: 1.32 mg/dL — ABNORMAL HIGH (ref 0.40–1.20)
GFR: 39.43 mL/min — ABNORMAL LOW (ref >60.00)
Glucose, Bld: 140 mg/dL — ABNORMAL HIGH (ref 70–99)
Potassium: 4.5 meq/L (ref 3.5–5.1)
Sodium: 138 meq/L (ref 135–145)
Total Bilirubin: 0.7 mg/dL (ref 0.2–1.2)
Total Protein: 6.6 g/dL (ref 6.0–8.3)

## 2023-05-14 LAB — LIPID PANEL
Cholesterol: 109 mg/dL (ref 0–200)
HDL: 34.6 mg/dL — ABNORMAL LOW (ref >39.00)
LDL Cholesterol: 49 mg/dL (ref 0–99)
NonHDL: 74.22
Total CHOL/HDL Ratio: 3
Triglycerides: 126 mg/dL (ref 0.0–149.0)
VLDL: 25.2 mg/dL (ref 0.0–40.0)

## 2023-05-14 LAB — HEMOGLOBIN A1C: Hgb A1c MFr Bld: 8.6 % — ABNORMAL HIGH (ref 4.6–6.5)

## 2023-05-14 LAB — MICROALBUMIN / CREATININE URINE RATIO
Creatinine,U: 143.9 mg/dL
Microalb Creat Ratio: 1 mg/g (ref 0.0–30.0)
Microalb, Ur: 1.4 mg/dL (ref 0.0–1.9)

## 2023-05-14 LAB — VITAMIN D 25 HYDROXY (VIT D DEFICIENCY, FRACTURES): VITD: 49 ng/mL (ref 30.00–100.00)

## 2023-05-14 LAB — VITAMIN B12: Vitamin B-12: 720 pg/mL (ref 211–911)

## 2023-05-14 NOTE — Assessment & Plan Note (Signed)
hgba1c to be checked, minimize simple carbs. Increase exercise as tolerated. Continue current meds  

## 2023-05-14 NOTE — Progress Notes (Signed)
Established Patient Office Visit  Subjective   Patient ID: Bethany Miller, female    DOB: Jan 22, 1946  Age: 77 y.o. MRN: [id number]  Chief Complaint  Patient presents with   Hyperlipidemia   Hypertension   Follow-up    HPI Discussed the use of AI scribe software for clinical note transcription with the patient, who gave verbal consent to proceed.  History of Present Illness   The patient, with a history of diabetes, presents with a toe injury, uncontrolled diabetes, and ear discomfort. She dropped a glass candle on her toe, causing bruising and pain, but no open sores. She has been active despite the injury. She has been experiencing high blood sugar levels, with her A1c increasing from 6.2 to 8.2. She attributes this to high stress events, including cataract surgery and the loss of family members. Despite taking Trulicity, her blood sugar levels remain high, often reaching 250 in the morning. She has also been experiencing ear discomfort, describing it as feeling like water is in her ear, causing temporary hearing loss and discomfort. This has been occurring intermittently.      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 12/06/2021   Other fatigue 03/08/2021   Numbness and tingling 03/08/2021   Dysuria 09/17/2020   Type 2 diabetes mellitus with diabetic polyneuropathy, without long-term current use of insulin (HCC) 02/29/2020   Uncontrolled type 2 diabetes mellitus with hyperglycemia (HCC) 02/29/2020   Type 2 diabetes mellitus with stage 3b chronic kidney disease, without long-term current use of insulin (HCC) 02/29/2020   Mixed hyperlipidemia 01/31/2020   Weakness 01/31/2020   Hip pain, bilateral 01/31/2020   low back pain with radiation 01/31/2020    Stiffness of finger joint of right hand 08/30/2019   Encounter for orthopedic follow-up care 08/16/2019   Carpal tunnel syndrome of right wrist 05/11/2019   Pain in right hand 05/10/2019   Primary osteoarthritis of right knee 02/26/2017   Right knee DJD 02/26/2017   Pre-operative clearance 01/09/2017   Bronchitis 11/11/2016   Obesity (BMI 30-39.9) 12/23/2013   Sinusitis 09/05/2013   Lower urinary tract infectious disease 03/05/2012   ANXIETY 09/26/2009   Acute sinusitis 09/26/2009   OTHER ACUTE REACTIONS TO STRESS 09/03/2009   Depression with anxiety 06/18/2009   Hyperlipidemia LDL goal <70 04/16/2009   DM (diabetes mellitus) type II uncontrolled, periph vascular disorder (HCC) 01/25/2009   MYALGIA 01/25/2009   HYPERGLYCEMIA, FASTING 05/08/2008   Essential hypertension, benign 01/17/2008   CHEST PAIN 01/17/2008   INSOMNIA 12/31/2007   MURMUR 12/31/2007   Past Medical History:  Diagnosis Date   Diabetes mellitus    Hyperlipidemia    Hypertension    Murmur    Past Surgical History:  Procedure Laterality Date   ABDOMINAL HYSTERECTOMY     BACK SURGERY     CHOLECYSTECTOMY     TONSILLECTOMY     TOTAL KNEE ARTHROPLASTY     TOTAL KNEE ARTHROPLASTY Right 02/26/2017   Procedure: RIGHT TOTAL KNEE ARTHROPLASTY;  Surgeon: Jene Every, MD;  Location: [facility];  Service: Orthopedics;  Laterality: Right;   Social History   Tobacco Use   Smoking status: Never   Smokeless tobacco: Never  Substance Use Topics   Alcohol use: Yes    Comment: rare   Drug  use: No   Social History   Socioeconomic History   Marital status: Widowed    Spouse name: Not on file   Number of children: Not on file   Years of education: Not on file   Highest education level: Associate degree: occupational, Scientist, product/process development, or vocational program  Occupational History   Not on file  Tobacco Use   Smoking status: Never   Smokeless tobacco: Never  Substance and Sexual Activity   Alcohol use: Yes    Comment:  rare   Drug use: No   Sexual activity: Not Currently    Partners: Male  Other Topics Concern   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   Caffeine use: coffee in the am   Social Determinants of Health   Financial Resource Strain: Medium Risk (05/13/2023)   Overall Financial Resource Strain (CARDIA)    Difficulty of Paying Living Expenses: Somewhat hard  Food Insecurity: No Food Insecurity (05/13/2023)   Hunger Vital Sign    Worried About Running Out of Food in the Last Year: Never true    Ran Out of Food in the Last Year: Never true  Transportation Needs: No Transportation Needs (05/13/2023)   PRAPARE - Administrator, Civil Service (Medical): No    Lack of Transportation (Non-Medical): No  Physical Activity: Insufficiently Active (05/13/2023)   Exercise Vital Sign    Days of Exercise per Week: 3 days    Minutes of Exercise per Session: 20 min  Stress: No Stress Concern Present (05/13/2023)   [employer] of Occupational Health - Occupational Stress Questionnaire    Feeling of Stress : Only a little  Social Connections: Moderately Integrated (05/13/2023)   Social Connection and Isolation Panel [NHANES]    Frequency of Communication with Friends and Family: More than three times a week    Frequency of Social Gatherings with Friends and Family: More than three times a week    Attends Religious Services: More than 4 times per year    Active Member of [employer] or Organizations: Yes    Attends Banker Meetings: More than 4 times per year    Marital Status: Widowed  Intimate Partner Violence: Not At Risk (01/13/2023)   Received from [facility]   HITS    Over the last 12 months how often did your partner physically hurt you?: 1    Over the last 12 months how often did your partner insult you or talk down to you?: 1    Over the last 12 months how often did your partner threaten you with physical  harm?: 1    Over the last 12 months how often did your partner scream or curse at you?: 1   Family Status  Relation Name Status   Brother  Alive   Father  Deceased   Brother  Deceased at age 79   Mother  Deceased       unknown cause --pt 5yo   Sister  Armed forces training and education officer   Sister  Alive   Brother  Alive   Brother  Alive   Brother  Alive   Brother  Alive   Brother  Alive   Brother  Alive   Other  (Not Specified)   Other  (Not Specified)  No partnership data on file   Family History  Problem Relation Age of Onset   Throat cancer Brother    Heart disease Brother  MI   Cancer Brother 80       throat,    Hyperlipidemia Brother    Cirrhosis Father    Alcohol abuse Father    Heart disease Brother    Cancer Brother    Arthritis Mother    Hypertension Sister    Arthritis Sister    Alzheimer's disease Sister    Cancer Brother    Cancer Brother 50       melanoma   Hypertension Brother    Hyperlipidemia Brother    Melanoma Other    Arthritis Other    Allergies  Allergen Reactions   Ozempic (0.25 Or 0.5 Mg-Dose) [Semaglutide(0.25 Or 0.5mg -Dos)] Diarrhea and Nausea And Vomiting    Pt stated, "I got dehydrated from this as well"      Review of Systems  Constitutional:  Negative for chills, fever and malaise/fatigue.  HENT:  Positive for ear pain. Negative for congestion and hearing loss.   Eyes:  Negative for blurred vision and discharge.  Respiratory:  Negative for cough, sputum production and shortness of breath.   Cardiovascular:  Negative for chest pain, palpitations and leg swelling.  Gastrointestinal:  Negative for abdominal pain, blood in stool, constipation, diarrhea, heartburn, nausea and vomiting.  Genitourinary:  Negative for dysuria, frequency, hematuria and urgency.  Musculoskeletal:  Negative for back pain, falls and myalgias.  Skin:  Negative for rash.  Neurological:  Negative for dizziness, sensory change, loss of consciousness, weakness and headaches.   Endo/Heme/Allergies:  Negative for environmental allergies. Does not bruise/bleed easily.  Psychiatric/Behavioral:  Negative for depression and suicidal ideas. The patient is not nervous/anxious and does not have insomnia.       Objective:     BP 122/68 (BP Location: Left Arm, Patient Position: Sitting, Cuff Size: Large)   Pulse 78   Temp 97.8 F (36.6 C) (Oral)   Resp 18   Ht 5\' 2"  (1.575 m)   Wt 192 lb (87.1 kg)   SpO2 96%   BMI 35.12 kg/m  BP Readings from Last 3 Encounters:  05/14/23 122/68  03/10/23 138/70  11/18/22 120/60   Wt Readings from Last 3 Encounters:  05/14/23 192 lb (87.1 kg)  03/10/23 196 lb (88.9 kg)  11/18/22 195 lb 6.4 oz (88.6 kg)   SpO2 Readings from Last 3 Encounters:  05/14/23 96%  03/10/23 93%  11/18/22 94%      Physical Exam Vitals and nursing note reviewed.  Constitutional:      General: She is not in acute distress.    Appearance: Normal appearance. She is well-developed.  HENT:     Head: Normocephalic and atraumatic.     Right Ear: Tympanic membrane, ear canal and external ear normal. There is no impacted cerumen.     Left Ear: Tympanic membrane, ear canal and external ear normal. There is no impacted cerumen.     Nose: Nose normal.     Mouth/Throat:     Mouth: Mucous membranes are moist.     Pharynx: Oropharynx is clear. No oropharyngeal exudate or posterior oropharyngeal erythema.  Eyes:     General: No scleral icterus.       Right eye: No discharge.        Left eye: No discharge.     Conjunctiva/sclera: Conjunctivae normal.     Pupils: Pupils are equal, round, and reactive to light.  Neck:     Thyroid: No thyromegaly or thyroid tenderness.     Vascular: No JVD.  Cardiovascular:  Rate and Rhythm: Normal rate and regular rhythm.     Heart sounds: Normal heart sounds. No murmur heard. Pulmonary:     Effort: Pulmonary effort is normal. No respiratory distress.     Breath sounds: Normal breath sounds.  Abdominal:      General: Bowel sounds are normal. There is no distension.     Palpations: Abdomen is soft. There is no mass.     Tenderness: There is no abdominal tenderness. There is no guarding or rebound.  Genitourinary:    Vagina: Normal.  Musculoskeletal:        General: Normal range of motion.     Cervical back: Normal range of motion and neck supple.     Right lower leg: No edema.     Left lower leg: No edema.  Lymphadenopathy:     Cervical: No cervical adenopathy.  Skin:    General: Skin is warm and dry.     Findings: No erythema or rash.  Neurological:     Mental Status: She is alert and oriented to person, place, and time.     Cranial Nerves: No cranial nerve deficit.     Deep Tendon Reflexes: Reflexes are normal and symmetric.  Psychiatric:        Mood and Affect: Mood normal.        Behavior: Behavior normal.        Thought Content: Thought content normal.        Judgment: Judgment normal.      No results found for any visits on 05/14/23.  Last CBC Lab Results  Component Value Date   WBC 5.9 11/18/2022   HGB 12.3 11/18/2022   HCT 37.3 11/18/2022   MCV 89.2 11/18/2022   MCH 29.4 03/08/2021   RDW 14.2 11/18/2022   PLT 237.0 11/18/2022   Last metabolic panel Lab Results  Component Value Date   GLUCOSE 188 (H) 11/18/2022   NA 139 11/18/2022   K 4.3 11/18/2022   CL 104 11/18/2022   CO2 28 11/18/2022   BUN 19 11/18/2022   CREATININE 1.19 11/18/2022   GFR 44.81 (L) 11/18/2022   CALCIUM 8.8 11/18/2022   PROT 6.4 11/18/2022   ALBUMIN 3.8 11/18/2022   BILITOT 0.5 11/18/2022   ALKPHOS 100 11/18/2022   AST 15 11/18/2022   ALT 16 11/18/2022   ANIONGAP 13 11/25/2018   Last lipids Lab Results  Component Value Date   CHOL 146 11/18/2022   HDL 40.60 11/18/2022   LDLCALC 78 11/18/2022   LDLDIRECT 149.6 01/26/2013   TRIG 134.0 11/18/2022   CHOLHDL 4 11/18/2022   Last hemoglobin A1c Lab Results  Component Value Date   HGBA1C 8.8 (A) 03/10/2023   Last thyroid  functions Lab Results  Component Value Date   TSH 3.01 11/14/2021   Last vitamin D Lab Results  Component Value Date   VD25OH 45.42 05/20/2022   Last vitamin B12 and Folate Lab Results  Component Value Date   VITAMINB12 632 05/20/2022      The 10-year ASCVD risk score (Arnett DK, et al., 2019) is: 33%    Assessment & Plan:   Problem List Items Addressed This Visit       Unprioritized   Type 2 diabetes mellitus with stage 3b chronic kidney disease, without long-term current use of insulin (HCC)   Relevant Orders   Lipid panel   CBC with Differential/Platelet   Comprehensive metabolic panel   Hemoglobin A1c   Microalbumin / creatinine urine ratio  POCT Urinalysis Dipstick (Automated)   Aortic atherosclerosis (HCC)   Type 2 diabetes mellitus with diabetic polyneuropathy, without long-term current use of insulin (HCC)    hgba1c to be checked , minimize simple carbs. Increase exercise as tolerated. Continue current meds       Hyperlipidemia LDL goal <70    Tolerating statin, encouraged heart healthy diet, avoid trans fats, minimize simple carbs and saturated fats. Increase exercise as tolerated       Essential hypertension, benign    Well controlled, no changes to meds. Encouraged heart healthy diet such as the DASH diet and exercise as tolerated.        Other Visit Diagnoses     Primary hypertension    -  Primary   Relevant Orders   CBC with Differential/Platelet   Comprehensive metabolic panel   Hyperlipidemia associated with type 2 diabetes mellitus (HCC)       Relevant Orders   Lipid panel   Comprehensive metabolic panel   Hemoglobin A1c   B12 deficiency       Relevant Orders   Vitamin B12   Vitamin D deficiency       Relevant Orders   VITAMIN D 25 Hydroxy (Vit-D Deficiency, Fractures)     Assessment and Plan    Diabetes Mellitus Poorly controlled with recent A1c of 8.2, up from 6.2. Patient reports high stress and inconsistent blood glucose  levels despite being on Trulicity 3mg  for 4-5 weeks. Patient also reports feeling increasingly fatigued. -Order comprehensive blood work to assess overall health and share results with patient's cardiologist, nephrologist, and endocrinologist. -Discuss current diabetes management strategy with endocrinologist at upcoming appointment. -Encourage increased water intake.  Toe Injury Patient dropped a glass candle on toe, causing bruising but no open sores. Patient reports pain and throbbing initially, but has been able to continue daily activities. -No intervention needed at this time. Continue to monitor for any changes or signs of infection.  Ear Discomfort Patient reports intermittent ear discomfort and feeling of fullness, likened to water in the ear. No current pain or discomfort. -Recommend over-the-counter antihistamines such as Claritin or Zyrtec during episodes of discomfort. -Consider use of prescription nasal sprays such as Astepro if symptoms persist.  General Health Maintenance -Consider administering flu shot if patient does not have a fever. Discuss potential side effects such as fatigue.        Return in about 6 months (around 11/11/2023) for annual exam, fasting.    Donato Schultz, DO

## 2023-05-14 NOTE — Assessment & Plan Note (Signed)
Well controlled, no changes to meds. Encouraged heart healthy diet such as the DASH diet and exercise as tolerated.  °

## 2023-05-14 NOTE — Patient Instructions (Signed)

## 2023-05-14 NOTE — Assessment & Plan Note (Signed)
Tolerating statin, encouraged heart healthy diet, avoid trans fats, minimize simple carbs and saturated fats. Increase exercise as tolerated 

## 2023-05-20 NOTE — Progress Notes (Unsigned)
Cardiology Office Note:    Date:  05/21/2023   ID:  Bethany Miller, DOB 1946/03/14, MRN [id number]  PCP:  Zola Button, Grayling Congress, DO   [facility] Providers Cardiologist:  Christell Constant, MD     Referring MD: Zola Button, Grayling Congress, *   CC: CAC f/u  History of Present Illness:    Bethany Miller is a 77 y.o. female with a hx of HTN, HLD with DM, and family history of heart disease.  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  She would like to get checked out (see initially in 2023) 2023: Walked 1.5 miles for a Christmas Parade.  Former Dr. Riley Kill patient. 2024: significant CAC but negative stress test  Discussed the use of AI scribe software for clinical note transcription with the patient, who gave verbal consent to proceed.  Ms.Mahn a 77 year old female with a history of hypertension, hyperlipidemia, and diabetes, was initially seen in 2023. At that time, she was able to walk 1.5 miles without any symptoms.A PET myocardial perfusion imaging test was performed due to some dyspnea on exertion and revealed three-vessel coronary artery calcifications, but normal function and perfusion. An echocardiogram showed mild aortic sclerosis.  Recently, Bethany Miller has been feeling unwell, with elevated blood sugar levels that she has been struggling to control. She also reports a new onset of fatigue, needing to rest after exertion before feeling able to continue activities. Despite these symptoms, she denies any chest pain. Since the dose increase, she reports feeling worse and her blood sugar levels have increased. She also reports experiencing reflux-like symptoms and has returned to taking omperazole to manage this.  Bethany Miller has lost weight since starting Trulicity and reports a decrease in appetite, often only eating one meal a day. She also reports a decrease in muscle mass and generalized weakness. She has a family history of coronary artery disease,  with a twin brother who recently had a triple bypass. Despite this, her stress test from February was normal and she has been reassured that her coronary artery calcifications are not causing any obstruction to blood flow.    Past Medical History:  Diagnosis Date   Diabetes mellitus    Hyperlipidemia    Hypertension    Murmur     Past Surgical History:  Procedure Laterality Date   ABDOMINAL HYSTERECTOMY     Miller SURGERY     CHOLECYSTECTOMY     TONSILLECTOMY     TOTAL KNEE ARTHROPLASTY     TOTAL KNEE ARTHROPLASTY Right 02/26/2017   Procedure: RIGHT TOTAL KNEE ARTHROPLASTY;  Surgeon: Jene Every, MD;  Location: [facility];  Service: Orthopedics;  Laterality: Right;    Current Medications: Current Meds  Medication Sig   acetaminophen (TYLENOL) 650 MG CR tablet Take 650 mg by mouth every 8 (eight) hours as needed for pain.   alendronate (FOSAMAX) 70 MG tablet Take 1 tablet (70 mg total) by mouth every 7 (seven) days. Take with a full glass of water on an empty stomach.   Ascorbic Acid (VITAMIN C) 100 MG tablet Take 100 mg by mouth daily.   aspirin EC 81 MG tablet Take 81 mg by mouth daily.   atorvastatin (LIPITOR) 80 MG tablet Take 1 tablet (80 mg total) by mouth daily.   cholecalciferol (VITAMIN D3) 25 MCG (1000 UNIT) tablet Take 2,000 Units by mouth daily.   citalopram (CELEXA) 40 MG tablet TAKE 1 TABLET EVERY DAY   Continuous  Glucose Sensor (FREESTYLE LIBRE 3 SENSOR) MISC 1 each by Does not apply route every 14 (fourteen) days.   docusate sodium (COLACE) 100 MG capsule Take 1 capsule (100 mg total) by mouth 2 (two) times daily as needed for mild constipation.   Dulaglutide (TRULICITY) 0.75 MG/0.5ML SOPN Inject 0.75 mg into the skin once a week.   Dulaglutide (TRULICITY) 1.5 MG/0.5ML SOPN Inject 1.5 mg into the skin once a week.   Dulaglutide (TRULICITY) 3 MG/0.5ML SOPN Inject 3 mg as directed once a week.   fluticasone (FLONASE) 50 MCG/ACT nasal spray Place 2 sprays into both  nostrils daily.   glucose blood (ACCU-CHEK GUIDE) test strip USE 1 STRIP TO CHECK GLUCOSE ONCE DAILY   Insulin Admin Supplies MISC Use to inject insulin 2 times a day.   insulin glargine (LANTUS SOLOSTAR) 100 UNIT/ML Solostar Pen Inject 10-15 Units into the skin daily.   Insulin Pen Needle 32G X 4 MM MISC Use 1x a day   Lancets (ACCU-CHEK MULTICLIX) lancets Use as instructed to test once a day DX E11.51   lisinopril-hydrochlorothiazide (ZESTORETIC) 10-12.5 MG tablet Take 1 tablet by mouth daily.   Loratadine-Pseudoephedrine (CLARITIN-D 12 HOUR PO) Take 1 tablet by mouth daily as needed (sinus).    meloxicam (MOBIC) 7.5 MG tablet Take 1 tablet (7.5 mg total) by mouth daily.   metFORMIN (GLUCOPHAGE-XR) 500 MG 24 hr tablet Take 1 tablet (500 mg total) by mouth 2 (two) times daily.   Multiple Vitamins-Minerals (MULTIVITAMIN ADULT PO) Take 1 tablet by mouth daily.    omeprazole (PRILOSEC) 20 MG capsule TAKE 1 CAPSULE BY MOUTH DAILY   traZODone (DESYREL) 50 MG tablet Take 0.5-1 tablets (25-50 mg total) by mouth at bedtime as needed for sleep.   vitamin B-12 (CYANOCOBALAMIN) 100 MCG tablet Take 100 mcg by mouth daily.   zinc gluconate 50 MG tablet Take 50 mg by mouth daily.     Allergies:   Ozempic (0.25 or 0.5 mg-dose) [semaglutide(0.25 or 0.5mg -dos)]   Social History   Socioeconomic History   Marital status: Widowed    Spouse name: Not on file   Number of children: Not on file   Years of education: Not on file   Highest education level: Associate degree: occupational, Scientist, product/process development, or vocational program  Occupational History   Not on file  Tobacco Use   Smoking status: Never   Smokeless tobacco: Never  Vaping Use   Vaping status: Never Used  Substance and Sexual Activity   Alcohol use: Yes    Comment: rare   Drug use: No   Sexual activity: Not Currently    Partners: Male  Other Topics Concern   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    Caffeine use: coffee in the am   Social Determinants of Health   Financial Resource Strain: Medium Risk (05/13/2023)   Overall Financial Resource Strain (CARDIA)    Difficulty of Paying Living Expenses: Somewhat hard  Food Insecurity: No Food Insecurity (05/13/2023)   Hunger Vital Sign    Worried About Running Out of Food in the Last Year: Never true    Ran Out of Food in the Last Year: Never true  Transportation Needs: No Transportation Needs (05/13/2023)   PRAPARE - Administrator, Civil Service (Medical): No    Lack of Transportation (Non-Medical): No  Physical Activity: Insufficiently Active (05/13/2023)   Exercise Vital Sign    Days of Exercise per Week: 3  days    Minutes of Exercise per Session: 20 min  Stress: No Stress Concern Present (05/13/2023)   [employer] of Occupational Health - Occupational Stress Questionnaire    Feeling of Stress : Only a little  Social Connections: Moderately Integrated (05/13/2023)   Social Connection and Isolation Panel [NHANES]    Frequency of Communication with Friends and Family: More than three times a week    Frequency of Social Gatherings with Friends and Family: More than three times a week    Attends Religious Services: More than 4 times per year    Active Member of [employer] or Organizations: Yes    Attends Banker Meetings: More than 4 times per year    Marital Status: Widowed    Social: used to see Dr. Riley Kill and loved seeing him  Family History: The patient's family history includes Alcohol abuse in her father; Alzheimer's disease in her sister; Arthritis in her mother, sister, and another family member; Cancer in her brother and brother; Cancer (age of onset: 83) in her brother; Cancer (age of onset: 66) in her brother; Cirrhosis in her father; Diabetes in her brother, brother, and brother; Heart disease in her brother, brother, and brother; Hyperlipidemia in her brother and brother; Hypertension in her  brother and sister; Melanoma in an other family member; Throat cancer in her brother.  ROS:   Please see the history of present illness.     EKGs/Labs/Other Studies Reviewed:    The following studies were reviewed today:  06/26/22: Sinus bradycardia rate 52  Cardiac Studies & Procedures     STRESS TESTS  NM PET CT CARDIAC PERFUSION MULTI W/ABSOLUTE BLOODFLOW 09/10/2022  Narrative   The study is normal. The study is low risk.   LV perfusion is normal. There is no evidence of ischemia. There is no evidence of infarction.   Rest left ventricular function is normal. Rest EF: 74 %. Stress left ventricular function is normal. Stress EF: 80 %. End diastolic cavity size is normal. End systolic cavity size is normal. No evidence of transient ischemic dilation (TID) noted.   Myocardial blood flow was computed to be 0.57ml/g/min at rest and 1.24ml/g/min at stress. Global myocardial blood flow reserve was 2.58 and was normal.   Coronary calcium was present on the attenuation correction CT images. Severe coronary calcifications were present. Coronary calcifications were present in the left anterior descending artery, left circumflex artery and right coronary artery distribution(s).   Electronically signed by: Parke Poisson, MD  CLINICAL DATA:  This over-read does not include interpretation of cardiac or coronary anatomy or pathology. The Cardiac PET CT interpretation by the cardiologist is attached.  COMPARISON:  None Available.  FINDINGS: No pleural fluid.  Clear imaged lungs.  Aortic atherosclerosis.  No imaged thoracic adenopathy.  Cholecystectomy. Normal imaged portions of the liver, spleen, stomach, pancreas, adrenal glands, kidneys.  Thoracic spondylosis.  IMPRESSION: No acute findings in the imaged extracardiac chest.  Aortic Atherosclerosis (ICD10-I70.0).   Electronically Signed By: Jeronimo Greaves M.D. On: 09/10/2022 11:26   ECHOCARDIOGRAM  ECHOCARDIOGRAM COMPLETE  05/15/2017  Narrative *[facility]* [address] [place], [place] [address] [phone]  ------------------------------------------------------------------- Transthoracic Echocardiography  Patient:    Cerissa, Zeiger MR #:       [id number] Study Date: 05/15/2017 Gender:     F Age:        54 Height:     157.5 cm Weight:     90.3 kg BSA:  2.03 m^2 Pt. Status: Room:  PERFORMING   [facility] [place] ATTENDING    [place], [place] R ORDERING     [place], [place] R REFERRING    [place], Myrene Buddy R SONOGRAPHER  Sinda Du, RDCS  cc:  ------------------------------------------------------------------- LV EF: 60% -   65%  ------------------------------------------------------------------- History:   PMH:  Shortness of breath  Murmur.  Risk factors:  Hypertension. Diabetes mellitus. Dyslipidemia.  ------------------------------------------------------------------- Study Conclusions  - Left ventricle: The cavity size was normal. Systolic function was normal. The estimated ejection fraction was in the range of 60% to 65%. Wall motion was normal; there were no regional wall motion abnormalities.  Impressions:  - Normal systolic function, Impaired relaxation. Unremarkable study.  ------------------------------------------------------------------- Study data:  No prior study was available for comparison.  Study status:  Routine.  Procedure:  Transthoracic echocardiography. Image quality was adequate.  Study completion:  There were no complications.          Transthoracic echocardiography.  M-mode, complete 2D, spectral Doppler, and color Doppler.  Birthdate: Patient birthdate: 12-May-1946.  Age:  Patient is 77 yr old.  Sex: Gender: female.    BMI: 36.4 kg/m^2.  Blood pressure:     113/69 Patient status:  Outpatient.  Study date:  Study date: 05/15/2017. Study time: 01:24 PM.  Location:   Bedside.  -------------------------------------------------------------------  ------------------------------------------------------------------- Left ventricle:  Impaired left ventricular relaxation The cavity size was normal. Systolic function was normal. The estimated ejection fraction was in the range of 60% to 65%. Wall motion was normal; there were no regional wall motion abnormalities.  ------------------------------------------------------------------- Aortic valve:   Trileaflet; normal thickness leaflets. Mobility was not restricted.  Doppler:  Transvalvular velocity was within the normal range. There was no stenosis. There was no regurgitation.  ------------------------------------------------------------------- Aorta:  Aortic root: The aortic root was normal in size.  ------------------------------------------------------------------- Mitral valve:   Structurally normal valve.   Mobility was not restricted.  Doppler:  Transvalvular velocity was within the normal range. There was no evidence for stenosis. There was no regurgitation.    Peak gradient (D): 2 mm Hg.  ------------------------------------------------------------------- Left atrium:  The atrium was normal in size.  ------------------------------------------------------------------- Right ventricle:  The cavity size was normal. Wall thickness was normal. Systolic function was normal.  ------------------------------------------------------------------- Pulmonic valve:    No significant valve disease.    Doppler: Transvalvular velocity was within the normal range. There was no evidence for stenosis.  ------------------------------------------------------------------- Tricuspid valve:   Structurally normal valve.    Doppler: Transvalvular velocity was within the normal range. There was no regurgitation.  ------------------------------------------------------------------- Pulmonary artery:   The main  pulmonary artery was normal-sized. Systolic pressure was within the normal range.  ------------------------------------------------------------------- Right atrium:  The atrium was normal in size.  ------------------------------------------------------------------- Pericardium:  There was no pericardial effusion.  ------------------------------------------------------------------- Systemic veins: Inferior vena cava: The vessel was normal in size.  ------------------------------------------------------------------- Measurements  Left ventricle                          Value        Reference LV ID, ED, PLAX chordal         (L)     40.9  mm     43 - 52 LV ID, ES, PLAX chordal         (L)     21.5  mm     23 - 38 LV fx shortening, PLAX chordal  47    %      >=29 LV PW thickness, ED                     11.8  mm     ---------- IVS/LV PW ratio, ED                     0.71         <=1.3 Stroke volume, 2D                       69    ml     ---------- Stroke volume/bsa, 2D                   34    ml/m^2 ---------- LV e&', lateral                          10.7  cm/s   ---------- LV E/e&', lateral                        7.38         ---------- LV e&', medial                           6.64  cm/s   ---------- LV E/e&', medial                         11.9         ---------- LV e&', average                          8.67  cm/s   ---------- LV E/e&', average                        9.11         ----------  Ventricular septum                      Value        Reference IVS thickness, ED                       8.41  mm     ----------  LVOT                                    Value        Reference LVOT ID, S                              17    mm     ---------- LVOT area                               2.27  cm^2   ---------- LVOT peak velocity, S                   139   cm/s   ---------- LVOT mean velocity, S                   97.7  cm/s   ----------  LVOT VTI, S                             30.3   cm     ---------- LVOT peak gradient, S                   8     mm Hg  ----------  Aorta                                   Value        Reference Aortic root ID, ED                      21    mm     ----------  Left atrium                             Value        Reference LA ID, A-P, ES                          36    mm     ---------- LA ID/bsa, A-P                          1.77  cm/m^2 <=2.2 LA volume, S                            35.2  ml     ---------- LA volume/bsa, S                        17.3  ml/m^2 ---------- LA volume, ES, 1-p A4C                  31.3  ml     ---------- LA volume/bsa, ES, 1-p A4C              15.4  ml/m^2 ---------- LA volume, ES, 1-p A2C                  38.3  ml     ---------- LA volume/bsa, ES, 1-p A2C              18.9  ml/m^2 ----------  Mitral valve                            Value        Reference Mitral E-wave peak velocity             79    cm/s   ---------- Mitral A-wave peak velocity             98.2  cm/s   ---------- Mitral deceleration time        (H)     285   ms     150 - 230 Mitral peak gradient, D                 2     mm Hg  ---------- Mitral E/A ratio, peak                  0.8          ----------  Right atrium                            Value        Reference RA ID, S-I, ES, A4C                     46.2  mm     34 - 49 RA area, ES, A4C                        12    cm^2   8.3 - 19.5 RA volume, ES, A/L                      26.4  ml     ---------- RA volume/bsa, ES, A/L                  13    ml/m^2 ----------  Systemic veins                          Value        Reference Estimated CVP                           3     mm Hg  ----------  Right ventricle                         Value        Reference RV ID, minor axis, ED, A4C base         32.6  mm     ---------- TAPSE                                   19.5  mm     ---------- RV s&', lateral, S                       16.7  cm/s   ----------  Legend: (L)  and  (H)  mark values outside  specified reference range.  ------------------------------------------------------------------- Prepared and Electronically Authenticated by  Belva Crome, MD 2018-11-02T15:42:25              Recent Labs: 05/14/2023: ALT 15; BUN 23; Creatinine, Ser 1.32; Hemoglobin 12.2; Platelets 225.0; Potassium 4.5; Sodium 138  Recent Lipid Panel    Component Value Date/Time   CHOL 109 05/14/2023 1016   TRIG 126.0 05/14/2023 1016   TRIG 172 (H) 06/23/2006 0942   HDL 34.60 (L) 05/14/2023 1016   CHOLHDL 3 05/14/2023 1016   VLDL 25.2 05/14/2023 1016   LDLCALC 49 05/14/2023 1016   LDLCALC 87 03/08/2021 1549   LDLDIRECT 149.6 01/26/2013 1420    Physical Exam:    VS:  BP 124/66 (BP Location: Right Arm, Patient Position: Sitting, Cuff Size: Normal)   Pulse 71   Resp 16   Ht 5\' 2"  (1.575 m)   Wt 190 lb 9.6 oz (86.5 kg)   SpO2 93%   BMI 34.86 kg/m     Wt Readings from Last 3 Encounters:  05/21/23 190 lb 9.6 oz (86.5 kg)  05/14/23 192 lb (87.1 kg)  03/10/23 196 lb (88.9 kg)    GEN:   no acute distress HEENT: Normal NECK: No  JVD CARDIAC: regular rhythm with systolic murmur,  no rubs, gallops RESPIRATORY:  Clear to auscultation without rales, wheezing or rhonchi  ABDOMEN: Soft, non-tender, non-distended MUSCULOSKELETAL:  no edema; No deformity  SKIN: Warm and dry NEUROLOGIC:  Alert and oriented x 3 PSYCHIATRIC:  Normal affect   ASSESSMENT:    1. Coronary artery calcification   2. Mixed hyperlipidemia   3. Aortic atherosclerosis (HCC)     PLAN:     Coronary Artery Calcifications HLD with DM Obesity Family history of CAD - Asymptomatic with coronary artery calcifications noted on PET myocardial perfusion imaging. Negative stress test. Family history of coronary artery disease. -Continue current management and monitoring (ASA, statin) - Well-managed with LDL under 55; continue current lipid-lowering therapy with Atorvastatin. -Discussed lifestyle modifications including  smaller, more frequent meals, increased protein and fiber intake, hydration, and strength training to manage side effects of medication and improve overall health. - we discussed Silver Sneakers for this support  Aortic Sclerosis - no evidence of AS -No need for serial monitoring unless clinical condition changes.  Hypertension - well controlled     One year follow up   Medication Adjustments/Labs and Tests Ordered: Current medicines are reviewed at length with the patient today.  Concerns regarding medicines are outlined above.  No orders of the defined types were placed in this encounter.  No orders of the defined types were placed in this encounter.   Patient Instructions  Medication Instructions:  Your physician recommends that you continue on your current medications as directed. Please refer to the Current Medication list given to you today.  *If you need a refill on your cardiac medications before your next appointment, please call your pharmacy*   Lab Work: NONE If you have labs (blood work) drawn today and your tests are completely normal, you will receive your results only by: MyChart Message (if you have MyChart) OR A paper copy in the mail If you have any lab test that is abnormal or we need to change your treatment, we will call you to review the results.   Testing/Procedures: NONE   Follow-Up: At [facility], you and your health needs are our priority.  As part of our continuing mission to provide you with exceptional heart care, we have created designated Provider Care Teams.  These Care Teams include your primary Cardiologist (physician) and Advanced Practice Providers (APPs -  Physician Assistants and Nurse Practitioners) who all work together to provide you with the care you need, when you need it.    Your next appointment:   1 year(s)  Provider:   Riley Lam, MD       Signed, Christell Constant, MD  05/21/2023 9:15 AM     [facility]

## 2023-05-21 ENCOUNTER — Encounter: Payer: Self-pay | Admitting: Internal Medicine

## 2023-05-21 ENCOUNTER — Ambulatory Visit: Payer: Medicare Other | Attending: Internal Medicine | Admitting: Internal Medicine

## 2023-05-21 VITALS — BP 124/66 | HR 71 | Resp 16 | Ht 62.0 in | Wt 190.6 lb

## 2023-05-21 DIAGNOSIS — I700 Atherosclerosis of aorta: Secondary | ICD-10-CM

## 2023-05-21 DIAGNOSIS — I2510 Atherosclerotic heart disease of native coronary artery without angina pectoris: Secondary | ICD-10-CM

## 2023-05-21 DIAGNOSIS — E782 Mixed hyperlipidemia: Secondary | ICD-10-CM | POA: Diagnosis not present

## 2023-05-21 NOTE — Patient Instructions (Signed)

## 2023-06-10 ENCOUNTER — Encounter: Payer: Self-pay | Admitting: Internal Medicine

## 2023-06-10 ENCOUNTER — Ambulatory Visit: Payer: Medicare Other | Admitting: Internal Medicine

## 2023-06-10 VITALS — BP 130/76 | HR 86 | Ht 62.0 in | Wt 191.4 lb

## 2023-06-10 DIAGNOSIS — E1122 Type 2 diabetes mellitus with diabetic chronic kidney disease: Secondary | ICD-10-CM | POA: Diagnosis not present

## 2023-06-10 DIAGNOSIS — Z23 Encounter for immunization: Secondary | ICD-10-CM | POA: Diagnosis not present

## 2023-06-10 DIAGNOSIS — N1832 Chronic kidney disease, stage 3b: Secondary | ICD-10-CM

## 2023-06-10 DIAGNOSIS — Z794 Long term (current) use of insulin: Secondary | ICD-10-CM

## 2023-06-10 LAB — POCT GLYCOSYLATED HEMOGLOBIN (HGB A1C): Hemoglobin A1C: 7.8 % — AB (ref 4.0–5.6)

## 2023-06-10 MED ORDER — INSULIN PEN NEEDLE 32G X 4 MM MISC: Status: AC

## 2023-06-10 MED ORDER — NOVOLOG FLEXPEN 100 UNIT/ML ~~LOC~~ SOPN: 4.0000 [IU] | PEN_INJECTOR | Freq: Two times a day (BID) | SUBCUTANEOUS | 11 refills | Status: DC

## 2023-06-10 NOTE — Progress Notes (Signed)
Patient ID: Bethany Miller, female   DOB: 01-Mar-1946, 77 y.o.   MRN: [id number]  HPI: Bethany Miller is a 77 y.o.-year-old female, presenting for f/u for DM2, dx 2010, insulin-dependent, uncontrolled, with complications (CKD).  Last visit 2 months ago. She has [employer] part D for meds, [employer], and [employer] for the rest.   Interim history: No increased urination, chest pain, nausea. She stopped Trulicity 2 weeks ago due to fatigue.  She is feeling much better.  Sugars actually improved Since last visit, she adjusted her diet and reduced the amount of carbs.  Reviewed HbA1c levels: Lab Results  Component Value Date   HGBA1C 8.6 (H) 05/14/2023   HGBA1C 8.8 (A) 03/10/2023   HGBA1C 6.6 (A) 09/09/2022   HGBA1C 7.0 (A) 05/01/2022   HGBA1C 6.9 (A) 01/21/2022   HGBA1C 6.3 (A) 09/03/2021   HGBA1C 7.3 (A) 04/16/2021   HGBA1C 7.0 (A) 12/07/2020   HGBA1C 8.1 (A) 08/31/2020   HGBA1C 7.4 (A) 02/28/2020   HGBA1C 9.2 (H) 10/06/2019   HGBA1C 8.6 (H) 04/01/2019   HGBA1C 10.4 (H) 11/30/2018   HGBA1C 9.5 (A) 08/26/2018   HGBA1C 9.8 (A) 03/31/2018   HGBA1C 8.4 11/26/2017   HGBA1C 7.0 07/30/2017   HGBA1C 7.6 (H) 04/21/2017   HGBA1C 7.4 (H) 03/31/2017   HGBA1C 8.5 (H) 02/17/2017   Previously on: - Metformin ER 1000 mg 2x a day (2000 mg with dinner did not change the sugars) >> 1000 mg with breakfast - Glipizide 5 >> 10 mg 2x a day before breakfast and dinner - >> stopped 02/2020 by Dr. Lonzo Cloud We tried Ozempic but she developed nausea and vomiting and ended up in the emergency room in 11/2018. We again tried [place]  - added again 04/2018 >> not affordable Stopped  Glipizide 5 mg 2x a day 06/2017 b/c lows, but restarted afterwards. Tradjenta and Alogliptin were not covered She was previously on Invokana, Jardiance 10 mg daily >> too expensive. Tried Cycloset >> too expensive Tried Januvia >>  too expensive.  Tried Metformin 500 mg po >> severe diarrhea. She was on Glimepiride >>  hypoglycemia in the 60's on 2 mg daily. She had rapid acting insulin with steroid shots before.   Now on: - Metformin ER 500 mg 2x a day >> .Marland KitchenMarland Kitchen500 mg in am >> 500 mg 2-4x a day (occas. diarrhea) - Lantus 10-12 units in HS - added Miller 04/2021 >> 12 >> 15 >> 10-12 >> 15 >> 18 >> 10-18 units daily - Trulicity 1.5 mg weekly (PAP) >> 0.75 >> 1.5 >> 3 mg weekly >> stopped 05/25/2023 2/2 fatigue Stopped Lantus ~09/2020 >> restarted. Previously on glipizide 5 mg before a larger dinner. She was previously on Jardiance but this caused increased urination and vulvar irritation so she stopped.  Pt checks her sugars >4x a day - CGM:  Prev.: - am:  88-188 >> 90s-128, 130, 169 >> 153-200s - 2h after b'fast: 97, 164, 173 >> n/c - Lunch:  low 100s >> n/c >> 163 >> n/c - 2h after lunch: 160 >> n/c  - Dinnertime: 122, 129 >> 90-125 >> n/c >> 90s-110 >> n/c - 2h after dinner: 97-130 >> n/c >> 130-140 >> 150-200 - nighttime: 368 >> n/c >> 130 >> n/c Lowest sugar was 56 x1 >> 88 >> 90 >> 91;  she has hypoglycemia awareness in the 90s >> 70s. Highest sugar was 360 >> 188 (raisins in chocolate) >> 169 >> upper 200.  ReliOn meter.  -+ CKD,  last BUN/creatinine:  Lab Results  Component Value Date   BUN 23 05/14/2023   CREATININE 1.32 (H) 05/14/2023   No MAU: Lab Results  Component Value Date   MICRALBCREAT 1.0 05/14/2023   MICRALBCREAT 1.0 11/18/2022   MICRALBCREAT 4 03/28/2016   MICRALBCREAT 0.6 11/16/2015   MICRALBCREAT 0.6 01/26/2013   MICRALBCREAT 0.3 03/24/2012   MICRALBCREAT 0.4 03/18/2011   MICRALBCREAT 11.4 10/29/2009   MICRALBCREAT 3.4 04/09/2009   MICRALBCREAT 17.1 01/25/2009  06/03/2021 (CCK): <4  GFR: Lab Results  Component Value Date   GFRNONAA 29 (L) 11/25/2018   GFRNONAA 45 (L) 04/21/2017   GFRNONAA 54 (L) 02/27/2017   GFRNONAA 35 (L) 02/17/2017   GFRNONAA 52.79 (L) 06/19/2010   GFRNONAA 59.68 10/29/2009   GFRNONAA 53.52 07/03/2009   GFRNONAA 59.78 04/09/2009   GFRNONAA  59.82 01/25/2009   GFRNONAA 78 05/08/2008  On Lisinopril 10.  -+ HL; last set of lipids: Lab Results  Component Value Date   CHOL 109 05/14/2023   HDL 34.60 (L) 05/14/2023   LDLCALC 49 05/14/2023   LDLDIRECT 149.6 01/26/2013   TRIG 126.0 05/14/2023   CHOLHDL 3 05/14/2023  On Lipitor 40 >> 80,  - last eye exam was in 2024: No DR reportedly. She had cataract surgery OS in 01/2023.  - no numbness and tingling in her feet.  She sees podiatry for ingrown toenails.  Last foot exam 03/10/2023.  She also has a history of HTN. 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  ROS: + See HPI  I reviewed pt's medications, allergies, PMH, social hx, family hx, and changes were documented in the history of present illness. Otherwise, unchanged from my initial visit note.  Past Medical History:  Diagnosis Date   Diabetes mellitus    Hyperlipidemia    Hypertension    Murmur    Past Surgical History:  Procedure Laterality Date   ABDOMINAL HYSTERECTOMY     Miller SURGERY     CHOLECYSTECTOMY     TONSILLECTOMY     TOTAL KNEE ARTHROPLASTY     TOTAL KNEE ARTHROPLASTY Right 02/26/2017   Procedure: RIGHT TOTAL KNEE ARTHROPLASTY;  Surgeon: Jene Every, MD;  Location: [facility];  Service: Orthopedics;  Laterality: Right;   Social History   Socioeconomic History   Marital status: Widowed    Spouse name: Not on file   Number of children: Not on file   Years of education: Not on file   Highest education level: Associate degree: occupational, Scientist, product/process development, or vocational program  Occupational History   Not on file  Tobacco Use   Smoking status: Never   Smokeless tobacco: Never  Vaping Use   Vaping status: Never Used  Substance and Sexual Activity   Alcohol use: Yes    Comment: rare   Drug use: No   Sexual activity: Not Currently    Partners: Male  Other Topics Concern   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   Caffeine use: coffee in the am   Social Determinants of  Health   Financial Resource Strain: Medium Risk (05/13/2023)   Overall Financial Resource Strain (CARDIA)    Difficulty of Paying Living Expenses: Somewhat hard  Food Insecurity: No Food Insecurity (05/13/2023)   Hunger Vital Sign    Worried About Running Out of Food in the Last Year: Never true    Ran Out of Food in the Last Year: Never true  Transportation Needs: No Transportation Needs (05/13/2023)   PRAPARE -  Administrator, Civil Service (Medical): No    Lack of Transportation (Non-Medical): No  Physical Activity: Insufficiently Active (05/13/2023)   Exercise Vital Sign    Days of Exercise per Week: 3 days    Minutes of Exercise per Session: 20 min  Stress: No Stress Concern Present (05/13/2023)   [employer] of Occupational Health - Occupational Stress Questionnaire    Feeling of Stress : Only a little  Social Connections: Moderately Integrated (05/13/2023)   Social Connection and Isolation Panel [NHANES]    Frequency of Communication with Friends and Family: More than three times a week    Frequency of Social Gatherings with Friends and Family: More than three times a week    Attends Religious Services: More than 4 times per year    Active Member of [employer] or Organizations: Yes    Attends Banker Meetings: More than 4 times per year    Marital Status: Widowed  Intimate Partner Violence: Not At Risk (01/13/2023)   [facility]   HITS    Over the last 12 months how often did your partner physically hurt you?: Never    Over the last 12 months how often did your partner insult you or talk down to you?: Never    Over the last 12 months how often did your partner threaten you with physical harm?: Never    Over the last 12 months how often did your partner scream or curse at you?: Never   Current Outpatient Medications on File Prior to Visit  Medication Sig Dispense Refill   acetaminophen (TYLENOL) 650 MG CR tablet Take  650 mg by mouth every 8 (eight) hours as needed for pain.     alendronate (FOSAMAX) 70 MG tablet Take 1 tablet (70 mg total) by mouth every 7 (seven) days. Take with a full glass of water on an empty stomach. 12 tablet 3   Ascorbic Acid (VITAMIN C) 100 MG tablet Take 100 mg by mouth daily.     aspirin EC 81 MG tablet Take 81 mg by mouth daily.     atorvastatin (LIPITOR) 80 MG tablet Take 1 tablet (80 mg total) by mouth daily. 90 tablet 3   cholecalciferol (VITAMIN D3) 25 MCG (1000 UNIT) tablet Take 2,000 Units by mouth daily.     citalopram (CELEXA) 40 MG tablet TAKE 1 TABLET EVERY DAY 90 tablet 3   Continuous Glucose Sensor (FREESTYLE LIBRE 3 SENSOR) MISC 1 each by Does not apply route every 14 (fourteen) days. 6 each 3   docusate sodium (COLACE) 100 MG capsule Take 1 capsule (100 mg total) by mouth 2 (two) times daily as needed for mild constipation. 30 capsule 1   Dulaglutide (TRULICITY) 0.75 MG/0.5ML SOPN Inject 0.75 mg into the skin once a week. 6 mL 3   Dulaglutide (TRULICITY) 1.5 MG/0.5ML SOPN Inject 1.5 mg into the skin once a week. 8 mL 2   Dulaglutide (TRULICITY) 3 MG/0.5ML SOPN Inject 3 mg as directed once a week. 6 mL 3   fluticasone (FLONASE) 50 MCG/ACT nasal spray Place 2 sprays into both nostrils daily. 16 g 6   glucose blood (ACCU-CHEK GUIDE) test strip USE 1 STRIP TO CHECK GLUCOSE ONCE DAILY 100 each 4   Insulin Admin Supplies MISC Use to inject insulin 2 times a day. 180 each 0   insulin glargine (LANTUS SOLOSTAR) 100 UNIT/ML Solostar Pen Inject 10-15 Units into the skin daily. 30 mL 3  Insulin Pen Needle 32G X 4 MM MISC Use 1x a day 100 each 3   Lancets (ACCU-CHEK MULTICLIX) lancets Use as instructed to test once a day DX E11.51 100 each 1   lisinopril-hydrochlorothiazide (ZESTORETIC) 10-12.5 MG tablet Take 1 tablet by mouth daily. 90 tablet 3   Loratadine-Pseudoephedrine (CLARITIN-D 12 HOUR PO) Take 1 tablet by mouth daily as needed (sinus).      meloxicam (MOBIC) 7.5 MG  tablet Take 1 tablet (7.5 mg total) by mouth daily. 90 tablet 3   metFORMIN (GLUCOPHAGE-XR) 500 MG 24 hr tablet Take 1 tablet (500 mg total) by mouth 2 (two) times daily. 180 tablet 3   Multiple Vitamins-Minerals (MULTIVITAMIN ADULT PO) Take 1 tablet by mouth daily.      omeprazole (PRILOSEC) 20 MG capsule TAKE 1 CAPSULE BY MOUTH DAILY 90 capsule 1   traZODone (DESYREL) 50 MG tablet Take 0.5-1 tablets (25-50 mg total) by mouth at bedtime as needed for sleep. 90 tablet 1   vitamin B-12 (CYANOCOBALAMIN) 100 MCG tablet Take 100 mcg by mouth daily.     zinc gluconate 50 MG tablet Take 50 mg by mouth daily.     No current facility-administered medications on file prior to visit.   Allergies  Allergen Reactions   Ozempic (0.25 Or 0.5 Mg-Dose) [Semaglutide(0.25 Or 0.5mg -Dos)] Diarrhea and Nausea And Vomiting    Pt stated, "I got dehydrated from this as well"   Family History  Problem Relation Age of Onset   Arthritis Mother    Cirrhosis Father    Alcohol abuse Father    Hypertension Sister    Arthritis Sister    Alzheimer's disease Sister    Throat cancer Brother    Heart disease Brother        MI   Cancer Brother 80       throat,    Hyperlipidemia Brother    Heart disease Brother    Cancer Brother    Cancer Brother    Heart disease Brother    Diabetes Brother    Diabetes Brother    Cancer Brother 50       melanoma   Diabetes Brother    Hypertension Brother    Hyperlipidemia Brother    Melanoma Other    Arthritis Other    PE: BP 130/76   Pulse 86   Ht 5\' 2"  (1.575 m)   Wt 191 lb 6.4 oz (86.8 kg)   SpO2 94%   BMI 35.01 kg/m  Wt Readings from Last 3 Encounters:  06/10/23 191 lb 6.4 oz (86.8 kg)  05/21/23 190 lb 9.6 oz (86.5 kg)  05/14/23 192 lb (87.1 kg)   Constitutional: overweight, in NAD Eyes:EOMI, no exophthalmos ENT: no thyromegaly, no cervical lymphadenopathy Cardiovascular: RRR, No MRG Respiratory: CTA B Musculoskeletal: no deformities Skin: no  rashes Neurological: no tremor with outstretched hands  ASSESSMENT: 1. DM2, insulin-dependent, uncontrolled, with long-term complications: - CKD  2. HL   3. Obesity class II  PLAN:  1. Patient with longstanding, uncontrolled, type 2 diabetes, on oral medication with metformin and also daily long-acting insulin, with worsening control at last visit.  At that time she was also on a GLP1 R agonist, which we continued, however, she stopped this 2 weeks ago due to fatigue and feels that her sugars improved afterwards and she is feeling much better.  - At last visit, HbA1c was 8.8%, higher than before.  She had another HbA1c obtained 4 weeks ago which was slightly lower at  8.6%, but still above target.  At last visit we did increase her Lantus and Trulicity dose and continued metformin.  At that time, sugars were high, up to 200s, due to increased stress with 2 of her siblings passing away in 01/2023.  She also had cataract surgery and was on steroid drops. CGM interpretation: -At today's visit, we reviewed her CGM downloads: It appears that 181 of values are in target range (goal >70%), while 53% are higher than 180 (goal <25%), and 0% are lower than 70 (goal <4%).  The calculated average blood sugar is 181.  The projected HbA1c for the next 3 months (GMI) is 7.6%. -Reviewing the CGM trends, sugars are fluctuating around the upper limit of the target range but with significant hyperglycemic spikes after certain meals, despite reducing carbs and eating better quality starches.  Therefore, at today's visit we discussed about adding a low-dose of NovoLog before larger meals.  She agrees with this.  Upon questioning, she is varying the dose of Lantus between 10 and 18 units and I advised her to try to stay with a single dose.  She does need to take it consistently. -We discussed about diet including the best fruit to eat (she is eating grapes and I advised her to switch to berries), also advised to eat carbs  at the end rather than the beginning of the meal. - I suggested to:  Patient Instructions  Please continue: - Metformin 1000 mg 2x a day - Lantus 18 units daily  Try to start: - NovoLog 4-8 units 15 min before larger meals   Please return in 3 months.  - we checked her HbA1c: 7.8% (already better) - advised to check sugars at different times of the day - 4x a day, rotating check times - advised for yearly eye exams >> she is UTD - return to clinic in 3 months   2. HL -Reviewed latest lipid panel from 04/2023: LDL excellent, drastically improved from baseline value in 2014, HDL slightly low, triglycerides at goal: Lab Results  Component Value Date   CHOL 109 05/14/2023   HDL 34.60 (L) 05/14/2023   LDLCALC 49 05/14/2023   LDLDIRECT 149.6 01/26/2013   TRIG 126.0 05/14/2023   CHOLHDL 3 05/14/2023  -She is on Lipitor increased from 40 mg to 80 mg daily - without side effects.  She was previously on fenofibrate, but now off.  3.  Obesity class II -She not tolerate Ozempic due to nausea and vomiting and since last visit, she also had to stop Trulicity due to fatigue. -She gained 4 pounds before last visit.  That time, we increased the dose of her Trulicity.  As mentioned above, she is now off Trulicity but she is net -5 pounds since last visit.  Will continue without GLP-1 receptor agonists.  Carlus Pavlov, MD PhD [facility]

## 2023-06-10 NOTE — Patient Instructions (Addendum)
Please continue: - Metformin 1000 mg 2x a day - Lantus 18 units daily  Try to start: - NovoLog 4-8 units 15 min before larger meals   Please return in 3 months.

## 2023-06-30 NOTE — Telephone Encounter (Signed)
Results in Care Everywhere and abstracted.   Did not update care team as it was a consult for surgery and not the regular ophthalmologist.

## 2023-08-11 ENCOUNTER — Telehealth: Payer: Self-pay | Admitting: Family Medicine

## 2023-08-11 NOTE — Telephone Encounter (Signed)
Copied from CRM [id number]. Topic: Medicare AWV >> Aug 11, 2023  2:25 PM Payton Doughty wrote: Reason for CRM: Called LVM 08/11/2023 to schedule AWV. Please schedule Virtual or Telehealth visits ONLY.   Verlee Rossetti; Care Guide Ambulatory Clinical Support [facility] Direct Dial: [phone]

## 2023-08-26 ENCOUNTER — Other Ambulatory Visit: Payer: Self-pay | Admitting: Family Medicine

## 2023-08-26 DIAGNOSIS — G4700 Insomnia, unspecified: Secondary | ICD-10-CM

## 2023-09-17 ENCOUNTER — Ambulatory Visit: Payer: Medicare Other | Admitting: Internal Medicine

## 2023-11-04 ENCOUNTER — Ambulatory Visit: Payer: Medicare Other | Admitting: Internal Medicine

## 2023-11-04 ENCOUNTER — Encounter: Payer: Self-pay | Admitting: Internal Medicine

## 2023-11-04 VITALS — BP 122/70 | HR 68 | Ht 62.0 in | Wt 199.6 lb

## 2023-11-04 DIAGNOSIS — Z794 Long term (current) use of insulin: Secondary | ICD-10-CM

## 2023-11-04 DIAGNOSIS — E669 Obesity, unspecified: Secondary | ICD-10-CM

## 2023-11-04 DIAGNOSIS — N1832 Chronic kidney disease, stage 3b: Secondary | ICD-10-CM | POA: Diagnosis not present

## 2023-11-04 DIAGNOSIS — E785 Hyperlipidemia, unspecified: Secondary | ICD-10-CM

## 2023-11-04 DIAGNOSIS — E1122 Type 2 diabetes mellitus with diabetic chronic kidney disease: Secondary | ICD-10-CM

## 2023-11-04 LAB — POCT GLYCOSYLATED HEMOGLOBIN (HGB A1C): Hemoglobin A1C: 8.7 % — AB (ref 4.0–5.6)

## 2023-11-04 MED ORDER — LANTUS SOLOSTAR 100 UNIT/ML ~~LOC~~ SOPN
22.0000 [IU] | PEN_INJECTOR | Freq: Every day | SUBCUTANEOUS | 3 refills | Status: AC
Start: 2023-11-04 — End: ?

## 2023-11-04 MED ORDER — NOVOLOG FLEXPEN 100 UNIT/ML ~~LOC~~ SOPN: 8.0000 [IU] | PEN_INJECTOR | Freq: Three times a day (TID) | SUBCUTANEOUS | 3 refills | Status: DC

## 2023-11-04 NOTE — Patient Instructions (Addendum)
 Please continue: - Metformin  1000 mg in am  Please increase: - Lantus  22 units daily at night - NovoLog  8-14 units 15-30 min before larger meals   Please return in 3-4 months.

## 2023-11-04 NOTE — Progress Notes (Signed)
 Patient ID: Bethany Miller, female   DOB: [birth year], 78 y.o.   MRN: [id number]  HPI: Bethany Miller is a 78 y.o.-year-old female, presenting for f/u for DM2, dx 2010, insulin -dependent, uncontrolled, with complications (CKD).  Last visit 5 months ago. She has [employer] part D for meds, [employer], and [employer] for the rest.   Interim history: No increased urination, chest pain, nausea. She is frustrated about her blood sugars fluctuations.  Reviewed HbA1c levels: Lab Results  Component Value Date   HGBA1C 7.8 (A) 06/10/2023   HGBA1C 8.6 (H) 05/14/2023   HGBA1C 8.8 (A) 03/10/2023   HGBA1C 6.6 (A) 09/09/2022   HGBA1C 7.0 (A) 05/01/2022   HGBA1C 6.9 (A) 01/21/2022   HGBA1C 6.3 (A) 09/03/2021   HGBA1C 7.3 (A) 04/16/2021   HGBA1C 7.0 (A) 12/07/2020   HGBA1C 8.1 (A) 08/31/2020   HGBA1C 7.4 (A) 02/28/2020   HGBA1C 9.2 (H) 10/06/2019   HGBA1C 8.6 (H) 04/01/2019   HGBA1C 10.4 (H) 11/30/2018   HGBA1C 9.5 (A) 08/26/2018   HGBA1C 9.8 (A) 03/31/2018   HGBA1C 8.4 11/26/2017   HGBA1C 7.0 07/30/2017   HGBA1C 7.6 (H) 04/21/2017   HGBA1C 7.4 (H) 03/31/2017   Previously on: - Metformin  ER 1000 mg 2x a day (2000 mg with dinner did not change the sugars) >> 1000 mg with breakfast - Glipizide  5 >> 10 mg 2x a day before breakfast and dinner - >> stopped 02/2020 by Dr. Rosalea Collin We tried Ozempic  but she developed nausea and vomiting and ended up in the emergency room in 11/2018. We again tried Jardiance   - added again 04/2018 >> not affordable Stopped  Glipizide  5 mg 2x a day 06/2017 b/c lows, but restarted afterwards. Tradjenta  and Alogliptin  were not covered She was previously on Invokana , Jardiance  10 mg daily >> too expensive. Tried Cycloset  >> too expensive Tried Januvia  >>  too expensive.  Tried Metformin  500 mg po >> severe diarrhea. She was on Glimepiride  >> hypoglycemia in the 60's on 2 mg daily. She had rapid acting insulin  with steroid shots before.   Now on: - Metformin  ER 500  mg 2x a day >> .Bethany AasAaron Aas500 mg in am >> 500 mg 2-4x a day (occas. diarrhea) >> 1000 mg in am - Lantus  10-12 units in HS - added back 04/2021 >> 12 >> 15 >> 10-12 >> 15 >> 18 >> 10-18 >> 18 units daily - NovoLog  4-8 units 15 min before larger meals >> now before L and D Stopped Lantus  ~09/2020 >> restarted. Previously on glipizide  5 mg before a larger dinner. She was previously on Jardiance  but this caused increased urination and vulvar irritation so she stopped. She stopped Trulicity  in 05/2023 due to fatigue. She could not tolerate Ozempic  due to nausea and vomiting.  Pt checks her sugars >4x a day - CGM:  Previously:  Prev.: - am:  88-188 >> 90s-128, 130, 169 >> 153-200s - 2h after b'fast: 97, 164, 173 >> n/c - Lunch:  low 100s >> n/c >> 163 >> n/c - 2h after lunch: 160 >> n/c  - Dinnertime: 122, 129 >> 90-125 >> n/c >> 90s-110 >> n/c - 2h after dinner: 97-130 >> n/c >> 130-140 >> 150-200 - nighttime: 368 >> n/c >> 130 >> n/c Lowest sugar was 56 x1 >> 88 >> 90 >> 91 >> 60s;  she has hypoglycemia awareness in the 90s >> 70s. Highest sugar was 360 >> 188 (raisins in chocolate) >> 169 >> upper 200 >> 400.  ReliOn meter.  -+  CKD-sees nephrology., last BUN/creatinine:  Lab Results  Component Value Date   BUN 23 05/14/2023   CREATININE 1.32 (H) 05/14/2023   No MAU: Lab Results  Component Value Date   MICRALBCREAT 1.0 05/14/2023   MICRALBCREAT 1.0 11/18/2022   MICRALBCREAT 4 03/28/2016   MICRALBCREAT 0.6 11/16/2015   MICRALBCREAT 0.6 01/26/2013   MICRALBCREAT 0.3 03/24/2012   MICRALBCREAT 0.4 03/18/2011   MICRALBCREAT 11.4 10/29/2009   MICRALBCREAT 3.4 04/09/2009   MICRALBCREAT 17.1 01/25/2009  06/03/2021 (CCK): <4  GFR: Lab Results  Component Value Date   GFRNONAA 29 (L) 11/25/2018   GFRNONAA 45 (L) 04/21/2017   GFRNONAA 54 (L) 02/27/2017   GFRNONAA 35 (L) 02/17/2017   GFRNONAA 52.79 (L) 06/19/2010   GFRNONAA 59.68 10/29/2009   GFRNONAA 53.52 07/03/2009   GFRNONAA 59.78  04/09/2009   GFRNONAA 59.82 01/25/2009   GFRNONAA 78 05/08/2008  On Lisinopril  10.  -+ HL; last set of lipids: Lab Results  Component Value Date   CHOL 109 05/14/2023   HDL 34.60 (L) 05/14/2023   LDLCALC 49 05/14/2023   LDLDIRECT 149.6 01/26/2013   TRIG 126.0 05/14/2023   CHOLHDL 3 05/14/2023  On Lipitor 40 >> 80,  - last eye exam was on 10/28/2023. No DR reportedly. She had cataract surgery OS in 01/2023.  - no numbness and tingling in her feet.  She sees podiatry for ingrown toenails.  Last foot exam 03/10/2023.  She also has a history of HTN. 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  ROS: + See HPI  I reviewed pt's medications, allergies, PMH, social hx, family hx, and changes were documented in the history of present illness. Otherwise, unchanged from my initial visit note.  Past Medical History:  Diagnosis Date   Diabetes mellitus    Hyperlipidemia    Hypertension    Murmur    Past Surgical History:  Procedure Laterality Date   ABDOMINAL HYSTERECTOMY     BACK SURGERY     CHOLECYSTECTOMY     TONSILLECTOMY     TOTAL KNEE ARTHROPLASTY     TOTAL KNEE ARTHROPLASTY Right 02/26/2017   Procedure: RIGHT TOTAL KNEE ARTHROPLASTY;  Surgeon: Orvan Blanch, MD;  Location: [facility];  Service: Orthopedics;  Laterality: Right;   Social History   Socioeconomic History   Marital status: Widowed    Spouse name: Not on file   Number of children: Not on file   Years of education: Not on file   Highest education level: Associate degree: occupational, Scientist, product/process development, or vocational program  Occupational History   Not on file  Tobacco Use   Smoking status: Never   Smokeless tobacco: Never  Vaping Use   Vaping status: Never Used  Substance and Sexual Activity   Alcohol use: Yes    Comment: rare   Drug use: No   Sexual activity: Not Currently    Partners: Male  Other Topics Concern   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   Caffeine use: coffee in the  am   Social Drivers of Health   Financial Resource Strain: Medium Risk (05/13/2023)   Overall Financial Resource Strain (CARDIA)    Difficulty of Paying Living Expenses: Somewhat hard  Food Insecurity: No Food Insecurity (05/13/2023)   Hunger Vital Sign    Worried About Running Out of Food in the Last Year: Never true    Ran Out of Food in the Last Year: Never true  Transportation Needs: No Transportation Needs (05/13/2023)  PRAPARE - Administrator, Civil Service (Medical): No    Lack of Transportation (Non-Medical): No  Physical Activity: Insufficiently Active (05/13/2023)   Exercise Vital Sign    Days of Exercise per Week: 3 days    Minutes of Exercise per Session: 20 min  Stress: No Stress Concern Present (05/13/2023)   [employer] of Occupational Health - Occupational Stress Questionnaire    Feeling of Stress : Only a little  Social Connections: Moderately Integrated (05/13/2023)   Social Connection and Isolation Panel [NHANES]    Frequency of Communication with Friends and Family: More than three times a week    Frequency of Social Gatherings with Friends and Family: More than three times a week    Attends Religious Services: More than 4 times per year    Active Member of [employer] or Organizations: Yes    Attends Banker Meetings: More than 4 times per year    Marital Status: Widowed  Intimate Partner Violence: Not At Risk (01/13/2023)   Received from [facility]   HITS    Over the last 12 months how often did your partner physically hurt you?: Never    Over the last 12 months how often did your partner insult you or talk down to you?: Never    Over the last 12 months how often did your partner threaten you with physical harm?: Never    Over the last 12 months how often did your partner scream or curse at you?: Never   Current Outpatient Medications on File Prior to Visit  Medication Sig Dispense Refill   acetaminophen  (TYLENOL )  650 MG CR tablet Take 650 mg by mouth every 8 (eight) hours as needed for pain.     alendronate  (FOSAMAX ) 70 MG tablet Take 1 tablet (70 mg total) by mouth every 7 (seven) days. Take with a full glass of water on an empty stomach. 12 tablet 3   Ascorbic Acid (VITAMIN C) 100 MG tablet Take 100 mg by mouth daily.     aspirin  EC 81 MG tablet Take 81 mg by mouth daily.     atorvastatin  (LIPITOR) 80 MG tablet Take 1 tablet (80 mg total) by mouth daily. 90 tablet 3   cholecalciferol (VITAMIN D3) 25 MCG (1000 UNIT) tablet Take 2,000 Units by mouth daily.     citalopram  (CELEXA ) 40 MG tablet TAKE 1 TABLET EVERY DAY 90 tablet 3   Continuous Glucose Sensor (FREESTYLE LIBRE 3 SENSOR) MISC 1 each by Does not apply route every 14 (fourteen) days. 6 each 3   docusate sodium  (COLACE) 100 MG capsule Take 1 capsule (100 mg total) by mouth 2 (two) times daily as needed for mild constipation. 30 capsule 1   Dulaglutide  (TRULICITY ) 1.5 MG/0.5ML SOPN Inject 1.5 mg into the skin once a week. (Patient not taking: Reported on 06/10/2023) 8 mL 2   Dulaglutide  (TRULICITY ) 3 MG/0.5ML SOPN Inject 3 mg as directed once a week. (Patient not taking: Reported on 06/10/2023) 6 mL 3   fluticasone  (FLONASE ) 50 MCG/ACT nasal spray Place 2 sprays into both nostrils daily. 16 g 6   glucose blood (ACCU-CHEK GUIDE) test strip USE 1 STRIP TO CHECK GLUCOSE ONCE DAILY 100 each 4   Insulin  Admin Supplies MISC Use to inject insulin  2 times a day. 180 each 0   insulin  aspart (NOVOLOG  FLEXPEN) 100 UNIT/ML FlexPen Inject 4-8 Units into the skin 2 (two) times daily before a meal. 15 mL 11  insulin  glargine (LANTUS  SOLOSTAR) 100 UNIT/ML Solostar Pen Inject 10-15 Units into the skin daily. 30 mL 3   Insulin  Pen Needle 32G X 4 MM MISC Use 3x a day     Lancets (ACCU-CHEK MULTICLIX) lancets Use as instructed to test once a day DX E11.51 100 each 1   lisinopril -hydrochlorothiazide  (ZESTORETIC ) 10-12.5 MG tablet Take 1 tablet by mouth daily. 90 tablet  3   Loratadine -Pseudoephedrine (CLARITIN -D 12 HOUR PO) Take 1 tablet by mouth daily as needed (sinus).      meloxicam  (MOBIC ) 7.5 MG tablet Take 1 tablet (7.5 mg total) by mouth daily. 90 tablet 3   metFORMIN  (GLUCOPHAGE -XR) 500 MG 24 hr tablet Take 1 tablet (500 mg total) by mouth 2 (two) times daily. 180 tablet 3   Multiple Vitamins-Minerals (MULTIVITAMIN ADULT PO) Take 1 tablet by mouth daily.      omeprazole  (PRILOSEC) 20 MG capsule TAKE 1 CAPSULE BY MOUTH DAILY 90 capsule 1   traZODone  (DESYREL ) 50 MG tablet TAKE 1/2 TO 1 TABLET BY MOUTH AT BEDTIME AS NEEDED. FOR SLEEP 90 tablet 3   vitamin B-12 (CYANOCOBALAMIN ) 100 MCG tablet Take 100 mcg by mouth daily.     zinc gluconate 50 MG tablet Take 50 mg by mouth daily.     No current facility-administered medications on file prior to visit.   Allergies  Allergen Reactions   Ozempic  (0.25 Or 0.5 Mg-Dose) [Semaglutide (0.25 Or 0.5mg -Dos)] Diarrhea and Nausea And Vomiting    Pt stated, "I got dehydrated from this as well"   Family History  Problem Relation Age of Onset   Arthritis Mother    Cirrhosis Father    Alcohol abuse Father    Hypertension Sister    Arthritis Sister    Alzheimer's disease Sister    Throat cancer Brother    Heart disease Brother        MI   Cancer Brother 80       throat,    Hyperlipidemia Brother    Heart disease Brother    Cancer Brother    Cancer Brother    Heart disease Brother    Diabetes Brother    Diabetes Brother    Cancer Brother 50       melanoma   Diabetes Brother    Hypertension Brother    Hyperlipidemia Brother    Melanoma Other    Arthritis Other    PE: BP 122/70   Pulse 68   Ht 5\' 2"  (1.575 m)   Wt 199 lb 9.6 oz (90.5 kg)   SpO2 90%   BMI 36.51 kg/m  Wt Readings from Last 3 Encounters:  11/04/23 199 lb 9.6 oz (90.5 kg)  06/10/23 191 lb 6.4 oz (86.8 kg)  05/21/23 190 lb 9.6 oz (86.5 kg)   Constitutional: overweight, in NAD Eyes:EOMI, no exophthalmos ENT: no thyromegaly, no  cervical lymphadenopathy Cardiovascular: RRR, No MRG Respiratory: CTA B Musculoskeletal: no deformities Skin: no rashes Neurological: no tremor with outstretched hands  ASSESSMENT: 1. DM2, insulin -dependent, uncontrolled, with long-term complications: - CKD  2. HL   3. Obesity class II  PLAN:  1. Patient with longstanding, uncontrolled, type 2 diabetes, on oral antidiabetic regimen with metformin  and daily long-acting insulin  to which we added rapid acting insulin  last visit.  At that time, she was off GLP-1 receptor agonist for 2 weeks due to fatigue and she felt better off the medication so we did not restart it.  Sugars were fluctuating around the upper limit of the target  range but with significant hyperglycemic spikes after certain meals, despite reducing carbs and eating better quality starches.  Therefore, we added low-dose NovoLog  before larger meals.  We did not change the rest of the regimen.  We discussed about diet including the best route to eat and also to eat carbs at the end rather than at the beginning of the meal. CGM interpretation: -At today's visit, we reviewed her CGM downloads: It appears that 35% of values are in target range (goal >70%), while 65% are higher than 180 (goal <25%), and 0% are lower than 70 (goal <4%).  The calculated average blood sugar is 212.  The projected HbA1c for the next 3 months (GMI) is 8.4%. -Reviewing the CGM trends, sugars appear to be extremely fluctuating, almost in a sawtooth pattern throughout the day and also with high blood sugars at night usually.  She is taking NovoLog  before larger meals but I agree with her that she does need to take this before all meals.  We discussed about how to vary the dose depending on the size and consistency of the meals.  For some meals he she may definitely need higher doses than 8 units, which she is currently taking.  Will also increase the Lantus  dose.  I wonder if we could try maybe use Mounjaro  at next  visit. - I suggested to:  Patient Instructions  Please continue: - Metformin  1000 mg in am  Please increase: - Lantus  22 units daily at night - NovoLog  8-14 units 15-30 min before larger meals   Please return in 3-4 months.  - we checked her HbA1c: 8.7% (higher) - advised to check sugars at different times of the day - 4x a day, rotating check times - advised for yearly eye exams >> she is UTD - she is planning to see podiatry again as she has swelling behind her left ankle. - return to clinic in 3-4 months   2. HL - Latest lipid panel from 04/2023 showed an excellent LDL, drastically improved, triglycerides at goal, and HDL slightly low: Lab Results  Component Value Date   CHOL 109 05/14/2023   HDL 34.60 (L) 05/14/2023   LDLCALC 49 05/14/2023   LDLDIRECT 149.6 01/26/2013   TRIG 126.0 05/14/2023   CHOLHDL 3 05/14/2023  - She is on Lipitor 80 mg daily without side effects.  She was previously on fenofibrate , now off.  3.  Obesity class II -She not tolerate Ozempic  due to nausea and vomiting and since last visit, she also had to stop Trulicity  due to fatigue. - She lost 5 pounds before last visit despite coming off Trulicity .  We continued with a GLP-1 receptor agonists. - She gained 8 pounds since last visit  Emilie Harden, MD PhD [facility]

## 2023-11-08 ENCOUNTER — Other Ambulatory Visit: Payer: Self-pay | Admitting: Family Medicine

## 2023-11-08 ENCOUNTER — Other Ambulatory Visit: Payer: Self-pay | Admitting: Internal Medicine

## 2023-11-08 DIAGNOSIS — F32A Depression, unspecified: Secondary | ICD-10-CM

## 2023-11-08 DIAGNOSIS — I10 Essential (primary) hypertension: Secondary | ICD-10-CM

## 2023-11-18 ENCOUNTER — Other Ambulatory Visit (HOSPITAL_BASED_OUTPATIENT_CLINIC_OR_DEPARTMENT_OTHER): Payer: Self-pay | Admitting: Family Medicine

## 2023-11-18 DIAGNOSIS — Z1231 Encounter for screening mammogram for malignant neoplasm of breast: Secondary | ICD-10-CM

## 2023-11-19 ENCOUNTER — Encounter: Payer: Medicare Other | Admitting: Family Medicine

## 2023-11-26 ENCOUNTER — Encounter: Payer: Self-pay | Admitting: Family Medicine

## 2023-11-26 ENCOUNTER — Ambulatory Visit: Admitting: Family Medicine

## 2023-11-26 VITALS — BP 134/68 | HR 65 | Temp 98.2°F | Resp 18 | Ht 62.0 in | Wt 203.0 lb

## 2023-11-26 DIAGNOSIS — E538 Deficiency of other specified B group vitamins: Secondary | ICD-10-CM | POA: Diagnosis not present

## 2023-11-26 DIAGNOSIS — F32A Depression, unspecified: Secondary | ICD-10-CM

## 2023-11-26 DIAGNOSIS — R011 Cardiac murmur, unspecified: Secondary | ICD-10-CM

## 2023-11-26 DIAGNOSIS — E1122 Type 2 diabetes mellitus with diabetic chronic kidney disease: Secondary | ICD-10-CM | POA: Diagnosis not present

## 2023-11-26 DIAGNOSIS — Z7985 Long-term (current) use of injectable non-insulin antidiabetic drugs: Secondary | ICD-10-CM

## 2023-11-26 DIAGNOSIS — E782 Mixed hyperlipidemia: Secondary | ICD-10-CM

## 2023-11-26 DIAGNOSIS — N1832 Chronic kidney disease, stage 3b: Secondary | ICD-10-CM

## 2023-11-26 DIAGNOSIS — I10 Essential (primary) hypertension: Secondary | ICD-10-CM

## 2023-11-26 DIAGNOSIS — Z0000 Encounter for general adult medical examination without abnormal findings: Secondary | ICD-10-CM

## 2023-11-26 DIAGNOSIS — E559 Vitamin D deficiency, unspecified: Secondary | ICD-10-CM

## 2023-11-26 DIAGNOSIS — I700 Atherosclerosis of aorta: Secondary | ICD-10-CM

## 2023-11-26 DIAGNOSIS — R0609 Other forms of dyspnea: Secondary | ICD-10-CM

## 2023-11-26 DIAGNOSIS — G4700 Insomnia, unspecified: Secondary | ICD-10-CM

## 2023-11-26 DIAGNOSIS — E785 Hyperlipidemia, unspecified: Secondary | ICD-10-CM

## 2023-11-26 DIAGNOSIS — I2510 Atherosclerotic heart disease of native coronary artery without angina pectoris: Secondary | ICD-10-CM

## 2023-11-26 LAB — CBC WITH DIFFERENTIAL/PLATELET
Basophils Absolute: 0 10*3/uL (ref 0.0–0.1)
Basophils Relative: 0.6 % (ref 0.0–3.0)
Eosinophils Absolute: 0.2 10*3/uL (ref 0.0–0.7)
Eosinophils Relative: 3.1 % (ref 0.0–5.0)
HCT: 36.6 % (ref 36.0–46.0)
Hemoglobin: 12.2 g/dL (ref 12.0–15.0)
Lymphocytes Relative: 17.3 % (ref 12.0–46.0)
Lymphs Abs: 1.2 10*3/uL (ref 0.7–4.0)
MCHC: 33.2 g/dL (ref 30.0–36.0)
MCV: 86.2 fl (ref 78.0–100.0)
Monocytes Absolute: 0.3 10*3/uL (ref 0.1–1.0)
Monocytes Relative: 4.8 % (ref 3.0–12.0)
Neutro Abs: 5 10*3/uL (ref 1.4–7.7)
Neutrophils Relative %: 74.2 % (ref 43.0–77.0)
Platelets: 235 10*3/uL (ref 150.0–400.0)
RBC: 4.24 Mil/uL (ref 3.87–5.11)
RDW: 14.6 % (ref 11.5–15.5)
WBC: 6.8 10*3/uL (ref 4.0–10.5)

## 2023-11-26 LAB — LIPID PANEL
Cholesterol: 170 mg/dL (ref 0–200)
HDL: 55.3 mg/dL (ref >39.00)
LDL Cholesterol: 93 mg/dL (ref 0–99)
NonHDL: 115.18
Total CHOL/HDL Ratio: 3
Triglycerides: 113 mg/dL (ref 0.0–149.0)
VLDL: 22.6 mg/dL (ref 0.0–40.0)

## 2023-11-26 LAB — COMPREHENSIVE METABOLIC PANEL WITH GFR
ALT: 13 U/L (ref 0–35)
AST: 16 U/L (ref 0–37)
Albumin: 4.1 g/dL (ref 3.5–5.2)
Alkaline Phosphatase: 88 U/L (ref 39–117)
BUN: 23 mg/dL (ref 6–23)
CO2: 25 meq/L (ref 19–32)
Calcium: 9 mg/dL (ref 8.4–10.5)
Chloride: 104 meq/L (ref 96–112)
Creatinine, Ser: 1.11 mg/dL (ref 0.40–1.20)
GFR: 48.37 mL/min — ABNORMAL LOW (ref >60.00)
Glucose, Bld: 107 mg/dL — ABNORMAL HIGH (ref 70–99)
Potassium: 4 meq/L (ref 3.5–5.1)
Sodium: 138 meq/L (ref 135–145)
Total Bilirubin: 0.5 mg/dL (ref 0.2–1.2)
Total Protein: 6.7 g/dL (ref 6.0–8.3)

## 2023-11-26 LAB — VITAMIN D 25 HYDROXY (VIT D DEFICIENCY, FRACTURES): VITD: 42.48 ng/mL (ref 30.00–100.00)

## 2023-11-26 LAB — MICROALBUMIN / CREATININE URINE RATIO
Creatinine,U: 122.6 mg/dL
Microalb Creat Ratio: 50.8 mg/g — ABNORMAL HIGH (ref 0.0–30.0)
Microalb, Ur: 6.2 mg/dL — ABNORMAL HIGH (ref 0.0–1.9)

## 2023-11-26 LAB — VITAMIN B12: Vitamin B-12: 635 pg/mL (ref 211–911)

## 2023-11-26 MED ORDER — TIRZEPATIDE 2.5 MG/0.5ML ~~LOC~~ SOAJ: 2.5000 mg | SUBCUTANEOUS | 0 refills | Status: DC

## 2023-11-26 MED ORDER — TRAZODONE HCL 50 MG PO TABS: 25.0000 mg | ORAL_TABLET | Freq: Every evening | ORAL | 3 refills | Status: DC | PRN

## 2023-11-26 MED ORDER — CITALOPRAM HYDROBROMIDE 40 MG PO TABS: 40.0000 mg | ORAL_TABLET | Freq: Every day | ORAL | 1 refills | Status: DC

## 2023-11-26 MED ORDER — ATORVASTATIN CALCIUM 80 MG PO TABS: 80.0000 mg | ORAL_TABLET | Freq: Every day | ORAL | 3 refills | Status: AC

## 2023-11-26 MED ORDER — LISINOPRIL-HYDROCHLOROTHIAZIDE 10-12.5 MG PO TABS
1.0000 | ORAL_TABLET | Freq: Every day | ORAL | 1 refills | Status: DC
Start: 2023-11-26 — End: 2024-03-24

## 2023-11-26 NOTE — Progress Notes (Signed)
 Established Patient Office Visit  Subjective   Patient ID: Bethany Miller, female    DOB: 08-25-1945  Age: 78 y.o. MRN: [id number]  Chief Complaint  Patient presents with   Annual Exam    Pt states not fasting     HPI Discussed the use of AI scribe software for clinical note transcription with the patient, who gave verbal consent to proceed.  History of Present Illness Bethany Miller is a 78 year old female with diabetes and osteoporosis who presents for a routine follow-up visit.  She has been experiencing issues with blood sugar control, with her A1c increasing to over 8% from a previous level in the 6% range. Her NovoLog  dosage before meals and nighttime insulin  have been increased, and she continues to take metformin . She is concerned about weight gain, noting she is over 200 pounds again. She expresses frustration with the cost of diabetes medications, noting that her insurance does not cover certain injectables like Wegovy , which are expensive even after insurance.  She discusses her mental health, mentioning occasional crying without a known cause but denies feeling depressed. She is hesitant to change her medication due to past issues with adjustments and is currently taking clopram.  She has not been taking Fosamax  for her osteoporosis, expressing reluctance to add another pill to her regimen.  She mentions discomfort in her foot, specifically between her heel and the rest of her foot, and plans to consult her foot doctor about it.  She has a family history of shoulder replacement surgery, as her mother recently underwent the procedure. She is involved in her mother's care, helping her recover.  She mentions her son Bethany Miller and his wife Bethany Miller, discussing family dynamics and expressing concern over Melissa's behavior and spending habits.   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 12/06/2021   Other fatigue 03/08/2021   Numbness and tingling 03/08/2021   Dysuria 09/17/2020   Type 2 diabetes mellitus with diabetic polyneuropathy, without long-term current use of insulin  (HCC) 02/29/2020   Uncontrolled type 2 diabetes mellitus with hyperglycemia (HCC) 02/29/2020   Type 2 diabetes mellitus with stage 3b chronic kidney disease, without long-term current use of insulin  (HCC) 02/29/2020   Mixed hyperlipidemia 01/31/2020   Weakness 01/31/2020   Hip pain, bilateral 01/31/2020   low back pain with radiation 01/31/2020   Stiffness of finger joint of right hand 08/30/2019   Encounter for orthopedic follow-up care 08/16/2019   Carpal tunnel syndrome of right wrist 05/11/2019   Pain in right hand 05/10/2019   Primary osteoarthritis of right knee 02/26/2017   Right knee DJD 02/26/2017   Pre-operative clearance 01/09/2017   Bronchitis 11/11/2016   Obesity (BMI 30-39.9) 12/23/2013   Sinusitis 09/05/2013   Lower urinary tract infectious disease 03/05/2012   ANXIETY 09/26/2009   Acute sinusitis 09/26/2009   OTHER ACUTE REACTIONS TO STRESS 09/03/2009   Depression with anxiety 06/18/2009   Hyperlipidemia LDL goal <70 04/16/2009   DM (diabetes mellitus) type II uncontrolled, periph vascular disorder (HCC) 01/25/2009   MYALGIA 01/25/2009   HYPERGLYCEMIA, FASTING 05/08/2008   Essential hypertension, benign 01/17/2008   CHEST PAIN 01/17/2008   INSOMNIA 12/31/2007   MURMUR 12/31/2007   Past Medical History:  Diagnosis Date   Diabetes mellitus  Hyperlipidemia    Hypertension    Murmur    Past Surgical History:  Procedure Laterality Date   ABDOMINAL HYSTERECTOMY     BACK SURGERY     CHOLECYSTECTOMY     TONSILLECTOMY     TOTAL KNEE ARTHROPLASTY     TOTAL KNEE ARTHROPLASTY Right 02/26/2017   Procedure: RIGHT TOTAL KNEE  ARTHROPLASTY;  Surgeon: Orvan Blanch, MD;  Location: [facility];  Service: Orthopedics;  Laterality: Right;   Social History   Tobacco Use   Smoking status: Never   Smokeless tobacco: Never  Vaping Use   Vaping status: Never Used  Substance Use Topics   Alcohol use: Yes    Comment: rare   Drug use: No   Social History   Socioeconomic History   Marital status: Widowed    Spouse name: Not on file   Number of children: Not on file   Years of education: Not on file   Highest education level: Associate degree: academic program  Occupational History   Not on file  Tobacco Use   Smoking status: Never   Smokeless tobacco: Never  Vaping Use   Vaping status: Never Used  Substance and Sexual Activity   Alcohol use: Yes    Comment: rare   Drug use: No   Sexual activity: Not Currently    Partners: Male  Other Topics Concern   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   Caffeine use: coffee in the am   Social Drivers of Health   Financial Resource Strain: Medium Risk (11/25/2023)   Overall Financial Resource Strain (CARDIA)    Difficulty of Paying Living Expenses: Somewhat hard  Food Insecurity: No Food Insecurity (11/25/2023)   Hunger Vital Sign    Worried About Running Out of Food in the Last Year: Never true    Ran Out of Food in the Last Year: Never true  Transportation Needs: No Transportation Needs (11/25/2023)   PRAPARE - Administrator, Civil Service (Medical): No    Lack of Transportation (Non-Medical): No  Physical Activity: Insufficiently Active (11/25/2023)   Exercise Vital Sign    Days of Exercise per Week: 2 days    Minutes of Exercise per Session: 20 min  Stress: No Stress Concern Present (11/25/2023)   [employer] of Occupational Health - Occupational Stress Questionnaire    Feeling of Stress : Only a little  Social Connections: Moderately Integrated (11/25/2023)   Social Connection and Isolation Panel  [NHANES]    Frequency of Communication with Friends and Family: More than three times a week    Frequency of Social Gatherings with Friends and Family: More than three times a week    Attends Religious Services: More than 4 times per year    Active Member of [employer] or Organizations: Yes    Attends Banker Meetings: 1 to 4 times per year    Marital Status: Widowed  Intimate Partner Violence: Not At Risk (01/13/2023)   Received from [facility]   HITS    Over the last 12 months how often did your partner physically hurt you?: Never    Over the last 12 months how often did your partner insult you or talk down to you?: Never    Over the last 12 months how often did your partner threaten you with physical harm?: Never    Over the last 12 months how often did your partner scream or  curse at you?: Never   Family Status  Relation Name Status   Mother  Deceased       unknown cause --pt 5yo   Father  Deceased   Sister  Deceased   Sister Marilyn Shropshire Deceased   Brother  Alive   Brother  Deceased at age 1   Brother  Alive   Brother  Alive   Brother Robert Alive   Brother Medical laboratory scientific officer Alive   Brother PeeWee Deceased   Brother  Alive   Other  (Not Specified)   Other  (Not Specified)  No partnership data on file   Family History  Problem Relation Age of Onset   Arthritis Mother    Cirrhosis Father    Alcohol abuse Father    Hypertension Sister    Arthritis Sister    Alzheimer's disease Sister    Throat cancer Brother    Heart disease Brother        MI   Cancer Brother 80       throat,    Hyperlipidemia Brother    Heart disease Brother    Cancer Brother    Cancer Brother    Heart disease Brother    Diabetes Brother    Diabetes Brother    Cancer Brother 50       melanoma   Diabetes Brother    Hypertension Brother    Hyperlipidemia Brother    Melanoma Other    Arthritis Other    Allergies  Allergen Reactions   Ozempic  (0.25 Or 0.5 Mg-Dose)  [Semaglutide (0.25 Or 0.5mg -Dos)] Diarrhea and Nausea And Vomiting    Pt stated, "I got dehydrated from this as well"      ROS    Objective:     BP 134/68 (BP Location: Left Arm, Patient Position: Sitting, Cuff Size: Large)   Pulse 65   Temp 98.2 F (36.8 C) (Oral)   Resp 18   Ht 5\' 2"  (1.575 m)   Wt 203 lb (92.1 kg)   SpO2 95%   BMI 37.13 kg/m  BP Readings from Last 3 Encounters:  11/26/23 134/68  11/04/23 122/70  06/10/23 130/76   Wt Readings from Last 3 Encounters:  11/26/23 203 lb (92.1 kg)  11/04/23 199 lb 9.6 oz (90.5 kg)  06/10/23 191 lb 6.4 oz (86.8 kg)   SpO2 Readings from Last 3 Encounters:  11/26/23 95%  11/04/23 90%  06/10/23 94%      Physical Exam Vitals and nursing note reviewed.  Constitutional:      General: She is not in acute distress.    Appearance: Normal appearance. She is well-developed.  HENT:     Head: Normocephalic and atraumatic.  Eyes:     General: No scleral icterus.       Right eye: No discharge.        Left eye: No discharge.  Cardiovascular:     Rate and Rhythm: Normal rate and regular rhythm.     Heart sounds: No murmur heard. Pulmonary:     Effort: Pulmonary effort is normal. No respiratory distress.     Breath sounds: Normal breath sounds.  Musculoskeletal:        General: Normal range of motion.     Cervical back: Normal range of motion and neck supple.     Right lower leg: No edema.     Left lower leg: No edema.  Skin:    General: Skin is warm and dry.  Neurological:     General: No focal  deficit present.     Mental Status: She is alert and oriented to person, place, and time.  Psychiatric:        Mood and Affect: Mood normal.        Behavior: Behavior normal.        Thought Content: Thought content normal.        Judgment: Judgment normal.      No results found for any visits on 11/26/23.  Last CBC Lab Results  Component Value Date   WBC 7.8 05/14/2023   HGB 12.2 05/14/2023   HCT 37.6 05/14/2023    MCV 90.2 05/14/2023   MCH 29.4 03/08/2021   RDW 14.6 05/14/2023   PLT 225.0 05/14/2023   Last metabolic panel Lab Results  Component Value Date   GLUCOSE 140 (H) 05/14/2023   NA 138 05/14/2023   K 4.5 05/14/2023   CL 101 05/14/2023   CO2 29 05/14/2023   BUN 23 05/14/2023   CREATININE 1.32 (H) 05/14/2023   GFR 39.43 (L) 05/14/2023   CALCIUM  9.3 05/14/2023   PROT 6.6 05/14/2023   ALBUMIN 4.0 05/14/2023   BILITOT 0.7 05/14/2023   ALKPHOS 99 05/14/2023   AST 16 05/14/2023   ALT 15 05/14/2023   ANIONGAP 13 11/25/2018   Last lipids Lab Results  Component Value Date   CHOL 109 05/14/2023   HDL 34.60 (L) 05/14/2023   LDLCALC 49 05/14/2023   LDLDIRECT 149.6 01/26/2013   TRIG 126.0 05/14/2023   CHOLHDL 3 05/14/2023   Last hemoglobin A1c Lab Results  Component Value Date   HGBA1C 8.7 (A) 11/04/2023   Last thyroid  functions Lab Results  Component Value Date   TSH 3.01 11/14/2021   Last vitamin D  Lab Results  Component Value Date   VD25OH 49.00 05/14/2023   Last vitamin B12 and Folate Lab Results  Component Value Date   VITAMINB12 720 05/14/2023      The ASCVD Risk score (Arnett DK, et al., 2019) failed to calculate for the following reasons:   The valid total cholesterol range is 130 to 320 mg/dL    Assessment & Plan:   Problem List Items Addressed This Visit       Unprioritized   MURMUR   Relevant Medications   atorvastatin  (LIPITOR) 80 MG tablet   INSOMNIA   Relevant Medications   traZODone  (DESYREL ) 50 MG tablet   Type 2 diabetes mellitus with stage 3b chronic kidney disease, without long-term current use of insulin  (HCC)   Relevant Medications   atorvastatin  (LIPITOR) 80 MG tablet   lisinopril -hydrochlorothiazide  (ZESTORETIC ) 10-12.5 MG tablet   tirzepatide  (MOUNJARO ) 2.5 MG/0.5ML Pen   Other Relevant Orders   Microalbumin / creatinine urine ratio   DOE (dyspnea on exertion)   Relevant Medications   atorvastatin  (LIPITOR) 80 MG tablet    Coronary artery calcification   Relevant Medications   atorvastatin  (LIPITOR) 80 MG tablet   lisinopril -hydrochlorothiazide  (ZESTORETIC ) 10-12.5 MG tablet   tirzepatide  (MOUNJARO ) 2.5 MG/0.5ML Pen   Preventative health care - Primary   Ghm utd Check labs  See AVS Health Maintenance  Topic Date Due   Zoster Vaccines- Shingrix (1 of 2) 08/21/1966   DTaP/Tdap/Td (2 - Tdap) 11/27/2019   COVID-19 Vaccine (4 - 2024-25 season) 03/15/2023   Medicare Annual Wellness (AWV)  09/04/2023   OPHTHALMOLOGY EXAM  11/10/2023   INFLUENZA VACCINE  02/12/2024   FOOT EXAM  03/09/2024   HEMOGLOBIN A1C  05/05/2024   Diabetic kidney evaluation - eGFR measurement  05/13/2024  Diabetic kidney evaluation - Urine ACR  05/13/2024   Pneumonia Vaccine 77+ Years old  Completed   DEXA SCAN  Completed   Hepatitis C Screening  Completed   HPV VACCINES  Aged Out   Meningococcal B Vaccine  Aged Out   Colonoscopy  Discontinued   Fecal DNA (Cologuard)  Discontinued         Mixed hyperlipidemia   Encourage heart healthy diet such as MIND or DASH diet, increase exercise, avoid trans fats, simple carbohydrates and processed foods, consider a krill or fish or flaxseed oil cap daily.        Relevant Medications   atorvastatin  (LIPITOR) 80 MG tablet   lisinopril -hydrochlorothiazide  (ZESTORETIC ) 10-12.5 MG tablet   Other Relevant Orders   Lipid panel   CBC with Differential/Platelet   Comprehensive metabolic panel with GFR   Hyperlipidemia LDL goal <70   Encourage heart healthy diet such as MIND or DASH diet, increase exercise, avoid trans fats, simple carbohydrates and processed foods, consider a krill or fish or flaxseed oil cap daily.        Relevant Medications   atorvastatin  (LIPITOR) 80 MG tablet   lisinopril -hydrochlorothiazide  (ZESTORETIC ) 10-12.5 MG tablet   Essential hypertension, benign   Well controlled, no changes to meds. Encouraged heart healthy diet such as the DASH diet and exercise as  tolerated.        Relevant Medications   atorvastatin  (LIPITOR) 80 MG tablet   lisinopril -hydrochlorothiazide  (ZESTORETIC ) 10-12.5 MG tablet   Other Relevant Orders   Microalbumin / creatinine urine ratio   Vitamin B12   VITAMIN D  25 Hydroxy (Vit-D Deficiency, Fractures)   Aortic atherosclerosis (HCC)   Check labs        Relevant Medications   atorvastatin  (LIPITOR) 80 MG tablet   lisinopril -hydrochlorothiazide  (ZESTORETIC ) 10-12.5 MG tablet   tirzepatide  (MOUNJARO ) 2.5 MG/0.5ML Pen   Other Visit Diagnoses       Depression, unspecified depression type       Relevant Medications   citalopram  (CELEXA ) 40 MG tablet   traZODone  (DESYREL ) 50 MG tablet     Essential hypertension       Relevant Medications   atorvastatin  (LIPITOR) 80 MG tablet   lisinopril -hydrochlorothiazide  (ZESTORETIC ) 10-12.5 MG tablet     Vitamin D  deficiency       Relevant Orders   VITAMIN D  25 Hydroxy (Vit-D Deficiency, Fractures)     B12 deficiency       Relevant Orders   Vitamin B12     Assessment and Plan Assessment & Plan Type 2 diabetes mellitus with hyperglycemia   Her Type 2 diabetes is poorly controlled, with a recent A1c increase to 8.0. She struggles to maintain blood glucose levels despite increased NovoLog  and insulin  dosages. Financial constraints limit access to medications like Wegovy . Discussed potential coverage for Mounjaro  due to diabetes and coronary artery disease. She is willing to try Mounjaro  if affordable. Prescribe Mounjaro  and [facility]. Ensure blood work results are sent to the nephrologist before the next appointment.  Osteoporosis   Osteoporosis was previously managed with Fosamax , which she has discontinued. She is concerned about the long-term consequences of not taking Fosamax  but prefers to avoid additional medications.  5  No follow-ups on file.    Denese Mentink R Lowne Chase, DO

## 2023-11-26 NOTE — Assessment & Plan Note (Signed)
 Encourage heart healthy diet such as MIND or DASH diet, increase exercise, avoid trans fats, simple carbohydrates and processed foods, consider a krill or fish or flaxseed oil cap daily.

## 2023-11-26 NOTE — Assessment & Plan Note (Signed)
 Ghm utd Check labs  See AVS Health Maintenance  Topic Date Due   Zoster Vaccines- Shingrix (1 of 2) 08/21/1966   DTaP/Tdap/Td (2 - Tdap) 11/27/2019   COVID-19 Vaccine (4 - 2024-25 season) 03/15/2023   Medicare Annual Wellness (AWV)  09/04/2023   OPHTHALMOLOGY EXAM  11/10/2023   INFLUENZA VACCINE  02/12/2024   FOOT EXAM  03/09/2024   HEMOGLOBIN A1C  05/05/2024   Diabetic kidney evaluation - eGFR measurement  05/13/2024   Diabetic kidney evaluation - Urine ACR  05/13/2024   Pneumonia Vaccine 26+ Years old  Completed   DEXA SCAN  Completed   Hepatitis C Screening  Completed   HPV VACCINES  Aged Out   Meningococcal B Vaccine  Aged Out   Colonoscopy  Discontinued   Fecal DNA (Cologuard)  Discontinued

## 2023-11-26 NOTE — Assessment & Plan Note (Signed)
 hgba1c to be checked, minimize simple carbs. Increase exercise as tolerated. Continue current meds

## 2023-11-26 NOTE — Assessment & Plan Note (Signed)
 Well controlled, no changes to meds. Encouraged heart healthy diet such as the DASH diet and exercise as tolerated.

## 2023-11-26 NOTE — Assessment & Plan Note (Signed)
 Check labs

## 2023-11-30 ENCOUNTER — Ambulatory Visit: Payer: Self-pay | Admitting: Family Medicine

## 2023-12-15 ENCOUNTER — Ambulatory Visit

## 2023-12-28 ENCOUNTER — Encounter (HOSPITAL_BASED_OUTPATIENT_CLINIC_OR_DEPARTMENT_OTHER): Payer: Self-pay

## 2023-12-28 ENCOUNTER — Ambulatory Visit (HOSPITAL_BASED_OUTPATIENT_CLINIC_OR_DEPARTMENT_OTHER)
Admission: RE | Admit: 2023-12-28 | Discharge: 2023-12-28 | Disposition: A | Discharge status: Home / Self Care | Source: Ambulatory Visit | Attending: Family Medicine | Admitting: Family Medicine

## 2023-12-28 DIAGNOSIS — Z1231 Encounter for screening mammogram for malignant neoplasm of breast: Secondary | ICD-10-CM | POA: Diagnosis present

## 2024-01-18 ENCOUNTER — Encounter: Payer: Self-pay | Admitting: Pharmacist

## 2024-01-18 NOTE — Progress Notes (Signed)
 01/18/2024 Name: Bethany Miller MRN: [id number] DOB: [birth year]  Chief Complaint  Patient presents with   Medication Management    adherence    Bethany Miller is a 78 y.o. year old female who presented for a telephone visit.   They were referred to the pharmacist by a quality report for assistance in managing diabetes.    Subjective:  Care Team: Primary Care Provider: Antonio Meth, Jamee SAUNDERS, DO ; Next Scheduled Visit: not currently scheduled Endocrinologist Dr Trixie; Next Scheduled Visit: 02/17/2024  Medication Access/Adherence  Patient failed medication adherence measure for cholesterol medicaiton adherene in 2024. Clinical Pharmacist Practitioner has been following in 2025.   She has filled atorvastatin  for 90 day supply on 09/02/2023 and 11/26/2023 Also filled lisinopril  hydrochlorothiazide  on the same days.   Noticed she filled metformin  ER 500mg  for 90 day supply on 09/02/2023 but has not filled again. Patient states she has been taking metformin  twice a day all of 2025 but she has built up a surplus of medication and had not needed to fill again.   [facility]acy:  [id number] [place], [place] - [address] [place] [address] Phone: [phone] Fax: [phone]  [facility]) - [place], [place] - [address] [facility] [address] [place] [address] [place] [address] Phone: [phone] Fax: [phone]  [facility] - [place], [place] - [address] [place] [address] Phone: [phone] Fax: [phone]   Patient reports affordability concerns with their medications: Yes  Patient reports access/transportation concerns to their pharmacy: No  Patient reports adherence concerns with their medications:  Yes  - never started Mounjaro  due to cost   Diabetes:  Current medications:  Lantus  .  Insulin  glargine - inject 22 units once a day Insulin  aspart - inject 8 to 14 units prior to large meals - patient states she has not needed to take insulin  aspart in the last 2 weeks.  Metformin  ER 500mg  - take 2 tablets = 1000mg  daily  She was prescribed Mounjaro  in May 2025 but never started due to cost - patient reports cost was $400 - 500.   Medications tried in the past:  Ozempic  - caused nausea and vomiting Jardiance , Invokana  - not affordable Glipizide  and glimepiride  - low blood glucose Tradjenta , Januvia  and Alogliptin  - not affordable or not covered by insurance.  Cycloset  - not affordable Trulicity  - received from [facility] in the past but stopped due causing fatigue  Current glucose readings: Per patient 100 to 140's over the last 2 weeks GMI is currently 7.0% Using [facility] 3 sensors.   Current meal patterns:  Reports she has been choosing better foods / vegetables and limiting serving sizes. She has noticed improved blood glucose and has used short acting insulin  less.   Current medication access support: none   Objective:  Lab Results  Component Value Date   HGBA1C 8.7 (A) 11/04/2023    Lab Results  Component Value Date   CREATININE 1.11 11/26/2023   BUN 23 11/26/2023   NA 138 11/26/2023   K 4.0 11/26/2023   CL 104 11/26/2023   CO2 25 11/26/2023    Lab Results  Component Value Date   CHOL 170 11/26/2023   HDL 55.30 11/26/2023   LDLCALC 93 11/26/2023   LDLDIRECT 149.6 01/26/2013   TRIG 113.0 11/26/2023  CHOLHDL 3 11/26/2023   Current Outpatient Medications  Medication Instructions   acetaminophen  (TYLENOL ) 650 mg, Every 8 hours PRN   aspirin  EC 81 mg, Daily   atorvastatin  (LIPITOR) 80 mg, Oral, Daily   cholecalciferol (VITAMIN D3) 2,000 Units, Daily   citalopram  (CELEXA ) 40 mg, Oral, Daily   Continuous Glucose Sensor (FREESTYLE LIBRE 3 SENSOR) MISC 1 each, Does not apply, Every 14 days   docusate sodium  (COLACE) 100 mg, Oral, 2 times  daily PRN   fluticasone  (FLONASE ) 50 MCG/ACT nasal spray 2 sprays, Each Nare, Daily   glucose blood (ACCU-CHEK GUIDE) test strip USE 1 STRIP TO CHECK GLUCOSE ONCE DAILY   Insulin  Admin Supplies MISC Use to inject insulin  2 times a day.   Insulin  Pen Needle 32G X 4 MM MISC Use 3x a day   Lancets (ACCU-CHEK MULTICLIX) lancets Use as instructed to test once a day DX E11.51   Lantus  SoloStar 22-25 Units, Subcutaneous, Daily   lisinopril -hydrochlorothiazide  (ZESTORETIC ) 10-12.5 MG tablet 1 tablet, Oral, Daily   Loratadine -Pseudoephedrine (CLARITIN -D 12 HOUR PO) 1 tablet, Daily PRN   metFORMIN  (GLUCOPHAGE -XR) 500 mg, Oral, 2 times daily   Multiple Vitamins-Minerals (MULTIVITAMIN ADULT PO) 1 tablet, Daily   NovoLOG  FlexPen 8-14 Units, Subcutaneous, 3 times daily with meals   tirzepatide  (MOUNJARO ) 2.5 mg, Subcutaneous, Weekly   traZODone  (DESYREL ) 25-50 mg, Oral, At bedtime PRN, for sleep   vitamin B-12 (CYANOCOBALAMIN ) 100 mcg, Daily   vitamin C 100 mg, Daily   zinc gluconate 50 mg, Daily      Assessment/Plan:   Diabetes: Not at goal per last office A1c but recent Continuous Glucose Monitor GMI shows blood glucose improved.  - Recommend to continue current insulin  regimen pre Dr Ara instructions.   - Recommend to check glucose 3 to 4 times per day with Continuous Glucose Monitor.   Medication Access: - Checked her [employer] retiree plan - Her Part D is thru [facility]. She has a $590 deductible to meet, then tier 3 medications like Mounjaro  would be 25% of medication cost. Spoke with her insurance company and she has only met $336.82 of her $590 deductible. The first time she fills Mounjaro  the cost will be about $516 ($254 for rest of deductible + $262 for 25% of medication cost as copay). After that cost would be $262 per month. Patient states this cost is too high. There is not currently a medication assistance program for Mounjaro  and no open funding thru [employer] to help with  diabetes medications.    Follow Up Plan: 2 to 3 months to review adherence.  Madelin Ray, PharmD Clinical Pharmacist [facility]

## 2024-01-27 ENCOUNTER — Telehealth: Payer: Self-pay | Admitting: Family Medicine

## 2024-01-27 NOTE — Telephone Encounter (Signed)
 Copied from CRM [id number]. Topic: Medicare AWV >> Jan 27, 2024  2:44 PM Nathanel DEL wrote: Reason for CRM: Called LVM 01/27/2024 to schedule AWV. Please schedule Virtual or Telehealth visits ONLY.   Nathanel Paschal; Care Guide Ambulatory Clinical Support [place] [facility] Direct Dial: [phone]

## 2024-02-17 ENCOUNTER — Ambulatory Visit: Admitting: Internal Medicine

## 2024-03-02 ENCOUNTER — Encounter: Payer: Self-pay | Admitting: Pharmacist

## 2024-03-02 NOTE — Progress Notes (Signed)
 Pharmacy Quality Measure Review  Clinical Pharmacist Practitioner has been following adherence for 2025 for cholesterol (statin), diabetes, and hypertension (ACEi/ARB) medications. Patient failed medication adherence measure for cholesterol medicaiton in 2024.   Medication: atorvastatin   Last fill date: 02/04/2024 for 90 day supply. Patient currently has filled atorvastatin  on time thru 2025  Medication: lisinopril  HCTZ Last fill date: 02/04/2024 for 90 day supply. Patient currently has filled atorvastatin  on time thru 2025  Medication: metformin   Last fill date: 02/04/2024 for 90 day supply however she also filled only once previously on 09/02/2023 for 90 days and she missed 65 days between fills. She reported in early July she was taking metformin  as prescribed but somehow had a surplus of medications. She is taking insulin  so she should be excluded from MAD measure in 2025.    No intervention at this time since she filled all 3 medications 02/04/2024. She has 1 refill remaining for atorvastatin , 2 refills remaining for metformin  and 0 refills remaining for lisinopril -hydrochlorothiazide .  No current follow up scheduled with PCP but patient will see endo in September 2025.  Will send PCP note to see when she would recommend follow up.   Madelin Ray, PharmD Clinical Pharmacist [facility]

## 2024-03-05 ENCOUNTER — Other Ambulatory Visit: Payer: Self-pay | Admitting: Internal Medicine

## 2024-03-07 ENCOUNTER — Other Ambulatory Visit: Payer: Self-pay

## 2024-03-07 MED ORDER — FREESTYLE LIBRE 3 PLUS SENSOR MISC: 1.0000 | 3 refills | Status: AC

## 2024-03-16 ENCOUNTER — Ambulatory Visit: Admitting: Internal Medicine

## 2024-03-16 ENCOUNTER — Encounter: Payer: Self-pay | Admitting: Internal Medicine

## 2024-03-16 VITALS — BP 150/60 | HR 65 | Ht 62.0 in | Wt 207.8 lb

## 2024-03-16 DIAGNOSIS — E785 Hyperlipidemia, unspecified: Secondary | ICD-10-CM

## 2024-03-16 DIAGNOSIS — N1832 Chronic kidney disease, stage 3b: Secondary | ICD-10-CM | POA: Diagnosis not present

## 2024-03-16 DIAGNOSIS — E669 Obesity, unspecified: Secondary | ICD-10-CM | POA: Diagnosis not present

## 2024-03-16 DIAGNOSIS — Z794 Long term (current) use of insulin: Secondary | ICD-10-CM | POA: Diagnosis not present

## 2024-03-16 DIAGNOSIS — E1122 Type 2 diabetes mellitus with diabetic chronic kidney disease: Secondary | ICD-10-CM | POA: Diagnosis not present

## 2024-03-16 NOTE — Progress Notes (Addendum)
 Patient ID: Bethany Miller, female   DOB: [birth year], 78 y.o.   MRN: [id number]  HPI: Bethany Miller is a 78 y.o.-year-old female, presenting for f/u for DM2, dx 2010, insulin -dependent, uncontrolled, with complications (CKD).  Last visit 4 months ago. She is here with her son who offers part of the history especially related to her insulin  dosing and meals. She has [employer] part D for meds, [employer], and [employer] for the rest.   Interim history: No increased urination, chest pain, nausea.  Reviewed HbA1c levels: Lab Results  Component Value Date   HGBA1C 8.7 (A) 11/04/2023   HGBA1C 7.8 (A) 06/10/2023   HGBA1C 8.6 (H) 05/14/2023   HGBA1C 8.8 (A) 03/10/2023   HGBA1C 6.6 (A) 09/09/2022   HGBA1C 7.0 (A) 05/01/2022   HGBA1C 6.9 (A) 01/21/2022   HGBA1C 6.3 (A) 09/03/2021   HGBA1C 7.3 (A) 04/16/2021   HGBA1C 7.0 (A) 12/07/2020   HGBA1C 8.1 (A) 08/31/2020   HGBA1C 7.4 (A) 02/28/2020   HGBA1C 9.2 (H) 10/06/2019   HGBA1C 8.6 (H) 04/01/2019   HGBA1C 10.4 (H) 11/30/2018   HGBA1C 9.5 (A) 08/26/2018   HGBA1C 9.8 (A) 03/31/2018   HGBA1C 8.4 11/26/2017   HGBA1C 7.0 07/30/2017   HGBA1C 7.6 (H) 04/21/2017   Previously on: - Metformin  ER 1000 mg 2x a day (2000 mg with dinner did not change the sugars) >> 1000 mg with breakfast - Glipizide  5 >> 10 mg 2x a day before breakfast and dinner - >> stopped 02/2020 by Dr. Sam We tried Ozempic  but she developed nausea and vomiting and ended up in the emergency room in 11/2018. We again tried Jardiance   - added again 04/2018 >> not affordable Stopped  Glipizide  5 mg 2x a day 06/2017 b/c lows, but restarted afterwards. Tradjenta  and Alogliptin  were not covered She was previously on Invokana , Jardiance  10 mg daily >> too expensive. Tried Cycloset  >> too expensive Tried Januvia  >>  too expensive.  Tried Metformin  500 mg po >> severe diarrhea. She was on Glimepiride  >> hypoglycemia in the 60's on 2 mg daily. She had rapid acting insulin  with  steroid shots before.   Now on: - Metformin  ER 500 mg 2x a day >> .SABRASABRA500 mg in am >> 500 mg 2-4x a day (occas. diarrhea) >> 1000 mg in am - Lantus  10-12 units in HS - added back 04/2021 >> 12 >> 15 >> 10-12 >> 15 >> 18 >> 10-18 >> 18 >> 22 units daily - NovoLog  4-8 units 15 min before larger meals >> now before L and D >> 8-14 units before each meal >> NOT TAKING! Previously on glipizide  5 mg before a larger dinner. She was previously on Jardiance  but this caused increased urination and vulvar irritation so she stopped. She stopped Trulicity  in 05/2023 due to fatigue. She could not tolerate Ozempic  due to nausea and vomiting.  Pt checks her sugars >4x a day - CGM:  Previously:  Previously:   Lowest sugar was 56 x1 >> ...91 >> 60s;  she has hypoglycemia awareness in the 90s >> 70s. Highest sugar was 360 >>...upper 200 >> 400.  ReliOn meter.  -+ CKD-sees nephrology., last BUN/creatinine:  Lab Results  Component Value Date   BUN 23 11/26/2023   CREATININE 1.11 11/26/2023   No MAU: Lab Results  Component Value Date   MICRALBCREAT 50.8 (H) 11/26/2023   MICRALBCREAT 4 03/28/2016   MICRALBCREAT 11.4 10/29/2009   MICRALBCREAT 3.4 04/09/2009   MICRALBCREAT 17.1 01/25/2009  06/03/2021 (CCK): <  4  GFR: Lab Results  Component Value Date   GFRNONAA 29 (L) 11/25/2018   GFRNONAA 45 (L) 04/21/2017   GFRNONAA 54 (L) 02/27/2017   GFRNONAA 35 (L) 02/17/2017   GFRNONAA 52.79 (L) 06/19/2010   GFRNONAA 59.68 10/29/2009   GFRNONAA 53.52 07/03/2009   GFRNONAA 59.78 04/09/2009   GFRNONAA 59.82 01/25/2009   GFRNONAA 78 05/08/2008  On Lisinopril  10.  -+ HL; last set of lipids: Lab Results  Component Value Date   CHOL 170 11/26/2023   HDL 55.30 11/26/2023   LDLCALC 93 11/26/2023   LDLDIRECT 149.6 01/26/2013   TRIG 113.0 11/26/2023   CHOLHDL 3 11/26/2023  On Lipitor 40 >> 80,  - last eye exam was on 10/28/2023. No DR reportedly. She had cataract surgery OS in 01/2023.  - no  numbness and tingling in her feet.  She sees podiatry for ingrown toenails.  Last foot exam 03/10/2023.  She also has a history of HTN. 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  ROS: + See HPI  I reviewed pt's medications, allergies, PMH, social hx, family hx, and changes were documented in the history of present illness. Otherwise, unchanged from my initial visit note.  Past Medical History:  Diagnosis Date   Diabetes mellitus    Hyperlipidemia    Hypertension    Murmur    Past Surgical History:  Procedure Laterality Date   ABDOMINAL HYSTERECTOMY     BACK SURGERY     CHOLECYSTECTOMY     TONSILLECTOMY     TOTAL KNEE ARTHROPLASTY     TOTAL KNEE ARTHROPLASTY Right 02/26/2017   Procedure: RIGHT TOTAL KNEE ARTHROPLASTY;  Surgeon: Duwayne Purchase, MD;  Location: [facility];  Service: Orthopedics;  Laterality: Right;   Social History   Socioeconomic History   Marital status: Widowed    Spouse name: Not on file   Number of children: Not on file   Years of education: Not on file   Highest education level: Associate degree: academic program  Occupational History   Not on file  Tobacco Use   Smoking status: Never   Smokeless tobacco: Never  Vaping Use   Vaping status: Never Used  Substance and Sexual Activity   Alcohol use: Yes    Comment: rare   Drug use: No   Sexual activity: Not Currently    Partners: Male  Other Topics Concern   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   Caffeine use: coffee in the am   Social Drivers of Health   Financial Resource Strain: Medium Risk (11/25/2023)   Overall Financial Resource Strain (CARDIA)    Difficulty of Paying Living Expenses: Somewhat hard  Food Insecurity: No Food Insecurity (11/25/2023)   Hunger Vital Sign    Worried About Running Out of Food in the Last Year: Never true    Ran Out of Food in the Last Year: Never true  Transportation Needs: No Transportation Needs (11/25/2023)   PRAPARE - Therapist, art (Medical): No    Lack of Transportation (Non-Medical): No  Physical Activity: Insufficiently Active (11/25/2023)   Exercise Vital Sign    Days of Exercise per Week: 2 days    Minutes of Exercise per Session: 20 min  Stress: No Stress Concern Present (11/25/2023)   [employer] of Occupational Health - Occupational Stress Questionnaire    Feeling of Stress : Only a little  Social Connections: Moderately Integrated (11/25/2023)   Social Connection  and Isolation Panel    Frequency of Communication with Friends and Family: More than three times a week    Frequency of Social Gatherings with Friends and Family: More than three times a week    Attends Religious Services: More than 4 times per year    Active Member of [employer] or Organizations: Yes    Attends Banker Meetings: 1 to 4 times per year    Marital Status: Widowed  Intimate Partner Violence: Not At Risk (01/13/2023)   Received from [facility]   HITS    Over the last 12 months how often did your partner physically hurt you?: Never    Over the last 12 months how often did your partner insult you or talk down to you?: Never    Over the last 12 months how often did your partner threaten you with physical harm?: Never    Over the last 12 months how often did your partner scream or curse at you?: Never   Current Outpatient Medications on File Prior to Visit  Medication Sig Dispense Refill   acetaminophen  (TYLENOL ) 650 MG CR tablet Take 650 mg by mouth every 8 (eight) hours as needed for pain.     Ascorbic Acid (VITAMIN C) 100 MG tablet Take 100 mg by mouth daily.     aspirin  EC 81 MG tablet Take 81 mg by mouth daily.     atorvastatin  (LIPITOR) 80 MG tablet Take 1 tablet (80 mg total) by mouth daily. 90 tablet 3   cholecalciferol (VITAMIN D3) 25 MCG (1000 UNIT) tablet Take 2,000 Units by mouth daily.     citalopram  (CELEXA ) 40 MG tablet Take 1 tablet (40 mg total) by mouth daily. 90 tablet 1    Continuous Glucose Sensor (FREESTYLE LIBRE 3 PLUS SENSOR) MISC Inject 1 Device into the skin continuous. Change every 15 days 6 each 3   Continuous Glucose Sensor (FREESTYLE LIBRE 3 SENSOR) MISC 1 each by Does not apply route every 14 (fourteen) days. 6 each 3   docusate sodium  (COLACE) 100 MG capsule Take 1 capsule (100 mg total) by mouth 2 (two) times daily as needed for mild constipation. 30 capsule 1   fluticasone  (FLONASE ) 50 MCG/ACT nasal spray Place 2 sprays into both nostrils daily. 16 g 6   glucose blood (ACCU-CHEK GUIDE) test strip USE 1 STRIP TO CHECK GLUCOSE ONCE DAILY 100 each 4   Insulin  Admin Supplies MISC Use to inject insulin  2 times a day. 180 each 0   insulin  aspart (NOVOLOG  FLEXPEN) 100 UNIT/ML FlexPen Inject 8-14 Units into the skin 3 (three) times daily with meals. 30 mL 3   insulin  glargine (LANTUS  SOLOSTAR) 100 UNIT/ML Solostar Pen Inject 22-25 Units into the skin daily. 30 mL 3   Insulin  Pen Needle 32G X 4 MM MISC Use 3x a day     Lancets (ACCU-CHEK MULTICLIX) lancets Use as instructed to test once a day DX E11.51 100 each 1   lisinopril -hydrochlorothiazide  (ZESTORETIC ) 10-12.5 MG tablet Take 1 tablet by mouth daily. 90 tablet 1   Loratadine -Pseudoephedrine (CLARITIN -D 12 HOUR PO) Take 1 tablet by mouth daily as needed (sinus).      metFORMIN  (GLUCOPHAGE -XR) 500 MG 24 hr tablet TAKE 1 TABLET BY MOUTH TWICE  DAILY 180 tablet 3   Multiple Vitamins-Minerals (MULTIVITAMIN ADULT PO) Take 1 tablet by mouth daily.      tirzepatide  (MOUNJARO ) 2.5 MG/0.5ML Pen Inject 2.5 mg into the skin once a week. 2 mL 0  traZODone  (DESYREL ) 50 MG tablet Take 0.5-1 tablets (25-50 mg total) by mouth at bedtime as needed. for sleep 90 tablet 3   vitamin B-12 (CYANOCOBALAMIN ) 100 MCG tablet Take 100 mcg by mouth daily.     zinc gluconate 50 MG tablet Take 50 mg by mouth daily.     No current facility-administered medications on file prior to visit.   Allergies  Allergen Reactions   Ozempic   (0.25 Or 0.5 Mg-Dose) [Semaglutide (0.25 Or 0.5mg -Dos)] Diarrhea and Nausea And Vomiting    Pt stated, I got dehydrated from this as well   Family History  Problem Relation Age of Onset   Arthritis Mother    Cirrhosis Father    Alcohol abuse Father    Hypertension Sister    Arthritis Sister    Alzheimer's disease Sister    Throat cancer Brother    Heart disease Brother        MI   Cancer Brother 80       throat,    Hyperlipidemia Brother    Heart disease Brother    Cancer Brother    Cancer Brother    Heart disease Brother    Diabetes Brother    Diabetes Brother    Cancer Brother 50       melanoma   Diabetes Brother    Hypertension Brother    Hyperlipidemia Brother    Melanoma Other    Arthritis Other    PE: BP (!) 150/60   Pulse 65   Ht 5' 2 (1.575 m)   Wt 207 lb 12.8 oz (94.3 kg)   SpO2 91%   BMI 38.01 kg/m  Wt Readings from Last 3 Encounters:  03/16/24 207 lb 12.8 oz (94.3 kg)  11/26/23 203 lb (92.1 kg)  11/04/23 199 lb 9.6 oz (90.5 kg)   Constitutional: overweight, in NAD Eyes:EOMI, no exophthalmos ENT: no thyromegaly, no cervical lymphadenopathy Cardiovascular: RRR, No RG, + 1/6 SEM Respiratory: CTA B Musculoskeletal: no deformities Skin: no rashes Neurological: no tremor with outstretched hands Diabetic Foot Exam - Simple   Simple Foot Form Diabetic Foot exam was performed with the following findings: Yes 03/16/2024 10:35 AM  Visual Inspection No deformities, no ulcerations, no other skin breakdown bilaterally: Yes Sensation Testing Intact to touch and monofilament testing bilaterally: Yes Pulse Check Posterior Tibialis and Dorsalis pulse intact bilaterally: Yes Comments    ASSESSMENT: 1. DM2, insulin -dependent, uncontrolled, with long-term complications: - CKD  2. HL   3. Obesity class II  PLAN:  1. Patient with longstanding, uncontrolled, type 2 diabetes, on oral antidiabetic regimen with metformin  and also basal-bolus insulin , which we  increased at last visit due to worsening diabetes control.  Sugars were extremely fluctuating at that time, almost in a sawtooth pattern throughout the day and also with very high blood sugars at night.  She was taking NovoLog  before meals and we discussed about taking it before all of the meals and to vary the dose depending on the size and consistency of each meal.  We also increased her Lantus  dose.  I considered recommended Mounjaro  but we decided to do this only if sugars did not improve on the above regimen. - We previously discussed about diet including the best route to eat and also to eat carbs at the end rather than at the beginning of the meal. CGM interpretation: -At today's visit, we reviewed her CGM downloads: It appears that 44% of values are in target range (goal >70%), while 56% are higher than 180 (  goal <25%), and 0% are lower than 70 (goal <4%).  The calculated average blood sugar is 201.  The projected HbA1c for the next 3 months (GMI) is 8.1%. -Reviewing the CGM trends, sugars appear to still be quite fluctuating, improving slightly overnight and then increasing very abruptly after lunch and less so after dinner.  Upon questioning, however, she has not been taking NovoLog  for some time as she was afraid of hypoglycemia.  Upon reviewing the CGM patterns with the patient and her son, I explained the absolute need of starting NovoLog  but we probably need a lower dose.  I recommended to take between 6 and 10 units, starting at 6 units and increasing as needed.  I explained the lag time of taking NovoLog  approximately 15 minutes before meals, adjusting the dose based on the size of the meals, and also based on the blood sugar patterns.  Will continue Lantus  and metformin  at the same doses for now. - I suggested to:  Patient Instructions  Please continue: - Metformin  1000 mg in am - Lantus  22 units daily at night  Please add: - NovoLog  15 min before meals: 6-10 units    Please return in  1.5 months.  - we checked her HbA1c: 8.5% (slightly better) - advised to check sugars at different times of the day - 4x a day, rotating check times - advised for yearly eye exams >> she is UTD - her ACR was elevated at last visit -she is on lisinopril .  She is also seeing nephrology. - return to clinic in 1.5 months   2. HL - Latest lipid panel showed an improved LDL, but still above our target of less than 70 due to CKD, and the rest of the fractions at goal: Lab Results  Component Value Date   CHOL 170 11/26/2023   HDL 55.30 11/26/2023   LDLCALC 93 11/26/2023   LDLDIRECT 149.6 01/26/2013   TRIG 113.0 11/26/2023   CHOLHDL 3 11/26/2023  - She continues Lipitor 80 mg daily without side effects.  She was previously on fenofibrate  but came off.  3.  Obesity class II - She not tolerate Ozempic  due to nausea and vomiting and since last visit, she also had to stop Trulicity  due to fatigue. - She gained 8 pounds before last visit - She gained 4 pounds since last visit  Lela Fendt, MD PhD [facility] Endocrinology

## 2024-03-16 NOTE — Patient Instructions (Addendum)
 Please continue: - Metformin  1000 mg in am - Lantus  22 units daily at night  Please add: - NovoLog  15 min before meals: 6-10 units    Please return in 1.5 months.

## 2024-03-23 ENCOUNTER — Other Ambulatory Visit: Payer: Self-pay | Admitting: Family Medicine

## 2024-03-23 DIAGNOSIS — I10 Essential (primary) hypertension: Secondary | ICD-10-CM

## 2024-03-23 DIAGNOSIS — F32A Depression, unspecified: Secondary | ICD-10-CM

## 2024-04-27 ENCOUNTER — Ambulatory Visit: Admitting: Internal Medicine

## 2024-04-29 ENCOUNTER — Encounter: Payer: Self-pay | Admitting: Internal Medicine

## 2024-04-29 ENCOUNTER — Ambulatory Visit: Admitting: Internal Medicine

## 2024-04-29 VITALS — BP 134/76 | HR 60 | Resp 20 | Ht 62.0 in | Wt 206.8 lb

## 2024-04-29 DIAGNOSIS — E785 Hyperlipidemia, unspecified: Secondary | ICD-10-CM | POA: Diagnosis not present

## 2024-04-29 DIAGNOSIS — Z794 Long term (current) use of insulin: Secondary | ICD-10-CM | POA: Diagnosis not present

## 2024-04-29 DIAGNOSIS — E1122 Type 2 diabetes mellitus with diabetic chronic kidney disease: Secondary | ICD-10-CM

## 2024-04-29 DIAGNOSIS — N1832 Chronic kidney disease, stage 3b: Secondary | ICD-10-CM

## 2024-04-29 DIAGNOSIS — E669 Obesity, unspecified: Secondary | ICD-10-CM

## 2024-04-29 DIAGNOSIS — Z23 Encounter for immunization: Secondary | ICD-10-CM | POA: Diagnosis not present

## 2024-04-29 LAB — POCT GLYCOSYLATED HEMOGLOBIN (HGB A1C): Hemoglobin A1C: 7.9 % — AB (ref 4.0–5.6)

## 2024-04-29 NOTE — Patient Instructions (Addendum)
 Please continue: - Metformin  1000 mg in am - Lantus  22 units daily at night - NovoLog  15 min before meals: 6-10 units    Please return in 3 months.

## 2024-04-29 NOTE — Addendum Note (Signed)
 Addended by: ARELIA DIETRICH SAILOR on: 04/29/2024 03:27 PM   Modules accepted: Orders

## 2024-04-29 NOTE — Progress Notes (Signed)
 Patient ID: Bethany Miller, female   DOB: 09/29/1945, 78 y.o.   MRN: [id number]  HPI: Bethany Miller is a 78 y.o.-year-old female, presenting for f/u for DM2, dx 2010, insulin -dependent, uncontrolled, with complications (CKD).  Last visit 1.5 months ago. She has [employer] part D for meds, [employer], and [employer] for the rest.   Interim history: No increased urination, chest pain, nausea.  Reviewed HbA1c levels: 03/16/2024: HbA1c 8.5% Lab Results  Component Value Date   HGBA1C 8.7 (A) 11/04/2023   HGBA1C 7.8 (A) 06/10/2023   HGBA1C 8.6 (H) 05/14/2023   HGBA1C 8.8 (A) 03/10/2023   HGBA1C 6.6 (A) 09/09/2022   HGBA1C 7.0 (A) 05/01/2022   HGBA1C 6.9 (A) 01/21/2022   HGBA1C 6.3 (A) 09/03/2021   HGBA1C 7.3 (A) 04/16/2021   HGBA1C 7.0 (A) 12/07/2020   HGBA1C 8.1 (A) 08/31/2020   HGBA1C 7.4 (A) 02/28/2020   HGBA1C 9.2 (H) 10/06/2019   HGBA1C 8.6 (H) 04/01/2019   HGBA1C 10.4 (H) 11/30/2018   HGBA1C 9.5 (A) 08/26/2018   HGBA1C 9.8 (A) 03/31/2018   HGBA1C 8.4 11/26/2017   HGBA1C 7.0 07/30/2017   HGBA1C 7.6 (H) 04/21/2017   Previously on: - Metformin  ER 1000 mg 2x a day (2000 mg with dinner did not change the sugars) >> 1000 mg with breakfast - Glipizide  5 >> 10 mg 2x a day before breakfast and dinner - >> stopped 02/2020 by Dr. Sam We tried Ozempic  but she developed nausea and vomiting and ended up in the emergency room in 11/2018. We again tried Jardiance   - added again 04/2018 >> not affordable Stopped  Glipizide  5 mg 2x a day 06/2017 b/c lows, but restarted afterwards. Tradjenta  and Alogliptin  were not covered She was previously on Invokana , Jardiance  10 mg daily >> too expensive. Tried Cycloset  >> too expensive Tried Januvia  >>  too expensive.  Tried Metformin  500 mg po >> severe diarrhea. She was on Glimepiride  >> hypoglycemia in the 60's on 2 mg daily. She had rapid acting insulin  with steroid shots before.   Now on: - Metformin  ER 500 mg 2-4x a day (occas. diarrhea)  >> 1000 mg in am - Lantus  10-12 units in HS - added back 04/2021 >> .SABRA. 18 >> 22 units daily - NovoLog  4-8 units ... 8-14 units before each meal >> NOT TAKING! >>  6 to 10 units before meals Previously on glipizide  5 mg before a larger dinner. She was previously on Jardiance  but this caused increased urination and vulvar irritation so she stopped. She stopped Trulicity  in 05/2023 due to fatigue. She could not tolerate Ozempic  due to nausea and vomiting.  Pt checks her sugars >4x a day - CGM:  Prev.:  Previously:  Lowest sugar was 56 x1 >> ...91 >> 60s >> 77;  she has hypoglycemia awareness in the 90s >> 70s. Highest sugar was 360 >>...upper 200 >> 400 >> 300s  ReliOn meter.  -+ CKD-sees nephrology., last BUN/creatinine:  Lab Results  Component Value Date   BUN 23 11/26/2023   CREATININE 1.11 11/26/2023   No MAU: Lab Results  Component Value Date   MICRALBCREAT 50.8 (H) 11/26/2023   MICRALBCREAT 4 03/28/2016   MICRALBCREAT 11.4 10/29/2009   MICRALBCREAT 3.4 04/09/2009   MICRALBCREAT 17.1 01/25/2009  06/03/2021 (CCK): <4  GFR: Lab Results  Component Value Date   GFRNONAA 29 (L) 11/25/2018   GFRNONAA 45 (L) 04/21/2017   GFRNONAA 54 (L) 02/27/2017   GFRNONAA 35 (L) 02/17/2017   GFRNONAA 52.79 (L) 06/19/2010  GFRNONAA 59.68 10/29/2009   GFRNONAA 53.52 07/03/2009   GFRNONAA 59.78 04/09/2009   GFRNONAA 59.82 01/25/2009   GFRNONAA 78 05/08/2008  On Lisinopril  10.  -+ HL; last set of lipids: Lab Results  Component Value Date   CHOL 170 11/26/2023   HDL 55.30 11/26/2023   LDLCALC 93 11/26/2023   LDLDIRECT 149.6 01/26/2013   TRIG 113.0 11/26/2023   CHOLHDL 3 11/26/2023  On Lipitor 40 >> 80,  - last eye exam was on 10/28/2023. No DR reportedly. She had cataract surgery OS in 01/2023.  - no numbness and tingling in her feet.  She sees podiatry for ingrown toenails.  Last foot exam 03/16/2024.  She also has a history of HTN. 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  ROS: + See  HPI  I reviewed pt's medications, allergies, PMH, social hx, family hx, and changes were documented in the history of present illness. Otherwise, unchanged from my initial visit note.  Past Medical History:  Diagnosis Date   Diabetes mellitus    Hyperlipidemia    Hypertension    Murmur    Past Surgical History:  Procedure Laterality Date   ABDOMINAL HYSTERECTOMY     BACK SURGERY     CHOLECYSTECTOMY     TONSILLECTOMY     TOTAL KNEE ARTHROPLASTY     TOTAL KNEE ARTHROPLASTY Right 02/26/2017   Procedure: RIGHT TOTAL KNEE ARTHROPLASTY;  Surgeon: Duwayne Purchase, MD;  Location: [facility];  Service: Orthopedics;  Laterality: Right;   Social History   Socioeconomic History   Marital status: Widowed    Spouse name: Not on file   Number of children: Not on file   Years of education: Not on file   Highest education level: Associate degree: academic program  Occupational History   Not on file  Tobacco Use   Smoking status: Never   Smokeless tobacco: Never  Vaping Use   Vaping status: Never Used  Substance and Sexual Activity   Alcohol use: Yes    Comment: rare   Drug use: No   Sexual activity: Not Currently    Partners: Male  Other Topics Concern   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   Caffeine use: coffee in the am   Social Drivers of Health   Financial Resource Strain: Medium Risk (11/25/2023)   Overall Financial Resource Strain (CARDIA)    Difficulty of Paying Living Expenses: Somewhat hard  Food Insecurity: No Food Insecurity (11/25/2023)   Hunger Vital Sign    Worried About Running Out of Food in the Last Year: Never true    Ran Out of Food in the Last Year: Never true  Transportation Needs: No Transportation Needs (11/25/2023)   PRAPARE - Administrator, Civil Service (Medical): No    Lack of Transportation (Non-Medical): No  Physical Activity: Insufficiently Active (11/25/2023)   Exercise Vital Sign    Days of  Exercise per Week: 2 days    Minutes of Exercise per Session: 20 min  Stress: No Stress Concern Present (11/25/2023)   [employer] of Occupational Health - Occupational Stress Questionnaire    Feeling of Stress : Only a little  Social Connections: Moderately Integrated (11/25/2023)   Social Connection and Isolation Panel    Frequency of Communication with Friends and Family: More than three times a week    Frequency of Social Gatherings with Friends and Family: More than three times a week    Attends Religious  Services: More than 4 times per year    Active Member of Clubs or Organizations: Yes    Attends Banker Meetings: 1 to 4 times per year    Marital Status: Widowed  Intimate Partner Violence: Not At Risk (01/13/2023)   Received from [facility]   HITS    Over the last 12 months how often did your partner physically hurt you?: Never    Over the last 12 months how often did your partner insult you or talk down to you?: Never    Over the last 12 months how often did your partner threaten you with physical harm?: Never    Over the last 12 months how often did your partner scream or curse at you?: Never   Current Outpatient Medications on File Prior to Visit  Medication Sig Dispense Refill   acetaminophen  (TYLENOL ) 650 MG CR tablet Take 650 mg by mouth every 8 (eight) hours as needed for pain.     Ascorbic Acid (VITAMIN C) 100 MG tablet Take 100 mg by mouth daily.     aspirin  EC 81 MG tablet Take 81 mg by mouth daily.     atorvastatin  (LIPITOR) 80 MG tablet Take 1 tablet (80 mg total) by mouth daily. 90 tablet 3   cholecalciferol (VITAMIN D3) 25 MCG (1000 UNIT) tablet Take 2,000 Units by mouth daily.     citalopram  (CELEXA ) 40 MG tablet TAKE 1 TABLET BY MOUTH DAILY 90 tablet 3   Continuous Glucose Sensor (FREESTYLE LIBRE 3 PLUS SENSOR) MISC Inject 1 Device into the skin continuous. Change every 15 days 6 each 3   docusate sodium  (COLACE) 100 MG capsule Take 1 capsule  (100 mg total) by mouth 2 (two) times daily as needed for mild constipation. 30 capsule 1   fluticasone  (FLONASE ) 50 MCG/ACT nasal spray Place 2 sprays into both nostrils daily. 16 g 6   glucose blood (ACCU-CHEK GUIDE) test strip USE 1 STRIP TO CHECK GLUCOSE ONCE DAILY 100 each 4   Insulin  Admin Supplies MISC Use to inject insulin  2 times a day. 180 each 0   insulin  aspart (NOVOLOG  FLEXPEN) 100 UNIT/ML FlexPen Inject 8-14 Units into the skin 3 (three) times daily with meals. 30 mL 3   insulin  glargine (LANTUS  SOLOSTAR) 100 UNIT/ML Solostar Pen Inject 22-25 Units into the skin daily. 30 mL 3   Insulin  Pen Needle 32G X 4 MM MISC Use 3x a day     Lancets (ACCU-CHEK MULTICLIX) lancets Use as instructed to test once a day DX E11.51 100 each 1   lisinopril -hydrochlorothiazide  (ZESTORETIC ) 10-12.5 MG tablet TAKE 1 TABLET BY MOUTH DAILY 90 tablet 3   Loratadine -Pseudoephedrine (CLARITIN -D 12 HOUR PO) Take 1 tablet by mouth daily as needed (sinus).      metFORMIN  (GLUCOPHAGE -XR) 500 MG 24 hr tablet TAKE 1 TABLET BY MOUTH TWICE  DAILY 180 tablet 3   Multiple Vitamins-Minerals (MULTIVITAMIN ADULT PO) Take 1 tablet by mouth daily.      traZODone  (DESYREL ) 50 MG tablet Take 0.5-1 tablets (25-50 mg total) by mouth at bedtime as needed. for sleep 90 tablet 3   vitamin B-12 (CYANOCOBALAMIN ) 100 MCG tablet Take 100 mcg by mouth daily.     zinc gluconate 50 MG tablet Take 50 mg by mouth daily.     No current facility-administered medications on file prior to visit.   Allergies  Allergen Reactions   Ozempic  (0.25 Or 0.5 Mg-Dose) [Semaglutide (0.25 Or 0.5mg -Dos)] Diarrhea and Nausea And Vomiting  Pt stated, I got dehydrated from this as well   Family History  Problem Relation Age of Onset   Arthritis Mother    Cirrhosis Father    Alcohol abuse Father    Hypertension Sister    Arthritis Sister    Alzheimer's disease Sister    Throat cancer Brother    Heart disease Brother        MI   Cancer Brother  80       throat,    Hyperlipidemia Brother    Heart disease Brother    Cancer Brother    Cancer Brother    Heart disease Brother    Diabetes Brother    Diabetes Brother    Cancer Brother 50       melanoma   Diabetes Brother    Hypertension Brother    Hyperlipidemia Brother    Melanoma Other    Arthritis Other    PE: BP 134/76   Pulse 60   Resp 20   Ht 5' 2 (1.575 m)   Wt 206 lb 12.8 oz (93.8 kg)   SpO2 94%   BMI 37.82 kg/m  Wt Readings from Last 3 Encounters:  04/29/24 206 lb 12.8 oz (93.8 kg)  03/16/24 207 lb 12.8 oz (94.3 kg)  11/26/23 203 lb (92.1 kg)   Constitutional: overweight, in NAD Eyes:EOMI, no exophthalmos ENT: no thyromegaly, no cervical lymphadenopathy Cardiovascular: RRR, No RG, + 1/6 SEM Respiratory: CTA B Musculoskeletal: no deformities Skin: no rashes Neurological: no tremor with outstretched hands  ASSESSMENT: 1. DM2, insulin -dependent, uncontrolled, with long-term complications: - CKD  2. HL   3. Obesity class II  PLAN:  1. Patient with longstanding, uncontrolled, type 2 diabetes, on oral antidiabetic regimen with metformin  and also long-acting insulin  at last visit, to which we had to add mealtime insulin  on a consistent basis at last visit.  Sugars were quite fluctuating, improving slightly overnight and then increasing very abruptly after lunch and less so after dinner.  I advised her to take the NovoLog  approximately 15 minutes before the meals and adjust the dose based on the size of the meal and the blood sugar patterns.  We did not change the metformin  and Lantus  doses. - I previously considered recommended Mounjaro  but we decided to do this only if sugars did not improve on the above regimen. - We previously discussed about diet including the best route to eat and also to eat carbs at the end rather than at the beginning of the meal. CGM interpretation: -At today's visit, we reviewed her CGM downloads: It appears that 65% of values are  in target range (goal >70%), while 35% are higher than 180 (goal <25%), and 0% are lower than 70 (goal <4%).  The calculated average blood sugar is 169.  The projected HbA1c for the next 3 months (GMI) is 7.4%. -Reviewing the CGM trends, sugars appear to have improved and they are decreasing overnight and then increasing slowly after the first meal of the day but more so after lunch and dinner.  Upon questioning, she is still getting used to injecting the NovoLog  and she has not taken it with her whenever eating out.  Also, she is working on injecting 15 minutes before the meals.  I believe the sugars will continue to improve after she gets more in the habit of injecting this before each meal.  She agrees with this I would not recommend an increase in doses for now. -We did discuss about possibly using  Mounjaro  but she tried to get this and it was too expensive.  She had intolerance to the GLP-1 receptor agonists. - I suggested to:  Patient Instructions  Please continue: - Metformin  1000 mg in am - Lantus  22 units daily at night - NovoLog  15 min before meals: 6-10 units    Please return in 3 months.  - we checked her HbA1c: 7.9% (already improved) - advised to check sugars at different times of the day - 4x a day, rotating check times - advised for yearly eye exams >> she is UTD - return to clinic in 3 months   2. HL - LDL was improved at last check but still above our target of less than 70, Lab Results  Component Value Date   CHOL 170 11/26/2023   HDL 55.30 11/26/2023   LDLCALC 93 11/26/2023   LDLDIRECT 149.6 01/26/2013   TRIG 113.0 11/26/2023   CHOLHDL 3 11/26/2023  - She continues on Lipitor 80 mg daily without side effects.  She was previously on fenofibrate  but came off.  3.  Obesity class II - She not tolerate Ozempic  due to nausea and vomiting and since last visit, she also had to stop Trulicity  due to fatigue. - She gained 12 pounds before the last visit and lost 1 lbs since  then  + Flu shot today  Lela Fendt, MD PhD [facility]

## 2024-05-15 ENCOUNTER — Encounter: Payer: Self-pay | Admitting: Pharmacist

## 2024-05-15 NOTE — Progress Notes (Signed)
 Pharmacy Quality Measure Review  Clinical Pharmacist Practitioner has been following adherence for 2025 for cholesterol (statin), diabetes, and hypertension (ACEi/ARB) medications. Patient failed medication adherence measure for cholesterol medicaiton in 2024.   Medication: atorvastatin   Last fill date: 04/22/2024 for 90 day supply. Patient currently has filled atorvastatin  on time thru 2025  Medication: lisinopril  HCTZ Last fill date: 04/22/2024 for 90 day supply. Patient currently has filled atorvastatin  on time thru 2025  Medication: metformin   Last fill date: 04/22/2024 for 90 day supply - not included in diabetes measure since she also takes insulin .    No intervention at this time since she filled all 3 medications recently.  She has no refills remaining for atorvastatin , 1 refill remaining for metformin  and 2 refills remaining for lisinopril -hydrochlorothiazide .  No current follow up scheduled with PCP but patient will see endo in September 2025.  Previously PCP recommended follow up in 6 months. Scheduler sent patient a MyChart message but looks like patient did not see if. See below.  Me to Lowne Chase, Yvonne R, DO /   03/02/24  8:30 PM Patient was last seen 11/27/2023. No follow up currently scheduled. Would you want to see her in 6 months? Around 05/2024?  Antonio Cyndee Jamee JONELLE, DO to Me / 03/03/24  8:34 AM Yes 6 month f/u  Me to Neysa Comer NOVAK / 03/03/24  8:54 AM Izetta - Will you reach out to this patient to scheduled a follow up in November with Dr Cruz?  Thanks! Madelin Neysa Comer B to [employer] /  03/03/24 11:52 AM Hello, you are due for a follow up with Dr.Lowne. Please call us  back at [phone]!  This MyChart message has not been read.  Will forward request to call patient again to try to schedule her for follow up with PCP.   Madelin Ray, PharmD Clinical Pharmacist [facility]

## 2024-05-16 NOTE — Progress Notes (Signed)
 Got her scheduled

## 2024-05-24 ENCOUNTER — Ambulatory Visit: Admitting: Family Medicine

## 2024-05-24 ENCOUNTER — Encounter: Payer: Self-pay | Admitting: Family Medicine

## 2024-05-24 VITALS — BP 142/68 | HR 57 | Temp 98.2°F | Resp 16 | Ht 62.0 in | Wt 210.0 lb

## 2024-05-24 DIAGNOSIS — E782 Mixed hyperlipidemia: Secondary | ICD-10-CM

## 2024-05-24 DIAGNOSIS — I10 Essential (primary) hypertension: Secondary | ICD-10-CM | POA: Diagnosis not present

## 2024-05-24 DIAGNOSIS — E1122 Type 2 diabetes mellitus with diabetic chronic kidney disease: Secondary | ICD-10-CM

## 2024-05-24 DIAGNOSIS — E538 Deficiency of other specified B group vitamins: Secondary | ICD-10-CM | POA: Diagnosis not present

## 2024-05-24 DIAGNOSIS — N1832 Chronic kidney disease, stage 3b: Secondary | ICD-10-CM

## 2024-05-24 DIAGNOSIS — E559 Vitamin D deficiency, unspecified: Secondary | ICD-10-CM | POA: Diagnosis not present

## 2024-05-24 DIAGNOSIS — R7309 Other abnormal glucose: Secondary | ICD-10-CM

## 2024-05-24 DIAGNOSIS — I700 Atherosclerosis of aorta: Secondary | ICD-10-CM | POA: Diagnosis not present

## 2024-05-24 DIAGNOSIS — E1142 Type 2 diabetes mellitus with diabetic polyneuropathy: Secondary | ICD-10-CM

## 2024-05-24 LAB — COMPREHENSIVE METABOLIC PANEL WITH GFR
ALT: 13 U/L (ref 0–35)
AST: 15 U/L (ref 0–37)
Albumin: 3.9 g/dL (ref 3.5–5.2)
Alkaline Phosphatase: 87 U/L (ref 39–117)
BUN: 23 mg/dL (ref 6–23)
CO2: 27 meq/L (ref 19–32)
Calcium: 9 mg/dL (ref 8.4–10.5)
Chloride: 102 meq/L (ref 96–112)
Creatinine, Ser: 1.2 mg/dL (ref 0.40–1.20)
GFR: 43.89 mL/min — ABNORMAL LOW (ref >60.00)
Glucose, Bld: 122 mg/dL — ABNORMAL HIGH (ref 70–99)
Potassium: 5 meq/L (ref 3.5–5.1)
Sodium: 135 meq/L (ref 135–145)
Total Bilirubin: 0.6 mg/dL (ref 0.2–1.2)
Total Protein: 6.5 g/dL (ref 6.0–8.3)

## 2024-05-24 LAB — CBC WITH DIFFERENTIAL/PLATELET
Basophils Absolute: 0 K/uL (ref 0.0–0.1)
Basophils Relative: 0.7 % (ref 0.0–3.0)
Eosinophils Absolute: 0.2 K/uL (ref 0.0–0.7)
Eosinophils Relative: 3.3 % (ref 0.0–5.0)
HCT: 34.8 % — ABNORMAL LOW (ref 36.0–46.0)
Hemoglobin: 11.7 g/dL — ABNORMAL LOW (ref 12.0–15.0)
Lymphocytes Relative: 21.4 % (ref 12.0–46.0)
Lymphs Abs: 1.4 K/uL (ref 0.7–4.0)
MCHC: 33.6 g/dL (ref 30.0–36.0)
MCV: 87.7 fl (ref 78.0–100.0)
Monocytes Absolute: 0.4 K/uL (ref 0.1–1.0)
Monocytes Relative: 5.9 % (ref 3.0–12.0)
Neutro Abs: 4.4 K/uL (ref 1.4–7.7)
Neutrophils Relative %: 68.7 % (ref 43.0–77.0)
Platelets: 217 K/uL (ref 150.0–400.0)
RBC: 3.96 Mil/uL (ref 3.87–5.11)
RDW: 14.6 % (ref 11.5–15.5)
WBC: 6.4 K/uL (ref 4.0–10.5)

## 2024-05-24 LAB — LIPID PANEL
Cholesterol: 158 mg/dL (ref 0–200)
HDL: 46.5 mg/dL (ref >39.00)
LDL Cholesterol: 92 mg/dL (ref 0–99)
NonHDL: 111.84
Total CHOL/HDL Ratio: 3
Triglycerides: 100 mg/dL (ref 0.0–149.0)
VLDL: 20 mg/dL (ref 0.0–40.0)

## 2024-05-24 LAB — HEMOGLOBIN A1C: Hgb A1c MFr Bld: 8.3 % — ABNORMAL HIGH (ref 4.6–6.5)

## 2024-05-24 LAB — VITAMIN B12: Vitamin B-12: 556 pg/mL (ref 211–911)

## 2024-05-24 LAB — TSH: TSH: 4.05 u[IU]/mL (ref 0.35–5.50)

## 2024-05-24 LAB — VITAMIN D 25 HYDROXY (VIT D DEFICIENCY, FRACTURES): VITD: 36.58 ng/mL (ref 30.00–100.00)

## 2024-05-24 NOTE — Progress Notes (Signed)
 Subjective:    Patient ID: Bethany Miller, female    DOB: 1946/06/11, 78 y.o.   MRN: [id number]  Chief Complaint  Patient presents with   Hypertension   Hyperlipidemia   Follow-up    HPI Patient is in today for f/u bp, chol and dm.   Discussed the use of AI scribe software for clinical note transcription with the patient, who gave verbal consent to proceed.  History of Present Illness Bethany Miller is a 78 year old female who presents with concerns about splitting and peeling fingernails and possible anemia.  She has noticed her fingernails splitting and peeling, a new issue for her as she previously had healthy nails. Despite taking B vitamins, she is concerned about a potential nutrient deficiency. The condition has worsened to the point where her nails are down to the nubs, making it difficult to pick up objects.  She is worried about potential anemia, noting that the area under her eyes appears white rather than pink, which she believes might indicate a lack of color. She requests to have her iron levels checked.  She reports that she missed her blood pressure medication for two days and attributes her elevated blood pressure at the visit to rushing to the appointment and missing her medication. She usually monitors her blood pressure at home and reports it is typically well-controlled.  She mentions concerns about her memory, feeling that her short-term memory is not as good as it used to be. She describes it as having 'so much going on' and feeling like she doesn't have enough 'storage' in her mind. She jokes about running out of space and not remembering things that aren't worth remembering.  She is concerned about weight gain, noting an increase to 210 pounds, which she attributes to possibly being less physically active. She has been seeing Dr. Everardo for her blood sugar management, which she reports is improving.  She takes magnesium  and vitamin C regularly to maintain her  health. 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    Past Medical History:  Diagnosis Date   Diabetes mellitus    Hyperlipidemia    Hypertension    Murmur     Past Surgical History:  Procedure Laterality Date   ABDOMINAL HYSTERECTOMY     BACK SURGERY     CHOLECYSTECTOMY     TONSILLECTOMY     TOTAL KNEE ARTHROPLASTY     TOTAL KNEE ARTHROPLASTY Right 02/26/2017   Procedure: RIGHT TOTAL KNEE ARTHROPLASTY;  Surgeon: Duwayne Purchase, MD;  Location: [facility];  Service: Orthopedics;  Laterality: Right;    Family History  Problem Relation Age of Onset   Arthritis Mother    Cirrhosis Father    Alcohol abuse Father    Hypertension Sister    Arthritis Sister    Alzheimer's disease Sister    Throat cancer Brother    Heart disease Brother        MI   Cancer Brother 80       throat,    Hyperlipidemia Brother    Heart disease Brother    Cancer Brother    Cancer Brother    Heart disease Brother    Diabetes Brother    Diabetes Brother    Cancer Brother 50       melanoma   Diabetes Brother    Hypertension Brother    Hyperlipidemia  Brother    Melanoma Other    Arthritis Other     Social History   Socioeconomic History   Marital status: Widowed    Spouse name: Not on file   Number of children: Not on file   Years of education: Not on file   Highest education level: Some college, no degree  Occupational History   Not on file  Tobacco Use   Smoking status: Never   Smokeless tobacco: Never  Vaping Use   Vaping status: Never Used  Substance and Sexual Activity   Alcohol use: Yes    Comment: rare   Drug use: No   Sexual activity: Not Currently    Partners: Male  Other Topics Concern   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   Caffeine use: coffee in the am   Social Drivers of Health   Financial  Resource Strain: Low Risk  (05/23/2024)   Overall Financial Resource Strain (CARDIA)    Difficulty of Paying Living Expenses: Not very hard  Food Insecurity: No Food Insecurity (05/23/2024)   Hunger Vital Sign    Worried About Running Out of Food in the Last Year: Never true    Ran Out of Food in the Last Year: Never true  Transportation Needs: No Transportation Needs (05/23/2024)   PRAPARE - Administrator, Civil Service (Medical): No    Lack of Transportation (Non-Medical): No  Physical Activity: Insufficiently Active (05/23/2024)   Exercise Vital Sign    Days of Exercise per Week: 2 days    Minutes of Exercise per Session: 30 min  Stress: Stress Concern Present (05/23/2024)   [employer] of Occupational Health - Occupational Stress Questionnaire    Feeling of Stress: To some extent  Social Connections: Moderately Integrated (05/23/2024)   Social Connection and Isolation Panel    Frequency of Communication with Friends and Family: More than three times a week    Frequency of Social Gatherings with Friends and Family: More than three times a week    Attends Religious Services: More than 4 times per year    Active Member of [employer] or Organizations: Yes    Attends Banker Meetings: 1 to 4 times per year    Marital Status: Widowed  Intimate Partner Violence: Not At Risk (01/13/2023)   Received [facility]   HITS    Over the last 12 months how often did your partner physically hurt you?: Never    Over the last 12 months how often did your partner insult you or talk down to you?: Never    Over the last 12 months how often did your partner threaten you with physical harm?: Never    Over the last 12 months how often did your partner scream or curse at you?: Never    Outpatient Medications Prior to Visit  Medication Sig Dispense Refill   acetaminophen  (TYLENOL ) 650 MG CR tablet Take 650 mg by mouth every 8 (eight) hours as needed for pain.     Ascorbic  Acid (VITAMIN C) 100 MG tablet Take 100 mg by mouth daily.     aspirin  EC 81 MG tablet Take 81 mg by mouth daily.     atorvastatin  (LIPITOR) 80 MG tablet Take 1 tablet (80 mg total) by mouth daily. 90 tablet 3   cholecalciferol (VITAMIN D3) 25 MCG (1000 UNIT) tablet Take 2,000 Units by mouth daily.     citalopram  (CELEXA )  40 MG tablet TAKE 1 TABLET BY MOUTH DAILY 90 tablet 3   Continuous Glucose Sensor (FREESTYLE LIBRE 3 PLUS SENSOR) MISC Inject 1 Device into the skin continuous. Change every 15 days 6 each 3   docusate sodium  (COLACE) 100 MG capsule Take 1 capsule (100 mg total) by mouth 2 (two) times daily as needed for mild constipation. 30 capsule 1   fluticasone  (FLONASE ) 50 MCG/ACT nasal spray Place 2 sprays into both nostrils daily. 16 g 6   glucose blood (ACCU-CHEK GUIDE) test strip USE 1 STRIP TO CHECK GLUCOSE ONCE DAILY 100 each 4   Insulin  Admin Supplies MISC Use to inject insulin  2 times a day. 180 each 0   insulin  aspart (NOVOLOG  FLEXPEN) 100 UNIT/ML FlexPen Inject 8-14 Units into the skin 3 (three) times daily with meals. 30 mL 3   insulin  glargine (LANTUS  SOLOSTAR) 100 UNIT/ML Solostar Pen Inject 22-25 Units into the skin daily. 30 mL 3   Insulin  Pen Needle 32G X 4 MM MISC Use 3x a day     Lancets (ACCU-CHEK MULTICLIX) lancets Use as instructed to test once a day DX E11.51 100 each 1   lisinopril -hydrochlorothiazide  (ZESTORETIC ) 10-12.5 MG tablet TAKE 1 TABLET BY MOUTH DAILY 90 tablet 3   Loratadine -Pseudoephedrine (CLARITIN -D 12 HOUR PO) Take 1 tablet by mouth daily as needed (sinus).      metFORMIN  (GLUCOPHAGE -XR) 500 MG 24 hr tablet TAKE 1 TABLET BY MOUTH TWICE  DAILY 180 tablet 3   Multiple Vitamins-Minerals (MULTIVITAMIN ADULT PO) Take 1 tablet by mouth daily.      traZODone  (DESYREL ) 50 MG tablet Take 0.5-1 tablets (25-50 mg total) by mouth at bedtime as needed. for sleep 90 tablet 3   vitamin B-12 (CYANOCOBALAMIN ) 100 MCG tablet Take 100 mcg by mouth daily.     zinc  gluconate 50 MG tablet Take 50 mg by mouth daily.     No facility-administered medications prior to visit.    Allergies  Allergen Reactions   Ozempic  (0.25 Or 0.5 Mg-Dose) [Semaglutide (0.25 Or 0.5mg -Dos)] Diarrhea and Nausea And Vomiting    Pt stated, I got dehydrated from this as well    Review of Systems  Constitutional:  Negative for chills, fever and malaise/fatigue.  HENT:  Negative for congestion and hearing loss.   Eyes:  Negative for discharge.  Respiratory:  Negative for cough, sputum production and shortness of breath.   Cardiovascular:  Negative for chest pain, palpitations and leg swelling.  Gastrointestinal:  Negative for abdominal pain, blood in stool, constipation, diarrhea, heartburn, nausea and vomiting.  Genitourinary:  Negative for dysuria, frequency, hematuria and urgency.  Musculoskeletal:  Negative for back pain, falls and myalgias.  Skin:  Negative for rash.  Neurological:  Negative for dizziness, sensory change, loss of consciousness, weakness and headaches.  Endo/Heme/Allergies:  Negative for environmental allergies. Does not bruise/bleed easily.  Psychiatric/Behavioral:  Negative for depression and suicidal ideas. The patient is not nervous/anxious and does not have insomnia.        Objective:    Physical Exam Vitals and nursing note reviewed.  Constitutional:      General: She is not in acute distress.    Appearance: Normal appearance. She is well-developed.  HENT:     Head: Normocephalic and atraumatic.  Eyes:     General: No scleral icterus.       Right eye: No discharge.        Left eye: No discharge.  Cardiovascular:     Rate and Rhythm: Normal  rate and regular rhythm.     Heart sounds: No murmur heard. Pulmonary:     Effort: Pulmonary effort is normal. No respiratory distress.     Breath sounds: Normal breath sounds.  Musculoskeletal:        General: Normal range of motion.     Cervical back: Normal range of motion and neck supple.      Right lower leg: No edema.     Left lower leg: No edema.  Skin:    General: Skin is warm and dry.  Neurological:     Mental Status: She is alert and oriented to person, place, and time.  Psychiatric:        Mood and Affect: Mood normal.        Behavior: Behavior normal.        Thought Content: Thought content normal.        Judgment: Judgment normal.     BP (!) 142/68 (BP Location: Right Arm, Patient Position: Sitting, Cuff Size: Large)   Pulse (!) 57   Temp 98.2 F (36.8 C) (Oral)   Resp 16   Ht 5' 2 (1.575 m)   Wt 210 lb (95.3 kg)   SpO2 96%   BMI 38.41 kg/m  Wt Readings from Last 3 Encounters:  05/24/24 210 lb (95.3 kg)  04/29/24 206 lb 12.8 oz (93.8 kg)  03/16/24 207 lb 12.8 oz (94.3 kg)    Diabetic Foot Exam - Simple   No data filed    Lab Results  Component Value Date   WBC 6.8 11/26/2023   HGB 12.2 11/26/2023   HCT 36.6 11/26/2023   PLT 235.0 11/26/2023   GLUCOSE 107 (H) 11/26/2023   CHOL 170 11/26/2023   TRIG 113.0 11/26/2023   HDL 55.30 11/26/2023   LDLDIRECT 149.6 01/26/2013   LDLCALC 93 11/26/2023   ALT 13 11/26/2023   AST 16 11/26/2023   NA 138 11/26/2023   K 4.0 11/26/2023   CL 104 11/26/2023   CREATININE 1.11 11/26/2023   BUN 23 11/26/2023   CO2 25 11/26/2023   TSH 3.01 11/14/2021   INR 0.96 02/17/2017   HGBA1C 7.9 (A) 04/29/2024   MICROALBUR 6.2 (H) 11/26/2023    Lab Results  Component Value Date   TSH 3.01 11/14/2021   Lab Results  Component Value Date   WBC 6.8 11/26/2023   HGB 12.2 11/26/2023   HCT 36.6 11/26/2023   MCV 86.2 11/26/2023   PLT 235.0 11/26/2023   Lab Results  Component Value Date   NA 138 11/26/2023   K 4.0 11/26/2023   CO2 25 11/26/2023   GLUCOSE 107 (H) 11/26/2023   BUN 23 11/26/2023   CREATININE 1.11 11/26/2023   BILITOT 0.5 11/26/2023   ALKPHOS 88 11/26/2023   AST 16 11/26/2023   ALT 13 11/26/2023   PROT 6.7 11/26/2023   ALBUMIN 4.1 11/26/2023   CALCIUM  9.0 11/26/2023   ANIONGAP 13 11/25/2018    GFR 48.37 (L) 11/26/2023   Lab Results  Component Value Date   CHOL 170 11/26/2023   Lab Results  Component Value Date   HDL 55.30 11/26/2023   Lab Results  Component Value Date   LDLCALC 93 11/26/2023   Lab Results  Component Value Date   TRIG 113.0 11/26/2023   Lab Results  Component Value Date   CHOLHDL 3 11/26/2023   Lab Results  Component Value Date   HGBA1C 7.9 (A) 04/29/2024       Assessment & Plan:  Essential  hypertension -     CBC with Differential/Platelet -     Comprehensive metabolic panel with GFR -     TSH  Mixed hyperlipidemia -     Comprehensive metabolic panel with GFR -     Lipid panel  Aortic atherosclerosis  Vitamin D  deficiency -     VITAMIN D  25 Hydroxy (Vit-D Deficiency, Fractures)  B12 deficiency -     Vitamin B12  Type 2 diabetes mellitus with stage 3b chronic kidney disease, without long-term current use of insulin  (HCC) -     Comprehensive metabolic panel with GFR -     Hemoglobin A1c  Essential hypertension, benign Assessment & Plan: Well controlled, no changes to meds. Encouraged heart healthy diet such as the DASH diet and exercise as tolerated.      HYPERGLYCEMIA, FASTING Assessment & Plan: Check labs    Type 2 diabetes mellitus with diabetic polyneuropathy, without long-term current use of insulin  (HCC) Assessment & Plan: Per endo     Assessment and Plan Assessment & Plan Essential hypertension   Blood pressure was elevated during the visit, likely due to rushing and missing medication for two days, but it has been well-controlled at home. Continue current antihypertensive medication regimen.  Type 2 diabetes mellitus   Blood sugar levels have improved under Dr. Felisa care, although there is weight gain. Continue current diabetes management plan with Dr. Everardo and monitor blood sugar levels regularly.  Weight gain   Weight has increased by three pounds since September, now at 210 pounds. She is  concerned about weight gain and inquired about weight loss medications. Discussed potential changes in medication costs in January due to new agreements with pharmaceutical companies. Discuss weight management strategies with Dr. Everardo and consider potential changes in medication costs in January.  Deficiency of B group vitamins   Reports splitting and peeling of fingernails, possibly related to B vitamin deficiency. Continue B vitamin supplementation.  General Health Maintenance   Received flu shot. Discussed RSV and pneumonia vaccinations. She is due for a pneumonia shot but is unsure if it was received at the pharmacy. Discussed COVID vaccination, but she is not interested. Check with pharmacy to confirm pneumonia vaccination status and consider vaccination if not received.   Lakshya Mcgillicuddy R Lowne Chase, DO

## 2024-05-24 NOTE — Assessment & Plan Note (Signed)
 Per endo

## 2024-05-24 NOTE — Assessment & Plan Note (Signed)
 Check labs

## 2024-05-24 NOTE — Assessment & Plan Note (Signed)
 Well controlled, no changes to meds. Encouraged heart healthy diet such as the DASH diet and exercise as tolerated.

## 2024-05-26 ENCOUNTER — Telehealth: Payer: Self-pay | Admitting: Family Medicine

## 2024-05-26 NOTE — Telephone Encounter (Signed)
 Awaiting for PCP to result labs.

## 2024-05-26 NOTE — Telephone Encounter (Signed)
 Copied from CRM #[id number]. Topic: Clinical - Lab/Test Results >> May 26, 2024  1:33 PM Olam RAMAN wrote: Reason for CRM: pt is asking for a f/u on lab results if she is anemic. Cb  [phone] (M)

## 2024-05-27 ENCOUNTER — Ambulatory Visit: Payer: Self-pay | Admitting: Family Medicine

## 2024-05-27 ENCOUNTER — Encounter: Payer: Self-pay | Admitting: Family Medicine

## 2024-05-28 ENCOUNTER — Other Ambulatory Visit: Payer: Self-pay | Admitting: Family Medicine

## 2024-05-28 DIAGNOSIS — G4700 Insomnia, unspecified: Secondary | ICD-10-CM

## 2024-08-02 ENCOUNTER — Ambulatory Visit: Admitting: Internal Medicine

## 2024-08-04 ENCOUNTER — Other Ambulatory Visit: Payer: Self-pay | Admitting: Internal Medicine

## 2024-08-31 ENCOUNTER — Ambulatory Visit: Admitting: Internal Medicine

## 2024-09-26 ENCOUNTER — Ambulatory Visit: Admitting: Internal Medicine

## 2024-09-28 ENCOUNTER — Ambulatory Visit: Admitting: Internal Medicine
